# Patient Record
Sex: Female | Born: 1940 | Race: White | Hispanic: No | Marital: Married | Smoking: Former smoker
Health system: Southern US, Community
[De-identification: ages and names within clinical notes are randomized; demographics above are authoritative.]

## PROBLEM LIST (undated history)

## (undated) DIAGNOSIS — T451X5A Adverse effect of antineoplastic and immunosuppressive drugs, initial encounter: Secondary | ICD-10-CM

## (undated) DIAGNOSIS — F0390 Unspecified dementia without behavioral disturbance: Secondary | ICD-10-CM

## (undated) DIAGNOSIS — F329 Major depressive disorder, single episode, unspecified: Secondary | ICD-10-CM

## (undated) DIAGNOSIS — R0609 Other forms of dyspnea: Secondary | ICD-10-CM

## (undated) DIAGNOSIS — J302 Other seasonal allergic rhinitis: Secondary | ICD-10-CM

## (undated) DIAGNOSIS — F32A Depression, unspecified: Secondary | ICD-10-CM

## (undated) DIAGNOSIS — D649 Anemia, unspecified: Secondary | ICD-10-CM

## (undated) DIAGNOSIS — E611 Iron deficiency: Secondary | ICD-10-CM

## (undated) DIAGNOSIS — E119 Type 2 diabetes mellitus without complications: Secondary | ICD-10-CM

## (undated) DIAGNOSIS — Z973 Presence of spectacles and contact lenses: Secondary | ICD-10-CM

## (undated) DIAGNOSIS — C801 Malignant (primary) neoplasm, unspecified: Secondary | ICD-10-CM

## (undated) DIAGNOSIS — M1990 Unspecified osteoarthritis, unspecified site: Secondary | ICD-10-CM

## (undated) DIAGNOSIS — I219 Acute myocardial infarction, unspecified: Secondary | ICD-10-CM

## (undated) DIAGNOSIS — Z974 Presence of external hearing-aid: Secondary | ICD-10-CM

## (undated) DIAGNOSIS — H3530 Unspecified macular degeneration: Secondary | ICD-10-CM

## (undated) DIAGNOSIS — I10 Essential (primary) hypertension: Secondary | ICD-10-CM

## (undated) DIAGNOSIS — I7090 Unspecified atherosclerosis: Secondary | ICD-10-CM

## (undated) DIAGNOSIS — G620 Drug-induced polyneuropathy: Secondary | ICD-10-CM

## (undated) DIAGNOSIS — N3946 Mixed incontinence: Secondary | ICD-10-CM

## (undated) DIAGNOSIS — Z794 Long term (current) use of insulin: Secondary | ICD-10-CM

## (undated) DIAGNOSIS — Z862 Personal history of diseases of the blood and blood-forming organs and certain disorders involving the immune mechanism: Secondary | ICD-10-CM

## (undated) DIAGNOSIS — G629 Polyneuropathy, unspecified: Secondary | ICD-10-CM

## (undated) DIAGNOSIS — E785 Hyperlipidemia, unspecified: Secondary | ICD-10-CM

## (undated) DIAGNOSIS — F39 Unspecified mood [affective] disorder: Secondary | ICD-10-CM

## (undated) DIAGNOSIS — Z86018 Personal history of other benign neoplasm: Secondary | ICD-10-CM

## (undated) DIAGNOSIS — Z9889 Other specified postprocedural states: Secondary | ICD-10-CM

## (undated) DIAGNOSIS — J45909 Unspecified asthma, uncomplicated: Secondary | ICD-10-CM

## (undated) DIAGNOSIS — N3281 Overactive bladder: Secondary | ICD-10-CM

## (undated) DIAGNOSIS — C50919 Malignant neoplasm of unspecified site of unspecified female breast: Secondary | ICD-10-CM

## (undated) DIAGNOSIS — K219 Gastro-esophageal reflux disease without esophagitis: Secondary | ICD-10-CM

## (undated) DIAGNOSIS — R0600 Dyspnea, unspecified: Secondary | ICD-10-CM

## (undated) DIAGNOSIS — I2510 Atherosclerotic heart disease of native coronary artery without angina pectoris: Secondary | ICD-10-CM

## (undated) HISTORY — DX: Type 2 diabetes mellitus without complications: E11.9

## (undated) HISTORY — DX: Other specified postprocedural states: Z98.890

## (undated) HISTORY — DX: Depression, unspecified: F32.A

## (undated) HISTORY — DX: Hyperlipidemia, unspecified: E78.5

## (undated) HISTORY — DX: Gastro-esophageal reflux disease without esophagitis: K21.9

## (undated) HISTORY — DX: Major depressive disorder, single episode, unspecified: F32.9

## (undated) HISTORY — DX: Atherosclerotic heart disease of native coronary artery without angina pectoris: I25.10

## (undated) HISTORY — DX: Acute myocardial infarction, unspecified: I21.9

## (undated) HISTORY — DX: Essential (primary) hypertension: I10

## (undated) HISTORY — DX: Other forms of dyspnea: R06.09

## (undated) HISTORY — DX: Iron deficiency: E61.1

## (undated) HISTORY — DX: Unspecified atherosclerosis: I70.90

## (undated) HISTORY — DX: Malignant neoplasm of unspecified site of unspecified female breast: C50.919

---

## 1975-09-07 HISTORY — PX: TUBAL LIGATION: SHX77

## 1993-09-06 HISTORY — PX: EXCISION OF BREAST BIOPSY: SHX5822

## 1993-09-06 HISTORY — PX: BREAST SURGERY: SHX581

## 1995-09-07 DIAGNOSIS — Z853 Personal history of malignant neoplasm of breast: Secondary | ICD-10-CM

## 1995-09-07 DIAGNOSIS — Z923 Personal history of irradiation: Secondary | ICD-10-CM

## 1995-09-07 DIAGNOSIS — C50919 Malignant neoplasm of unspecified site of unspecified female breast: Secondary | ICD-10-CM

## 1995-09-07 DIAGNOSIS — Z9221 Personal history of antineoplastic chemotherapy: Secondary | ICD-10-CM

## 1995-09-07 HISTORY — PX: MASTECTOMY: SHX3

## 1995-09-07 HISTORY — DX: Personal history of irradiation: Z92.3

## 1995-09-07 HISTORY — PX: MASTECTOMY PARTIAL / LUMPECTOMY W/ AXILLARY LYMPHADENECTOMY: SUR852

## 1995-09-07 HISTORY — DX: Malignant neoplasm of unspecified site of unspecified female breast: C50.919

## 1995-09-07 HISTORY — DX: Personal history of antineoplastic chemotherapy: Z92.21

## 1995-09-07 HISTORY — DX: Personal history of malignant neoplasm of breast: Z85.3

## 1999-08-17 ENCOUNTER — Encounter: Admission: RE | Admit: 1999-08-17 | Discharge: 1999-08-17 | Payer: Self-pay | Admitting: Hematology and Oncology

## 1999-08-17 ENCOUNTER — Encounter: Payer: Self-pay | Admitting: Hematology and Oncology

## 2000-02-05 ENCOUNTER — Encounter: Payer: Self-pay | Admitting: Hematology and Oncology

## 2000-02-05 ENCOUNTER — Encounter: Admission: RE | Admit: 2000-02-05 | Discharge: 2000-02-05 | Payer: Self-pay | Admitting: Hematology and Oncology

## 2000-08-03 ENCOUNTER — Ambulatory Visit (HOSPITAL_COMMUNITY): Admission: RE | Admit: 2000-08-03 | Discharge: 2000-08-03 | Payer: Self-pay | Admitting: Gastroenterology

## 2000-08-12 ENCOUNTER — Encounter: Payer: Self-pay | Admitting: Hematology and Oncology

## 2000-08-12 ENCOUNTER — Encounter: Admission: RE | Admit: 2000-08-12 | Discharge: 2000-08-12 | Payer: Self-pay | Admitting: Hematology and Oncology

## 2000-08-15 ENCOUNTER — Encounter: Payer: Self-pay | Admitting: Hematology and Oncology

## 2000-08-15 ENCOUNTER — Encounter: Admission: RE | Admit: 2000-08-15 | Discharge: 2000-08-15 | Payer: Self-pay | Admitting: Hematology and Oncology

## 2001-08-23 ENCOUNTER — Encounter: Payer: Self-pay | Admitting: Hematology and Oncology

## 2001-08-23 ENCOUNTER — Encounter: Admission: RE | Admit: 2001-08-23 | Discharge: 2001-08-23 | Payer: Self-pay | Admitting: Hematology and Oncology

## 2002-04-03 ENCOUNTER — Ambulatory Visit (HOSPITAL_COMMUNITY): Admission: RE | Admit: 2002-04-03 | Discharge: 2002-04-03 | Payer: Self-pay | Admitting: Internal Medicine

## 2002-08-27 ENCOUNTER — Encounter: Admission: RE | Admit: 2002-08-27 | Discharge: 2002-08-27 | Payer: Self-pay | Admitting: Internal Medicine

## 2002-08-27 ENCOUNTER — Encounter: Payer: Self-pay | Admitting: Internal Medicine

## 2004-05-29 ENCOUNTER — Encounter: Admission: RE | Admit: 2004-05-29 | Discharge: 2004-05-29 | Payer: Self-pay | Admitting: Internal Medicine

## 2004-05-29 ENCOUNTER — Encounter (INDEPENDENT_AMBULATORY_CARE_PROVIDER_SITE_OTHER): Payer: Self-pay | Admitting: *Deleted

## 2005-07-28 ENCOUNTER — Encounter: Admission: RE | Admit: 2005-07-28 | Discharge: 2005-07-28 | Payer: Self-pay | Admitting: Surgery

## 2005-07-28 ENCOUNTER — Encounter (INDEPENDENT_AMBULATORY_CARE_PROVIDER_SITE_OTHER): Payer: Self-pay | Admitting: Specialist

## 2006-02-16 ENCOUNTER — Encounter: Admission: RE | Admit: 2006-02-16 | Discharge: 2006-02-16 | Payer: Self-pay | Admitting: Internal Medicine

## 2006-06-15 ENCOUNTER — Encounter: Admission: RE | Admit: 2006-06-15 | Discharge: 2006-06-15 | Payer: Self-pay | Admitting: Internal Medicine

## 2008-09-06 DIAGNOSIS — I219 Acute myocardial infarction, unspecified: Secondary | ICD-10-CM

## 2008-09-06 HISTORY — DX: Acute myocardial infarction, unspecified: I21.9

## 2008-12-05 DIAGNOSIS — I2510 Atherosclerotic heart disease of native coronary artery without angina pectoris: Secondary | ICD-10-CM

## 2008-12-05 HISTORY — DX: Atherosclerotic heart disease of native coronary artery without angina pectoris: I25.10

## 2008-12-14 ENCOUNTER — Inpatient Hospital Stay (HOSPITAL_COMMUNITY): Admission: EM | Admit: 2008-12-14 | Discharge: 2008-12-18 | Payer: Self-pay | Admitting: Cardiovascular Disease

## 2008-12-14 ENCOUNTER — Encounter: Payer: Self-pay | Admitting: Emergency Medicine

## 2008-12-14 DIAGNOSIS — I219 Acute myocardial infarction, unspecified: Secondary | ICD-10-CM

## 2008-12-14 DIAGNOSIS — I252 Old myocardial infarction: Secondary | ICD-10-CM

## 2008-12-14 HISTORY — DX: Old myocardial infarction: I25.2

## 2008-12-14 HISTORY — DX: Acute myocardial infarction, unspecified: I21.9

## 2008-12-16 DIAGNOSIS — Z9889 Other specified postprocedural states: Secondary | ICD-10-CM

## 2008-12-16 DIAGNOSIS — I7090 Unspecified atherosclerosis: Secondary | ICD-10-CM

## 2008-12-16 DIAGNOSIS — Z955 Presence of coronary angioplasty implant and graft: Secondary | ICD-10-CM

## 2008-12-16 HISTORY — PX: CORONARY ANGIOPLASTY WITH STENT PLACEMENT: SHX49

## 2008-12-16 HISTORY — DX: Other specified postprocedural states: Z98.890

## 2008-12-16 HISTORY — DX: Presence of coronary angioplasty implant and graft: Z95.5

## 2008-12-16 HISTORY — DX: Unspecified atherosclerosis: I70.90

## 2009-01-02 ENCOUNTER — Encounter (HOSPITAL_COMMUNITY): Admission: RE | Admit: 2009-01-02 | Discharge: 2009-04-02 | Payer: Self-pay | Admitting: Cardiovascular Disease

## 2009-04-03 ENCOUNTER — Encounter (HOSPITAL_COMMUNITY): Admission: RE | Admit: 2009-04-03 | Discharge: 2009-05-23 | Payer: Self-pay | Admitting: Cardiovascular Disease

## 2009-08-13 DIAGNOSIS — I10 Essential (primary) hypertension: Secondary | ICD-10-CM | POA: Insufficient documentation

## 2010-09-26 ENCOUNTER — Encounter: Payer: Self-pay | Admitting: Internal Medicine

## 2010-12-13 LAB — GLUCOSE, CAPILLARY
Glucose-Capillary: 110 mg/dL — ABNORMAL HIGH (ref 70–99)
Glucose-Capillary: 135 mg/dL — ABNORMAL HIGH (ref 70–99)
Glucose-Capillary: 68 mg/dL — ABNORMAL LOW (ref 70–99)
Glucose-Capillary: 92 mg/dL (ref 70–99)

## 2010-12-14 LAB — GLUCOSE, CAPILLARY: Glucose-Capillary: 122 mg/dL — ABNORMAL HIGH (ref 70–99)

## 2010-12-15 LAB — GLUCOSE, CAPILLARY
Glucose-Capillary: 104 mg/dL — ABNORMAL HIGH (ref 70–99)
Glucose-Capillary: 125 mg/dL — ABNORMAL HIGH (ref 70–99)
Glucose-Capillary: 133 mg/dL — ABNORMAL HIGH (ref 70–99)
Glucose-Capillary: 145 mg/dL — ABNORMAL HIGH (ref 70–99)
Glucose-Capillary: 149 mg/dL — ABNORMAL HIGH (ref 70–99)
Glucose-Capillary: 68 mg/dL — ABNORMAL LOW (ref 70–99)
Glucose-Capillary: 75 mg/dL (ref 70–99)
Glucose-Capillary: 83 mg/dL (ref 70–99)
Glucose-Capillary: 87 mg/dL (ref 70–99)
Glucose-Capillary: 103 mg/dL — ABNORMAL HIGH (ref 70–99)
Glucose-Capillary: 105 mg/dL — ABNORMAL HIGH (ref 70–99)
Glucose-Capillary: 119 mg/dL — ABNORMAL HIGH (ref 70–99)
Glucose-Capillary: 66 mg/dL — ABNORMAL LOW (ref 70–99)
Glucose-Capillary: 67 mg/dL — ABNORMAL LOW (ref 70–99)
Glucose-Capillary: 75 mg/dL (ref 70–99)
Glucose-Capillary: 84 mg/dL (ref 70–99)

## 2010-12-16 LAB — BASIC METABOLIC PANEL WITH GFR
BUN: 13 mg/dL (ref 6–23)
CO2: 24 meq/L (ref 19–32)
Calcium: 8.9 mg/dL (ref 8.4–10.5)
Chloride: 101 meq/L (ref 96–112)
Creatinine, Ser: 0.66 mg/dL (ref 0.4–1.2)
GFR calc non Af Amer: >60 mL/min
Glucose, Bld: 155 mg/dL — ABNORMAL HIGH (ref 70–99)
Potassium: 3.7 meq/L (ref 3.5–5.1)
Sodium: 134 meq/L — ABNORMAL LOW (ref 135–145)

## 2010-12-16 LAB — BASIC METABOLIC PANEL
BUN: 14 mg/dL (ref 6–23)
BUN: 8 mg/dL (ref 6–23)
CO2: 24 mEq/L (ref 19–32)
Calcium: 8.9 mg/dL (ref 8.4–10.5)
Calcium: 9 mg/dL (ref 8.4–10.5)
Calcium: 9.3 mg/dL (ref 8.4–10.5)
Chloride: 100 mEq/L (ref 96–112)
Chloride: 103 mEq/L (ref 96–112)
Creatinine, Ser: 0.58 mg/dL (ref 0.4–1.2)
Creatinine, Ser: 0.71 mg/dL (ref 0.4–1.2)
GFR calc Af Amer: >60 mL/min (ref >60)
GFR calc Af Amer: >60 mL/min (ref >60)
GFR calc non Af Amer: >60 mL/min (ref >60)
GFR calc non Af Amer: >60 mL/min (ref >60)
Glucose, Bld: 169 mg/dL — ABNORMAL HIGH (ref 70–99)
Glucose, Bld: 186 mg/dL — ABNORMAL HIGH (ref 70–99)
Potassium: 3.5 mEq/L (ref 3.5–5.1)
Potassium: 4 mEq/L (ref 3.5–5.1)
Sodium: 135 mEq/L (ref 135–145)

## 2010-12-16 LAB — GLUCOSE, CAPILLARY
Glucose-Capillary: 154 mg/dL — ABNORMAL HIGH (ref 70–99)
Glucose-Capillary: 160 mg/dL — ABNORMAL HIGH (ref 70–99)
Glucose-Capillary: 165 mg/dL — ABNORMAL HIGH (ref 70–99)
Glucose-Capillary: 169 mg/dL — ABNORMAL HIGH (ref 70–99)
Glucose-Capillary: 181 mg/dL — ABNORMAL HIGH (ref 70–99)
Glucose-Capillary: 241 mg/dL — ABNORMAL HIGH (ref 70–99)

## 2010-12-16 LAB — URINALYSIS, ROUTINE W REFLEX MICROSCOPIC
Bilirubin Urine: NEGATIVE
Hgb urine dipstick: NEGATIVE
Protein, ur: NEGATIVE mg/dL
Urobilinogen, UA: 0.2 mg/dL (ref 0.0–1.0)

## 2010-12-16 LAB — POCT I-STAT, CHEM 8
Creatinine, Ser: 0.6 mg/dL (ref 0.4–1.2)
Glucose, Bld: 204 mg/dL — ABNORMAL HIGH (ref 70–99)
Hemoglobin: 12.2 g/dL (ref 12.0–15.0)
Sodium: 137 mEq/L (ref 135–145)
TCO2: 16 mmol/L (ref 0–100)

## 2010-12-16 LAB — PROTIME-INR
INR: 0.9 (ref 0.00–1.49)
Prothrombin Time: 12.1 seconds (ref 11.6–15.2)

## 2010-12-16 LAB — DIFFERENTIAL
Basophils Absolute: 0.1 10*3/uL (ref 0.0–0.1)
Eosinophils Relative: 8 % — ABNORMAL HIGH (ref 0–5)
Lymphocytes Relative: 38 % (ref 12–46)
Neutrophils Relative %: 43 % (ref 43–77)

## 2010-12-16 LAB — CBC
HCT: 31.9 % — ABNORMAL LOW (ref 36.0–46.0)
HCT: 33.2 % — ABNORMAL LOW (ref 36.0–46.0)
HCT: 36.7 % (ref 36.0–46.0)
Hemoglobin: 10.6 g/dL — ABNORMAL LOW (ref 12.0–15.0)
MCHC: 29.9 g/dL — ABNORMAL LOW (ref 30.0–36.0)
MCHC: 32.9 g/dL (ref 30.0–36.0)
MCV: 75.8 fL — ABNORMAL LOW (ref 78.0–100.0)
MCV: 76.1 fL — ABNORMAL LOW (ref 78.0–100.0)
MCV: 76.3 fL — ABNORMAL LOW (ref 78.0–100.0)
MCV: 76.5 fL — ABNORMAL LOW (ref 78.0–100.0)
Platelets: 321 10*3/uL (ref 150–400)
Platelets: 323 10*3/uL (ref 150–400)
Platelets: 336 10*3/uL (ref 150–400)
Platelets: 388 10*3/uL (ref 150–400)
RBC: 4.03 MIL/uL (ref 3.87–5.11)
RDW: 16.8 % — ABNORMAL HIGH (ref 11.5–15.5)
RDW: 16.9 % — ABNORMAL HIGH (ref 11.5–15.5)
RDW: 17 % — ABNORMAL HIGH (ref 11.5–15.5)
WBC: 10.2 10*3/uL (ref 4.0–10.5)
WBC: 12 10*3/uL — ABNORMAL HIGH (ref 4.0–10.5)
WBC: 7.9 10*3/uL (ref 4.0–10.5)

## 2010-12-16 LAB — CARDIAC PANEL(CRET KIN+CKTOT+MB+TROPI)
CK, MB: 27.3 ng/mL — ABNORMAL HIGH (ref 0.3–4.0)
CK, MB: 54.8 ng/mL — ABNORMAL HIGH (ref 0.3–4.0)
CK, MB: 57.2 ng/mL — ABNORMAL HIGH (ref 0.3–4.0)
Relative Index: 11.8 — ABNORMAL HIGH (ref 0.0–2.5)
Relative Index: 11.9 — ABNORMAL HIGH (ref 0.0–2.5)
Relative Index: 6.6 — ABNORMAL HIGH (ref 0.0–2.5)
Total CK: 485 U/L — ABNORMAL HIGH (ref 7–177)
Total CK: 486 U/L — ABNORMAL HIGH (ref 7–177)
Total CK: 564 U/L — ABNORMAL HIGH (ref 7–177)
Troponin I: 6.05 ng/mL (ref 0.00–0.06)
Troponin I: 6.23 ng/mL (ref 0.00–0.06)

## 2010-12-16 LAB — D-DIMER, QUANTITATIVE

## 2010-12-16 LAB — POCT CARDIAC MARKERS
CKMB, poc: 13.3 ng/mL (ref 1.0–8.0)
Myoglobin, poc: 67.6 ng/mL (ref 12–200)
Troponin i, poc: 0.39 ng/mL (ref 0.00–0.09)

## 2010-12-16 LAB — HEPARIN LEVEL (UNFRACTIONATED)
Heparin Unfractionated: 0.25 IU/mL — ABNORMAL LOW (ref 0.30–0.70)
Heparin Unfractionated: 0.35 IU/mL (ref 0.30–0.70)

## 2010-12-16 LAB — IRON AND TIBC
Iron: 60 ug/dL (ref 42–135)
TIBC: 475 ug/dL — ABNORMAL HIGH (ref 250–470)
UIBC: 415 ug/dL

## 2010-12-16 LAB — LIPID PANEL: HDL: 70 mg/dL (ref >39)

## 2010-12-16 LAB — FERRITIN: Ferritin: 45 ng/mL (ref 10–291)

## 2010-12-16 LAB — MAGNESIUM: Magnesium: 2.2 mg/dL (ref 1.5–2.5)

## 2011-01-19 NOTE — Cardiovascular Report (Signed)
NAMELECRETIA, Karina Foster                ACCOUNT NO.:  [id number]   MEDICAL RECORD NO.:  [id number]          PATIENT TYPE:  INP   LOCATION:  2508                         FACILITY:  [facility]   PHYSICIAN:  Nanetta Batty, M.D.   DATE OF BIRTH:  July 12, 1941   DATE OF PROCEDURE:  12/16/2008  DATE OF DISCHARGE:                            CARDIAC CATHETERIZATION   INDICATIONS FOR PROCEDURE:  Ms. Gibbs is a 70 year old widowed white  female mother of one who worked for [employer] for many  years.  She had no prior cardiac history and her risk factors including  hypertension and non-insulin requiring diabetes.  She does has a remote  history of breast cancer.  She developed substernal chest pain on the  day of admission at 1:00 p.m. with nausea and bilateral upper extremity  radiation.  She presented to [facility] ER where her pain was 8/10.  She had no ST-segment changes.  She was treated with IV heparin,  Integrillin, nitroglycerin, and morphine.  Her pain resolved and she did  evolve enzymes.  She was transferred to [facility] and placed on the  appropriate medications including statin and beta blocker.  She presents  now for elective diagnostic coronary arteriography to define her  anatomy.   DESCRIPTION OF PROCEDURE:  The patient brought to the Second [facility] in the postabsorptive state.  She was premedicated  with p.o. Valium, IV Versed, and fentanyl.  Her right groin was prepped  and shaved in the usual sterile fashion.  Xylocaine 1% was used for  local anesthesia.  A 6-Domingo sheath was inserted into the right femoral  artery using standard Seldinger technique.  A 6-Porrata right-left  Judkins diagnostic catheters along with a 6-Gethers pigtail catheter were  used for selective coronary angiography, left ventriculography,  subselective left internal mammary artery angiography, and distal  abdominal aortography.  Visipaque dye was used for the entirety of the  case.  Thoracic aorta, left ventricular, and pullback pressures were  recorded.   HEMODYNAMIC RESULTS:  1. Aortic systolic pressure 81, diastolic pressure 46.  2. Left ventricular systolic pressure 81, end-diastolic pressure 14.   It should be noted that with this low systolic pressure, the patient was  began on IV fluid at 150 mL an hour of normal saline combined.   ANGIOGRAPHIC RESULTS:  1. Left main normal.  2. LAD; LAD had a 90% hazy-appearing proximal lesion encompassing the      first small diagonal branch which itself had high-grade disease.      The second diagonal branch was a larger vessel and then involved in      the stenosis.  There was an 80% segmental lesion in the middle      third of the vessel after the third diagonal branch that appeared      hazy as well, for the LAD was a large vessel that wrapped the apex.  3. Left circumflex; nondominant with a 30% segmental mid stenosis in      the AV groove and in the middle and distal third of the vessel.  4. Right coronary; dominant vessel with a fairly focal 40% stenosis in      the distal vessel at the crux.  5. Left ventriculography; RAO left ventriculogram was performed using      25 mL of Visipaque dye at 12 mL per second.  The overall LVEF was      estimated at approximately 50% with moderately severe anteroapical      and inferoapical hypokinesia.  6. Left internal mammary artery; this vessel was subselectively      visualized and was widely patent.  It was suitable for use during      coronary artery bypass grafting if required.  7. Distal abdominal aortography; distal abdominal aortogram was      performed using 20 mL of Visipaque dye at 20 mL per second.  The      renal arteries were widely patent.  The infrarenal abdominal aorta      and iliac bifurcation appeared free of significant and      atherosclerotic changes and was suitable for intra-aortic balloon      counter pulsation if necessary.   IMPRESSION:   High-grade proximal mid left anterior descending disease  with a corresponding wall motion abnormality.  The patient will proceed  with percutaneous coronary intervention stenting using heparin,  Integrillin, and drug-eluting stent.   The existing 6-Winders sheath in the right femoral artery was exchanged  over wire for a 7-Umeda sheath.  The patient received 4 baby aspirin,  Plavix 600 p.o., and Pepcid 20 mg IV along with 3500 units of heparin  with an ending ACT of 262.  She was started on an Integrillin drip.  Visipaque dye was used for the entirety of the case.  Retrograde aortic  pressures were monitored throughout the case.   Using a 7-Mis XB LAD 3.5 guide catheter along with for 0.014 x 190  Asahi soft wire and a 2.5 angioscoped atherectomy was performed in the  proximal lesion at nominal pressures.  Following this, a 2.0 x 12 apex  was used to predilate the mid stenosis after the D3.  A 2.25 x 16 Taxus  Adam drug-eluting stent was then carefully positioned in the mid-LAD  after D3 encompassing the entirety of that lesion and deployed at 14-16  atmospheres.  It was then postdilated with a 2.5 x 15 [place] Voyager at 16  atmospheres (2.7 mm) resulting reduction of 80% stenosis to 0% residual.   Following this, a 3.0 x 12 Xience drug-eluting stent was then carefully  positioned across the first diagonal branch encompassing the previously  atherectomized lesion and deployed at 12 atmospheres.  This was then  postdilated with a 3.25 x 10 Cordis Dura Star post-inflation balloon at  16 atmospheres (3.3 mm) resulting reduction of 90% hazy-appearing  lesion to 0% residual.  Of note, the first diagonal branch was lost  during the stent deployment, however, this was a small vessel.   IMPRESSION:  Successful proximal mid left anterior descending  atherectomy, percutaneous coronary intervention and stenting using Taxus  Adam drug-eluting stent in the midportion and Xience V Abbott drug-   eluting stent in the proximal portion.  The patient tolerated the  procedure well.  She had minimal pain at the end of the procedure  probably related to loss of that small diagonal branch but she was  hemodynamically stable.  Blood pressure did come up with IV fluid  resuscitation.  The guidewire and catheter were removed and sheath was  sewn securely in  place.  The patient left the lab in stable condition.  She will be hydrated overnight.  The heparin was turned off.  The sheath  will be removed once the ACT falls below 170.  Integrillin will be  continued for 18 hours.  She left the lab in stable condition.      Nanetta Batty, M.D.  Electronically Signed     JB/MEDQ  D:  12/16/2008  T:  12/17/2008  Job:  [id number]   cc:   [facility]  [facility]  Larina Earthly, M.D.

## 2011-01-19 NOTE — H&P (Signed)
Karina Foster, Karina Foster                ACCOUNT NO.:  [id number]   MEDICAL RECORD NO.:  [id number]          PATIENT TYPE:  EMS   LOCATION:  ED                           FACILITY:  [facility]   PHYSICIAN:  Nanetta Batty, M.D.   DATE OF BIRTH:  Dec 24, 1940   DATE OF ADMISSION:  12/14/2008  DATE OF DISCHARGE:                              HISTORY & PHYSICAL   CHIEF COMPLAINT:  Chest pain.   HISTORY OF PRESENT ILLNESS:  Karina Foster is a pleasant 70 year old female  who is followed by Dr. Felipa Eth.  She has history of non-insulin-dependent  diabetes and hypertension.  She had seen Dr. Deborah Chalk several years ago  and had an echocardiogram.  She is admitted to the Emergency Room at  [facility] today after having an episode of sudden substernal  chest pressure and bilateral arm pain while making her bed.  She said  this was associated with some nausea and diaphoresis.  In the emergency  room, she continued to have some symptoms of chest discomfort.  This  improved with IV Lopressor, nitrates and oxygen.  She will be ruled out  for a pulmonary embolism.  If this is negative, she will be transferred  to [facility] as unstable angina.  If her symptoms do not improve or  her EKG shows signs of ST elevation, Dr. Allyson Sabal will take her to the  catheterization lab today.   PAST MEDICAL HISTORY:  Remarkable for what is noted above for non-  insulin-dependent diabetes.  She has a history of depression and is on  Celexa.  She has had previous right lumpectomy for breast cancer.   HOME MEDICINES:  1. Byetta 10 mg b.i.d.  2. Metformin 500 mg in the morning, 1 gram in the evening.  3. Celexa 40 mg a day.  4. Prilosec 20 mg a day.  5. Diltiazem 180 mg a day.   MEDICATION INTOLERANCE LIST:  She is intolerant to Demerol and  erythromycin.   SOCIAL HISTORY:  She is a nonsmoker.   REVIEW OF SYSTEMS:  Essentially unremarkable except as noted above.   PHYSICAL EXAMINATION:  VITAL SIGNS:  Blood pressure  137/87, pulse 92, O2  saturation 99%.  IN GENERAL:  She is a well-developed, well-nourished female in no acute  distress.  HEENT:  Normocephalic.  Extraocular movements are intact.  Sclerae are  anicteric.  __________ and conjunctivae are within normal limits.  NECK:  Without JVD or bruit.  CARDIAC:  Reveals regular rate and rhythm without murmur, rub, or  gallop.  Normal S1 and S2.  ABDOMEN:  Nontender, no hepatosplenomegaly.  EXTREMITIES:  Without edema.  NEURO:  Grossly intact.  SKIN:  Cool and moist.   IMPRESSION:  1. Unstable angina.  2. Non-insulin-dependent diabetes.  3. Treated hypertension.  4. History of breast cancer, status post lumpectomy in the past.   PLAN:  Patient will be started on beta-blocker and given aspirin and  Lopressor.  We have also started IV nitrates.  We will await her labs.  Her EKG initially does not show acute ST elevation or ischemic changes.  ADDENDUM:  Karina Foster first troponin is positive at 0.39.  Dr. Allyson Sabal  was made aware.      Abelino Derrick, P.A.      Nanetta Batty, M.D.  Electronically Signed    LKK/MEDQ  D:  12/14/2008  T:  12/14/2008  Job:  [id number]

## 2011-01-19 NOTE — Discharge Summary (Signed)
Karina Foster, Karina Foster                ACCOUNT NO.:  [id number]   MEDICAL RECORD NO.:  [id number]          PATIENT TYPE:  INP   LOCATION:  3715                         FACILITY:  [facility]   PHYSICIAN:  Nanetta Batty, M.D.   DATE OF BIRTH:  07-19-1941   DATE OF ADMISSION:  12/14/2008  DATE OF DISCHARGE:  12/18/2008                               DISCHARGE SUMMARY   DISCHARGE DIAGNOSES:  1. Non-ST-elevation myocardial infraction.  2. Coronary artery disease, underwent percutaneous transluminal      coronary angioplasty and stent deployment to the left anterior      descending, proximal and mid with drug-eluding stents, proximal      lesion with Xience stent and Taxus for the mid.      a.     Continued coronary disease with 30% circ and 40% right       carotid artery.  3. Moderate hypokinesis with ejection fraction of 50%.  4. Gastroesophageal reflux disease.  5. Continued chest pain, improve to discharge.  6. Abnormal electrocardiogram even at discharge.  7. Dyslipidemia.  8. Diabetes mellitus type 2.   DISCHARGE MEDICATIONS:  1. Byetta 10 mg twice a day, injected.  2. Metformin 500 mg every morning.  3. Metformin 1000 mg every evening, do not take either metformin until      December 19, 2008, then she may resume.  4. Celexa 40 mg daily.  5. Diltiazem was stopped.  6. Prilosec and omeprazole was stopped.  7. Advil 600 mg.  She may take as needed 600 mg do not take before the      aspirin and do not take for at least 1 hour after the aspirin.  8. Vitamin B12 one daily.  9. Lopressor 50 mg one twice a day.  10.Zocor 40 mg every evening.  11.Nitroglycerin 1/150 one under the tongue as needed for chest pain,      1 every 5 minutes up to 3/15 minutes while sitting, if no relief,      go to ER.  12.Plavix 75 mg one daily, do not stop, stopping can cause a heart      attack.  13.Enteric-coated aspirin 325 mg one daily for one month then decrease      to 2 baby aspirin daily.  14.Protonix  40 mg one twice a day at noon and 10 p.m.  15.Altace 2.5 mg daily.   DISCHARGE INSTRUCTIONS:  1. No work until she sees Dr. Allyson Sabal.  2. Low-sodium heart-healthy diabetic diet.  3. Wash cath site with soap and water.  Call if any bleeding,      swelling, or drainage.  4. Increase activity slowly.  5. May shower.  No lifting for 2 weeks.  No driving for 1 week.  6. Follow with Dr. Allyson Sabal on December 24, 2008, at 11:30 a.m.  7. Follow with Dr. Onalee Hua as instructed.   HISTORY OF PRESENT ILLNESS:  The patient was originally seen at [facility] Emergency Room, where she presented with chest pain.  Concern for  pulmonary embolus, but her D-dimer was negative.  She did have a  positive troponin I and was admitted and placed on IV heparin and  nitroglycerin and then underwent cardiac catheterization, revealing 90%  stenosis in 2 sides in the LAD, undergoing drug-eluting stents.   The patient is to continue with episodic chest pain despite 2b/3a,  heparin, nitroglycerin, but the cardiac enzymes to titrate down and she  did stabilize and by December 18, 2008, she was ready for discharge home.   LABORATORY DATA:  Chemistry:  Initially, sodium 135, potassium 3.5,  chloride 101, CO2 of 16, glucose 186, BUN 14, creatinine 0.71.  At  discharge, this was stable with sodium 140, potassium 3.6, chloride 103,  CO2 of 28, glucose 447, BUN 6, creatinine 0.58.   Cardiac enzymes initially were positive.  Initial marker, troponin was  0.39 with an MB of 13.3.  Follow up with CK was 46, MB 54.8, and  troponin 4.91.  Continued numbers CK 485, MB 57, and troponin 5.34 and  then troponin 6.05, MB of 75 and that was the peak.   CBC at discharge, hemoglobin 10, hematocrit 30.7, platelets 288, WBC  7.9.  On admission, hemoglobin 11.8, hematocrit 36.   UA:  She was spilling glucose at 215 mg/dL, ketones greater than 80.   Magnesium 3.2.  BNP was 74 on admission.   Lipid panel:  Total cholesterol 212, triglycerides  126, HDL 70, LDL 117.   D-dimer is 0.22, TSH 1.618, iron was 60, TIBC 475, iron percent  saturation 13, UIBC 145.   B12 level was 71, ferritin 45, folate was 8.4.   Chest x-ray:  No acute thoracic findings were identified.   Vital sings at discharge, blood pressure 132/80, respiratory 18, pulse  85, temperature 97.4, oxygen saturation 98%.   The patient was admitted, stabilized, and was discharged home.  We will  follow with Dr. Allyson Sabal.      Darcella Gasman. Annie Paras, N.P.      Nanetta Batty, M.D.  Electronically Signed    LRI/MEDQ  D:  12/18/2008  T:  12/19/2008  Job:  [id number]   cc:   Nanetta Batty, M.D.  Oretha Milch, MD

## 2011-01-22 NOTE — Procedures (Signed)
[name]  Patient:    Karina Foster, Karina Foster                       MRN: [id number] Proc. Date: 08/03/00 Adm. Date:  [id number] Attending:  Charna Elizabeth                           Procedure Report  DATE OF BIRTH:  1941-01-20.  REFERRING PHYSICIAN:  Ravi R. Felipa Eth, M.D.  PROCEDURE PERFORMED:  Colonoscopy.  ENDOSCOPIST:  Anselmo Rod, M.D.  INSTRUMENT USED:  Olympus video colonoscope.  INDICATIONS FOR PROCEDURE:  Rectal bleeding with personal history of breast cancer.  The patient is status post mastectomy in the right side, rule out colonic polyps, masses, hemorrhoids, etc.  PREPROCEDURE PREPARATION:  Informed consent was procured from the patient. The patient was fasted for eight hours prior to the procedure and prepped with a bottle of magnesium citrate and a gallon of NuLytely the night prior to the procedure.  PREPROCEDURE PHYSICAL:  The patient had stable vital signs.  Neck supple. Chest clear to auscultation.  S1, S2 regular.  No murmur, rub or gallop.  No rales, rhonchi or wheezing.  The patient has had a mastectomy on the right side.  Abdomen soft with normal abdominal bowel sounds.  DESCRIPTION OF PROCEDURE:  The patient was placed in the left lateral decubitus position and sedated with 75 mcg of fentanyl and 7.5 mg of Versed intravenously.  Once the patient was adequately sedated and maintained on low-flow oxygen and continuous cardiac monitoring, the Olympus video colonoscope was advanced from the rectum to the cecum with difficulty secondary to a very tortuous colon.  The patient had to be changed from the left lateral to the right lateral and supine position to facilitate entry of the scope into the cecum.  The appendicular orifice and the ileocecal valve were clearly visualized.  There was no evidence of diverticulosis, masses or polyps.  A small internal hemorrhoid was recognized on retroflexion in the rectum.  IMPRESSION:   Essentially healthy colon except for small nonbleeding internal hemorrhoids.  Procedure completed up to the cecum.  RECOMMENDATIONS: 1. The patient has been advised to increase the fluid and fiber in her    diet. 2. Repeat colorectal cancer screening has been recommended in the next five    to years or earlier if she were to develop any abnormal symptoms in the    interim. DD:  08/03/00 TD:  08/03/00 Job: [id number]

## 2011-09-13 DIAGNOSIS — E785 Hyperlipidemia, unspecified: Secondary | ICD-10-CM | POA: Diagnosis not present

## 2011-09-13 DIAGNOSIS — E119 Type 2 diabetes mellitus without complications: Secondary | ICD-10-CM | POA: Diagnosis not present

## 2011-09-13 DIAGNOSIS — I10 Essential (primary) hypertension: Secondary | ICD-10-CM | POA: Diagnosis not present

## 2011-09-16 DIAGNOSIS — Z0000 Encounter for general adult medical examination without abnormal findings: Secondary | ICD-10-CM | POA: Diagnosis not present

## 2011-09-16 DIAGNOSIS — K219 Gastro-esophageal reflux disease without esophagitis: Secondary | ICD-10-CM | POA: Diagnosis not present

## 2011-09-16 DIAGNOSIS — M1990 Unspecified osteoarthritis, unspecified site: Secondary | ICD-10-CM | POA: Diagnosis not present

## 2011-09-16 DIAGNOSIS — Z23 Encounter for immunization: Secondary | ICD-10-CM | POA: Diagnosis not present

## 2011-09-16 DIAGNOSIS — I2510 Atherosclerotic heart disease of native coronary artery without angina pectoris: Secondary | ICD-10-CM | POA: Diagnosis not present

## 2012-02-21 DIAGNOSIS — I10 Essential (primary) hypertension: Secondary | ICD-10-CM | POA: Diagnosis not present

## 2012-02-21 DIAGNOSIS — E785 Hyperlipidemia, unspecified: Secondary | ICD-10-CM | POA: Diagnosis not present

## 2012-02-21 DIAGNOSIS — K649 Unspecified hemorrhoids: Secondary | ICD-10-CM | POA: Diagnosis not present

## 2012-02-21 DIAGNOSIS — E119 Type 2 diabetes mellitus without complications: Secondary | ICD-10-CM | POA: Diagnosis not present

## 2012-04-24 DIAGNOSIS — H2510 Age-related nuclear cataract, unspecified eye: Secondary | ICD-10-CM | POA: Diagnosis not present

## 2012-07-05 DIAGNOSIS — R42 Dizziness and giddiness: Secondary | ICD-10-CM | POA: Diagnosis not present

## 2012-07-05 DIAGNOSIS — E119 Type 2 diabetes mellitus without complications: Secondary | ICD-10-CM | POA: Diagnosis not present

## 2012-07-05 DIAGNOSIS — K649 Unspecified hemorrhoids: Secondary | ICD-10-CM | POA: Diagnosis not present

## 2012-07-05 DIAGNOSIS — Z23 Encounter for immunization: Secondary | ICD-10-CM | POA: Diagnosis not present

## 2012-09-06 HISTORY — PX: CATARACT EXTRACTION W/ INTRAOCULAR LENS IMPLANT: SHX1309

## 2012-09-06 HISTORY — PX: CATARACT EXTRACTION: SUR2

## 2012-09-12 DIAGNOSIS — E119 Type 2 diabetes mellitus without complications: Secondary | ICD-10-CM | POA: Diagnosis not present

## 2012-09-12 DIAGNOSIS — I10 Essential (primary) hypertension: Secondary | ICD-10-CM | POA: Diagnosis not present

## 2012-09-12 DIAGNOSIS — R82998 Other abnormal findings in urine: Secondary | ICD-10-CM | POA: Diagnosis not present

## 2012-09-12 DIAGNOSIS — E785 Hyperlipidemia, unspecified: Secondary | ICD-10-CM | POA: Diagnosis not present

## 2012-09-19 DIAGNOSIS — E1149 Type 2 diabetes mellitus with other diabetic neurological complication: Secondary | ICD-10-CM | POA: Diagnosis not present

## 2012-09-19 DIAGNOSIS — Z0000 Encounter for general adult medical examination without abnormal findings: Secondary | ICD-10-CM | POA: Diagnosis not present

## 2012-09-19 DIAGNOSIS — G609 Hereditary and idiopathic neuropathy, unspecified: Secondary | ICD-10-CM | POA: Diagnosis not present

## 2012-09-19 DIAGNOSIS — I2510 Atherosclerotic heart disease of native coronary artery without angina pectoris: Secondary | ICD-10-CM | POA: Diagnosis not present

## 2012-09-19 DIAGNOSIS — E1142 Type 2 diabetes mellitus with diabetic polyneuropathy: Secondary | ICD-10-CM | POA: Insufficient documentation

## 2012-11-27 DIAGNOSIS — H2510 Age-related nuclear cataract, unspecified eye: Secondary | ICD-10-CM | POA: Diagnosis not present

## 2012-12-20 DIAGNOSIS — H2510 Age-related nuclear cataract, unspecified eye: Secondary | ICD-10-CM | POA: Diagnosis not present

## 2012-12-20 DIAGNOSIS — H2589 Other age-related cataract: Secondary | ICD-10-CM | POA: Diagnosis not present

## 2012-12-20 DIAGNOSIS — H269 Unspecified cataract: Secondary | ICD-10-CM | POA: Diagnosis not present

## 2013-01-31 DIAGNOSIS — G609 Hereditary and idiopathic neuropathy, unspecified: Secondary | ICD-10-CM | POA: Diagnosis not present

## 2013-01-31 DIAGNOSIS — E1149 Type 2 diabetes mellitus with other diabetic neurological complication: Secondary | ICD-10-CM | POA: Diagnosis not present

## 2013-01-31 DIAGNOSIS — IMO0002 Reserved for concepts with insufficient information to code with codable children: Secondary | ICD-10-CM | POA: Diagnosis not present

## 2013-01-31 DIAGNOSIS — E785 Hyperlipidemia, unspecified: Secondary | ICD-10-CM | POA: Diagnosis not present

## 2013-01-31 DIAGNOSIS — I10 Essential (primary) hypertension: Secondary | ICD-10-CM | POA: Diagnosis not present

## 2013-06-12 DIAGNOSIS — I10 Essential (primary) hypertension: Secondary | ICD-10-CM | POA: Diagnosis not present

## 2013-06-12 DIAGNOSIS — Z1331 Encounter for screening for depression: Secondary | ICD-10-CM | POA: Diagnosis not present

## 2013-06-12 DIAGNOSIS — E119 Type 2 diabetes mellitus without complications: Secondary | ICD-10-CM | POA: Diagnosis not present

## 2013-06-12 DIAGNOSIS — K219 Gastro-esophageal reflux disease without esophagitis: Secondary | ICD-10-CM | POA: Diagnosis not present

## 2013-06-12 DIAGNOSIS — IMO0002 Reserved for concepts with insufficient information to code with codable children: Secondary | ICD-10-CM | POA: Diagnosis not present

## 2013-06-18 DIAGNOSIS — Z23 Encounter for immunization: Secondary | ICD-10-CM | POA: Diagnosis not present

## 2013-08-13 DIAGNOSIS — R05 Cough: Secondary | ICD-10-CM | POA: Diagnosis not present

## 2013-08-13 DIAGNOSIS — I10 Essential (primary) hypertension: Secondary | ICD-10-CM | POA: Diagnosis not present

## 2013-08-13 DIAGNOSIS — J069 Acute upper respiratory infection, unspecified: Secondary | ICD-10-CM | POA: Diagnosis not present

## 2013-09-17 DIAGNOSIS — E785 Hyperlipidemia, unspecified: Secondary | ICD-10-CM | POA: Diagnosis not present

## 2013-09-17 DIAGNOSIS — E1149 Type 2 diabetes mellitus with other diabetic neurological complication: Secondary | ICD-10-CM | POA: Diagnosis not present

## 2013-09-17 DIAGNOSIS — I2510 Atherosclerotic heart disease of native coronary artery without angina pectoris: Secondary | ICD-10-CM | POA: Diagnosis not present

## 2013-10-01 DIAGNOSIS — J45909 Unspecified asthma, uncomplicated: Secondary | ICD-10-CM | POA: Diagnosis not present

## 2013-10-01 DIAGNOSIS — Z0000 Encounter for general adult medical examination without abnormal findings: Secondary | ICD-10-CM | POA: Diagnosis not present

## 2013-10-01 DIAGNOSIS — I10 Essential (primary) hypertension: Secondary | ICD-10-CM | POA: Diagnosis not present

## 2013-10-01 DIAGNOSIS — E1149 Type 2 diabetes mellitus with other diabetic neurological complication: Secondary | ICD-10-CM | POA: Diagnosis not present

## 2013-10-01 DIAGNOSIS — K429 Umbilical hernia without obstruction or gangrene: Secondary | ICD-10-CM | POA: Diagnosis not present

## 2013-10-01 DIAGNOSIS — G609 Hereditary and idiopathic neuropathy, unspecified: Secondary | ICD-10-CM | POA: Diagnosis not present

## 2013-10-01 DIAGNOSIS — K219 Gastro-esophageal reflux disease without esophagitis: Secondary | ICD-10-CM | POA: Diagnosis not present

## 2013-10-01 DIAGNOSIS — I2510 Atherosclerotic heart disease of native coronary artery without angina pectoris: Secondary | ICD-10-CM | POA: Diagnosis not present

## 2014-01-31 DIAGNOSIS — M722 Plantar fascial fibromatosis: Secondary | ICD-10-CM | POA: Diagnosis not present

## 2014-01-31 DIAGNOSIS — IMO0002 Reserved for concepts with insufficient information to code with codable children: Secondary | ICD-10-CM | POA: Diagnosis not present

## 2014-01-31 DIAGNOSIS — F329 Major depressive disorder, single episode, unspecified: Secondary | ICD-10-CM | POA: Diagnosis not present

## 2014-01-31 DIAGNOSIS — E1149 Type 2 diabetes mellitus with other diabetic neurological complication: Secondary | ICD-10-CM | POA: Diagnosis not present

## 2014-01-31 DIAGNOSIS — I10 Essential (primary) hypertension: Secondary | ICD-10-CM | POA: Diagnosis not present

## 2014-01-31 DIAGNOSIS — F3289 Other specified depressive episodes: Secondary | ICD-10-CM | POA: Diagnosis not present

## 2014-02-13 DIAGNOSIS — Z780 Asymptomatic menopausal state: Secondary | ICD-10-CM | POA: Diagnosis not present

## 2014-04-03 DIAGNOSIS — H04129 Dry eye syndrome of unspecified lacrimal gland: Secondary | ICD-10-CM | POA: Diagnosis not present

## 2014-04-03 DIAGNOSIS — Z961 Presence of intraocular lens: Secondary | ICD-10-CM | POA: Diagnosis not present

## 2014-04-03 DIAGNOSIS — H26499 Other secondary cataract, unspecified eye: Secondary | ICD-10-CM | POA: Diagnosis not present

## 2014-04-14 DIAGNOSIS — R0600 Dyspnea, unspecified: Secondary | ICD-10-CM

## 2014-04-14 DIAGNOSIS — R0609 Other forms of dyspnea: Secondary | ICD-10-CM

## 2014-04-14 HISTORY — DX: Other forms of dyspnea: R06.09

## 2014-04-14 HISTORY — DX: Dyspnea, unspecified: R06.00

## 2014-04-16 DIAGNOSIS — F329 Major depressive disorder, single episode, unspecified: Secondary | ICD-10-CM | POA: Diagnosis not present

## 2014-04-16 DIAGNOSIS — R0609 Other forms of dyspnea: Secondary | ICD-10-CM | POA: Diagnosis not present

## 2014-04-16 DIAGNOSIS — I2510 Atherosclerotic heart disease of native coronary artery without angina pectoris: Secondary | ICD-10-CM | POA: Diagnosis not present

## 2014-04-16 DIAGNOSIS — D509 Iron deficiency anemia, unspecified: Secondary | ICD-10-CM | POA: Diagnosis not present

## 2014-04-16 DIAGNOSIS — F3289 Other specified depressive episodes: Secondary | ICD-10-CM | POA: Diagnosis not present

## 2014-04-16 DIAGNOSIS — IMO0002 Reserved for concepts with insufficient information to code with codable children: Secondary | ICD-10-CM | POA: Diagnosis not present

## 2014-04-16 DIAGNOSIS — I10 Essential (primary) hypertension: Secondary | ICD-10-CM | POA: Diagnosis not present

## 2014-04-16 DIAGNOSIS — E1149 Type 2 diabetes mellitus with other diabetic neurological complication: Secondary | ICD-10-CM | POA: Diagnosis not present

## 2014-04-17 DIAGNOSIS — H04129 Dry eye syndrome of unspecified lacrimal gland: Secondary | ICD-10-CM | POA: Diagnosis not present

## 2014-05-15 DIAGNOSIS — I10 Essential (primary) hypertension: Secondary | ICD-10-CM | POA: Diagnosis not present

## 2014-05-15 DIAGNOSIS — R0609 Other forms of dyspnea: Secondary | ICD-10-CM | POA: Diagnosis not present

## 2014-05-15 DIAGNOSIS — I2510 Atherosclerotic heart disease of native coronary artery without angina pectoris: Secondary | ICD-10-CM | POA: Diagnosis not present

## 2014-05-15 DIAGNOSIS — I252 Old myocardial infarction: Secondary | ICD-10-CM | POA: Diagnosis not present

## 2014-06-14 DIAGNOSIS — F329 Major depressive disorder, single episode, unspecified: Secondary | ICD-10-CM | POA: Diagnosis not present

## 2014-06-14 DIAGNOSIS — Z1389 Encounter for screening for other disorder: Secondary | ICD-10-CM | POA: Diagnosis not present

## 2014-06-14 DIAGNOSIS — Z23 Encounter for immunization: Secondary | ICD-10-CM | POA: Diagnosis not present

## 2014-06-14 DIAGNOSIS — I10 Essential (primary) hypertension: Secondary | ICD-10-CM | POA: Diagnosis not present

## 2014-06-14 DIAGNOSIS — G629 Polyneuropathy, unspecified: Secondary | ICD-10-CM | POA: Diagnosis not present

## 2014-06-14 DIAGNOSIS — E1140 Type 2 diabetes mellitus with diabetic neuropathy, unspecified: Secondary | ICD-10-CM | POA: Diagnosis not present

## 2014-06-19 DIAGNOSIS — Z961 Presence of intraocular lens: Secondary | ICD-10-CM | POA: Diagnosis not present

## 2014-06-19 DIAGNOSIS — H26493 Other secondary cataract, bilateral: Secondary | ICD-10-CM | POA: Diagnosis not present

## 2014-06-19 DIAGNOSIS — H0289 Other specified disorders of eyelid: Secondary | ICD-10-CM | POA: Diagnosis not present

## 2014-06-19 DIAGNOSIS — E119 Type 2 diabetes mellitus without complications: Secondary | ICD-10-CM | POA: Diagnosis not present

## 2014-06-24 DIAGNOSIS — I2510 Atherosclerotic heart disease of native coronary artery without angina pectoris: Secondary | ICD-10-CM | POA: Diagnosis not present

## 2014-06-24 DIAGNOSIS — I10 Essential (primary) hypertension: Secondary | ICD-10-CM | POA: Diagnosis not present

## 2014-06-24 DIAGNOSIS — R0602 Shortness of breath: Secondary | ICD-10-CM | POA: Diagnosis not present

## 2014-07-09 DIAGNOSIS — I2510 Atherosclerotic heart disease of native coronary artery without angina pectoris: Secondary | ICD-10-CM | POA: Diagnosis not present

## 2014-07-09 DIAGNOSIS — E1165 Type 2 diabetes mellitus with hyperglycemia: Secondary | ICD-10-CM | POA: Diagnosis not present

## 2014-07-09 DIAGNOSIS — R0602 Shortness of breath: Secondary | ICD-10-CM | POA: Diagnosis not present

## 2014-07-09 DIAGNOSIS — I10 Essential (primary) hypertension: Secondary | ICD-10-CM | POA: Diagnosis not present

## 2014-07-29 DIAGNOSIS — I2510 Atherosclerotic heart disease of native coronary artery without angina pectoris: Secondary | ICD-10-CM | POA: Diagnosis not present

## 2014-07-29 DIAGNOSIS — E1165 Type 2 diabetes mellitus with hyperglycemia: Secondary | ICD-10-CM | POA: Diagnosis not present

## 2014-07-29 DIAGNOSIS — I252 Old myocardial infarction: Secondary | ICD-10-CM | POA: Diagnosis not present

## 2014-07-29 DIAGNOSIS — R0609 Other forms of dyspnea: Secondary | ICD-10-CM | POA: Diagnosis not present

## 2014-08-07 DIAGNOSIS — R0600 Dyspnea, unspecified: Secondary | ICD-10-CM | POA: Diagnosis not present

## 2014-09-04 ENCOUNTER — Encounter: Payer: Self-pay | Admitting: Neurology

## 2014-09-06 DIAGNOSIS — G4733 Obstructive sleep apnea (adult) (pediatric): Secondary | ICD-10-CM

## 2014-09-06 HISTORY — DX: Obstructive sleep apnea (adult) (pediatric): G47.33

## 2014-09-09 ENCOUNTER — Ambulatory Visit (INDEPENDENT_AMBULATORY_CARE_PROVIDER_SITE_OTHER): Payer: Medicare Other | Admitting: Neurology

## 2014-09-09 ENCOUNTER — Encounter: Payer: Self-pay | Admitting: Neurology

## 2014-09-09 VITALS — BP 108/69 | HR 92 | Temp 97.2°F | Ht 67.0 in | Wt 153.0 lb

## 2014-09-09 DIAGNOSIS — R0683 Snoring: Secondary | ICD-10-CM

## 2014-09-09 DIAGNOSIS — R51 Headache: Secondary | ICD-10-CM

## 2014-09-09 DIAGNOSIS — R519 Headache, unspecified: Secondary | ICD-10-CM

## 2014-09-09 DIAGNOSIS — G478 Other sleep disorders: Secondary | ICD-10-CM

## 2014-09-09 DIAGNOSIS — G4734 Idiopathic sleep related nonobstructive alveolar hypoventilation: Secondary | ICD-10-CM | POA: Diagnosis not present

## 2014-09-09 NOTE — Patient Instructions (Addendum)
Based on your symptoms and your exam I believe you may be at some risk for obstructive sleep apnea or OSA, and I think we should proceed with a sleep study to determine whether you do or do not have OSA and how severe it is. If you have more than mild OSA, I want you to consider treatment with CPAP. Please remember, the risks and ramifications of moderate to severe obstructive sleep apnea or OSA are: Cardiovascular disease, including congestive heart failure, stroke, difficult to control hypertension, arrhythmias, and even type 2 diabetes has been linked to untreated OSA. Sleep apnea causes disruption of sleep and sleep deprivation in most cases, which, in turn, can cause recurrent headaches, problems with memory, mood, concentration, focus, and vigilance. Most people with untreated sleep apnea report excessive daytime sleepiness, which can affect their ability to drive. Please do not drive if you feel sleepy.  I will see you back after your sleep study to go over the test results and where to go from there. We will call you after your sleep study and to set up an appointment at the time.

## 2014-09-09 NOTE — Progress Notes (Signed)
Subjective:    Patient ID: Karina Foster is a 74 y.o. female.  HPI     Star Age, MD, PhD [facility] [address] [place], [place] [address]  Dear Ulice Dash,   I saw your patient, Karina Foster, upon your kind request in my neurologic clinic today for initial consultation of her sleep disorder, in particular, concern for underlying obstructive sleep apnea. The patient is unaccompanied today. As you know, Karina Foster is a 74 year old right-handed woman with an underlying medical history of hypertension, hyperlipidemia, diabetes, depression, reflux disease, iron deficiency, breast cancer (1997, s/p lumpectomy on the right, chemo and XRT), coronary artery disease with history of MI in 2010 and s/p 2 stents, who reports snoring and feeling tired and easily exhausted. She had an overnight pulse oximetry test twice recently, 08/05/14 and 08/13/14, which I reviewed: during the first overnight oximetry test on 08/05/2014 she had a baseline oxygen saturation of 93.8%, a nadir of 88%, and it looks like after 3 AM her pulse rate consistently decreased. She had a repeat test on 08/13/2014 which had approximately an hour ofsignal and lowest oxygen saturation was 75% with time below 88% saturation of 96.7 minutes. Again, after approximately 5 AM her pulse rate decreased for some reason. She reports mild snoring. Her husband has not noted any witnessed apneas. She has no significant daytime somnolence with an Epworth of 4 out of 24 today. She denies restless leg symptoms but has occasional leg cramps at night. She has rare morning headaches. She goes to bed around 11 PM and watches the news while in bed. She falls asleep fairly quickly but does wake up in the middle of the night. She does not have to go to the bathroom and is not sure why she wakes up in the middle of the night. She does not wake up rested.  her wake time is 8 AM. She does not take any naps. She drinks coffee  2-3 cups in the morning. She does not typically drink any sodas. She quit smoking in 1993. She drinks wine every day, 2-3 glasses per day. She has occasional vivid dreams but no nightmares. She has occasional sleep talking but no sleepwalking.   Her Past Medical History Is Significant For: Past Medical History  Diagnosis Date  . Hyperlipidemia   . Hypertension     benign essential  . Diabetes mellitus     uncontrolled   . Depression   . GERD (gastroesophageal reflux disease)   . Iron deficiency   . Breast cancer 1997    history  . Myocardial infarction 12/14/08  . Arteriosclerosis 12/16/08  . H/O coronary angiogram 12/16/08  . Dyspnea on exertion 04/14/14    Her Past Surgical History Is Significant For: Past Surgical History  Procedure Laterality Date  . Tubal ligation  1977  . Breast surgery Right 1995    resection  . Mastectomy Right 1997    breast cancer  . Cataract extraction Bilateral 2014    Her Family History Is Significant For: Family History  Problem Relation Age of Onset  . Other Father     MI  . CVA Father   . Asthma Father   . Diabetes Mellitus II Father   . Hypertension Sister   . Osteoporosis Mother   . Breast cancer Mother   . Heart Problems Mother     Her Social History Is Significant For: History   Social History  . Marital Status: Married  Spouse Name: N/A    Number of Children: 1  . Years of Education: Assoc    Occupational History  .      retired   Social History Main Topics  . Smoking status: Former Research scientist (life sciences)  . Smokeless tobacco: Never Used     Comment: quit in 1991  . Alcohol Use: 0.0 oz/week    0 Not specified per week     Comment: 2 drinks daily  . Drug Use: No  . Sexual Activity: None   Other Topics Concern  . None   Social History Narrative    Her Allergies Are:  Allergies  Allergen Reactions  . Azithromycin     rash  . Demerol [Meperidine]     vomiting  :   Her Current Medications Are:  Outpatient  Encounter Prescriptions as of 09/09/2014  Medication Sig  . aspirin 325 MG tablet Take 325 mg by mouth daily.  Marland Kitchen CITALOPRAM HYDROBROMIDE PO Take 40 mg by mouth daily.  Marland Kitchen exenatide (BYETTA 5 MCG PEN) 5 MCG/0.02ML SOPN injection Inject 5 mcg into the skin 2 (two) times daily with a meal.  . meclizine (ANTIVERT) 25 MG tablet As needed  . metFORMIN (GLUCOPHAGE) 500 MG tablet Take 500 mg by mouth 3 (three) times daily with meals. 1 in AM, oral 2 in PM  . METOPROLOL TARTRATE PO Take 25 mg by mouth 2 (two) times daily.  . nitroGLYCERIN (NITROSTAT) 0.4 MG SL tablet Place 0.4 mg under the tongue every 5 (five) minutes as needed for chest pain.  Marland Kitchen omeprazole (PRILOSEC) 20 MG capsule Take 20 mg by mouth daily.  . ramipril (ALTACE) 2.5 MG capsule Take 2.5 mg by mouth daily.  Marland Kitchen triamcinolone (KENALOG) 0.1 % paste Use as directed 1 application in the mouth or throat as needed.  Marland Kitchen UNABLE TO FIND 500 mg daily. Tumeric supplement  . vitamin B-12 (CYANOCOBALAMIN) 1000 MCG tablet Take 1,000 mcg by mouth daily. ER  . [DISCONTINUED] etodolac (LODINE) 400 MG tablet   :  Review of Systems:  Out of a complete 14 point review of systems, all are reviewed and negative with the exception of these symptoms as listed below:   Review of Systems  Constitutional: Positive for fatigue.  HENT:       Ringing in ears  Respiratory: Positive for shortness of breath.   Gastrointestinal: Positive for diarrhea.  Neurological:       Memory loss,dizziness,snoring  Psychiatric/Behavioral:       Depression,decreased energy    Objective:  Neurologic Exam  Physical Exam Physical Examination:   Filed Vitals:   09/09/14 0826  BP: 108/69  Pulse: 92  Temp: 97.2 F (36.2 C)   General Examination: The patient is a very pleasant 74 y.o. female in no acute distress. She appears well-developed and well-nourished and well groomed.   HEENT: Normocephalic, atraumatic, pupils are equal, round and reactive to light and  accommodation. Funduscopic exam is normal with sharp disc margins noted. Extraocular tracking is good without limitation to gaze excursion or nystagmus noted. Normal smooth pursuit is noted. Hearing is grossly intact. Tympanic membranes are clear bilaterally. Face is symmetric with normal facial animation and normal facial sensation. Speech is clear with no dysarthria noted. There is no hypophonia. There is no lip, neck/head, jaw or voice tremor. Neck is supple with full range of passive and active motion. There are no carotid bruits on auscultation. Oropharynx exam reveals: mild mouth dryness, adequate dental hygiene and mild airway crowding, due to  mildly redundant appearing soft palate. Uvula is small. Tonsils are small. Mallampati is class II. Neck size is 15 inches.  Chest: Clear to auscultation without wheezing, rhonchi or crackles noted.  Heart: S1+S2+0, regular and normal without murmurs, rubs or gallops noted.   Abdomen: Soft, non-tender and non-distended with normal bowel sounds appreciated on auscultation.  Extremities: There is no pitting edema in the distal lower extremities bilaterally. Pedal pulses are intact.  Skin: Warm and dry without trophic changes noted. There are no varicose veins.  Musculoskeletal: exam reveals no obvious joint deformities, tenderness or joint swelling or erythema.   Neurologically:  Mental status: The patient is awake, alert and oriented in all 4 spheres. Her immediate and remote memory, attention, language skills and fund of knowledge are appropriate. There is no evidence of aphasia, agnosia, apraxia or anomia. Speech is clear with normal prosody and enunciation. Thought process is linear. Mood is normal and affect is normal.  Cranial nerves II - XII are as described above under HEENT exam. In addition: shoulder shrug is normal with equal shoulder height noted. Motor exam: Normal bulk, strength and tone is noted. There is no drift, tremor or rebound.  Romberg is negative. Reflexes are 2+ throughout. Babinski: Toes are flexor bilaterally. Fine motor skills and coordination: intact with normal finger taps, normal hand movements, normal rapid alternating patting, normal foot taps and normal foot agility.  Cerebellar testing: No dysmetria or intention tremor on finger to nose testing. Heel to shin is unremarkable bilaterally. There is no truncal or gait ataxia.  Sensory exam: intact to light touch, pinprick, vibration, temperature sense in the upper and lower extremities.  Gait, station and balance: She stands easily. No veering to one side is noted. No leaning to one side is noted. Posture is age-appropriate and stance is narrow based. Gait shows normal stride length and normal pace. No problems turning are noted. She turns en bloc. Tandem walk is unremarkable. Intact toe and heel stance is noted.                Assessment and plan:   In summary, Karina Foster is a very pleasant 74 y.o.-year old female with an underlying medical history of hypertension, hyperlipidemia, diabetes, depression, reflux disease, iron deficiency, breast cancer (1997, s/p lumpectomy on the right, chemo and XRT), coronary artery disease with history of MI in 2010 and s/p 2 stents, who reports snoring. She had an abnormal overnight pulse oximetry test in December but her test from November 30 did not suggest any significant desaturations. Nevertheless, as I understand she has been placed on supplemental oxygen at night since the second pulse oximetry test. While her history and physical exam are not telltale for obstructive sleep apnea, it is worthwhile pursuing further evaluation for underlying obstructive sleep apnea in the form of a sleep study. I had a long chat with the patient and her husband about my findings and the diagnosis of OSA, its prognosis and treatment options. We talked about medical treatments, surgical interventions and non-pharmacological approaches. I explained  in particular the risks and ramifications of untreated moderate to severe OSA, especially with respect to developing cardiovascular disease down the Road, including congestive heart failure, difficult to treat hypertension, cardiac arrhythmias, or stroke. Even type 2 diabetes has, in part, been linked to untreated OSA. Symptoms of untreated OSA include daytime sleepiness, memory problems, mood irritability and mood disorder such as depression and anxiety, lack of energy, as well as recurrent headaches, especially morning headaches. We  talked about trying to maintain a healthy lifestyle in general, as well as the importance of weight control. I encouraged the patient to eat healthy, exercise daily and keep well hydrated, to keep a scheduled bedtime and wake time routine, to not skip any meals and eat healthy snacks in between meals. I advised the patient not to drive when feeling sleepy. I recommended the following at this time: sleep study with potential positive airway pressure titration. (We will score hypopneas at 4% and split the sleep study into diagnostic and treatment portion, if the estimated. 2 hour AHI is >15/h).   I explained the sleep test procedure to the patient and also outlined possible surgical and non-surgical treatment options of OSA, including the use of a custom-made dental device (which would require a referral to a specialist dentist or oral surgeon), upper airway surgical options, such as pillar implants, radiofrequency surgery, tongue base surgery, and UPPP (which would involve a referral to an ENT surgeon). Rarely, jaw surgery such as mandibular advancement may be considered.  I also explained the CPAP treatment option to the patient, who indicated that she would be willing to try CPAP if the need arises. I explained the importance of being compliant with PAP treatment, not only for insurance purposes but primarily to improve His symptoms, and for the patient's long term health benefit,  including to reduce Her cardiovascular risks. I answered all her questions today and the patient and her husband were in agreement. I would like to see her back after the sleep study is completed and encouraged her to call with any interim questions, concerns, problems or updates.   Thank you very much for allowing me to participate in the care of this nice patient. If I can be of any further assistance to you please do not hesitate to call me at [phone].  Sincerely,   Star Age, MD, PhD

## 2014-10-01 DIAGNOSIS — E1140 Type 2 diabetes mellitus with diabetic neuropathy, unspecified: Secondary | ICD-10-CM | POA: Diagnosis not present

## 2014-10-01 DIAGNOSIS — I2510 Atherosclerotic heart disease of native coronary artery without angina pectoris: Secondary | ICD-10-CM | POA: Diagnosis not present

## 2014-10-01 DIAGNOSIS — I10 Essential (primary) hypertension: Secondary | ICD-10-CM | POA: Diagnosis not present

## 2014-10-01 DIAGNOSIS — E785 Hyperlipidemia, unspecified: Secondary | ICD-10-CM | POA: Diagnosis not present

## 2014-10-01 DIAGNOSIS — R8299 Other abnormal findings in urine: Secondary | ICD-10-CM | POA: Diagnosis not present

## 2014-10-03 ENCOUNTER — Ambulatory Visit (INDEPENDENT_AMBULATORY_CARE_PROVIDER_SITE_OTHER): Payer: Medicare Other | Admitting: Neurology

## 2014-10-03 VITALS — BP 134/73 | HR 101 | Ht 67.0 in | Wt 153.0 lb

## 2014-10-03 DIAGNOSIS — F518 Other sleep disorders not due to a substance or known physiological condition: Secondary | ICD-10-CM | POA: Diagnosis not present

## 2014-10-03 DIAGNOSIS — G4733 Obstructive sleep apnea (adult) (pediatric): Secondary | ICD-10-CM | POA: Diagnosis not present

## 2014-10-03 DIAGNOSIS — G479 Sleep disorder, unspecified: Secondary | ICD-10-CM

## 2014-10-04 NOTE — Sleep Study (Signed)
See attached copy scanned in Encounters tab.Marland KitchenMarland Kitchen

## 2014-10-07 ENCOUNTER — Telehealth: Payer: Self-pay | Admitting: Neurology

## 2014-10-07 DIAGNOSIS — G4733 Obstructive sleep apnea (adult) (pediatric): Secondary | ICD-10-CM

## 2014-10-07 NOTE — Telephone Encounter (Signed)
Please call and notify patient that the recent sleep study confirmed the diagnosis of OSA. She did well with CPAP during the study with significant improvement of the respiratory events. Therefore, I would like start the patient on CPAP at home. I placed the order in the chart.   Arrange for CPAP set up at home through a DME company of patient's choice and fax/route report to PCP and referring MD (if other than PCP).   The patient will also need a follow up appointment with me in 6-8 weeks post set up that has to be scheduled; help the patient schedule this (in a follow-up slot).   Please re-enforce the importance of compliance with treatment and the need for [place] to monitor compliance data.   Once you have spoken to the patient and scheduled the return appointment, you may close this encounter, thanks,   Star Age, MD, PhD [facility]

## 2014-10-08 ENCOUNTER — Encounter: Payer: Self-pay | Admitting: Neurology

## 2014-10-08 DIAGNOSIS — Z23 Encounter for immunization: Secondary | ICD-10-CM | POA: Diagnosis not present

## 2014-10-08 DIAGNOSIS — J45909 Unspecified asthma, uncomplicated: Secondary | ICD-10-CM | POA: Diagnosis not present

## 2014-10-08 DIAGNOSIS — E1140 Type 2 diabetes mellitus with diabetic neuropathy, unspecified: Secondary | ICD-10-CM | POA: Diagnosis not present

## 2014-10-08 DIAGNOSIS — Z1389 Encounter for screening for other disorder: Secondary | ICD-10-CM | POA: Diagnosis not present

## 2014-10-08 DIAGNOSIS — E785 Hyperlipidemia, unspecified: Secondary | ICD-10-CM | POA: Diagnosis not present

## 2014-10-08 DIAGNOSIS — I2510 Atherosclerotic heart disease of native coronary artery without angina pectoris: Secondary | ICD-10-CM | POA: Diagnosis not present

## 2014-10-08 DIAGNOSIS — M1990 Unspecified osteoarthritis, unspecified site: Secondary | ICD-10-CM | POA: Diagnosis not present

## 2014-10-08 DIAGNOSIS — Z6824 Body mass index (BMI) 24.0-24.9, adult: Secondary | ICD-10-CM | POA: Diagnosis not present

## 2014-10-08 DIAGNOSIS — Z008 Encounter for other general examination: Secondary | ICD-10-CM | POA: Diagnosis not present

## 2014-10-08 DIAGNOSIS — D509 Iron deficiency anemia, unspecified: Secondary | ICD-10-CM | POA: Diagnosis not present

## 2014-10-08 DIAGNOSIS — F329 Major depressive disorder, single episode, unspecified: Secondary | ICD-10-CM | POA: Diagnosis not present

## 2014-10-08 DIAGNOSIS — I10 Essential (primary) hypertension: Secondary | ICD-10-CM | POA: Diagnosis not present

## 2014-10-09 ENCOUNTER — Encounter: Payer: Self-pay | Admitting: *Deleted

## 2014-10-09 NOTE — Telephone Encounter (Signed)
Patient was contacted and provided the results of her split night sleep study.  She was informed that there was a diagnosis of obstructive sleep apnea.  She was advised that treatment in the form of CPAP therapy was considered successful in treatment.  Patient was referred to [facility] for CPAP set up.  Dr. Adrian Prows was faxed a copy of the report.   Patient instructed to contact our office 6-8 weeks post set up to schedule a follow up appointment.  The patient gave verbal permission to mail a copy of her test results.

## 2014-10-30 ENCOUNTER — Telehealth: Payer: Self-pay | Admitting: Neurology

## 2014-10-30 DIAGNOSIS — G4733 Obstructive sleep apnea (adult) (pediatric): Secondary | ICD-10-CM

## 2014-10-30 NOTE — Telephone Encounter (Signed)
Received call from [facility] with [place].  Dr. Einar Gip had apparently ordered O2 for this pt prior to her sleep study.  Pt's sleep study did not reflect a need for O2 and she has not been instructed by our office to use or discontinue.  Clarification is being requested from the DME as there has been no order or indication for the pt to discontinue oxygen.  Please advise, they want pt to undergo an ONO on CPAP

## 2014-12-26 ENCOUNTER — Encounter (HOSPITAL_COMMUNITY): Payer: Self-pay

## 2014-12-26 ENCOUNTER — Emergency Department (HOSPITAL_COMMUNITY)
Admission: EM | Admit: 2014-12-26 | Discharge: 2014-12-27 | Disposition: A | Discharge status: Home / Self Care | Payer: Medicare Other | Attending: Emergency Medicine | Admitting: Emergency Medicine

## 2014-12-26 DIAGNOSIS — Z23 Encounter for immunization: Secondary | ICD-10-CM | POA: Diagnosis not present

## 2014-12-26 DIAGNOSIS — S61012A Laceration without foreign body of left thumb without damage to nail, initial encounter: Secondary | ICD-10-CM | POA: Insufficient documentation

## 2014-12-26 DIAGNOSIS — Z7982 Long term (current) use of aspirin: Secondary | ICD-10-CM | POA: Diagnosis not present

## 2014-12-26 DIAGNOSIS — E119 Type 2 diabetes mellitus without complications: Secondary | ICD-10-CM | POA: Diagnosis not present

## 2014-12-26 DIAGNOSIS — F329 Major depressive disorder, single episode, unspecified: Secondary | ICD-10-CM | POA: Diagnosis not present

## 2014-12-26 DIAGNOSIS — I10 Essential (primary) hypertension: Secondary | ICD-10-CM | POA: Diagnosis not present

## 2014-12-26 DIAGNOSIS — Y9389 Activity, other specified: Secondary | ICD-10-CM | POA: Diagnosis not present

## 2014-12-26 DIAGNOSIS — I252 Old myocardial infarction: Secondary | ICD-10-CM | POA: Diagnosis not present

## 2014-12-26 DIAGNOSIS — K219 Gastro-esophageal reflux disease without esophagitis: Secondary | ICD-10-CM | POA: Insufficient documentation

## 2014-12-26 DIAGNOSIS — Z79899 Other long term (current) drug therapy: Secondary | ICD-10-CM | POA: Diagnosis not present

## 2014-12-26 DIAGNOSIS — W260XXA Contact with knife, initial encounter: Secondary | ICD-10-CM | POA: Insufficient documentation

## 2014-12-26 DIAGNOSIS — Z853 Personal history of malignant neoplasm of breast: Secondary | ICD-10-CM | POA: Insufficient documentation

## 2014-12-26 DIAGNOSIS — Z87891 Personal history of nicotine dependence: Secondary | ICD-10-CM | POA: Diagnosis not present

## 2014-12-26 DIAGNOSIS — Y998 Other external cause status: Secondary | ICD-10-CM | POA: Insufficient documentation

## 2014-12-26 DIAGNOSIS — Y929 Unspecified place or not applicable: Secondary | ICD-10-CM | POA: Insufficient documentation

## 2014-12-26 MED ORDER — TETANUS-DIPHTH-ACELL PERTUSSIS 5-2.5-18.5 LF-MCG/0.5 IM SUSP
0.5000 mL | Freq: Once | INTRAMUSCULAR | Status: AC
  Administered 2014-12-27: 0.5 mL via INTRAMUSCULAR
  Filled 2014-12-26: qty 0.5

## 2014-12-26 NOTE — ED Notes (Signed)
Patient cut her left thumb while cutting pie an hour PTA.  Bleeding has now stopped.  She takes ASA 325mg  daily.

## 2014-12-26 NOTE — ED Provider Notes (Signed)
CSN: [id number]     Arrival date & time 12/26/14  2303 History  This chart was scribed for non-physician provider Quincy Carnes, PA-C, working with Everlene Balls, MD by Irene Pap, ED Scribe. This patient was seen in room WTR4/WLPT4 and patient care was started at 11:30 PM.     Chief Complaint  Patient presents with  . Laceration   Patient is a 74 y.o. female presenting with skin laceration. The history is provided by the patient. No language interpreter was used.  Laceration   HPI Comments: Karina Foster is a 74 y.o. female who presents to the Emergency Department complaining of a left thumb laceration onset 2 hours ago. She states that she was cutting apple pie with a kitchen knife and cut her thumb with it. She denies numbness or tingling. She states that there was a lot of bleeding. She states that she takes ASA 325 mg daily, no other anti-coagulation. She states that she does not remember her last tdap.   Past Medical History  Diagnosis Date  . Hyperlipidemia   . Hypertension     benign essential  . Diabetes mellitus     uncontrolled   . Depression   . GERD (gastroesophageal reflux disease)   . Iron deficiency   . Breast cancer 1997    history  . Myocardial infarction 12/14/08  . Arteriosclerosis 12/16/08  . H/O coronary angiogram 12/16/08  . Dyspnea on exertion 04/14/14   Past Surgical History  Procedure Laterality Date  . Tubal ligation  1977  . Breast surgery Right 1995    resection  . Mastectomy Right 1997    breast cancer  . Cataract extraction Bilateral 2014   Family History  Problem Relation Age of Onset  . Other Father     MI  . CVA Father   . Asthma Father   . Diabetes Mellitus II Father   . Hypertension Sister   . Osteoporosis Mother   . Breast cancer Mother   . Heart Problems Mother    History  Substance Use Topics  . Smoking status: Former Research scientist (life sciences)  . Smokeless tobacco: Never Used     Comment: quit in 1991  . Alcohol Use: 0.0 oz/week    0  Standard drinks or equivalent per week     Comment: 2 drinks daily   OB History    No data available     Review of Systems  Skin: Positive for wound.  Neurological: Negative for weakness and numbness.  All other systems reviewed and are negative.  Allergies  Azithromycin and Demerol  Home Medications   Prior to Admission medications   Medication Sig Start Date End Date Taking? Authorizing Provider  aspirin 325 MG tablet Take 325 mg by mouth daily.    Historical Provider, MD  CITALOPRAM HYDROBROMIDE PO Take 40 mg by mouth daily.    Historical Provider, MD  exenatide (BYETTA 5 MCG PEN) 5 MCG/0.02ML SOPN injection Inject 5 mcg into the skin 2 (two) times daily with a meal.    Historical Provider, MD  meclizine (ANTIVERT) 25 MG tablet As needed 06/14/14   Historical Provider, MD  metFORMIN (GLUCOPHAGE) 500 MG tablet Take 500 mg by mouth 3 (three) times daily with meals. 1 in AM, oral 2 in PM    Historical Provider, MD  METOPROLOL TARTRATE PO Take 25 mg by mouth 2 (two) times daily.    Historical Provider, MD  nitroGLYCERIN (NITROSTAT) 0.4 MG SL tablet Place 0.4 mg under the tongue  every 5 (five) minutes as needed for chest pain.    Historical Provider, MD  omeprazole (PRILOSEC) 20 MG capsule Take 20 mg by mouth daily.    Historical Provider, MD  ramipril (ALTACE) 2.5 MG capsule Take 2.5 mg by mouth daily.    Historical Provider, MD  triamcinolone (KENALOG) 0.1 % paste Use as directed 1 application in the mouth or throat as needed.    Historical Provider, MD  UNABLE TO FIND 500 mg daily. Tumeric supplement    Historical Provider, MD  vitamin B-12 (CYANOCOBALAMIN) 1000 MCG tablet Take 1,000 mcg by mouth daily. ER    Historical Provider, MD   BP 119/68 mmHg  Pulse 84  Temp(Src) 97.9 F (36.6 C) (Oral)  Resp 20  SpO2 96%   Physical Exam  Constitutional: She is oriented to person, place, and time. She appears well-developed and well-nourished.  HENT:  Head: Normocephalic and  atraumatic.  Mouth/Throat: Oropharynx is clear and moist.  Eyes: Conjunctivae and EOM are normal. Pupils are equal, round, and reactive to light.  Neck: Normal range of motion.  Cardiovascular: Normal rate, regular rhythm and normal heart sounds.   Pulmonary/Chest: Effort normal and breath sounds normal. No respiratory distress. She has no wheezes.  Musculoskeletal: Normal range of motion.  Left thumb with 1cm superficial laceration of dorsal surface; no active bleeding; full flexion/extension of thumb maintained; hand is NVI  Neurological: She is alert and oriented to person, place, and time.  Skin: Skin is warm and dry.  Psychiatric: She has a normal mood and affect.  Nursing note and vitals reviewed.   ED Course  Procedures (including critical care time)  LACERATION REPAIR Performed by: Larene Pickett Authorized by: Larene Pickett Consent: Verbal consent obtained. Risks and benefits: risks, benefits and alternatives were discussed Consent given by: patient Patient identity confirmed: provided demographic data Prepped and Draped in normal sterile fashion Wound explored  Laceration Location: left thumb  Laceration Length: 1cm, superficial  No Foreign Bodies seen or palpated  Anesthesia: local infiltration  Local anesthetic: none  Anesthetic total: 0 ml  Irrigation method: syringe Amount of cleaning: standard  Skin closure: dermabond  Number of sutures: 0  Technique: n/a  Patient tolerance: Patient tolerated the procedure well with no immediate complications.  DIAGNOSTIC STUDIES: Oxygen Saturation is 96% on room air, normal by my interpretation.    COORDINATION OF CARE: 11:33 PM-Discussed treatment plan which includes wound glue with pt at bedside and pt agreed to plan.   Labs Review Labs Reviewed - No data to display  Imaging Review No results found.   EKG Interpretation None      MDM   Final diagnoses:  Thumb laceration, left, initial  encounter   Superficial left thumb laceration, no evidence of deep tissue or tendon involvement.  No active bleeding on exam. Tetanus updated. Laceration repaired with Dermabond. Patient discharged home in stable condition.  I personally performed the services described in this documentation, which was scribed in my presence. The recorded information has been reviewed and is accurate.  Larene Pickett, PA-C 12/27/14 0013  Everlene Balls, MD 12/27/14 [phone]

## 2014-12-27 ENCOUNTER — Encounter: Payer: Self-pay | Admitting: Neurology

## 2014-12-27 DIAGNOSIS — S61012A Laceration without foreign body of left thumb without damage to nail, initial encounter: Secondary | ICD-10-CM | POA: Diagnosis not present

## 2014-12-27 NOTE — Discharge Instructions (Signed)
dermabond will slough off over the next few days, try not to pull it off. May shower and wash hands normally. Follow-up with primary care physician as needed. Return here as needed.

## 2015-01-03 ENCOUNTER — Ambulatory Visit (INDEPENDENT_AMBULATORY_CARE_PROVIDER_SITE_OTHER): Payer: Medicare Other | Admitting: Neurology

## 2015-01-03 ENCOUNTER — Encounter: Payer: Self-pay | Admitting: Neurology

## 2015-01-03 VITALS — BP 122/80 | HR 80 | Resp 14 | Ht 67.0 in | Wt 154.0 lb

## 2015-01-03 DIAGNOSIS — Z9989 Dependence on other enabling machines and devices: Principal | ICD-10-CM

## 2015-01-03 DIAGNOSIS — G4733 Obstructive sleep apnea (adult) (pediatric): Secondary | ICD-10-CM | POA: Diagnosis not present

## 2015-01-03 DIAGNOSIS — R21 Rash and other nonspecific skin eruption: Secondary | ICD-10-CM | POA: Diagnosis not present

## 2015-01-03 NOTE — Progress Notes (Signed)
Subjective:    Patient ID: Karina Foster is a 74 y.o. female.  HPI     Interim history:   [name] is a 74 year old right-handed woman with an underlying medical history of hypertension, hyperlipidemia, diabetes, depression, reflux disease, iron deficiency, breast cancer (1997, s/p lumpectomy on the right, chemo and XRT), coronary artery disease with history of MI in 2010 and s/p 2 stents, who presents for follow-up consultation of her obstructive sleep apnea, now on treatment with CPAP therapy at home. The patient is unaccompanied today. I first met her on 09/09/2014 at the request of her cardiologist, at which time she reported snoring and daytime somnolence as well as an abnormal pulse oximetry test which I reviewed at the time. I invited her back for sleep study. She had a split-night sleep study on 10/03/2014 and underwent over her test results with her in detail today. Her baseline sleep efficiency was 62.9% with a latency to sleep of 69.5 minutes and wake after sleep onset of 20 minutes with severe sleep fragmentation noted. She had an elevated arousal index. She had an elevated percentage of stage II sleep, and absence of REM sleep prior to CPAP initiation. She had mild to moderate snoring. Total AHI was 22.7 per hour, average oxygen saturation was 93%, nadir was 84%. She was titrated on CPAP from 5-8 cm. She had slow-wave sleep and REM sleep during the second part of the study. Average oxygen saturation was 94%, nadir was 90%. She had no PLMS during the study on a pressure of 8 cm her AHI was 1.7 per hour. Based on the test results are prescribed CPAP therapy for home use.    Today, 01/03/2015: I reviewed her CPAP compliance data from 12/03/2014 through 01/01/2015 which is a total of 30 days during which time she used her machine 28 days with percent used days greater than 4 hours at 87%, indicating very good compliance with an average usage for all days of 7 hours and 29 minutes, residual  AHI at 2.5 per hour, leak low with the 95th percentile at 8.7 L/m on a pressure of 8 cm with EPR of 2.  I also reviewed her prior compliance data from 10/27/2014 through 11/25/2014 which is 30 days during which time she used her machine 29 days with percent used days greater than 4 hours at 93%, indicating excellent compliance with an average usage of 7 hours and 53 minutes, AHI 2.8, leak acceptable with the 95th percentile at 10.9 L/m and pressure at 8 cm with EPR of 2.  Today, 01/03/2015: She reports that she is still trying to get adjusted to CPAP treatment but does endorse compliance.. She is using a nose mask. She does not snoring any longer according to her husband's report. She has never been a great sleeper she does recall. She has some issues falling asleep. She may sleep a little bit more consolidated. She has no significant daytime somnolence. She does not nap. Unfortunately, this morning she woke up with a facial rash affecting her forehead, both sides of her cheek, sparing her nose and sparing her eyes. Of note, she has started using a new face wash and face cream about a month ago but has not had any issues until she woke up this morning. She actually noticed some facial itching in the middle of the night and woke up with that. She did not realize that she had a rash. She has no hives anywhere else and no rash anywhere else. She  did not eat anything new. She has not noticed any breathing difficulty, tongue swelling or lip swelling. She does not currently have a dermatologist. Her own dermatologist retired and she never saw anybody new. Of note, she had to go to emergency room because of a laceration of her left thumb when she was cutting an apple. I reviewed the emergency room records from 12/26/2014. Dermabond was applied successfully. She is healing well from this.  Previously:   She had an overnight pulse oximetry test twice recently, 08/05/14 and 08/13/14, which I reviewed: during the first  overnight oximetry test on 08/05/2014 she had a baseline oxygen saturation of 93.8%, a nadir of 88%, and it looks like after 3 AM her pulse rate consistently decreased. She had a repeat test on 08/13/2014 which had approximately an hour ofsignal and lowest oxygen saturation was 75% with time below 88% saturation of 96.7 minutes. Again, after approximately 5 AM her pulse rate decreased for some reason. She reports mild snoring. Her husband has not noted any witnessed apneas. She has no significant daytime somnolence with an Epworth of 4 out of 24 today. She denies restless leg symptoms but has occasional leg cramps at night. She has rare morning headaches. She goes to bed around 11 PM and watches the news while in bed. She falls asleep fairly quickly but does wake up in the middle of the night. She does not have to go to the bathroom and is not sure why she wakes up in the middle of the night. She does not wake up rested.  her wake time is 8 AM. She does not take any naps. She drinks coffee 2-3 cups in the morning. She does not typically drink any sodas. She quit smoking in 1993. She drinks wine every day, 2-3 glasses per day. She has occasional vivid dreams but no nightmares. She has occasional sleep talking but no sleepwalking.    Her Past Medical History Is Significant For: Past Medical History  Diagnosis Date  . Hyperlipidemia   . Hypertension     benign essential  . Diabetes mellitus     uncontrolled   . Depression   . GERD (gastroesophageal reflux disease)   . Iron deficiency   . Breast cancer 1997    history  . Myocardial infarction 12/14/08  . Arteriosclerosis 12/16/08  . H/O coronary angiogram 12/16/08  . Dyspnea on exertion 04/14/14    Her Past Surgical History Is Significant For: Past Surgical History  Procedure Laterality Date  . Tubal ligation  1977  . Breast surgery Right 1995    resection  . Mastectomy Right 1997    breast cancer  . Cataract extraction Bilateral 2014     Her Family History Is Significant For: Family History  Problem Relation Age of Onset  . Other Father     MI  . CVA Father   . Asthma Father   . Diabetes Mellitus II Father   . Hypertension Sister   . Osteoporosis Mother   . Breast cancer Mother   . Heart Problems Mother     Her Social History Is Significant For: History   Social History  . Marital Status: Married    Spouse Name: N/A  . Number of Children: 1  . Years of Education: Assoc    Occupational History  .      retired   Social History Main Topics  . Smoking status: Former Research scientist (life sciences)  . Smokeless tobacco: Never Used     Comment: quit  in 1991  . Alcohol Use: 0.0 oz/week    0 Standard drinks or equivalent per week     Comment: 2 drinks daily  . Drug Use: No  . Sexual Activity: Not on file   Other Topics Concern  . None   Social History Narrative   3 caffeine drinks a day     Her Allergies Are:  Allergies  Allergen Reactions  . Azithromycin     rash  . Demerol [Meperidine]     vomiting  :   Her Current Medications Are:  Outpatient Encounter Prescriptions as of 01/03/2015  Medication Sig  . aspirin 325 MG tablet Take 325 mg by mouth daily.  . CELEXA 40 MG tablet   . CITALOPRAM HYDROBROMIDE PO Take 40 mg by mouth daily.  Marland Kitchen exenatide (BYETTA 5 MCG PEN) 5 MCG/0.02ML SOPN injection Inject 5 mcg into the skin 2 (two) times daily with a meal.  . meclizine (ANTIVERT) 25 MG tablet As needed  . metFORMIN (GLUCOPHAGE) 500 MG tablet Take 500 mg by mouth 3 (three) times daily with meals. 1 in AM, oral 2 in PM  . METOPROLOL TARTRATE PO Take 25 mg by mouth 2 (two) times daily.  . nitroGLYCERIN (NITROSTAT) 0.4 MG SL tablet Place 0.4 mg under the tongue every 5 (five) minutes as needed for chest pain.  Marland Kitchen omeprazole (PRILOSEC) 20 MG capsule Take 20 mg by mouth daily.  . ramipril (ALTACE) 2.5 MG capsule Take 2.5 mg by mouth daily.  Marland Kitchen triamcinolone (KENALOG) 0.1 % paste Use as directed 1 application in the mouth or  throat as needed.  . vitamin B-12 (CYANOCOBALAMIN) 1000 MCG tablet Take 1,000 mcg by mouth daily. ER  . [DISCONTINUED] UNABLE TO FIND 500 mg daily. Tumeric supplement  :  Review of Systems:  Out of a complete 14 point review of systems, all are reviewed and negative with the exception of these symptoms as listed below:   Review of Systems  All other systems reviewed and are negative.   Objective:  Neurologic Exam  Physical Exam Physical Examination:   Filed Vitals:   01/03/15 1149  BP: 122/80  Pulse: 80  Resp: 14   General Examination: The patient is a very pleasant 74 y.o. female in no acute distress. She appears well-developed and well-nourished and well groomed.   HEENT: Normocephalic, atraumatic, pupils are equal, round and reactive to light and accommodation. Funduscopic exam is normal with sharp disc margins noted. Extraocular tracking is good without limitation to gaze excursion or nystagmus noted. Normal smooth pursuit is noted. Hearing is grossly intact. Face is symmetric with normal facial animation and normal facial sensation. She has a new erythematous scaly rash on her face affecting the areas on her for head above her eyebrows, in the temples, sparing the area of her eyes, both cheeks, and both lower face, sparing her neck and decolletee area. She has no lip swelling, no tongue swelling, no urticaria. Speech is clear with no dysarthria noted. There is no hypophonia. There is no lip, neck/head, jaw or voice tremor. Neck is supple with full range of passive and active motion. There are no carotid bruits on auscultation. Oropharynx exam reveals: mild mouth dryness, adequate dental hygiene and mild airway crowding, due to  mildly redundant appearing soft palate. Uvula is small. Tonsils are small. Mallampati is class II.   Chest: Clear to auscultation without wheezing, rhonchi or crackles noted.  Heart: S1+S2+0, regular and normal without murmurs, rubs or gallops noted.  Abdomen: Soft, non-tender and non-distended with normal bowel sounds appreciated on auscultation.  Extremities: There is no pitting edema in the distal lower extremities bilaterally. Pedal pulses are intact. she has a well-healing scar on her left thumb from her laceration last week.   Skin: Warm and dry without trophic changes noted. There are no varicose veins.  Musculoskeletal: exam reveals no obvious joint deformities, tenderness or joint swelling or erythema.   Neurologically:  Mental status: The patient is awake, alert and oriented in all 4 spheres. Her immediate and remote memory, attention, language skills and fund of knowledge are appropriate. There is no evidence of aphasia, agnosia, apraxia or anomia. Speech is clear with normal prosody and enunciation. Thought process is linear. Mood is normal and affect is normal.  Cranial nerves II - XII are as described above under HEENT exam. In addition: shoulder shrug is normal with equal shoulder height noted. Motor exam: Normal bulk, strength and tone is noted. There is no drift, tremor or rebound. Romberg is negative. Reflexes are 2+ throughout. Babinski: Toes are flexor bilaterally. Fine motor skills and coordination: intact with normal finger taps, normal hand movements, normal rapid alternating patting, normal foot taps and normal foot agility.  Cerebellar testing: No dysmetria or intention tremor on finger to nose testing. Heel to shin is unremarkable bilaterally. There is no truncal or gait ataxia.  Sensory exam: intact to light touch, pinprick, vibration, temperature sense in the upper and lower extremities.  Gait, station and balance: She stands easily. No veering to one side is noted. No leaning to one side is noted. Posture is age-appropriate and stance is narrow based. Gait shows normal stride length and normal pace. No problems turning are noted. She turns en bloc. Tandem walk is unremarkable.   Assessment and plan:   In summary,  SLOAN GALENTINE is a very pleasant 74 year old female with an underlying medical history of hypertension, hyperlipidemia, diabetes, depression, reflux disease, iron deficiency, breast cancer (1997, s/p lumpectomy on the right, chemo and XRT), coronary artery disease with history of MI in 2010 and s/p 2 stents, who presents for follow-up consultation of her diagnosis of OSA, now on treatment with CPAP therapy at home. Today, we discussed her sleep study results from December 2015 and her compliance data. She is compliant with treatment and reports some improvement in her sleep, in particular more sleep consolidation and elimination of snoring. Nevertheless, she has occasional ongoing difficulty falling asleep and she is still struggling to be fully compliant with treatment as an adjusting to using it every night. She has skip tonight or so in a 30 day period but she is overall compliant. She is congratulated on her treatment adherence. We again talked about the diagnosis of OSA, its prognosis and treatment options. We talked about medical treatments, surgical interventions and non-pharmacological approaches. I explained in particular the risks and ramifications of untreated moderate to severe OSA, especially with respect to developing cardiovascular disease down the Road, including congestive heart failure, difficult to treat hypertension, cardiac arrhythmias, or stroke. Even type 2 diabetes has, in part, been linked to untreated OSA. Symptoms of untreated OSA include daytime sleepiness, memory problems, mood irritability and mood disorder such as depression and anxiety, lack of energy, as well as recurrent headaches, especially morning headaches. We talked about trying to maintain a healthy lifestyle in general, as well as the importance of weight control. I encouraged the patient to eat healthy, exercise daily and keep well hydrated, to keep a scheduled  bedtime and wake time routine, to not skip any meals and eat  healthy snacks in between meals. I advised the patient not to drive when feeling sleepy.unfortunately, she developed a facial rash. I do not think it is related to her nose mask or headgear because in fact her nose area is somewhat spared and she does not have her mask headgear in the area that is most affected. She may have developed a reaction to a new facial wash or cream and I would be inclined to blame her new cream as her eye areas are spared. She has no evidence of any systemic rash or more serious reaction. I have advised her to stop using the new wash and cream immediately, take Benadryl when she gets home and watch her symptoms carefully. She can use a cold wet washcloth to reduce the redness. I would not put anything new on her face and I've advised her to watch her symptoms over the weekend in particular. If she gets worse or develops hives or any other symptoms she has to go to urgent care or emergency room. She's also advised to make an appointment with dermatology.   I recommended the following at this time: Continue CPAP therapy at home. Do not skip any nights. I explained the importance of being compliant with PAP treatment, not only for insurance purposes but primarily to improve His symptoms, and for the patient's long term health benefit, including to reduce Her cardiovascular risks. I would like to see her back in 6 months, sooner if the need arises. She is doing well from my end of things that her exam is stable. I answered all her questions today and the patient and her husband were in agreement. I would like to see her back after the sleep study is completed and encouraged her to call with any interim questions, concerns, problems or updates.  I spent 25 minutes in total face-to-face time with the patient, more than 50% of which was spent in counseling and coordination of care, reviewing test results, reviewing medication and discussing or reviewing the diagnosis of OSA, new face rash,  its prognosis and treatment options.

## 2015-01-03 NOTE — Patient Instructions (Addendum)
Please continue using your CPAP regularly. While your insurance requires that you use CPAP at least 4 hours each night on 70% of the nights, I recommend, that you not skip any nights and use it throughout the night if you can. Getting used to CPAP and staying with the treatment long term does take time and patience and discipline. Untreated obstructive sleep apnea when it is moderate to severe can have an adverse impact on cardiovascular health and raise her risk for heart disease, arrhythmias, hypertension, congestive heart failure, stroke and diabetes. Untreated obstructive sleep apnea causes sleep disruption, nonrestorative sleep, and sleep deprivation. This can have an impact on your day to day functioning and cause daytime sleepiness and impairment of cognitive function, memory loss, mood disturbance, and problems focussing. Using CPAP regularly can improve these symptoms.  Keep up the good work! I will see you back in 6 months for sleep apnea check up, and if you continue to do well on CPAP I will see you once a year thereafter.   You can try Melatonin at night for sleep: take 3 to 5 mg one to 2 hours before your bedtime.   You need to see a dermatologist for your face rash. Stop your new face wash and cream immediately.

## 2015-02-18 DIAGNOSIS — R5382 Chronic fatigue, unspecified: Secondary | ICD-10-CM | POA: Diagnosis not present

## 2015-02-18 DIAGNOSIS — R5381 Other malaise: Secondary | ICD-10-CM | POA: Diagnosis not present

## 2015-02-18 DIAGNOSIS — I2510 Atherosclerotic heart disease of native coronary artery without angina pectoris: Secondary | ICD-10-CM | POA: Diagnosis not present

## 2015-02-18 DIAGNOSIS — I252 Old myocardial infarction: Secondary | ICD-10-CM | POA: Diagnosis not present

## 2015-02-28 DIAGNOSIS — G4733 Obstructive sleep apnea (adult) (pediatric): Secondary | ICD-10-CM | POA: Diagnosis not present

## 2015-02-28 DIAGNOSIS — E119 Type 2 diabetes mellitus without complications: Secondary | ICD-10-CM | POA: Diagnosis not present

## 2015-02-28 DIAGNOSIS — E785 Hyperlipidemia, unspecified: Secondary | ICD-10-CM | POA: Diagnosis not present

## 2015-02-28 DIAGNOSIS — E1140 Type 2 diabetes mellitus with diabetic neuropathy, unspecified: Secondary | ICD-10-CM | POA: Diagnosis not present

## 2015-02-28 DIAGNOSIS — I2510 Atherosclerotic heart disease of native coronary artery without angina pectoris: Secondary | ICD-10-CM | POA: Diagnosis not present

## 2015-02-28 DIAGNOSIS — Z6824 Body mass index (BMI) 24.0-24.9, adult: Secondary | ICD-10-CM | POA: Diagnosis not present

## 2015-03-13 DIAGNOSIS — Z6824 Body mass index (BMI) 24.0-24.9, adult: Secondary | ICD-10-CM | POA: Diagnosis not present

## 2015-03-13 DIAGNOSIS — E1140 Type 2 diabetes mellitus with diabetic neuropathy, unspecified: Secondary | ICD-10-CM | POA: Diagnosis not present

## 2015-03-13 DIAGNOSIS — I10 Essential (primary) hypertension: Secondary | ICD-10-CM | POA: Diagnosis not present

## 2015-06-04 DIAGNOSIS — Z6823 Body mass index (BMI) 23.0-23.9, adult: Secondary | ICD-10-CM | POA: Diagnosis not present

## 2015-06-04 DIAGNOSIS — E1140 Type 2 diabetes mellitus with diabetic neuropathy, unspecified: Secondary | ICD-10-CM | POA: Diagnosis not present

## 2015-06-04 DIAGNOSIS — K219 Gastro-esophageal reflux disease without esophagitis: Secondary | ICD-10-CM | POA: Diagnosis not present

## 2015-06-10 DIAGNOSIS — Z6823 Body mass index (BMI) 23.0-23.9, adult: Secondary | ICD-10-CM | POA: Diagnosis not present

## 2015-06-10 DIAGNOSIS — R05 Cough: Secondary | ICD-10-CM | POA: Diagnosis not present

## 2015-06-10 DIAGNOSIS — J302 Other seasonal allergic rhinitis: Secondary | ICD-10-CM | POA: Diagnosis not present

## 2015-06-10 DIAGNOSIS — J209 Acute bronchitis, unspecified: Secondary | ICD-10-CM | POA: Diagnosis not present

## 2015-07-11 DIAGNOSIS — I10 Essential (primary) hypertension: Secondary | ICD-10-CM | POA: Diagnosis not present

## 2015-07-11 DIAGNOSIS — Z853 Personal history of malignant neoplasm of breast: Secondary | ICD-10-CM | POA: Diagnosis not present

## 2015-07-11 DIAGNOSIS — Z6823 Body mass index (BMI) 23.0-23.9, adult: Secondary | ICD-10-CM | POA: Diagnosis not present

## 2015-07-11 DIAGNOSIS — F329 Major depressive disorder, single episode, unspecified: Secondary | ICD-10-CM | POA: Diagnosis not present

## 2015-07-11 DIAGNOSIS — R413 Other amnesia: Secondary | ICD-10-CM | POA: Diagnosis not present

## 2015-07-11 DIAGNOSIS — E1140 Type 2 diabetes mellitus with diabetic neuropathy, unspecified: Secondary | ICD-10-CM | POA: Diagnosis not present

## 2015-07-11 DIAGNOSIS — K219 Gastro-esophageal reflux disease without esophagitis: Secondary | ICD-10-CM | POA: Diagnosis not present

## 2015-07-11 DIAGNOSIS — I2510 Atherosclerotic heart disease of native coronary artery without angina pectoris: Secondary | ICD-10-CM | POA: Diagnosis not present

## 2015-07-11 DIAGNOSIS — H9190 Unspecified hearing loss, unspecified ear: Secondary | ICD-10-CM | POA: Diagnosis not present

## 2015-07-11 DIAGNOSIS — G4733 Obstructive sleep apnea (adult) (pediatric): Secondary | ICD-10-CM | POA: Diagnosis not present

## 2015-07-14 ENCOUNTER — Telehealth: Payer: Self-pay

## 2015-07-14 NOTE — Telephone Encounter (Signed)
I called patient to change appt time due to template change. 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 Appt changed to 11/30

## 2015-07-15 DIAGNOSIS — E119 Type 2 diabetes mellitus without complications: Secondary | ICD-10-CM | POA: Diagnosis not present

## 2015-07-23 ENCOUNTER — Ambulatory Visit: Payer: Medicare Other | Admitting: Neurology

## 2015-08-05 DIAGNOSIS — L570 Actinic keratosis: Secondary | ICD-10-CM | POA: Diagnosis not present

## 2015-08-05 DIAGNOSIS — L720 Epidermal cyst: Secondary | ICD-10-CM | POA: Diagnosis not present

## 2015-08-05 DIAGNOSIS — L821 Other seborrheic keratosis: Secondary | ICD-10-CM | POA: Diagnosis not present

## 2015-08-06 ENCOUNTER — Encounter: Payer: Self-pay | Admitting: Neurology

## 2015-08-06 ENCOUNTER — Ambulatory Visit (INDEPENDENT_AMBULATORY_CARE_PROVIDER_SITE_OTHER): Payer: Medicare Other | Admitting: Neurology

## 2015-08-06 VITALS — BP 128/60 | HR 88 | Resp 16 | Ht 67.0 in | Wt 148.0 lb

## 2015-08-06 DIAGNOSIS — G4733 Obstructive sleep apnea (adult) (pediatric): Secondary | ICD-10-CM | POA: Diagnosis not present

## 2015-08-06 DIAGNOSIS — Z9989 Dependence on other enabling machines and devices: Principal | ICD-10-CM

## 2015-08-06 NOTE — Progress Notes (Signed)
Subjective:    Patient ID: Karina Foster is a 74 y.o. female.  HPI     Interim history:   Karina Foster is a 74 year old right-handed woman with an underlying medical history of hypertension, hyperlipidemia, diabetes, depression, reflux disease, iron deficiency, breast cancer (1997, s/p lumpectomy on the right, chemo and XRT), coronary artery disease with history of MI in 2010 and s/p 2 stents, who presents for follow-up consultation of her obstructive sleep apnea, on treatment with CPAP therapy at home. The patient is unaccompanied today. I last saw her on 01/03/2015, at which time she reported that she was still trying to get adjusted to treatment. She had some problems falling asleep. She had just recently developed a new facial rash, likely from a new cream. She also had a recent emergency room visit on 12/26/2014 secondary to a laceration over her left thumb when she was cutting an apple.  Today, 08/06/2015: I reviewed her CPAP compliance data from 07/05/2015 through 08/03/2015 which is a total of 30 days during which time she used her machine every night with percent used days greater than 4 hours at 87%, indicating very good compliance with an average usage of 6 hours and 51 minutes, residual AHI acceptable at 3.2 per hour, leak acceptable with the 95th percentile at 12.1 L/m on a pressure of 8 cm with EPR of 2.  Today, 08/06/2015: She reports doing reasonably well as far as the CPAP is concerned. She is compliant with treatment but admits that she did not take it to her [place] trips. She has some memory issues with short-term memory problems. She is scheduled to see her primary care physician for this first. She has had some issues with a nose mask. She has some pressure points and air leak issues. She is wondering whether she should try a full facemask which a friend is using.   Previously:  I first met her on 09/09/2014 at the request of her cardiologist, at which time she reported  snoring and daytime somnolence as well as an abnormal pulse oximetry test which I reviewed at the time. I invited her back for sleep study. She had a split-night sleep study on 10/03/2014 and underwent over her test results with her in detail today. Her baseline sleep efficiency was 62.9% with a latency to sleep of 69.5 minutes and wake after sleep onset of 20 minutes with severe sleep fragmentation noted. She had an elevated arousal index. She had an elevated percentage of stage II sleep, and absence of REM sleep prior to CPAP initiation. She had mild to moderate snoring. Total AHI was 22.7 per hour, average oxygen saturation was 93%, nadir was 84%. She was titrated on CPAP from 5-8 cm. She had slow-wave sleep and REM sleep during the second part of the study. Average oxygen saturation was 94%, nadir was 90%. She had no PLMS during the study on a pressure of 8 cm her AHI was 1.7 per hour. Based on the test results are prescribed CPAP therapy for home use.   I reviewed her CPAP compliance data from 12/03/2014 through 01/01/2015 which is a total of 30 days during which time she used her machine 28 days with percent used days greater than 4 hours at 87%, indicating very good compliance with an average usage for all days of 7 hours and 29 minutes, residual AHI at 2.5 per hour, leak low with the 95th percentile at 8.7 L/m on a pressure of 8 cm with EPR of 2.  I also reviewed her prior compliance data from 10/27/2014 through 11/25/2014 which is 30 days during which time she used her machine 29 days with percent used days greater than 4 hours at 93%, indicating excellent compliance with an average usage of 7 hours and 53 minutes, AHI 2.8, leak acceptable with the 95th percentile at 10.9 L/m and pressure at 8 cm with EPR of 2.  She had an overnight pulse oximetry test twice recently, 08/05/14 and 08/13/14, which I reviewed: during the first overnight oximetry test on 08/05/2014 she had a baseline oxygen saturation of  93.8%, a nadir of 88%, and it looks like after 3 AM her pulse rate consistently decreased. She had a repeat test on 08/13/2014 which had approximately an hour ofsignal and lowest oxygen saturation was 75% with time below 88% saturation of 96.7 minutes. Again, after approximately 5 AM her pulse rate decreased for some reason. She reports mild snoring. Her husband has not noted any witnessed apneas. She has no significant daytime somnolence with an Epworth of 4 out of 24 today. She denies restless leg symptoms but has occasional leg cramps at night. She has rare morning headaches. She goes to bed around 11 PM and watches the news while in bed. She falls asleep fairly quickly but does wake up in the middle of the night. She does not have to go to the bathroom and is not sure why she wakes up in the middle of the night. She does not wake up rested.  her wake time is 8 AM. She does not take any naps. She drinks coffee 2-3 cups in the morning. She does not typically drink any sodas. She quit smoking in 1993. She drinks wine every day, 2-3 glasses per day. She has occasional vivid dreams but no nightmares. She has occasional sleep talking but no sleepwalking.     Her Past Medical History Is Significant For: Past Medical History  Diagnosis Date  . Hyperlipidemia   . Hypertension     benign essential  . Diabetes mellitus ([place])     uncontrolled   . Depression   . GERD (gastroesophageal reflux disease)   . Iron deficiency   . Breast cancer ([place]) 1997    history  . Myocardial infarction ([place]) 12/14/08  . Arteriosclerosis 12/16/08  . H/O coronary angiogram 12/16/08  . Dyspnea on exertion 04/14/14    Her Past Surgical History Is Significant For: Past Surgical History  Procedure Laterality Date  . Tubal ligation  1977  . Breast surgery Right 1995    resection  . Mastectomy Right 1997    breast cancer  . Cataract extraction Bilateral 2014    Her Family History Is Significant For: Family History   Problem Relation Age of Onset  . Other Father     MI  . CVA Father   . Asthma Father   . Diabetes Mellitus II Father   . Hypertension Sister   . Osteoporosis Mother   . Breast cancer Mother   . Heart Problems Mother     Her Social History Is Significant For: Social History   Social History  . Marital Status: Married    Spouse Name: N/A  . Number of Children: 1  . Years of Education: Assoc    Occupational History  .      retired   Social History Main Topics  . Smoking status: Former Research scientist (life sciences)  . Smokeless tobacco: Never Used     Comment: quit in 1991  . Alcohol Use:  0.0 oz/week    0 Standard drinks or equivalent per week     Comment: 2 drinks daily  . Drug Use: No  . Sexual Activity: Not Asked   Other Topics Concern  . None   Social History Narrative   3 caffeine drinks a day     Her Allergies Are:  Allergies  Allergen Reactions  . Azithromycin     rash  . Demerol [Meperidine]     vomiting  :   Her Current Medications Are:  Outpatient Encounter Prescriptions as of 08/06/2015  Medication Sig  . aspirin 325 MG tablet Take 325 mg by mouth daily.  Marland Kitchen BYETTA 10 MCG PEN 10 MCG/0.04ML SOPN injection   . CELEXA 40 MG tablet   . CITALOPRAM HYDROBROMIDE PO Take 40 mg by mouth daily.  . meclizine (ANTIVERT) 25 MG tablet As needed  . metFORMIN (GLUCOPHAGE) 500 MG tablet Take 500 mg by mouth 3 (three) times daily with meals. 1 in AM, oral 2 in PM  . METOPROLOL TARTRATE PO Take 25 mg by mouth 2 (two) times daily.  . nitroGLYCERIN (NITROSTAT) 0.4 MG SL tablet Place 0.4 mg under the tongue every 5 (five) minutes as needed for chest pain.  . ramipril (ALTACE) 2.5 MG capsule Take 2.5 mg by mouth daily.  . ranitidine (ZANTAC) 150 MG capsule Take 150 mg by mouth 2 (two) times daily.  Marland Kitchen triamcinolone (KENALOG) 0.1 % paste Use as directed 1 application in the mouth or throat as needed.  . vitamin B-12 (CYANOCOBALAMIN) 1000 MCG tablet Take 1,000 mcg by mouth daily. ER  .  [DISCONTINUED] omeprazole (PRILOSEC) 20 MG capsule Take 20 mg by mouth daily.  Marland Kitchen BYDUREON 2 MG PEN   . [DISCONTINUED] exenatide (BYETTA 5 MCG PEN) 5 MCG/0.02ML SOPN injection Inject 5 mcg into the skin 2 (two) times daily with a meal.   No facility-administered encounter medications on file as of 08/06/2015.  :  Review of Systems:  Out of a complete 14 point review of systems, all are reviewed and negative with the exception of these symptoms as listed below:   Review of Systems  Neurological:       Patient wanted Dr. Rexene Alberts to be aware that she is having memory testing done next week.   Patient reports a dry/short cough during the night when wearing CPAP.     Objective:  Neurologic Exam  Physical Exam Physical Examination:   Filed Vitals:   08/06/15 1515  BP: 128/60  Pulse: 88  Resp: 16   General Examination: The patient is a very pleasant 74 y.o. female in no acute distress. She appears well-developed and well-nourished and well groomed. She is in good spirits today.  HEENT: Normocephalic, atraumatic, pupils are equal, round and reactive to light and accommodation. Extraocular tracking is good without limitation to gaze excursion or nystagmus noted. Normal smooth pursut is noted. Hearing is grossly intact ith bilateral hearing aids in place. animation and normal facial sensation. She has a new erythematous scaly rash on her face affecting the areas on her for head above her eyebrows, in the temples, sparing the area of her eyes, both cheeks, and both lower face, sparing her neck and decolletee area. She has no lip swelling, no tongue swelling, no urticaria. Speech is clear with no dysarthria noted. There is no hypophonia. There is no lip, neck/head, jaw or voice tremor. Neck is supple with full range of passive and active motion. There are no carotid bruits on auscultation. Oropharynx  exam reveals: moderate mouth dryness, adequate dental hygiene and mild airway crowding, due to   mildly redundant appearing soft palate. Uvula is small. Tonsils are small. Mallampati is class II.   Chest: Clear to auscultation without wheezing, rhonchi or crackles noted.  Heart: S1+S2+0, regular and normal without murmurs, rubs or gallops noted.   Abdomen: Soft, non-tender and non-distended with normal bowel sounds appreciated on auscultation.  Extremities: There is trace pitting edema in the distal lower extremities bilaterally. Pedal pulses are intact. She has prominent spider veins on both feet.  Skin: Warm and dry without trophic changes noted. There are no varicose veins.  Musculoskeletal: exam reveals no obvious joint deformities, tenderness or joint swelling or erythema.   Neurologically:  Mental status: The patient is awake, alert and oriented in all 4 spheres. Her immediate and remote memory, attention, language skills and fund of knowledge are appropriate. There is no evidence of aphasia, agnosia, apraxia or anomia. Speech is clear with normal prosody and enunciation. Thought process is linear. Mood is normal and affect is normal.  Cranial nerves II - XII are as described above under HEENT exam. In addition: shoulder shrug is normal with equal shoulder height noted. Motor exam: Normal bulk, strength and tone is noted. There is no drift, tremor or rebound. Romberg is negative. Reflexes are 2+ throughout. Fine motor skills and coordination: intact with normal finger taps, normal hand movements, normal rapid alternating patting, normal foot taps and normal foot agility.  Cerebellar testing: No dysmetria or intention tremor on finger to nose testing. Heel to shin is unremarkable bilaterally. There is no truncal or gait ataxia.  Sensory exam: intact to light touch, pinprick, vibration, temperature sense in the upper and lower extremities.  Gait, station and balance: She stands easily. No veering to one side is noted. No leaning to one side is noted. Posture is age-appropriate and stance  is narrow based. Gait shows normal stride length and normal pace. No problems turning are noted. She turns en bloc. Tandem walk is Slightly difficult for her today.   Assessment and plan:   In summary, Karina Foster is a very pleasant 74 year old female with an underlying medical history of hypertension, hyperlipidemia, diabetes, depression, reflux disease, iron deficiency, breast cancer (1997, s/p lumpectomy on the right, chemo and XRT), coronary artery disease with history of MI in 2010 and s/p 2 stents, who presents for follow-up consultation of her diagnosis of OSA, on treatment with CPAP therapy at home with good compliance.. Today, we discussed her sleep study results from January 2016 and her most recent compliance data. She is compliant with treatment and reports some improvement in her sleep, in particular more sleep consolidation and elimination of snoring. She is commended for being compliant. She has had some issues with her current nose mask and we will see if she can try a different type of nose mask. I explained to her that a full facemask will be more prone to dislodging and leakage. She does not appear to have a lot of mouth opening or air leaking.  We again talked about the diagnosis of OSA, its prognosis and treatment options. We talked about medical treatments, surgical interventions and non-pharmacological approaches. I explained in particular the risks and ramifications of untreated moderate to severe OSA, especially with respect to developing cardiovascular disease down the Road, including congestive heart failure, difficult to treat hypertension, cardiac arrhythmias, or stroke. Even type 2 diabetes has, in part, been linked to untreated OSA. Symptoms of  untreated OSA include daytime sleepiness, memory problems, mood irritability and mood disorder such as depression and anxiety, lack of energy, as well as recurrent headaches, especially morning headaches. We talked about trying to  maintain a healthy lifestyle in general, as well as the importance of weight control. I encouraged the patient to eat healthy, exercise daily and keep well hydrated, to keep a scheduled bedtime and wake time routine, to not skip any meals and eat healthy snacks in between meals. I advised the patient not to drive when feeling sleepy.  I recommended the following at this time: Continue CPAP therapy at home. Do not skip any nights. I explained the importance of being compliant with PAP treatment, not only for insurance purposes but primarily to improve His symptoms, and for the patient's long term health benefit, including to reduce Her cardiovascular risks. I would like to see her back in 12 months, sooner if the need arises. She is doing well from my end of things that her exam is stable. I answered all her questions today and the patient was in agreement.  I spent 20 minutes in total face-to-face time with the patient, more than 50% of which was spent in counseling and coordination of care, reviewing test results, reviewing medication and discussing or reviewing the diagnosis of OSA, its prognosis and treatment options.

## 2015-08-06 NOTE — Patient Instructions (Addendum)
Please continue using your CPAP regularly. While your insurance requires that you use CPAP at least 4 hours each night on 70% of the nights, I recommend, that you not skip any nights and use it throughout the night if you can. Getting used to CPAP and staying with the treatment long term does take time and patience and discipline. Untreated obstructive sleep apnea when it is moderate to severe can have an adverse impact on cardiovascular health and raise her risk for heart disease, arrhythmias, hypertension, congestive heart failure, stroke and diabetes. Untreated obstructive sleep apnea causes sleep disruption, nonrestorative sleep, and sleep deprivation. This can have an impact on your day to day functioning and cause daytime sleepiness and impairment of cognitive function, memory loss, mood disturbance, and problems focussing. Using CPAP regularly can improve these symptoms.  We will try a different nose mask for you.   Follow up in 1 year!

## 2015-08-12 DIAGNOSIS — Z6823 Body mass index (BMI) 23.0-23.9, adult: Secondary | ICD-10-CM | POA: Diagnosis not present

## 2015-08-12 DIAGNOSIS — R413 Other amnesia: Secondary | ICD-10-CM | POA: Diagnosis not present

## 2015-10-24 DIAGNOSIS — E1140 Type 2 diabetes mellitus with diabetic neuropathy, unspecified: Secondary | ICD-10-CM | POA: Diagnosis not present

## 2015-10-24 DIAGNOSIS — E784 Other hyperlipidemia: Secondary | ICD-10-CM | POA: Diagnosis not present

## 2015-10-24 DIAGNOSIS — D508 Other iron deficiency anemias: Secondary | ICD-10-CM | POA: Diagnosis not present

## 2015-10-24 DIAGNOSIS — I10 Essential (primary) hypertension: Secondary | ICD-10-CM | POA: Diagnosis not present

## 2015-10-24 DIAGNOSIS — N390 Urinary tract infection, site not specified: Secondary | ICD-10-CM | POA: Diagnosis not present

## 2015-10-24 DIAGNOSIS — I2510 Atherosclerotic heart disease of native coronary artery without angina pectoris: Secondary | ICD-10-CM | POA: Diagnosis not present

## 2015-10-24 DIAGNOSIS — R8299 Other abnormal findings in urine: Secondary | ICD-10-CM | POA: Diagnosis not present

## 2015-11-12 DIAGNOSIS — Z0000 Encounter for general adult medical examination without abnormal findings: Secondary | ICD-10-CM | POA: Diagnosis not present

## 2015-11-12 DIAGNOSIS — I2510 Atherosclerotic heart disease of native coronary artery without angina pectoris: Secondary | ICD-10-CM | POA: Diagnosis not present

## 2015-11-12 DIAGNOSIS — E784 Other hyperlipidemia: Secondary | ICD-10-CM | POA: Diagnosis not present

## 2015-11-12 DIAGNOSIS — G6289 Other specified polyneuropathies: Secondary | ICD-10-CM | POA: Diagnosis not present

## 2015-11-12 DIAGNOSIS — M1990 Unspecified osteoarthritis, unspecified site: Secondary | ICD-10-CM | POA: Diagnosis not present

## 2015-11-12 DIAGNOSIS — Z6822 Body mass index (BMI) 22.0-22.9, adult: Secondary | ICD-10-CM | POA: Diagnosis not present

## 2015-11-12 DIAGNOSIS — H9193 Unspecified hearing loss, bilateral: Secondary | ICD-10-CM | POA: Diagnosis not present

## 2015-11-12 DIAGNOSIS — R413 Other amnesia: Secondary | ICD-10-CM | POA: Diagnosis not present

## 2015-11-12 DIAGNOSIS — Z1389 Encounter for screening for other disorder: Secondary | ICD-10-CM | POA: Diagnosis not present

## 2015-11-12 DIAGNOSIS — G4733 Obstructive sleep apnea (adult) (pediatric): Secondary | ICD-10-CM | POA: Diagnosis not present

## 2015-11-12 DIAGNOSIS — E1140 Type 2 diabetes mellitus with diabetic neuropathy, unspecified: Secondary | ICD-10-CM | POA: Diagnosis not present

## 2015-11-12 DIAGNOSIS — I10 Essential (primary) hypertension: Secondary | ICD-10-CM | POA: Diagnosis not present

## 2016-04-09 DIAGNOSIS — E1140 Type 2 diabetes mellitus with diabetic neuropathy, unspecified: Secondary | ICD-10-CM | POA: Diagnosis not present

## 2016-04-09 DIAGNOSIS — E784 Other hyperlipidemia: Secondary | ICD-10-CM | POA: Diagnosis not present

## 2016-04-09 DIAGNOSIS — F329 Major depressive disorder, single episode, unspecified: Secondary | ICD-10-CM | POA: Diagnosis not present

## 2016-04-09 DIAGNOSIS — G4733 Obstructive sleep apnea (adult) (pediatric): Secondary | ICD-10-CM | POA: Diagnosis not present

## 2016-04-09 DIAGNOSIS — I10 Essential (primary) hypertension: Secondary | ICD-10-CM | POA: Diagnosis not present

## 2016-04-09 DIAGNOSIS — Z6822 Body mass index (BMI) 22.0-22.9, adult: Secondary | ICD-10-CM | POA: Diagnosis not present

## 2016-04-09 DIAGNOSIS — K219 Gastro-esophageal reflux disease without esophagitis: Secondary | ICD-10-CM | POA: Diagnosis not present

## 2016-04-27 ENCOUNTER — Telehealth: Payer: Self-pay

## 2016-04-27 NOTE — Telephone Encounter (Signed)
I called patient to reschedule appt on 11/30 at 1pm. Dr. Rexene Alberts will be in a meeting at that time. I advised that I am moving that appt to 2:30 on same day. Left call back number for questions. New appt: 08/05/16 at 2:30pm.

## 2016-06-28 DIAGNOSIS — Z23 Encounter for immunization: Secondary | ICD-10-CM | POA: Diagnosis not present

## 2016-07-02 DIAGNOSIS — I252 Old myocardial infarction: Secondary | ICD-10-CM | POA: Diagnosis not present

## 2016-07-02 DIAGNOSIS — I2510 Atherosclerotic heart disease of native coronary artery without angina pectoris: Secondary | ICD-10-CM | POA: Diagnosis not present

## 2016-07-02 DIAGNOSIS — R5382 Chronic fatigue, unspecified: Secondary | ICD-10-CM | POA: Diagnosis not present

## 2016-07-02 DIAGNOSIS — R5381 Other malaise: Secondary | ICD-10-CM | POA: Diagnosis not present

## 2016-07-22 DIAGNOSIS — E119 Type 2 diabetes mellitus without complications: Secondary | ICD-10-CM | POA: Diagnosis not present

## 2016-07-22 DIAGNOSIS — H26493 Other secondary cataract, bilateral: Secondary | ICD-10-CM | POA: Diagnosis not present

## 2016-07-22 DIAGNOSIS — Z961 Presence of intraocular lens: Secondary | ICD-10-CM | POA: Diagnosis not present

## 2016-07-22 DIAGNOSIS — H524 Presbyopia: Secondary | ICD-10-CM | POA: Diagnosis not present

## 2016-08-03 DIAGNOSIS — L820 Inflamed seborrheic keratosis: Secondary | ICD-10-CM | POA: Diagnosis not present

## 2016-08-03 DIAGNOSIS — L720 Epidermal cyst: Secondary | ICD-10-CM | POA: Diagnosis not present

## 2016-08-03 DIAGNOSIS — D1801 Hemangioma of skin and subcutaneous tissue: Secondary | ICD-10-CM | POA: Diagnosis not present

## 2016-08-03 DIAGNOSIS — L814 Other melanin hyperpigmentation: Secondary | ICD-10-CM | POA: Diagnosis not present

## 2016-08-03 DIAGNOSIS — L821 Other seborrheic keratosis: Secondary | ICD-10-CM | POA: Diagnosis not present

## 2016-08-05 ENCOUNTER — Encounter: Payer: Self-pay | Admitting: Neurology

## 2016-08-05 ENCOUNTER — Ambulatory Visit: Payer: Medicare Other | Admitting: Neurology

## 2016-08-05 ENCOUNTER — Ambulatory Visit (INDEPENDENT_AMBULATORY_CARE_PROVIDER_SITE_OTHER): Payer: Medicare Other | Admitting: Neurology

## 2016-08-05 VITALS — BP 132/76 | HR 78 | Resp 16 | Ht 67.0 in | Wt 154.0 lb

## 2016-08-05 DIAGNOSIS — Z9989 Dependence on other enabling machines and devices: Secondary | ICD-10-CM

## 2016-08-05 DIAGNOSIS — G4733 Obstructive sleep apnea (adult) (pediatric): Secondary | ICD-10-CM | POA: Diagnosis not present

## 2016-08-05 NOTE — Progress Notes (Signed)
Subjective:    Patient ID: Karina Foster is a 75 y.o. female.  HPI    Interim history:   Karina Foster is a 75 year old right-handed woman with an underlying medical history of hypertension, hyperlipidemia, diabetes, depression, reflux disease, iron deficiency, breast cancer (1997, s/p lumpectomy on the right, chemo and XRT), coronary artery disease with history of MI in 2010 and s/p 2 stents, who presents for follow-up consultation of her obstructive sleep apnea, on treatment with CPAP therapy at home. The patient is unaccompanied today. I last saw her on 08/06/2015, at which time she was compliant with treatment. She was reasonably satisfied with how she was doing. She reported some short-term memory issues. She had some issues with tolerance of the nose mask. She was wondering if she should try a full facemask. I did suggest that we could try her on a different mask.  Today, 08/05/2016: I reviewed her CPAP compliance data from 07/05/2016 through 08/03/2016 which is a total of 30 days, during which time she used her machine 27 days with percent used days greater than 4 hours at 90%, indicating excellent compliance with an average usage of 7 hours and 54 minutes, residual AHI 1.9 per hour, leak low with the 95th percentile at 2.2 L/m on a pressure of 8 cm with EPR of 2.   Today, 08/05/2016: She reports Doing well with CPAP therapy. She and her husband recently moved in to [facility], independent living, she likes her new home. She would like to try a new or different type of nasal pillows because she still has some pressure marks in the mornings on her face. She indicates compliance with treatment and needs new supplies. She would like to try the dream wear nasal pillows which I will order.   Previously:   I saw her on 01/03/2015, at which time she reported that she was still trying to get adjusted to treatment. She had some problems falling asleep. She had just recently developed a new facial rash,  likely from a new cream. She also had a recent emergency room visit on 12/26/2014 secondary to a laceration over her left thumb when she was cutting an apple.   I reviewed her CPAP compliance data from 07/05/2015 through 08/03/2015 which is a total of 30 days during which time she used her machine every night with percent used days greater than 4 hours at 87%, indicating very good compliance with an average usage of 6 hours and 51 minutes, residual AHI acceptable at 3.2 per hour, leak acceptable with the 95th percentile at 12.1 L/m on a pressure of 8 cm with EPR of 2.   I first met her on 09/09/2014 at the request of her cardiologist, at which time she reported snoring and daytime somnolence as well as an abnormal pulse oximetry test which I reviewed at the time. I invited her back for sleep study. She had a split-night sleep study on 10/03/2014 and underwent over her test results with her in detail today. Her baseline sleep efficiency was 62.9% with a latency to sleep of 69.5 minutes and wake after sleep onset of 20 minutes with severe sleep fragmentation noted. She had an elevated arousal index. She had an elevated percentage of stage II sleep, and absence of REM sleep prior to CPAP initiation. She had mild to moderate snoring. Total AHI was 22.7 per hour, average oxygen saturation was 93%, nadir was 84%. She was titrated on CPAP from 5-8 cm. She had slow-wave sleep and REM sleep  during the second part of the study. Average oxygen saturation was 94%, nadir was 90%. She had no PLMS during the study on a pressure of 8 cm her AHI was 1.7 per hour. Based on the test results are prescribed CPAP therapy for home use.    I reviewed her CPAP compliance data from 12/03/2014 through 01/01/2015 which is a total of 30 days during which time she used her machine 28 days with percent used days greater than 4 hours at 87%, indicating very good compliance with an average usage for all days of 7 hours and 29 minutes,  residual AHI at 2.5 per hour, leak low with the 95th percentile at 8.7 L/m on a pressure of 8 cm with EPR of 2.   I also reviewed her prior compliance data from 10/27/2014 through 11/25/2014 which is 30 days during which time she used her machine 29 days with percent used days greater than 4 hours at 93%, indicating excellent compliance with an average usage of 7 hours and 53 minutes, AHI 2.8, leak acceptable with the 95th percentile at 10.9 L/m and pressure at 8 cm with EPR of 2.   She had an overnight pulse oximetry test twice recently, 08/05/14 and 08/13/14, which I reviewed: during the first overnight oximetry test on 08/05/2014 she had a baseline oxygen saturation of 93.8%, a nadir of 88%, and it looks like after 3 AM her pulse rate consistently decreased. She had a repeat test on 08/13/2014 which had approximately an hour ofsignal and lowest oxygen saturation was 75% with time below 88% saturation of 96.7 minutes. Again, after approximately 5 AM her pulse rate decreased for some reason. She reports mild snoring. Her husband has not noted any witnessed apneas. She has no significant daytime somnolence with an Epworth of 4 out of 24 today. She denies restless leg symptoms but has occasional leg cramps at night. She has rare morning headaches. She goes to bed around 11 PM and watches the news while in bed. She falls asleep fairly quickly but does wake up in the middle of the night. She does not have to go to the bathroom and is not sure why she wakes up in the middle of the night. She does not wake up rested.  her wake time is 8 AM. She does not take any naps. She drinks coffee 2-3 cups in the morning. She does not typically drink any sodas. She quit smoking in 1993. She drinks wine every day, 2-3 glasses per day. She has occasional vivid dreams but no nightmares. She has occasional sleep talking but no sleepwalking.      Her Past Medical History Is Significant For: Past Medical History:  Diagnosis Date   . Arteriosclerosis 12/16/08  . Breast cancer ([place]) 1997   history  . Depression   . Diabetes mellitus ([place])    uncontrolled   . Dyspnea on exertion 04/14/14  . GERD (gastroesophageal reflux disease)   . H/O coronary angiogram 12/16/08  . Hyperlipidemia   . Hypertension    benign essential  . Iron deficiency   . Myocardial infarction 12/14/08    Her Past Surgical History Is Significant For: Past Surgical History:  Procedure Laterality Date  . BREAST SURGERY Right 1995   resection  . CATARACT EXTRACTION Bilateral 2014  . MASTECTOMY Right 1997   breast cancer  . TUBAL LIGATION  1977    Her Family History Is Significant For: Family History  Problem Relation Age of Onset  . Other Father  MI  . CVA Father   . Asthma Father   . Diabetes Mellitus II Father   . Hypertension Sister   . Osteoporosis Mother   . Breast cancer Mother   . Heart Problems Mother     Her Social History Is Significant For: Social History   Social History  . Marital status: Married    Spouse name: N/A  . Number of children: 1  . Years of education: Assoc    Occupational History  .      retired   Social History Main Topics  . Smoking status: Former Research scientist (life sciences)  . Smokeless tobacco: Never Used     Comment: quit in 1991  . Alcohol use 0.0 oz/week     Comment: 2 drinks daily  . Drug use: No  . Sexual activity: Not Asked   Other Topics Concern  . None   Social History Narrative   3 caffeine drinks a day     Her Allergies Are:  Allergies  Allergen Reactions  . Azithromycin     rash  . Demerol [Meperidine]     vomiting  :   Her Current Medications Are:  Outpatient Encounter Prescriptions as of 08/05/2016  Medication Sig  . aspirin 325 MG tablet Take 325 mg by mouth daily.  Marland Kitchen BYDUREON 2 MG PEN   . CELEXA 40 MG tablet   . CITALOPRAM HYDROBROMIDE PO Take 40 mg by mouth daily.  . meclizine (ANTIVERT) 25 MG tablet As needed  . metFORMIN (GLUCOPHAGE) 500 MG tablet Take 500 mg by  mouth 3 (three) times daily with meals. 1 in AM, oral 2 in PM  . METOPROLOL TARTRATE PO Take 25 mg by mouth 2 (two) times daily.  . nitroGLYCERIN (NITROSTAT) 0.4 MG SL tablet Place 0.4 mg under the tongue every 5 (five) minutes as needed for chest pain.  . ramipril (ALTACE) 2.5 MG capsule Take 2.5 mg by mouth daily.  . ranitidine (ZANTAC) 150 MG capsule Take 150 mg by mouth 2 (two) times daily.  Marland Kitchen triamcinolone (KENALOG) 0.1 % paste Use as directed 1 application in the mouth or throat as needed.  . vitamin B-12 (CYANOCOBALAMIN) 1000 MCG tablet Take 1,000 mcg by mouth daily. ER  . [DISCONTINUED] BYETTA 10 MCG PEN 10 MCG/0.04ML SOPN injection    No facility-administered encounter medications on file as of 08/05/2016.   :  Review of Systems:  Out of a complete 14 point review of systems, all are reviewed and negative with the exception of these symptoms as listed below: Review of Systems  Neurological:       Patient states that she is doing ok with CPAP. She would like to discuss getting a different kind of mask.     Objective:  Neurologic Exam  Physical Exam Physical Examination:   Vitals:   08/05/16 1439  BP: 132/76  Pulse: 78  Resp: 16   General Examination: The patient is a very pleasant 75 y.o. female in no acute distress. She appears well-developed and well-nourished and well groomed. She is in good spirits today.  HEENT: Normocephalic, atraumatic, pupils are equal, round and reactive to light and accommodation. Extraocular tracking is good without limitation to gaze excursion or nystagmus noted. Normal smooth pursut is noted. Hearing is grossly intact ith bilateral hearing aids in place. animation and normal facial sensation. She has a new erythematous scaly rash on her face affecting the areas on her for head above her eyebrows, in the temples, sparing the area of her  eyes, both cheeks, and both lower face, sparing her neck and decolletee area. She has no lip swelling, no tongue  swelling, no urticaria. Speech is clear with no dysarthria noted. There is no hypophonia. There is no lip, neck/head, jaw or voice tremor. Neck is supple with full range of passive and active motion. There are no carotid bruits on auscultation. Oropharynx exam reveals: moderate mouth dryness, adequate dental hygiene and mild airway crowding, due to  mildly redundant appearing soft palate. Uvula is small. Tonsils are small. Mallampati is class II.   Chest: Clear to auscultation without wheezing, rhonchi or crackles noted.  Heart: S1+S2+0, regular and normal without murmurs, rubs or gallops noted.   Abdomen: Soft, non-tender and non-distended with normal bowel sounds appreciated on auscultation.  Extremities: There is trace pitting edema in the distal lower extremities bilaterally. Pedal pulses are intact. She has prominent spider veins on both feet.  Skin: Warm and dry without trophic changes noted. There are no varicose veins.  Musculoskeletal: exam reveals no obvious joint deformities, tenderness or joint swelling or erythema.   Neurologically:  Mental status: The patient is awake, alert and oriented in all 4 spheres. Her immediate and remote memory, attention, language skills and fund of knowledge are appropriate. There is no evidence of aphasia, agnosia, apraxia or anomia. Speech is clear with normal prosody and enunciation. Thought process is linear. Mood is normal and affect is normal.  Cranial nerves II - XII are as described above under HEENT exam. In addition: shoulder shrug is normal with equal shoulder height noted. Motor exam: Normal bulk, strength and tone is noted. There is no drift, tremor or rebound. Romberg is negative. Reflexes are 1+ throughout, absent in the ankles. Fine motor skills and coordination: intact with normal finger taps, normal hand movements, normal rapid alternating patting, normal foot taps and normal foot agility.  Cerebellar testing: No dysmetria or intention  tremor on finger to nose testing. Heel to shin is unremarkable bilaterally. There is no truncal or gait ataxia.  Sensory exam: intact to light touch, in the upper and lower extremities, but decrease in PP sensation distal LEs.  Gait, station and balance: She stands easily. No veering to one side is noted. No leaning to one side is noted. Posture is age-appropriate and stance is narrow based. Gait shows normal stride length and normal pace. No problems turning are noted. Tandem walk is Slightly difficult for her today.   Assessment and plan:   In summary, METZTLI SACHDEV is a very pleasant 75 year old female with an underlying medical history of hypertension, hyperlipidemia, diabetes, depression, reflux disease, iron deficiency, breast cancer (1997, s/p lumpectomy on the right, chemo and XRT), coronary artery disease with history of MI in 2010 and s/p 2 stents, who presents for follow-up consultation of her diagnosis of OSA, on treatment with CPAP therapy at home with excellent compliance. We previously discussed her split night sleep study results from October 03, 2014 which showed an AHI of 22.7/h and her O2 nadir 84%, and her most recent compliance data. She is compliant with treatment and reported some improvement in her sleep, in particular more sleep consolidation and elimination of snoring. She is commended for being compliant. She has had difficulty with a nasal mask and we changed her interface to nasal pillows. I will reorder supplies for her and also see if she can try the dream wear nasal pillows. From my end of things she is doing well. I suggested a one-year checkup, sooner  as needed. I answered all her questions today and she was in agreement.  I spent 25 minutes in total face-to-face time with the patient, more than 50% of which was spent in counseling and coordination of care, reviewing test results, reviewing medication and discussing or reviewing the diagnosis of OSA, its prognosis and  treatment options.

## 2016-12-14 DIAGNOSIS — E784 Other hyperlipidemia: Secondary | ICD-10-CM | POA: Diagnosis not present

## 2016-12-14 DIAGNOSIS — I10 Essential (primary) hypertension: Secondary | ICD-10-CM | POA: Diagnosis not present

## 2016-12-14 DIAGNOSIS — N390 Urinary tract infection, site not specified: Secondary | ICD-10-CM | POA: Diagnosis not present

## 2016-12-14 DIAGNOSIS — E1140 Type 2 diabetes mellitus with diabetic neuropathy, unspecified: Secondary | ICD-10-CM | POA: Diagnosis not present

## 2016-12-14 DIAGNOSIS — R8299 Other abnormal findings in urine: Secondary | ICD-10-CM | POA: Diagnosis not present

## 2016-12-21 DIAGNOSIS — R413 Other amnesia: Secondary | ICD-10-CM | POA: Diagnosis not present

## 2016-12-21 DIAGNOSIS — Z6824 Body mass index (BMI) 24.0-24.9, adult: Secondary | ICD-10-CM | POA: Diagnosis not present

## 2016-12-21 DIAGNOSIS — E1140 Type 2 diabetes mellitus with diabetic neuropathy, unspecified: Secondary | ICD-10-CM | POA: Diagnosis not present

## 2016-12-21 DIAGNOSIS — G4733 Obstructive sleep apnea (adult) (pediatric): Secondary | ICD-10-CM | POA: Diagnosis not present

## 2016-12-21 DIAGNOSIS — I2510 Atherosclerotic heart disease of native coronary artery without angina pectoris: Secondary | ICD-10-CM | POA: Diagnosis not present

## 2016-12-21 DIAGNOSIS — Z0000 Encounter for general adult medical examination without abnormal findings: Secondary | ICD-10-CM | POA: Diagnosis not present

## 2016-12-21 DIAGNOSIS — E784 Other hyperlipidemia: Secondary | ICD-10-CM | POA: Diagnosis not present

## 2016-12-21 DIAGNOSIS — Z1389 Encounter for screening for other disorder: Secondary | ICD-10-CM | POA: Diagnosis not present

## 2016-12-21 DIAGNOSIS — F39 Unspecified mood [affective] disorder: Secondary | ICD-10-CM | POA: Diagnosis not present

## 2016-12-21 DIAGNOSIS — I10 Essential (primary) hypertension: Secondary | ICD-10-CM | POA: Diagnosis not present

## 2016-12-21 DIAGNOSIS — G6289 Other specified polyneuropathies: Secondary | ICD-10-CM | POA: Diagnosis not present

## 2016-12-21 DIAGNOSIS — Z853 Personal history of malignant neoplasm of breast: Secondary | ICD-10-CM | POA: Diagnosis not present

## 2017-02-24 DIAGNOSIS — H26493 Other secondary cataract, bilateral: Secondary | ICD-10-CM | POA: Diagnosis not present

## 2017-02-24 DIAGNOSIS — Z961 Presence of intraocular lens: Secondary | ICD-10-CM | POA: Diagnosis not present

## 2017-02-24 DIAGNOSIS — H04123 Dry eye syndrome of bilateral lacrimal glands: Secondary | ICD-10-CM | POA: Diagnosis not present

## 2017-02-24 DIAGNOSIS — E119 Type 2 diabetes mellitus without complications: Secondary | ICD-10-CM | POA: Diagnosis not present

## 2017-02-24 DIAGNOSIS — H524 Presbyopia: Secondary | ICD-10-CM | POA: Diagnosis not present

## 2017-05-04 DIAGNOSIS — I10 Essential (primary) hypertension: Secondary | ICD-10-CM | POA: Diagnosis not present

## 2017-05-04 DIAGNOSIS — E1140 Type 2 diabetes mellitus with diabetic neuropathy, unspecified: Secondary | ICD-10-CM | POA: Diagnosis not present

## 2017-05-04 DIAGNOSIS — I2510 Atherosclerotic heart disease of native coronary artery without angina pectoris: Secondary | ICD-10-CM | POA: Diagnosis not present

## 2017-05-04 DIAGNOSIS — R0609 Other forms of dyspnea: Secondary | ICD-10-CM | POA: Diagnosis not present

## 2017-05-04 DIAGNOSIS — Z6824 Body mass index (BMI) 24.0-24.9, adult: Secondary | ICD-10-CM | POA: Diagnosis not present

## 2017-05-04 DIAGNOSIS — G4733 Obstructive sleep apnea (adult) (pediatric): Secondary | ICD-10-CM | POA: Diagnosis not present

## 2017-05-04 DIAGNOSIS — Z23 Encounter for immunization: Secondary | ICD-10-CM | POA: Diagnosis not present

## 2017-05-12 DIAGNOSIS — I252 Old myocardial infarction: Secondary | ICD-10-CM | POA: Diagnosis not present

## 2017-05-12 DIAGNOSIS — I10 Essential (primary) hypertension: Secondary | ICD-10-CM | POA: Diagnosis not present

## 2017-05-12 DIAGNOSIS — I2510 Atherosclerotic heart disease of native coronary artery without angina pectoris: Secondary | ICD-10-CM | POA: Diagnosis not present

## 2017-05-12 DIAGNOSIS — R0609 Other forms of dyspnea: Secondary | ICD-10-CM | POA: Diagnosis not present

## 2017-05-23 DIAGNOSIS — R0602 Shortness of breath: Secondary | ICD-10-CM | POA: Diagnosis not present

## 2017-05-23 DIAGNOSIS — I2510 Atherosclerotic heart disease of native coronary artery without angina pectoris: Secondary | ICD-10-CM | POA: Diagnosis not present

## 2017-06-21 DIAGNOSIS — R0602 Shortness of breath: Secondary | ICD-10-CM | POA: Diagnosis not present

## 2017-06-21 DIAGNOSIS — I2510 Atherosclerotic heart disease of native coronary artery without angina pectoris: Secondary | ICD-10-CM | POA: Diagnosis not present

## 2017-07-01 DIAGNOSIS — I10 Essential (primary) hypertension: Secondary | ICD-10-CM | POA: Diagnosis not present

## 2017-07-01 DIAGNOSIS — I252 Old myocardial infarction: Secondary | ICD-10-CM | POA: Diagnosis not present

## 2017-07-01 DIAGNOSIS — I2510 Atherosclerotic heart disease of native coronary artery without angina pectoris: Secondary | ICD-10-CM | POA: Diagnosis not present

## 2017-07-01 DIAGNOSIS — R0609 Other forms of dyspnea: Secondary | ICD-10-CM | POA: Diagnosis not present

## 2017-08-04 ENCOUNTER — Telehealth: Payer: Self-pay

## 2017-08-04 NOTE — Telephone Encounter (Signed)
I called pt. Pt's cpap data has not uploaded to [facility] since Feb of 2018. I asked pt to bring her cpap in to the appt. Pt is agreeable to this and verbalized understanding of appt date and time.

## 2017-08-08 ENCOUNTER — Encounter (INDEPENDENT_AMBULATORY_CARE_PROVIDER_SITE_OTHER): Payer: Self-pay

## 2017-08-08 ENCOUNTER — Encounter: Payer: Self-pay | Admitting: Neurology

## 2017-08-08 ENCOUNTER — Ambulatory Visit (INDEPENDENT_AMBULATORY_CARE_PROVIDER_SITE_OTHER): Payer: Medicare Other | Admitting: Neurology

## 2017-08-08 VITALS — BP 128/76 | HR 94 | Ht 67.0 in | Wt 155.0 lb

## 2017-08-08 DIAGNOSIS — G4733 Obstructive sleep apnea (adult) (pediatric): Secondary | ICD-10-CM

## 2017-08-08 DIAGNOSIS — Z9989 Dependence on other enabling machines and devices: Secondary | ICD-10-CM | POA: Diagnosis not present

## 2017-08-08 DIAGNOSIS — Z789 Other specified health status: Secondary | ICD-10-CM

## 2017-08-08 NOTE — Progress Notes (Signed)
Subjective:    Patient ID: Karina Foster is a 76 y.o. female.  HPI     Interim history:   Karina Foster is a 76 year old right-handed woman with an underlying medical history of hypertension, hyperlipidemia, diabetes, depression, reflux disease, iron deficiency, remote history of breast cancer, CAD (MI in 2010 and s/p 2 stents), who presents for follow-up consultation of her obstructive sleep apnea, on treatment with CPAP therapy at home. The patient is unaccompanied today. I last saw her on 08/05/2016, at which time she was compliant with CPAP. She had movement to a retirement community with her husband in independent living. I ordered new supplies and asked her to follow-up in one year routinely.  Today, 08/08/2017: I reviewed her CPAP compliance data from 07/10/2017 through 08/08/2017 which is a total of 30 days, during which time she used her CPAP only 12 days with percent used days greater than 4 hours at 40%, indicating overall suboptimal compliance with an average usage on days on treatment of 7 hours and 27 minutes, residual AHI 2.4 per hour, leak low with the 95th percentile at 5 L/m on a pressure of 8 cm with EPR of 2. In the past 90 days she had a compliance percentage of 60%, better but still suboptimal. She reports no changes in her medical history, but she has had some more trouble tolerating the CPAP particularly the head gear. She knows that she was much better with her compliance before but is struggling with it now. She would like to look into getting a oral appliance. She has an appointment with her dentist, Dr. Jule Ser next week and will talk to him about it. I would be happy to send my records and sleep study results to him and place a referral for evaluation and treatment for sleep apnea with a dental device.    The patient's allergies, current medications, family history, past medical history, past social history, past surgical history and problem list were reviewed and  updated as appropriate.   Previously (copied from previous notes for reference):   I saw her on 08/06/2015, at which time she was compliant with treatment. She was reasonably satisfied with how she was doing. She reported some short-term memory issues. She had some issues with tolerance of the nose mask. She was wondering if she should try a full facemask. I did suggest that we could try her on a different mask.   I reviewed her CPAP compliance data from 07/05/2016 through 08/03/2016 which is a total of 30 days, during which time she used her machine 27 days with percent used days greater than 4 hours at 90%, indicating excellent compliance with an average usage of 7 hours and 54 minutes, residual AHI 1.9 per hour, leak low with the 95th percentile at 2.2 L/m on a pressure of 8 cm with EPR of 2.      I saw her on 01/03/2015, at which time she reported that she was still trying to get adjusted to treatment. She had some problems falling asleep. She had just recently developed a new facial rash, likely from a new cream. She also had a recent emergency room visit on 12/26/2014 secondary to a laceration over her left thumb when she was cutting an apple.   I reviewed her CPAP compliance data from 07/05/2015 through 08/03/2015 which is a total of 30 days during which time she used her machine every night with percent used days greater than 4 hours at 87%, indicating very good  compliance with an average usage of 6 hours and 51 minutes, residual AHI acceptable at 3.2 per hour, leak acceptable with the 95th percentile at 12.1 L/m on a pressure of 8 cm with EPR of 2.   I first met her on 09/09/2014 at the request of her cardiologist, at which time she reported snoring and daytime somnolence as well as an abnormal pulse oximetry test which I reviewed at the time. I invited her back for sleep study. She had a split-night sleep study on 10/03/2014 and underwent over her test results with her in detail today. Her  baseline sleep efficiency was 62.9% with a latency to sleep of 69.5 minutes and wake after sleep onset of 20 minutes with severe sleep fragmentation noted. She had an elevated arousal index. She had an elevated percentage of stage II sleep, and absence of REM sleep prior to CPAP initiation. She had mild to moderate snoring. Total AHI was 22.7 per hour, average oxygen saturation was 93%, nadir was 84%. She was titrated on CPAP from 5-8 cm. She had slow-wave sleep and REM sleep during the second part of the study. Average oxygen saturation was 94%, nadir was 90%. She had no PLMS during the study on a pressure of 8 cm her AHI was 1.7 per hour. Based on the test results are prescribed CPAP therapy for home use.    I reviewed her CPAP compliance data from 12/03/2014 through 01/01/2015 which is a total of 30 days during which time she used her machine 28 days with percent used days greater than 4 hours at 87%, indicating very good compliance with an average usage for all days of 7 hours and 29 minutes, residual AHI at 2.5 per hour, leak low with the 95th percentile at 8.7 L/m on a pressure of 8 cm with EPR of 2.   I also reviewed her prior compliance data from 10/27/2014 through 11/25/2014 which is 30 days during which time she used her machine 29 days with percent used days greater than 4 hours at 93%, indicating excellent compliance with an average usage of 7 hours and 53 minutes, AHI 2.8, leak acceptable with the 95th percentile at 10.9 L/m and pressure at 8 cm with EPR of 2.   She had an overnight pulse oximetry test twice recently, 08/05/14 and 08/13/14, which I reviewed: during the first overnight oximetry test on 08/05/2014 she had a baseline oxygen saturation of 93.8%, a nadir of 88%, and it looks like after 3 AM her pulse rate consistently decreased. She had a repeat test on 08/13/2014 which had approximately an hour ofsignal and lowest oxygen saturation was 75% with time below 88% saturation of 96.7  minutes. Again, after approximately 5 AM her pulse rate decreased for some reason. She reports mild snoring. Her husband has not noted any witnessed apneas. She has no significant daytime somnolence with an Epworth of 4 out of 24 today. She denies restless leg symptoms but has occasional leg cramps at night. She has rare morning headaches. She goes to bed around 11 PM and watches the news while in bed. She falls asleep fairly quickly but does wake up in the middle of the night. She does not have to go to the bathroom and is not sure why she wakes up in the middle of the night. She does not wake up rested.  her wake time is 8 AM. She does not take any naps. She drinks coffee 2-3 cups in the morning. She does not typically drink  any sodas. She quit smoking in 1993. She drinks wine every day, 2-3 glasses per day. She has occasional vivid dreams but no nightmares. She has occasional sleep talking but no sleepwalking.     Her Past Medical History Is Significant For: Past Medical History:  Diagnosis Date  . Arteriosclerosis 12/16/08  . Breast cancer ([place]) 1997   history  . Depression   . Diabetes mellitus ([place])    uncontrolled   . Dyspnea on exertion 04/14/14  . GERD (gastroesophageal reflux disease)   . H/O coronary angiogram 12/16/08  . Hyperlipidemia   . Hypertension    benign essential  . Iron deficiency   . Myocardial infarction [facility]) 12/14/08    Her Past Surgical History Is Significant For: Past Surgical History:  Procedure Laterality Date  . BREAST SURGERY Right 1995   resection  . CATARACT EXTRACTION Bilateral 2014  . MASTECTOMY Right 1997   breast cancer  . TUBAL LIGATION  1977    Her Family History Is Significant For: Family History  Problem Relation Age of Onset  . Other Father        MI  . CVA Father   . Asthma Father   . Diabetes Mellitus II Father   . Hypertension Sister   . Osteoporosis Mother   . Breast cancer Mother   . Heart Problems Mother     Her Social  History Is Significant For: Social History   Socioeconomic History  . Marital status: Married    Spouse name: None  . Number of children: 1  . Years of education: Assoc   . Highest education level: None  Social Needs  . Financial resource strain: None  . Food insecurity - worry: None  . Food insecurity - inability: None  . Transportation needs - medical: None  . Transportation needs - non-medical: None  Occupational History    Comment: retired  Tobacco Use  . Smoking status: Former Research scientist (life sciences)  . Smokeless tobacco: Never Used  . Tobacco comment: quit in 1991  Substance and Sexual Activity  . Alcohol use: Yes    Alcohol/week: 0.0 oz    Comment: 2 drinks daily  . Drug use: No  . Sexual activity: None  Other Topics Concern  . None  Social History Narrative   3 caffeine drinks a day     Her Allergies Are:  Allergies  Allergen Reactions  . Azithromycin     rash  . Demerol [Meperidine]     vomiting  :   Her Current Medications Are:  Outpatient Encounter Medications as of 08/08/2017  Medication Sig  . aspirin 325 MG tablet Take 325 mg by mouth daily.  Marland Kitchen BYDUREON 2 MG PEN   . CELEXA 40 MG tablet   . CITALOPRAM HYDROBROMIDE PO Take 40 mg by mouth daily.  . meclizine (ANTIVERT) 25 MG tablet As needed  . metFORMIN (GLUCOPHAGE) 500 MG tablet Take 500 mg by mouth 3 (three) times daily with meals. 1 in AM, oral 2 in PM  . METOPROLOL TARTRATE PO Take 25 mg by mouth 2 (two) times daily.  . nitroGLYCERIN (NITROSTAT) 0.4 MG SL tablet Place 0.4 mg under the tongue every 5 (five) minutes as needed for chest pain.  . ramipril (ALTACE) 2.5 MG capsule Take 2.5 mg by mouth daily.  . ranitidine (ZANTAC) 150 MG capsule Take 150 mg by mouth 2 (two) times daily.  Marland Kitchen triamcinolone (KENALOG) 0.1 % paste Use as directed 1 application in the mouth or throat as needed.  Marland Kitchen  vitamin B-12 (CYANOCOBALAMIN) 1000 MCG tablet Take 1,000 mcg by mouth daily. ER   No facility-administered encounter medications  on file as of 08/08/2017.   :  Review of Systems:  Out of a complete 14 point review of systems, all are reviewed and negative with the exception of these symptoms as listed below: Review of Systems  Neurological:       Patient here for a follow up on her CPAP. She says that she has been doing well.     Objective:  Neurological Exam  Physical Exam Physical Examination:   Vitals:   08/08/17 1507  BP: 128/76  Pulse: 94    General Examination: The patient is a very pleasant 76 y.o. female in no acute distress. She appears well-developed and well-nourished and well groomed. Good spirits.   HEENT: Normocephalic, atraumatic, pupils are equal, round and reactive to light and accommodation. Extraocular tracking is good without limitation to gaze excursion or nystagmus noted. Normal smooth pursut is noted. Hearing is grossly intact with bilateral hearing aids. Normal facial animation and normal facial sensation. Speech is clear with no dysarthria noted. There is no hypophonia. There is no lip, neck/head, jaw or voice tremor. Neck is supple with full range of passive and active motion. Oropharynx exam reveals: moderate mouth dryness, adequate dental hygiene and mild airway crowding, due to  mildly redundant appearing soft palate. Uvula is small. Tonsils are small. Mallampati is class II.   Chest: Clear to auscultation without wheezing, rhonchi or crackles noted.  Heart: S1+S2+0, regular and normal without murmurs, rubs or gallops noted.   Abdomen: Soft, non-tender and non-distended with normal bowel sounds appreciated on auscultation.  Extremities: There is no pitting edema in the distal lower extremities bilaterally.   Skin: Warm and dry without trophic changes noted. There are no varicose veins.  Musculoskeletal: exam reveals no obvious joint deformities, tenderness or joint swelling or erythema.   Neurologically:  Mental status: The patient is awake, alert and oriented in all 4  spheres. Her immediate and remote memory, attention, language skills and fund of knowledge are appropriate. There is no evidence of aphasia, agnosia, apraxia or anomia. Speech is clear with normal prosody and enunciation. Thought process is linear. Mood is normal and affect is normal.  Cranial nerves II - XII are as described above under HEENT exam. In addition: shoulder shrug is normal with equal shoulder height noted. Motor exam: Normal bulk, strength and tone is noted. There is no drift, tremor or rebound. Romberg is negative. Reflexes are 1+ throughout, absent in the ankles. Fine motor skills and coordination: grossly intact with normal finger taps, normal hand movements, normal rapid alternating patting, normal foot taps and normal foot agility.  Cerebellar testing: No dysmetria or intention tremor on finger to nose testing. Heel to shin is unremarkable bilaterally. There is no truncal or gait ataxia.  Sensory exam: intact to light touch, in the upper and lower extremities, but decrease in PP sensation distal LEs.  Gait, station and balance: She stands easily. No veering to one side is noted. No leaning to one side is noted. Posture is age-appropriate and stance is narrow based. Gait shows normal stride length and normal pace. No problems turning are noted.    Assessment and plan:   In summary, JAZAE GANDOLFI is a very pleasant 76 year old female with an underlying medical history of hypertension, hyperlipidemia, diabetes, depression, reflux disease, iron deficiency, breast cancer (1997, s/p lumpectomy on the right, chemo and XRT), coronary artery  disease with history of MI in 2010 and s/p 2 stents, who presents for follow-up consultation of her diagnosis of OSA, on treatment with CPAP. She has previously had excellent compliance. Lately, particularly in the past 6 months she has struggled with her CPAP, particularly with her headgear. She feels that she has pressure areas in her face and also mouth  dryness and eye dryness. She is commended for her CPAP treatment adherence in the past and we mutually agreed to explore another option for her possible. She has an appointment with her dentist. They may be able to provide treatment with an oral appliance. She would like to look into this. I made a referral to her dentist Dr. Jule Ser. She had a split-night sleep study on 10/03/2014 which showed a total AHI of 22.7 at baseline, O2 nadir of 84%. We removed her compliance data as well. She has tried different masks over time. Should she decide to continue with CPAP I suggested a one-year checkup. I answered all her questions today and she was in agreement I spent 20 minutes in total face-to-face time with the patient, more than 50% of which was spent in counseling and coordination of care, reviewing test results, reviewing medication and discussing or reviewing the diagnosis of OSA, its prognosis and treatment options. Pertinent laboratory and imaging test results that were available during this visit with the patient were reviewed by me and considered in my medical decision making (see chart for details).

## 2017-08-08 NOTE — Patient Instructions (Addendum)
Please continue using your CPAP regularly. While your insurance requires that you use CPAP at least 4 hours each night on 70% of the nights, I recommend, that you not skip any nights and use it throughout the night if you can. Getting used to CPAP and staying with the treatment long term does take time and patience and discipline. Untreated obstructive sleep apnea when it is moderate to severe can have an adverse impact on cardiovascular health and raise her risk for heart disease, arrhythmias, hypertension, congestive heart failure, stroke and diabetes. Untreated obstructive sleep apnea causes sleep disruption, nonrestorative sleep, and sleep deprivation. This can have an impact on your day to day functioning and cause daytime sleepiness and impairment of cognitive function, memory loss, mood disturbance, and problems focussing. Using CPAP regularly can improve these symptoms.  I appreciate you trying CPAP, as your initial treatment option for obstructive sleep apnea, you have been compliant in the past, now are struggling with it. As discussed, your sleep study results from January 2016 indicated moderate obstructive sleep apnea by number of events. Unfortunately, you have struggled with CPAP, perhaps mostly the headgear. You have looked into trying an oral appliance. I would like to make a referral to Dr. Mariea Clonts, your dentist at this time for you to be considered for a custom-made oral appliance for treatment of obstructive sleep apnea.  We will send my records, and your sleep study results to his office. Should you decide to continue with the CPAP therapy, we will see you back in one year.

## 2017-09-07 DIAGNOSIS — I10 Essential (primary) hypertension: Secondary | ICD-10-CM | POA: Diagnosis not present

## 2017-09-07 DIAGNOSIS — H353121 Nonexudative age-related macular degeneration, left eye, early dry stage: Secondary | ICD-10-CM | POA: Diagnosis not present

## 2017-09-07 DIAGNOSIS — H52223 Regular astigmatism, bilateral: Secondary | ICD-10-CM | POA: Diagnosis not present

## 2017-09-07 DIAGNOSIS — H26493 Other secondary cataract, bilateral: Secondary | ICD-10-CM | POA: Diagnosis not present

## 2017-09-07 DIAGNOSIS — F39 Unspecified mood [affective] disorder: Secondary | ICD-10-CM | POA: Diagnosis not present

## 2017-09-07 DIAGNOSIS — Z6823 Body mass index (BMI) 23.0-23.9, adult: Secondary | ICD-10-CM | POA: Diagnosis not present

## 2017-09-07 DIAGNOSIS — I2510 Atherosclerotic heart disease of native coronary artery without angina pectoris: Secondary | ICD-10-CM | POA: Diagnosis not present

## 2017-09-07 DIAGNOSIS — G4733 Obstructive sleep apnea (adult) (pediatric): Secondary | ICD-10-CM | POA: Diagnosis not present

## 2017-09-07 DIAGNOSIS — H04123 Dry eye syndrome of bilateral lacrimal glands: Secondary | ICD-10-CM | POA: Diagnosis not present

## 2017-09-07 DIAGNOSIS — G6289 Other specified polyneuropathies: Secondary | ICD-10-CM | POA: Diagnosis not present

## 2017-09-07 DIAGNOSIS — H5213 Myopia, bilateral: Secondary | ICD-10-CM | POA: Diagnosis not present

## 2017-09-07 DIAGNOSIS — R0609 Other forms of dyspnea: Secondary | ICD-10-CM | POA: Diagnosis not present

## 2017-09-07 DIAGNOSIS — H524 Presbyopia: Secondary | ICD-10-CM | POA: Diagnosis not present

## 2017-09-07 DIAGNOSIS — E119 Type 2 diabetes mellitus without complications: Secondary | ICD-10-CM | POA: Diagnosis not present

## 2017-09-07 DIAGNOSIS — E1140 Type 2 diabetes mellitus with diabetic neuropathy, unspecified: Secondary | ICD-10-CM | POA: Diagnosis not present

## 2017-12-15 DIAGNOSIS — E1140 Type 2 diabetes mellitus with diabetic neuropathy, unspecified: Secondary | ICD-10-CM | POA: Diagnosis not present

## 2017-12-15 DIAGNOSIS — E7849 Other hyperlipidemia: Secondary | ICD-10-CM | POA: Diagnosis not present

## 2017-12-15 DIAGNOSIS — I10 Essential (primary) hypertension: Secondary | ICD-10-CM | POA: Diagnosis not present

## 2017-12-30 DIAGNOSIS — G4733 Obstructive sleep apnea (adult) (pediatric): Secondary | ICD-10-CM | POA: Diagnosis not present

## 2017-12-30 DIAGNOSIS — F39 Unspecified mood [affective] disorder: Secondary | ICD-10-CM | POA: Diagnosis not present

## 2017-12-30 DIAGNOSIS — G6289 Other specified polyneuropathies: Secondary | ICD-10-CM | POA: Diagnosis not present

## 2017-12-30 DIAGNOSIS — I2510 Atherosclerotic heart disease of native coronary artery without angina pectoris: Secondary | ICD-10-CM | POA: Diagnosis not present

## 2017-12-30 DIAGNOSIS — E7849 Other hyperlipidemia: Secondary | ICD-10-CM | POA: Diagnosis not present

## 2017-12-30 DIAGNOSIS — Z6823 Body mass index (BMI) 23.0-23.9, adult: Secondary | ICD-10-CM | POA: Diagnosis not present

## 2017-12-30 DIAGNOSIS — Z1389 Encounter for screening for other disorder: Secondary | ICD-10-CM | POA: Diagnosis not present

## 2017-12-30 DIAGNOSIS — Z0000 Encounter for general adult medical examination without abnormal findings: Secondary | ICD-10-CM | POA: Diagnosis not present

## 2017-12-30 DIAGNOSIS — J45909 Unspecified asthma, uncomplicated: Secondary | ICD-10-CM | POA: Diagnosis not present

## 2017-12-30 DIAGNOSIS — I10 Essential (primary) hypertension: Secondary | ICD-10-CM | POA: Diagnosis not present

## 2017-12-30 DIAGNOSIS — R413 Other amnesia: Secondary | ICD-10-CM | POA: Diagnosis not present

## 2017-12-30 DIAGNOSIS — E1140 Type 2 diabetes mellitus with diabetic neuropathy, unspecified: Secondary | ICD-10-CM | POA: Diagnosis not present

## 2018-01-13 DIAGNOSIS — F39 Unspecified mood [affective] disorder: Secondary | ICD-10-CM | POA: Diagnosis not present

## 2018-01-13 DIAGNOSIS — F329 Major depressive disorder, single episode, unspecified: Secondary | ICD-10-CM | POA: Diagnosis not present

## 2018-02-03 DIAGNOSIS — F39 Unspecified mood [affective] disorder: Secondary | ICD-10-CM | POA: Diagnosis not present

## 2018-02-03 DIAGNOSIS — F329 Major depressive disorder, single episode, unspecified: Secondary | ICD-10-CM | POA: Diagnosis not present

## 2018-02-24 DIAGNOSIS — F329 Major depressive disorder, single episode, unspecified: Secondary | ICD-10-CM | POA: Diagnosis not present

## 2018-02-24 DIAGNOSIS — F39 Unspecified mood [affective] disorder: Secondary | ICD-10-CM | POA: Diagnosis not present

## 2018-04-03 DIAGNOSIS — F39 Unspecified mood [affective] disorder: Secondary | ICD-10-CM | POA: Diagnosis not present

## 2018-04-14 DIAGNOSIS — H26491 Other secondary cataract, right eye: Secondary | ICD-10-CM | POA: Diagnosis not present

## 2018-04-14 DIAGNOSIS — H353132 Nonexudative age-related macular degeneration, bilateral, intermediate dry stage: Secondary | ICD-10-CM | POA: Diagnosis not present

## 2018-04-14 DIAGNOSIS — H52223 Regular astigmatism, bilateral: Secondary | ICD-10-CM | POA: Diagnosis not present

## 2018-04-14 DIAGNOSIS — H524 Presbyopia: Secondary | ICD-10-CM | POA: Diagnosis not present

## 2018-04-14 DIAGNOSIS — Z961 Presence of intraocular lens: Secondary | ICD-10-CM | POA: Diagnosis not present

## 2018-04-14 DIAGNOSIS — E119 Type 2 diabetes mellitus without complications: Secondary | ICD-10-CM | POA: Diagnosis not present

## 2018-05-11 DIAGNOSIS — Z6823 Body mass index (BMI) 23.0-23.9, adult: Secondary | ICD-10-CM | POA: Diagnosis not present

## 2018-05-11 DIAGNOSIS — G6289 Other specified polyneuropathies: Secondary | ICD-10-CM | POA: Diagnosis not present

## 2018-05-11 DIAGNOSIS — I10 Essential (primary) hypertension: Secondary | ICD-10-CM | POA: Diagnosis not present

## 2018-05-11 DIAGNOSIS — R0609 Other forms of dyspnea: Secondary | ICD-10-CM | POA: Diagnosis not present

## 2018-05-11 DIAGNOSIS — E1140 Type 2 diabetes mellitus with diabetic neuropathy, unspecified: Secondary | ICD-10-CM | POA: Diagnosis not present

## 2018-05-11 DIAGNOSIS — F39 Unspecified mood [affective] disorder: Secondary | ICD-10-CM | POA: Diagnosis not present

## 2018-05-11 DIAGNOSIS — Z23 Encounter for immunization: Secondary | ICD-10-CM | POA: Diagnosis not present

## 2018-07-18 DIAGNOSIS — L720 Epidermal cyst: Secondary | ICD-10-CM | POA: Diagnosis not present

## 2018-07-18 DIAGNOSIS — C44319 Basal cell carcinoma of skin of other parts of face: Secondary | ICD-10-CM | POA: Diagnosis not present

## 2018-08-07 ENCOUNTER — Telehealth: Payer: Self-pay

## 2018-08-07 NOTE — Telephone Encounter (Signed)
I called pt. She is not using her cpap but is using an oral appliance to treat osa. Pt still wants to keep her appt with Dr. Rexene Alberts tomorrow to discuss her osa. I reminded pt of her appt date and time and she verbalized understanding.

## 2018-08-08 ENCOUNTER — Ambulatory Visit (INDEPENDENT_AMBULATORY_CARE_PROVIDER_SITE_OTHER): Payer: Medicare Other | Admitting: Neurology

## 2018-08-08 ENCOUNTER — Encounter: Payer: Self-pay | Admitting: Neurology

## 2018-08-08 VITALS — BP 130/58 | HR 100 | Ht 67.0 in | Wt 152.0 lb

## 2018-08-08 DIAGNOSIS — Z85828 Personal history of other malignant neoplasm of skin: Secondary | ICD-10-CM | POA: Diagnosis not present

## 2018-08-08 DIAGNOSIS — L720 Epidermal cyst: Secondary | ICD-10-CM | POA: Diagnosis not present

## 2018-08-08 DIAGNOSIS — C44319 Basal cell carcinoma of skin of other parts of face: Secondary | ICD-10-CM | POA: Diagnosis not present

## 2018-08-08 DIAGNOSIS — G4733 Obstructive sleep apnea (adult) (pediatric): Secondary | ICD-10-CM

## 2018-08-08 DIAGNOSIS — Z789 Other specified health status: Secondary | ICD-10-CM | POA: Diagnosis not present

## 2018-08-08 NOTE — Progress Notes (Signed)
Subjective:    Patient ID: Karina Foster is a 77 y.o. female.  HPI     Interim history:   Karina Foster is a 77 year old right-handed woman with an underlying medical history of hypertension, hyperlipidemia, diabetes, depression, reflux disease, iron deficiency, remote history of breast cancer, CAD (MI in 2010 and s/p 2 stents), who presents for follow-up consultation of her obstructive sleep apnea. The patient is unaccompanied today. I last saw her on 08/08/2017, at which time she reported trouble tolerating the CPAP and the head gear. She was looking into getting an oral appliance.  Today, 08/08/2018: She reports that she no longer uses CPAP therapy and used an oral appliance fairly successfully until she started having cold symptoms and recent cough which prompted her to sleep without it and that's when her dog chewed on it and destroyed it. She would like to know if there is any other treatment options. We talked about surgical options including the implantable device, Inspire, but she declines ENT consultation at this time.   The patient's allergies, current medications, family history, past medical history, past social history, past surgical history and problem list were reviewed and updated as appropriate.    Previously (copied from previous notes for reference):   I saw her on 08/05/2016, at which time she was compliant with CPAP. She had movement to a retirement community with her husband in independent living. I ordered new supplies and asked her to follow-up in one year routinely.   I reviewed her CPAP compliance data from 07/10/2017 through 08/08/2017 which is a total of 30 days, during which time she used her CPAP only 12 days with percent used days greater than 4 hours at 40%, indicating overall suboptimal compliance with an average usage on days on treatment of 7 hours and 27 minutes, residual AHI 2.4 per hour, leak low with the 95th percentile at 5 L/m on a pressure of 8 cm with EPR  of 2. In the past 90 days she had a compliance percentage of 60%.   I saw her on 08/06/2015, at which time she was compliant with treatment. She was reasonably satisfied with how she was doing. She reported some short-term memory issues. She had some issues with tolerance of the nose mask. She was wondering if she should try a full facemask. I did suggest that we could try her on a different mask.   I reviewed her CPAP compliance data from 07/05/2016 through 08/03/2016 which is a total of 30 days, during which time she used her machine 27 days with percent used days greater than 4 hours at 90%, indicating excellent compliance with an average usage of 7 hours and 54 minutes, residual AHI 1.9 per hour, leak low with the 95th percentile at 2.2 L/m on a pressure of 8 cm with EPR of 2.    I saw her on 01/03/2015, at which time she reported that she was still trying to get adjusted to treatment. She had some problems falling asleep. She had just recently developed a new facial rash, likely from a new cream. She also had a recent emergency room visit on 12/26/2014 secondary to a laceration over her left thumb when she was cutting an apple.   I reviewed her CPAP compliance data from 07/05/2015 through 08/03/2015 which is a total of 30 days during which time she used her machine every night with percent used days greater than 4 hours at 87%, indicating very good compliance with an average usage of 6  hours and 51 minutes, residual AHI acceptable at 3.2 per hour, leak acceptable with the 95th percentile at 12.1 L/m on a pressure of 8 cm with EPR of 2.   I first met her on 09/09/2014 at the request of her cardiologist, at which time she reported snoring and daytime somnolence as well as an abnormal pulse oximetry test which I reviewed at the time. I invited her back for sleep study. She had a split-night sleep study on 10/03/2014 and underwent over her test results with her in detail today. Her baseline sleep  efficiency was 62.9% with a latency to sleep of 69.5 minutes and wake after sleep onset of 20 minutes with severe sleep fragmentation noted. She had an elevated arousal index. She had an elevated percentage of stage II sleep, and absence of REM sleep prior to CPAP initiation. She had mild to moderate snoring. Total AHI was 22.7 per hour, average oxygen saturation was 93%, nadir was 84%. She was titrated on CPAP from 5-8 cm. She had slow-wave sleep and REM sleep during the second part of the study. Average oxygen saturation was 94%, nadir was 90%. She had no PLMS during the study on a pressure of 8 cm her AHI was 1.7 per hour. Based on the test results are prescribed CPAP therapy for home use.    I reviewed her CPAP compliance data from 12/03/2014 through 01/01/2015 which is a total of 30 days during which time she used her machine 28 days with percent used days greater than 4 hours at 87%, indicating very good compliance with an average usage for all days of 7 hours and 29 minutes, residual AHI at 2.5 per hour, leak low with the 95th percentile at 8.7 L/m on a pressure of 8 cm with EPR of 2.   I also reviewed her prior compliance data from 10/27/2014 through 11/25/2014 which is 30 days during which time she used her machine 29 days with percent used days greater than 4 hours at 93%, indicating excellent compliance with an average usage of 7 hours and 53 minutes, AHI 2.8, leak acceptable with the 95th percentile at 10.9 L/m and pressure at 8 cm with EPR of 2.   She had an overnight pulse oximetry test twice recently, 08/05/14 and 08/13/14, which I reviewed: during the first overnight oximetry test on 08/05/2014 she had a baseline oxygen saturation of 93.8%, a nadir of 88%, and it looks like after 3 AM her pulse rate consistently decreased. She had a repeat test on 08/13/2014 which had approximately an hour ofsignal and lowest oxygen saturation was 75% with time below 88% saturation of 96.7 minutes. Again, after  approximately 5 AM her pulse rate decreased for some reason. She reports mild snoring. Her husband has not noted any witnessed apneas. She has no significant daytime somnolence with an Epworth of 4 out of 24 today. She denies restless leg symptoms but has occasional leg cramps at night. She has rare morning headaches. She goes to bed around 11 PM and watches the news while in bed. She falls asleep fairly quickly but does wake up in the middle of the night. She does not have to go to the bathroom and is not sure why she wakes up in the middle of the night. She does not wake up rested.  her wake time is 8 AM. She does not take any naps. She drinks coffee 2-3 cups in the morning. She does not typically drink any sodas. She quit smoking in 1993.  She drinks wine every day, 2-3 glasses per day. She has occasional vivid dreams but no nightmares. She has occasional sleep talking but no sleepwalking.    Her Past Medical History Is Significant For: Past Medical History:  Diagnosis Date  . Arteriosclerosis 12/16/08  . Breast cancer ([place]) 1997   history  . Depression   . Diabetes mellitus ([place])    uncontrolled   . Dyspnea on exertion 04/14/14  . GERD (gastroesophageal reflux disease)   . H/O coronary angiogram 12/16/08  . Hyperlipidemia   . Hypertension    benign essential  . Iron deficiency   . Myocardial infarction [facility]) 12/14/08    Her Past Surgical History Is Significant For: Past Surgical History:  Procedure Laterality Date  . BREAST SURGERY Right 1995   resection  . CATARACT EXTRACTION Bilateral 2014  . MASTECTOMY Right 1997   breast cancer  . TUBAL LIGATION  1977    Her Family History Is Significant For: Family History  Problem Relation Age of Onset  . Other Father        MI  . CVA Father   . Asthma Father   . Diabetes Mellitus II Father   . Hypertension Sister   . Osteoporosis Mother   . Breast cancer Mother   . Heart Problems Mother     Her Social History Is Significant  For: Social History   Socioeconomic History  . Marital status: Married    Spouse name: Not on file  . Number of children: 1  . Years of education: Assoc   . Highest education level: Not on file  Occupational History    Comment: retired  Scientific laboratory technician  . Financial resource strain: Not on file  . Food insecurity:    Worry: Not on file    Inability: Not on file  . Transportation needs:    Medical: Not on file    Non-medical: Not on file  Tobacco Use  . Smoking status: Former Research scientist (life sciences)  . Smokeless tobacco: Never Used  . Tobacco comment: quit in 1991  Substance and Sexual Activity  . Alcohol use: Yes    Alcohol/week: 0.0 standard drinks    Comment: 2 drinks daily  . Drug use: No  . Sexual activity: Not on file  Lifestyle  . Physical activity:    Days per week: Not on file    Minutes per session: Not on file  . Stress: Not on file  Relationships  . Social connections:    Talks on phone: Not on file    Gets together: Not on file    Attends religious service: Not on file    Active member of club or organization: Not on file    Attends meetings of clubs or organizations: Not on file    Relationship status: Not on file  Other Topics Concern  . Not on file  Social History Narrative   3 caffeine drinks a day     Her Allergies Are:  Allergies  Allergen Reactions  . Azithromycin     rash  . Demerol [Meperidine]     vomiting  :   Her Current Medications Are:  Outpatient Encounter Medications as of 08/08/2018  Medication Sig  . ARIPiprazole (ABILIFY) 2 MG tablet Take 2 mg by mouth daily.  Marland Kitchen aspirin 325 MG tablet Take 325 mg by mouth daily.  Marland Kitchen BYDUREON 2 MG PEN   . CELEXA 40 MG tablet   . CITALOPRAM HYDROBROMIDE PO Take 40 mg by mouth daily.  Marland Kitchen  meclizine (ANTIVERT) 25 MG tablet As needed  . metFORMIN (GLUCOPHAGE) 500 MG tablet Take 500 mg by mouth 3 (three) times daily with meals. 1 in AM, oral 2 in PM  . METOPROLOL TARTRATE PO Take 25 mg by mouth 2 (two) times daily.   . nitroGLYCERIN (NITROSTAT) 0.4 MG SL tablet Place 0.4 mg under the tongue every 5 (five) minutes as needed for chest pain.  . ramipril (ALTACE) 2.5 MG capsule Take 2.5 mg by mouth daily.  . ranitidine (ZANTAC) 150 MG capsule Take 150 mg by mouth 2 (two) times daily.  Marland Kitchen triamcinolone (KENALOG) 0.1 % paste Use as directed 1 application in the mouth or throat as needed.  . vitamin B-12 (CYANOCOBALAMIN) 1000 MCG tablet Take 1,000 mcg by mouth daily. ER   No facility-administered encounter medications on file as of 08/08/2018.   :  Review of Systems:  Out of a complete 14 point review of systems, all are reviewed and negative with the exception of these symptoms as listed below: Review of Systems  Neurological:       Pt presents today to discuss her osa. Pt stopped using her cpap and started using an oral appliance. Unfortunately, her dog chewed up both oral appliances. She is wondering if there are any other treatment options.    Objective:  Neurological Exam  Physical Exam Physical Examination:   Vitals:   08/08/18 1357  BP: (!) 130/58  Pulse: 100   General Examination: The patient is a very pleasant 77 y.o. female in no acute distress. She appears well-developed and well-nourished and well groomed.    HEENT:Normocephalic, atraumatic, pupils are equal, round and reactive to light and accommodation. Extraocular tracking is good without limitation to gaze excursion or nystagmus noted. Normal smooth pursut is noted. Hearing is grossly intact with bilateral hearing aids. Normal facial animation and normal facial sensation. Speech is clear with no dysarthria noted. There is no hypophonia. There is no lip, neck/head, jaw or voice tremor. Neck shows FROM. Oropharynx exam reveals: mild to moderate mouth dryness, adequate dental hygiene and mild airway crowding. Tongue protrudes centrally and palate elevates symmetrically.  Chest:Clear to auscultation without wheezing, rhonchi or crackles  noted.  Heart:S1+S2+0, regular and normal without murmurs, rubs or gallops noted.   Abdomen:Soft, non-tender and non-distended.  Extremities:There is no pitting edema in the distal lower extremities bilaterally.   Skin: Warm and dry without trophic changes noted.   Musculoskeletal: exam reveals no obvious joint deformities, tenderness or joint swelling or erythema.   Neurologically:  Mental status: The patient is awake, alert and oriented in all 4 spheres. Her immediate and remote memory, attention, language skills and fund of knowledge are appropriate. There is no evidence of aphasia, agnosia, apraxia or anomia. Speech is clear with normal prosody and enunciation. Thought process is linear. Mood is normal and affect is normal.  Cranial nerves II - XII are as described above under HEENT exam. Motor exam: Normal bulk, strength and tone is noted. There is no tremor. Fine motor skills and coordination: grossly intact.  Cerebellar testing: No dysmetria or intention tremor.  Sensory exam: intact to light touch.  Gait, station and balance: She stands easily. No veering to one side is noted. No leaning to one side is noted. Posture is age-appropriate and stance is narrow based. Gait shows normal stride length and normal pace. No problems turning are noted.   Assessment and plan:   In summary, Karina Foster is a very pleasant 77 year old female  with an underlying medical history of hypertension, hyperlipidemia, diabetes, depression, reflux disease, iron deficiency, breast cancer (1997, s/p lumpectomy on the right, chemo and XRT), coronary artery disease with history of MI in 2010 and s/p 2 stents, who presents for follow-up consultation of her diagnosis of OSA. She does not wish to try CPAP again. She had trouble tolerating CPAP in the past. She has been using an oral appliance until recently, her dog chewed on it. We talked about alternative treatment options. Weight has been stable over  the past few years. She declines a referral to ENT at this time. She indicates that she will make an appointment with her dentist again to get a replacement oral appliance. Of note, her split-night sleep study on 10/03/2014 showed a baseline total AHI of 22.7/hour and O2 nadir of 84%. I will see her back as needed.  I answered all her questions today and she was in agreement. I spent 15 minutes in total face-to-face time with the patient, more than 50% of which was spent in counseling and coordination of care, reviewing test results, reviewing medication and discussing or reviewing the diagnosis of OSA, its prognosis and treatment options. Pertinent laboratory and imaging test results that were available during this visit with the patient were reviewed by me and considered in my medical decision making (see chart for details).

## 2018-08-08 NOTE — Patient Instructions (Signed)
You have not been able to use CPAP in the past. You have tried an oral appliance. You can consider seeing an ENT specialist for surgical evaluation for OSA or for the [facility] device.  Please think about getting another oral appliance.

## 2018-08-17 DIAGNOSIS — I252 Old myocardial infarction: Secondary | ICD-10-CM | POA: Diagnosis not present

## 2018-08-17 DIAGNOSIS — I2510 Atherosclerotic heart disease of native coronary artery without angina pectoris: Secondary | ICD-10-CM | POA: Diagnosis not present

## 2018-08-17 DIAGNOSIS — R0609 Other forms of dyspnea: Secondary | ICD-10-CM | POA: Diagnosis not present

## 2018-08-17 DIAGNOSIS — I10 Essential (primary) hypertension: Secondary | ICD-10-CM | POA: Diagnosis not present

## 2018-08-18 DIAGNOSIS — Z4802 Encounter for removal of sutures: Secondary | ICD-10-CM | POA: Diagnosis not present

## 2018-09-13 DIAGNOSIS — H524 Presbyopia: Secondary | ICD-10-CM | POA: Diagnosis not present

## 2018-09-13 DIAGNOSIS — H26491 Other secondary cataract, right eye: Secondary | ICD-10-CM | POA: Diagnosis not present

## 2018-09-13 DIAGNOSIS — E119 Type 2 diabetes mellitus without complications: Secondary | ICD-10-CM | POA: Diagnosis not present

## 2018-09-13 DIAGNOSIS — H52223 Regular astigmatism, bilateral: Secondary | ICD-10-CM | POA: Diagnosis not present

## 2018-09-13 DIAGNOSIS — H04123 Dry eye syndrome of bilateral lacrimal glands: Secondary | ICD-10-CM | POA: Diagnosis not present

## 2018-09-13 DIAGNOSIS — H353132 Nonexudative age-related macular degeneration, bilateral, intermediate dry stage: Secondary | ICD-10-CM | POA: Diagnosis not present

## 2018-09-25 DIAGNOSIS — G6289 Other specified polyneuropathies: Secondary | ICD-10-CM | POA: Diagnosis not present

## 2018-09-25 DIAGNOSIS — E1140 Type 2 diabetes mellitus with diabetic neuropathy, unspecified: Secondary | ICD-10-CM | POA: Diagnosis not present

## 2018-09-25 DIAGNOSIS — K219 Gastro-esophageal reflux disease without esophagitis: Secondary | ICD-10-CM | POA: Diagnosis not present

## 2018-09-25 DIAGNOSIS — R413 Other amnesia: Secondary | ICD-10-CM | POA: Diagnosis not present

## 2018-09-25 DIAGNOSIS — F39 Unspecified mood [affective] disorder: Secondary | ICD-10-CM | POA: Diagnosis not present

## 2018-09-25 DIAGNOSIS — I10 Essential (primary) hypertension: Secondary | ICD-10-CM | POA: Diagnosis not present

## 2018-09-25 DIAGNOSIS — Z6823 Body mass index (BMI) 23.0-23.9, adult: Secondary | ICD-10-CM | POA: Diagnosis not present

## 2018-10-09 DIAGNOSIS — Z6823 Body mass index (BMI) 23.0-23.9, adult: Secondary | ICD-10-CM | POA: Diagnosis not present

## 2018-10-09 DIAGNOSIS — I10 Essential (primary) hypertension: Secondary | ICD-10-CM | POA: Diagnosis not present

## 2018-10-09 DIAGNOSIS — R600 Localized edema: Secondary | ICD-10-CM | POA: Diagnosis not present

## 2018-10-09 DIAGNOSIS — L729 Follicular cyst of the skin and subcutaneous tissue, unspecified: Secondary | ICD-10-CM | POA: Diagnosis not present

## 2018-10-10 ENCOUNTER — Encounter (HOSPITAL_COMMUNITY): Payer: Medicare Other

## 2018-10-10 DIAGNOSIS — R600 Localized edema: Secondary | ICD-10-CM | POA: Diagnosis not present

## 2018-10-17 DIAGNOSIS — R600 Localized edema: Secondary | ICD-10-CM | POA: Diagnosis not present

## 2018-10-17 DIAGNOSIS — E1140 Type 2 diabetes mellitus with diabetic neuropathy, unspecified: Secondary | ICD-10-CM | POA: Diagnosis not present

## 2018-10-17 DIAGNOSIS — L729 Follicular cyst of the skin and subcutaneous tissue, unspecified: Secondary | ICD-10-CM | POA: Diagnosis not present

## 2018-10-17 DIAGNOSIS — I10 Essential (primary) hypertension: Secondary | ICD-10-CM | POA: Diagnosis not present

## 2018-10-17 DIAGNOSIS — Z6823 Body mass index (BMI) 23.0-23.9, adult: Secondary | ICD-10-CM | POA: Diagnosis not present

## 2018-11-03 DIAGNOSIS — E1149 Type 2 diabetes mellitus with other diabetic neurological complication: Secondary | ICD-10-CM | POA: Diagnosis not present

## 2018-11-03 DIAGNOSIS — R600 Localized edema: Secondary | ICD-10-CM | POA: Diagnosis not present

## 2018-11-03 DIAGNOSIS — E1165 Type 2 diabetes mellitus with hyperglycemia: Secondary | ICD-10-CM | POA: Diagnosis not present

## 2018-11-03 DIAGNOSIS — Z6823 Body mass index (BMI) 23.0-23.9, adult: Secondary | ICD-10-CM | POA: Diagnosis not present

## 2018-11-09 DIAGNOSIS — E1165 Type 2 diabetes mellitus with hyperglycemia: Secondary | ICD-10-CM | POA: Diagnosis not present

## 2018-11-09 DIAGNOSIS — I10 Essential (primary) hypertension: Secondary | ICD-10-CM | POA: Diagnosis not present

## 2018-11-09 DIAGNOSIS — Z794 Long term (current) use of insulin: Secondary | ICD-10-CM | POA: Diagnosis not present

## 2018-11-30 DIAGNOSIS — L814 Other melanin hyperpigmentation: Secondary | ICD-10-CM | POA: Diagnosis not present

## 2018-11-30 DIAGNOSIS — D225 Melanocytic nevi of trunk: Secondary | ICD-10-CM | POA: Diagnosis not present

## 2018-11-30 DIAGNOSIS — I8312 Varicose veins of left lower extremity with inflammation: Secondary | ICD-10-CM | POA: Diagnosis not present

## 2018-11-30 DIAGNOSIS — I8311 Varicose veins of right lower extremity with inflammation: Secondary | ICD-10-CM | POA: Diagnosis not present

## 2018-11-30 DIAGNOSIS — L820 Inflamed seborrheic keratosis: Secondary | ICD-10-CM | POA: Diagnosis not present

## 2018-11-30 DIAGNOSIS — Z85828 Personal history of other malignant neoplasm of skin: Secondary | ICD-10-CM | POA: Diagnosis not present

## 2018-11-30 DIAGNOSIS — L570 Actinic keratosis: Secondary | ICD-10-CM | POA: Diagnosis not present

## 2018-11-30 DIAGNOSIS — I872 Venous insufficiency (chronic) (peripheral): Secondary | ICD-10-CM | POA: Diagnosis not present

## 2018-11-30 DIAGNOSIS — L821 Other seborrheic keratosis: Secondary | ICD-10-CM | POA: Diagnosis not present

## 2018-12-14 DIAGNOSIS — E1165 Type 2 diabetes mellitus with hyperglycemia: Secondary | ICD-10-CM | POA: Diagnosis not present

## 2018-12-14 DIAGNOSIS — Z794 Long term (current) use of insulin: Secondary | ICD-10-CM | POA: Diagnosis not present

## 2018-12-14 DIAGNOSIS — I10 Essential (primary) hypertension: Secondary | ICD-10-CM | POA: Diagnosis not present

## 2018-12-28 DIAGNOSIS — I10 Essential (primary) hypertension: Secondary | ICD-10-CM | POA: Diagnosis not present

## 2018-12-28 DIAGNOSIS — E1165 Type 2 diabetes mellitus with hyperglycemia: Secondary | ICD-10-CM | POA: Diagnosis not present

## 2018-12-28 DIAGNOSIS — Z794 Long term (current) use of insulin: Secondary | ICD-10-CM | POA: Diagnosis not present

## 2018-12-29 DIAGNOSIS — E1165 Type 2 diabetes mellitus with hyperglycemia: Secondary | ICD-10-CM | POA: Diagnosis not present

## 2018-12-29 DIAGNOSIS — I10 Essential (primary) hypertension: Secondary | ICD-10-CM | POA: Diagnosis not present

## 2018-12-29 DIAGNOSIS — E7849 Other hyperlipidemia: Secondary | ICD-10-CM | POA: Diagnosis not present

## 2019-01-04 DIAGNOSIS — I10 Essential (primary) hypertension: Secondary | ICD-10-CM | POA: Diagnosis not present

## 2019-01-04 DIAGNOSIS — R82998 Other abnormal findings in urine: Secondary | ICD-10-CM | POA: Diagnosis not present

## 2019-01-05 DIAGNOSIS — I2510 Atherosclerotic heart disease of native coronary artery without angina pectoris: Secondary | ICD-10-CM | POA: Diagnosis not present

## 2019-01-05 DIAGNOSIS — Z794 Long term (current) use of insulin: Secondary | ICD-10-CM | POA: Diagnosis not present

## 2019-01-05 DIAGNOSIS — J45909 Unspecified asthma, uncomplicated: Secondary | ICD-10-CM | POA: Diagnosis not present

## 2019-01-05 DIAGNOSIS — G4733 Obstructive sleep apnea (adult) (pediatric): Secondary | ICD-10-CM | POA: Diagnosis not present

## 2019-01-05 DIAGNOSIS — E1149 Type 2 diabetes mellitus with other diabetic neurological complication: Secondary | ICD-10-CM | POA: Diagnosis not present

## 2019-01-05 DIAGNOSIS — R413 Other amnesia: Secondary | ICD-10-CM | POA: Diagnosis not present

## 2019-01-05 DIAGNOSIS — G629 Polyneuropathy, unspecified: Secondary | ICD-10-CM | POA: Diagnosis not present

## 2019-01-05 DIAGNOSIS — J302 Other seasonal allergic rhinitis: Secondary | ICD-10-CM | POA: Diagnosis not present

## 2019-01-05 DIAGNOSIS — E785 Hyperlipidemia, unspecified: Secondary | ICD-10-CM | POA: Diagnosis not present

## 2019-01-05 DIAGNOSIS — Z0000 Encounter for general adult medical examination without abnormal findings: Secondary | ICD-10-CM | POA: Diagnosis not present

## 2019-01-05 DIAGNOSIS — F39 Unspecified mood [affective] disorder: Secondary | ICD-10-CM | POA: Diagnosis not present

## 2019-01-05 DIAGNOSIS — I10 Essential (primary) hypertension: Secondary | ICD-10-CM | POA: Diagnosis not present

## 2019-01-13 ENCOUNTER — Other Ambulatory Visit: Payer: Self-pay | Admitting: Cardiology

## 2019-01-15 NOTE — Telephone Encounter (Signed)
Please fill

## 2019-02-08 DIAGNOSIS — E1165 Type 2 diabetes mellitus with hyperglycemia: Secondary | ICD-10-CM | POA: Diagnosis not present

## 2019-02-08 DIAGNOSIS — Z794 Long term (current) use of insulin: Secondary | ICD-10-CM | POA: Diagnosis not present

## 2019-02-08 DIAGNOSIS — I10 Essential (primary) hypertension: Secondary | ICD-10-CM | POA: Diagnosis not present

## 2019-04-05 DIAGNOSIS — R42 Dizziness and giddiness: Secondary | ICD-10-CM | POA: Diagnosis not present

## 2019-04-05 DIAGNOSIS — E1149 Type 2 diabetes mellitus with other diabetic neurological complication: Secondary | ICD-10-CM | POA: Diagnosis not present

## 2019-04-05 DIAGNOSIS — I2510 Atherosclerotic heart disease of native coronary artery without angina pectoris: Secondary | ICD-10-CM | POA: Diagnosis not present

## 2019-04-11 ENCOUNTER — Other Ambulatory Visit: Payer: Self-pay | Admitting: Internal Medicine

## 2019-04-11 DIAGNOSIS — H9313 Tinnitus, bilateral: Secondary | ICD-10-CM

## 2019-04-11 DIAGNOSIS — J302 Other seasonal allergic rhinitis: Secondary | ICD-10-CM | POA: Diagnosis not present

## 2019-04-11 DIAGNOSIS — R42 Dizziness and giddiness: Secondary | ICD-10-CM

## 2019-04-11 DIAGNOSIS — I10 Essential (primary) hypertension: Secondary | ICD-10-CM | POA: Diagnosis not present

## 2019-05-01 DIAGNOSIS — H8110 Benign paroxysmal vertigo, unspecified ear: Secondary | ICD-10-CM | POA: Diagnosis not present

## 2019-05-01 DIAGNOSIS — H903 Sensorineural hearing loss, bilateral: Secondary | ICD-10-CM | POA: Diagnosis not present

## 2019-05-10 ENCOUNTER — Ambulatory Visit
Admission: RE | Admit: 2019-05-10 | Discharge: 2019-05-10 | Disposition: A | Discharge status: Home / Self Care | Payer: Medicare Other | Source: Ambulatory Visit | Attending: Internal Medicine | Admitting: Internal Medicine

## 2019-05-10 ENCOUNTER — Other Ambulatory Visit: Payer: Self-pay

## 2019-05-10 DIAGNOSIS — H9313 Tinnitus, bilateral: Secondary | ICD-10-CM

## 2019-05-10 DIAGNOSIS — R42 Dizziness and giddiness: Secondary | ICD-10-CM | POA: Diagnosis not present

## 2019-05-10 MED ORDER — GADOBENATE DIMEGLUMINE 529 MG/ML IV SOLN
15.0000 mL | Freq: Once | INTRAVENOUS | Status: AC | PRN
  Administered 2019-05-10: 15 mL via INTRAVENOUS

## 2019-05-15 DIAGNOSIS — H26491 Other secondary cataract, right eye: Secondary | ICD-10-CM | POA: Diagnosis not present

## 2019-05-15 DIAGNOSIS — H04123 Dry eye syndrome of bilateral lacrimal glands: Secondary | ICD-10-CM | POA: Diagnosis not present

## 2019-05-15 DIAGNOSIS — H524 Presbyopia: Secondary | ICD-10-CM | POA: Diagnosis not present

## 2019-05-15 DIAGNOSIS — Z961 Presence of intraocular lens: Secondary | ICD-10-CM | POA: Diagnosis not present

## 2019-05-15 DIAGNOSIS — E119 Type 2 diabetes mellitus without complications: Secondary | ICD-10-CM | POA: Diagnosis not present

## 2019-05-15 DIAGNOSIS — H52223 Regular astigmatism, bilateral: Secondary | ICD-10-CM | POA: Diagnosis not present

## 2019-05-15 DIAGNOSIS — H353132 Nonexudative age-related macular degeneration, bilateral, intermediate dry stage: Secondary | ICD-10-CM | POA: Diagnosis not present

## 2019-05-16 DIAGNOSIS — Z23 Encounter for immunization: Secondary | ICD-10-CM | POA: Diagnosis not present

## 2019-05-17 DIAGNOSIS — F39 Unspecified mood [affective] disorder: Secondary | ICD-10-CM | POA: Diagnosis not present

## 2019-05-17 DIAGNOSIS — E1149 Type 2 diabetes mellitus with other diabetic neurological complication: Secondary | ICD-10-CM | POA: Diagnosis not present

## 2019-05-17 DIAGNOSIS — R42 Dizziness and giddiness: Secondary | ICD-10-CM | POA: Diagnosis not present

## 2019-05-17 DIAGNOSIS — I10 Essential (primary) hypertension: Secondary | ICD-10-CM | POA: Diagnosis not present

## 2019-05-17 DIAGNOSIS — R413 Other amnesia: Secondary | ICD-10-CM | POA: Diagnosis not present

## 2019-05-17 DIAGNOSIS — G629 Polyneuropathy, unspecified: Secondary | ICD-10-CM | POA: Diagnosis not present

## 2019-05-21 DIAGNOSIS — R42 Dizziness and giddiness: Secondary | ICD-10-CM | POA: Diagnosis not present

## 2019-05-22 DIAGNOSIS — Z794 Long term (current) use of insulin: Secondary | ICD-10-CM | POA: Diagnosis not present

## 2019-05-22 DIAGNOSIS — I10 Essential (primary) hypertension: Secondary | ICD-10-CM | POA: Diagnosis not present

## 2019-05-22 DIAGNOSIS — E1165 Type 2 diabetes mellitus with hyperglycemia: Secondary | ICD-10-CM | POA: Diagnosis not present

## 2019-07-18 ENCOUNTER — Other Ambulatory Visit: Payer: Self-pay

## 2019-07-18 DIAGNOSIS — Z20822 Contact with and (suspected) exposure to covid-19: Secondary | ICD-10-CM

## 2019-07-19 LAB — NOVEL CORONAVIRUS, NAA: SARS-CoV-2, NAA: NOT DETECTED

## 2019-08-20 ENCOUNTER — Other Ambulatory Visit: Payer: Self-pay

## 2019-08-20 ENCOUNTER — Ambulatory Visit (INDEPENDENT_AMBULATORY_CARE_PROVIDER_SITE_OTHER): Payer: Medicare Other | Admitting: Cardiology

## 2019-08-20 ENCOUNTER — Encounter: Payer: Self-pay | Admitting: Cardiology

## 2019-08-20 VITALS — BP 120/70 | HR 80 | Temp 97.1°F | Ht 67.0 in | Wt 168.0 lb

## 2019-08-20 DIAGNOSIS — I10 Essential (primary) hypertension: Secondary | ICD-10-CM

## 2019-08-20 DIAGNOSIS — E7800 Pure hypercholesterolemia, unspecified: Secondary | ICD-10-CM | POA: Diagnosis not present

## 2019-08-20 DIAGNOSIS — R0600 Dyspnea, unspecified: Secondary | ICD-10-CM | POA: Diagnosis not present

## 2019-08-20 DIAGNOSIS — R0609 Other forms of dyspnea: Secondary | ICD-10-CM

## 2019-08-20 DIAGNOSIS — I2510 Atherosclerotic heart disease of native coronary artery without angina pectoris: Secondary | ICD-10-CM

## 2019-08-20 DIAGNOSIS — Z789 Other specified health status: Secondary | ICD-10-CM | POA: Diagnosis not present

## 2019-08-20 MED ORDER — NEXLIZET 180-10 MG PO TABS: 1.0000 | ORAL_TABLET | Freq: Every day | ORAL | 2 refills | Status: DC

## 2019-08-20 NOTE — Progress Notes (Signed)
Primary Physician/Referring:  [name], MD  Patient ID: Karina Foster, female    DOB: [birth year], 78 y.o.   MRN: [id number]  Chief Complaint  Patient presents with  . Coronary Artery Disease   HPI:    Karina Foster  is a 78 y.o.  Caucasian female with known coronary artery disease and angioplasty and stenting to the proximal and mid LAD in 2010 has a residual 80% large D1 stenosis, has had a low risk stress test in September 2018 who presents here for annual visit.  Past medical history significant for hypertension, hyperlipidemia, controlled diabetes mellitus.  She has chronic dyspnea which is having stable, blood pressure is well controlled. She does not want to be on statins stating that she develops severe dementia and altered mental status.   She denies CP, orthopnea, PND, or symptoms suggestive of TIA or claudication. No symptoms to suggest TIA or claudication, no painful swelling of the legs, no hemoptysis. No PND or orthopnea. No claudication or TIA. She states that her diabetes is well controlled.  Past Medical History:  Diagnosis Date  . Arteriosclerosis 12/16/08  . Breast cancer ([place]) 1997   history  . Depression   . Diabetes mellitus ([place])    uncontrolled   . Dyspnea on exertion 04/14/14  . GERD (gastroesophageal reflux disease)   . H/O coronary angiogram 12/16/08  . Hyperlipidemia   . Hypertension    benign essential  . Iron deficiency   . Myocardial infarction ([place]) 12/14/08   Past Surgical History:  Procedure Laterality Date  . BREAST SURGERY Right 1995   resection  . CATARACT EXTRACTION Bilateral 2014  . MASTECTOMY Right 1997   breast cancer  . TUBAL LIGATION  1977   Social History   Socioeconomic History  . Marital status: Married    Spouse name: Not on file  . Number of children: 1  . Years of education: Assoc   . Highest education level: Not on file  Occupational History    Comment: retired  Tobacco Use  . Smoking status: Former  Smoker    Packs/day: 1.00    Years: 20.00    Pack years: 20.00    Types: Cigarettes    Quit date: 1991    Years since quitting: 29.9  . Smokeless tobacco: Never Used  Substance and Sexual Activity  . Alcohol use: Yes    Alcohol/week: 0.0 standard drinks    Comment: 2 drinks daily  . Drug use: No  . Sexual activity: Not on file  Other Topics Concern  . Not on file  Social History Narrative   3 caffeine drinks a day    Social Determinants of Health   Financial Resource Strain:   . Difficulty of Paying Living Expenses: Not on file  Food Insecurity:   . Worried About Charity fundraiser in the Last Year: Not on file  . Ran Out of Food in the Last Year: Not on file  Transportation Needs:   . Lack of Transportation (Medical): Not on file  . Lack of Transportation (Non-Medical): Not on file  Physical Activity:   . Days of Exercise per Week: Not on file  . Minutes of Exercise per Session: Not on file  Stress:   . Feeling of Stress : Not on file  Social Connections:   . Frequency of Communication with Friends and Family: Not on file  . Frequency of Social Gatherings with Friends and Family: Not on file  . Attends Religious Services:  Not on file  . Active Member of Clubs or Organizations: Not on file  . Attends Archivist Meetings: Not on file  . Marital Status: Not on file  Intimate Partner Violence:   . Fear of Current or Ex-Partner: Not on file  . Emotionally Abused: Not on file  . Physically Abused: Not on file  . Sexually Abused: Not on file   ROS  Review of Systems  Constitution: Positive for weight gain (10 Lbs in 1 year). Negative for chills, decreased appetite and malaise/fatigue.  Cardiovascular: Negative for dyspnea on exertion, leg swelling and syncope.  Endocrine: Negative for cold intolerance.  Hematologic/Lymphatic: Does not bruise/bleed easily.  Musculoskeletal: Negative for joint swelling.  Gastrointestinal: Negative for abdominal pain, anorexia,  change in bowel habit, hematochezia and melena.  Neurological: Positive for vertigo (chronic). Negative for headaches and light-headedness.  Psychiatric/Behavioral: Positive for depression (stable) and memory loss. Negative for substance abuse. The patient is nervous/anxious.   All other systems reviewed and are negative.  Objective  Blood pressure 120/70, pulse 80, temperature (!) 97.1 F (36.2 C), height 5' 7" (1.702 m), weight 168 lb (76.2 kg), SpO2 98 %.  Vitals with BMI 08/20/2019 08/08/2018 08/08/2017  Height 5' 7" 5' 7" 5' 7"  Weight 168 lbs 152 lbs 155 lbs  BMI 26.31 62.2 29.79  Systolic 892 119 417  Diastolic 70 58 76  Pulse 80 100 94    Physical Exam  Constitutional:  She is moderately built and well-nourished in no acute distress.  HENT:  Head: Atraumatic.  Eyes: Conjunctivae are normal.  Neck: No JVD present. No thyromegaly present.  Cardiovascular: Normal rate, regular rhythm, normal heart sounds and intact distal pulses. Exam reveals no gallop.  No murmur heard. Bilateral 1-2+ ankle edema, no JVD.  Pulmonary/Chest: Effort normal and breath sounds normal.  Abdominal: Soft. Bowel sounds are normal.  Musculoskeletal:        General: Normal range of motion.     Cervical back: Neck supple.  Neurological: She is alert.  Skin: Skin is warm and dry.  Psychiatric: She has a normal mood and affect.   Laboratory examination:    No results for input(s): NA, K, CL, CO2, GLUCOSE, BUN, CREATININE, CALCIUM, GFRNONAA, GFRAA in the last 8760 hours. CrCl cannot be calculated (Patient's most recent lab result is older than the maximum 21 days allowed.).  CMP Latest Ref Rng & Units 12/18/2008 12/17/2008 12/16/2008  Glucose 70 - 99 mg/dL 147(H) 169(H) 155(H)  BUN 6 - 23 mg/dL _0 Creatinine 0.4 - 1.2 mg/dL 0.58 0.64 0.66  Sodium 135 - 145 mEq/L 140 134(L) 134(L)  Potassium 3.5 - 5.1 mEq/L 3.6 3.5 3.7  Chloride 96 - 112 mEq/L 103 100 101  CO2 19 - 32 mEq/L _1 Calcium 8.4  - 10.5 mg/dL 8.9 8.7 8.9   CBC Latest Ref Rng & Units 12/18/2008 12/17/2008 12/16/2008  WBC 4.0 - 10.5 K/uL 7.9 9.7 10.2  Hemoglobin 12.0 - 15.0 g/dL 10.1(L) 10.9(L) 10.6(L)  Hematocrit 36.0 - 46.0 % 30.7(L) 33.2(L) 31.9(L)  Platelets 150 - 400 K/uL 288 321 320    HEMOGLOBIN A1C No results found for: HGBA1C, MPG TSH No results for input(s): TSH in the last 8760 hours.   Labs 12/29/2018: Serum glucose 196 mg, BUN 14, creatinine 0.9, eGFR 61 mL, potassium 4.4.  CMP normal.   Lipid Panel  Total cholesterol: 59, triglycerides 164, HDL 84, LDL 142.  Nonobstructive cholesterol 175.  Apo lipoprotein  B 97, elevated.  Medications and allergies   Allergies  Allergen Reactions  . Azithromycin Rash    rash  . Demerol [Meperidine] Diarrhea    vomiting     Current Outpatient Medications  Medication Instructions  . ARIPiprazole (ABILIFY) 2 mg, Oral, Daily  . aspirin 325 mg, Oral, Daily  . Bempedoic Acid-Ezetimibe (NEXLIZET) 180-10 MG TABS 1 tablet, Oral, Daily  . CELEXA 40 MG tablet No dose, route, or frequency recorded.  Marland Kitchen CITALOPRAM HYDROBROMIDE PO 40 mg, Oral, Daily  . ezetimibe (ZETIA) 10 mg, Oral, Daily  . meclizine (ANTIVERT) 25 MG tablet As needed  . metFORMIN (GLUCOPHAGE) 500 mg, Oral, 3 times daily with meals, 1 in AM, oral 2 in PM   . metoprolol tartrate (LOPRESSOR) 25 MG tablet TAKE 1/2 TABLET TWICE DAILY  . nitroGLYCERIN (NITROSTAT) 0.4 mg, Sublingual, Every 5 min PRN  . ramipril (ALTACE) 2.5 mg, Oral, Daily  . ranitidine (ZANTAC) 150 mg, Oral, 2 times daily  . vitamin B-12 (CYANOCOBALAMIN) 1,000 mcg, Oral, Daily, ER    Radiology:  No results found.  Cardiac Studies:   Coronary angiogram 12/16/2008: Proximal LAD stenting with 2.25 x 16 mm Taxus DES in mid LAD and 3.0 x 12 Xience drug-eluting stent in proximal LAD.  Residual 80% in large diagonal 1. 40% mid RCA, 30% mid circumflex and   EF 50%.  Lexiscan myoview stress test 05/23/2017: 1. Pharmacologic stress testing was  performed with intravenous administration of .4 mg of Lexiscan over a 10-15 seconds infusion. Stress symptoms included dyspnea, dizziness. 2. Stress EKG is non diagnostic for ischemia as it is a pharmacologic stress.  3. The overall quality of the study is fair.  Left ventricular cavity is noted to be normal on the rest and stress studies.  The left ventricular ejection fraction was calculated or visually estimated to be 47%.  SPECT images demonstrate small perfusion abnormality of mild intensity in the basal anterior myocardial wall(s) on the stress and rest images. Perfusion defect improves on stress images, most likely representing attenuation artifact.  4. Given ejection fraction <50% in a diabetic patient, this is an intermediate risk study.  Echocardiogram 06/16/2017: Left ventricle cavity is normal in size. Mild concentric hypertrophy of the left ventricle. Mild decrease in global wall motion. Visual EF is 50-55%. Indeterminate diastolic function. Moderate (Grade II) aortic regurgitation. Mild aortic valve leaflet calcification. Mild interval increase in aortic regurgitation compared to echocardiogram in 2015.  Mild (Grade I) mitral regurgitation. Mildly restricted mitral valve leaflets. Mild tricuspid regurgitation. No evidence of pulmonary hypertension  Assessment     ICD-10-CM   1. Coronary artery disease involving native coronary artery of native heart without angina pectoris  I25.10 EKG 12-Lead  2. Dyspnea on exertion  R06.00   3. Primary hypertension  I10   4. Hypercholesteremia  E78.00 Bempedoic Acid-Ezetimibe (NEXLIZET) 180-10 MG TABS  5. Statin intolerance: memory loss  Z78.9     EKG 08/20/2019: Normal sinus rhythm at rate of 81 bpm, left axis deviation, borderline criteria for LVH.  No significant change from EKG 08/17/2018   Recommendations:   Meds ordered this encounter  Medications  . Bempedoic Acid-Ezetimibe (NEXLIZET) 180-10 MG TABS    Sig: Take 1 tablet by mouth  daily.    Dispense:  30 tablet    Refill:  2    KAHLEN BOYDE  is a  78 y.o.   Caucasian female with known coronary artery disease and angioplasty and stenting to the proximal and mid LAD  in 2010 has a residual 80% large D1 stenosis, has had a low risk stress test in September 2018 who presents here for annual visit.  Past medical history significant for hypertension, hyperlipidemia, controlled diabetes mellitus.   She is presently doing well without recurrence of angina pectoris, dyspnea is remained stable, blood pressure is well controlled.  I reviewed her recently performed labs, lipids are uncontrolled.  She is only on Zetia, high risk for cardio vascular events.  I'll start the patient on Nexlizet 180/100 mg daily, she is going for complete physical including labs and I requested Dr. Dagmar Hait to recheck her lipids.  I would like to see her back in 8 weeks for follow-up.  Adrian Prows, MD, [facility] 08/20/2019, 2:11 PM [facility] [place] Pager: [phone] Office: [phone]

## 2019-09-20 DIAGNOSIS — Z23 Encounter for immunization: Secondary | ICD-10-CM | POA: Diagnosis not present

## 2019-10-01 DIAGNOSIS — E1149 Type 2 diabetes mellitus with other diabetic neurological complication: Secondary | ICD-10-CM | POA: Diagnosis not present

## 2019-10-01 DIAGNOSIS — I2510 Atherosclerotic heart disease of native coronary artery without angina pectoris: Secondary | ICD-10-CM | POA: Diagnosis not present

## 2019-10-01 DIAGNOSIS — N3281 Overactive bladder: Secondary | ICD-10-CM | POA: Diagnosis not present

## 2019-10-01 DIAGNOSIS — F39 Unspecified mood [affective] disorder: Secondary | ICD-10-CM | POA: Diagnosis not present

## 2019-10-01 DIAGNOSIS — E785 Hyperlipidemia, unspecified: Secondary | ICD-10-CM | POA: Diagnosis not present

## 2019-10-01 DIAGNOSIS — R42 Dizziness and giddiness: Secondary | ICD-10-CM | POA: Diagnosis not present

## 2019-10-01 DIAGNOSIS — I10 Essential (primary) hypertension: Secondary | ICD-10-CM | POA: Diagnosis not present

## 2019-10-01 DIAGNOSIS — Z1331 Encounter for screening for depression: Secondary | ICD-10-CM | POA: Diagnosis not present

## 2019-10-01 DIAGNOSIS — R413 Other amnesia: Secondary | ICD-10-CM | POA: Diagnosis not present

## 2019-10-03 DIAGNOSIS — E1165 Type 2 diabetes mellitus with hyperglycemia: Secondary | ICD-10-CM | POA: Diagnosis not present

## 2019-10-03 DIAGNOSIS — E1149 Type 2 diabetes mellitus with other diabetic neurological complication: Secondary | ICD-10-CM | POA: Diagnosis not present

## 2019-10-17 DIAGNOSIS — Z23 Encounter for immunization: Secondary | ICD-10-CM | POA: Diagnosis not present

## 2019-10-24 ENCOUNTER — Other Ambulatory Visit: Payer: Self-pay

## 2019-10-24 ENCOUNTER — Encounter: Payer: Self-pay | Admitting: Cardiology

## 2019-10-24 ENCOUNTER — Ambulatory Visit: Payer: Medicare Other | Admitting: Cardiology

## 2019-10-24 VITALS — BP 122/70 | HR 99 | Temp 96.0°F | Ht 67.5 in | Wt 163.0 lb

## 2019-10-24 DIAGNOSIS — E7800 Pure hypercholesterolemia, unspecified: Secondary | ICD-10-CM | POA: Diagnosis not present

## 2019-10-24 DIAGNOSIS — I2510 Atherosclerotic heart disease of native coronary artery without angina pectoris: Secondary | ICD-10-CM

## 2019-10-24 MED ORDER — NEXLIZET 180-10 MG PO TABS: 1.0000 | ORAL_TABLET | Freq: Every day | ORAL | 3 refills | Status: AC

## 2019-10-24 NOTE — Progress Notes (Signed)
Primary Physician/Referring:  [name], MD  Patient ID: Karina Foster, female    DOB: [birth year], 79 y.o.   MRN: [id number]  Chief Complaint  Patient presents with  . Coronary Artery Disease    2 month f/u  . Hyperlipidemia   HPI:    Karina Foster  is a 79 y.o.  Caucasian female with known coronary artery disease and angioplasty and stenting to the proximal and mid LAD in 2010 has a residual 80% large D1 stenosis, has had a low risk stress test in September 2018 who presents here for annual visit.  Past medical history significant for hypertension, hyperlipidemia, controlled diabetes mellitus. She does not want to be on statins stating that she develops severe dementia and altered mental status. She has chronic dyspnea which is stable, blood pressure is well controlled.  She denies CP, orthopnea, PND, or symptoms suggestive of TIA or claudication.    She now presents to f/u on lipids. She was started on Nexlizet on her prior OV which she is tolerating.   Past Medical History:  Diagnosis Date  . Arteriosclerosis 12/16/08  . Breast cancer ([place]) 1997   history  . Depression   . Diabetes mellitus ([place])    uncontrolled   . Dyspnea on exertion 04/14/14  . GERD (gastroesophageal reflux disease)   . H/O coronary angiogram 12/16/08  . Hyperlipidemia   . Hypertension    benign essential  . Iron deficiency   . Myocardial infarction ([place]) 12/14/08   Past Surgical History:  Procedure Laterality Date  . BREAST SURGERY Right 1995   resection  . CATARACT EXTRACTION Bilateral 2014  . MASTECTOMY Right 1997   breast cancer  . TUBAL LIGATION  1977   Social History   Socioeconomic History  . Marital status: Married    Spouse name: Not on file  . Number of children: 1  . Years of education: Assoc   . Highest education level: Not on file  Occupational History    Comment: retired  Tobacco Use  . Smoking status: Former Smoker    Packs/day: 1.00    Years: 20.00    Pack  years: 20.00    Types: Cigarettes    Quit date: 1991    Years since quitting: 30.1  . Smokeless tobacco: Never Used  Substance and Sexual Activity  . Alcohol use: Yes    Alcohol/week: 0.0 standard drinks    Comment: 2 drinks daily  . Drug use: No  . Sexual activity: Not on file  Other Topics Concern  . Not on file  Social History Narrative   3 caffeine drinks a day    Social Determinants of Health   Financial Resource Strain:   . Difficulty of Paying Living Expenses: Not on file  Food Insecurity:   . Worried About Charity fundraiser in the Last Year: Not on file  . Ran Out of Food in the Last Year: Not on file  Transportation Needs:   . Lack of Transportation (Medical): Not on file  . Lack of Transportation (Non-Medical): Not on file  Physical Activity:   . Days of Exercise per Week: Not on file  . Minutes of Exercise per Session: Not on file  Stress:   . Feeling of Stress : Not on file  Social Connections:   . Frequency of Communication with Friends and Family: Not on file  . Frequency of Social Gatherings with Friends and Family: Not on file  . Attends Religious Services: Not  on file  . Active Member of Clubs or Organizations: Not on file  . Attends Archivist Meetings: Not on file  . Marital Status: Not on file  Intimate Partner Violence:   . Fear of Current or Ex-Partner: Not on file  . Emotionally Abused: Not on file  . Physically Abused: Not on file  . Sexually Abused: Not on file   ROS  Review of Systems  Cardiovascular: Negative for dyspnea on exertion and leg swelling.  Musculoskeletal: Positive for arthritis.  Gastrointestinal: Positive for bowel incontinence. Negative for melena.   Objective  Blood pressure 122/70, pulse 99, temperature (!) 96 F (35.6 C), height 5' 7.5" (1.715 m), weight 163 lb (73.9 kg), SpO2 95 %.  Vitals with BMI 10/24/2019 10/24/2019 08/20/2019  Height 5' 7.5" 5' 7.5" '5\' 7"'   Weight 163 lbs 163 lbs 14 oz 168 lbs  BMI  25.14 62.22 97.98  Systolic 921 194 174  Diastolic 70 76 70  Pulse 99 100 80    Physical Exam  Constitutional:  She is moderately built and well-nourished in no acute distress.  Neck: No JVD present.  Cardiovascular: Normal rate, regular rhythm, normal heart sounds and intact distal pulses. Exam reveals no gallop.  No murmur heard. Bilateral 1-2+ ankle edema, no JVD.  Pulmonary/Chest: Effort normal and breath sounds normal.  Abdominal: Soft. Bowel sounds are normal.  Musculoskeletal:        General: Normal range of motion.  Psychiatric: She has a normal mood and affect.   Laboratory examination:   Labs 08/28/2019: Cholesterol, total 183.000 m 08/28/2019 HDL 71 MG/DL 08/28/2019 LDL 78.000 mg 08/28/2019 Triglycerides 169.000 08/28/2019  A1C 7.400 % 10/03/2019  Hemoglobin 13.900 g/ 04/11/2019 Creatinine, Serum 0.900 mg/ 08/28/2019 Potassium N/D Magnesium N/D ALT (SGPT) 38.000 uni 08/28/2019  Labs 12/29/2018: Serum glucose 196 mg, BUN 14, creatinine 0.9, eGFR 61 mL, potassium 4.4.  CMP normal.   Lipid Panel  Total cholesterol: 59, triglycerides 164, HDL 84, LDL 142.  Nonobstructive cholesterol 175.  Apo lipoprotein B 97, elevated.  Medications and allergies   Allergies  Allergen Reactions  . Azithromycin Rash    rash  . Demerol [Meperidine] Diarrhea    vomiting     Current Outpatient Medications  Medication Instructions  . ARIPiprazole (ABILIFY) 2 mg, Oral, Daily  . aspirin 325 mg, Oral, Daily  . Bempedoic Acid-Ezetimibe (NEXLIZET) 180-10 MG TABS 1 tablet, Oral, Daily  . CELEXA 40 MG tablet No dose, route, or frequency recorded.  . HumaLOG KwikPen 15 Units, Subcutaneous, 3 times daily  . metFORMIN (GLUCOPHAGE) 500 mg, Oral, 3 times daily with meals, 1 in AM, oral 2 in PM   . metoprolol tartrate (LOPRESSOR) 25 MG tablet TAKE 1/2 TABLET TWICE DAILY  . Myrbetriq 50 mg, Oral, Daily  . nitroGLYCERIN (NITROSTAT) 0.4 mg, Sublingual, Every 5 min PRN  . ramipril (ALTACE)  2.5 mg, Oral, Daily  . ranitidine (ZANTAC) 150 mg, Oral, 2 times daily  . Tresiba FlexTouch 96 Units, Subcutaneous, Daily  . vitamin B-12 (CYANOCOBALAMIN) 1,000 mcg, Oral, Daily, ER    Radiology:  No results found.  Cardiac Studies:   Coronary angiogram 12/16/2008: Proximal LAD stenting with 2.25 x 16 mm Taxus DES in mid LAD and 3.0 x 12 Xience drug-eluting stent in proximal LAD.  Residual 80% in large diagonal 1. 40% mid RCA, 30% mid circumflex and   EF 50%.  Lexiscan myoview stress test 05/23/2017: 1. Pharmacologic stress testing was performed with intravenous administration of .4 mg of  Lexiscan over a 10-15 seconds infusion. Stress symptoms included dyspnea, dizziness. 2. Stress EKG is non diagnostic for ischemia as it is a pharmacologic stress.  3. The overall quality of the study is fair.  Left ventricular cavity is noted to be normal on the rest and stress studies.  The left ventricular ejection fraction was calculated or visually estimated to be 47%.  SPECT images demonstrate small perfusion abnormality of mild intensity in the basal anterior myocardial wall(s) on the stress and rest images. Perfusion defect improves on stress images, most likely representing attenuation artifact.  4. Given ejection fraction <50% in a diabetic patient, this is an intermediate risk study.  Echocardiogram 06/16/2017: Left ventricle cavity is normal in size. Mild concentric hypertrophy of the left ventricle. Mild decrease in global wall motion. Visual EF is 50-55%. Indeterminate diastolic function. Moderate (Grade II) aortic regurgitation. Mild aortic valve leaflet calcification. Mild interval increase in aortic regurgitation compared to echocardiogram in 2015.  Mild (Grade I) mitral regurgitation. Mildly restricted mitral valve leaflets. Mild tricuspid regurgitation. No evidence of pulmonary hypertension  Assessment     ICD-10-CM   1. Coronary artery disease involving native coronary artery of native  heart without angina pectoris  I25.10   2. Hypercholesteremia  E78.00 Bempedoic Acid-Ezetimibe (NEXLIZET) 180-10 MG TABS    EKG 08/20/2019: Normal sinus rhythm at rate of 81 bpm, left axis deviation, borderline criteria for LVH.  No significant change from EKG 08/17/2018   Recommendations:   Meds ordered this encounter  Medications  . Bempedoic Acid-Ezetimibe (NEXLIZET) 180-10 MG TABS    Sig: Take 1 tablet by mouth daily.    Dispense:  90 tablet    Refill:  3    Karina Foster  is a  79 y.o.   Caucasian female with hypertension, hyperlipidemia, uncontrolled diabetes mellitus and coronary artery disease and angioplasty and stenting to the proximal and mid LAD in 2010 has a residual 80% large D1 stenosis, has had a low risk stress test in September 2018 who presents here for evaluation of  Hyperlipidemia. She does not want to be on statins stating that she develops severe dementia and altered mental status.   She is presently doing well without recurrence of angina pectoris, dyspnea is remained stable, blood pressure is well controlled. She is now tolerating Nexlizet 180/100 mg daily with marked improvement in LDL.  She could add flaxseed flakes to her meal which will help with further reduction in LDL.  Advised system from cardiac standpoint, I will see her back in a year.  Over the past several weeks to few months, she has noticed bowel incontinence, advised her to hold Metformin for 2 weeks and inform Dr. Dagmar Hait if symptoms improve.  Her DM is also much improved now with A1c 7.5.   Adrian Prows, MD, [facility] 10/27/2019, 5:07 PM [facility]. [place] Office: [phone]

## 2019-11-13 DIAGNOSIS — H26491 Other secondary cataract, right eye: Secondary | ICD-10-CM | POA: Diagnosis not present

## 2019-11-13 DIAGNOSIS — Z961 Presence of intraocular lens: Secondary | ICD-10-CM | POA: Diagnosis not present

## 2019-11-13 DIAGNOSIS — E119 Type 2 diabetes mellitus without complications: Secondary | ICD-10-CM | POA: Diagnosis not present

## 2019-11-13 DIAGNOSIS — H353132 Nonexudative age-related macular degeneration, bilateral, intermediate dry stage: Secondary | ICD-10-CM | POA: Diagnosis not present

## 2019-11-13 DIAGNOSIS — H04123 Dry eye syndrome of bilateral lacrimal glands: Secondary | ICD-10-CM | POA: Diagnosis not present

## 2019-11-21 DIAGNOSIS — I10 Essential (primary) hypertension: Secondary | ICD-10-CM | POA: Diagnosis not present

## 2019-11-21 DIAGNOSIS — E1165 Type 2 diabetes mellitus with hyperglycemia: Secondary | ICD-10-CM | POA: Diagnosis not present

## 2019-11-21 DIAGNOSIS — Z794 Long term (current) use of insulin: Secondary | ICD-10-CM | POA: Diagnosis not present

## 2019-12-05 DIAGNOSIS — L570 Actinic keratosis: Secondary | ICD-10-CM | POA: Diagnosis not present

## 2019-12-05 DIAGNOSIS — L814 Other melanin hyperpigmentation: Secondary | ICD-10-CM | POA: Diagnosis not present

## 2019-12-05 DIAGNOSIS — Z85828 Personal history of other malignant neoplasm of skin: Secondary | ICD-10-CM | POA: Diagnosis not present

## 2019-12-05 DIAGNOSIS — L821 Other seborrheic keratosis: Secondary | ICD-10-CM | POA: Diagnosis not present

## 2019-12-05 DIAGNOSIS — L720 Epidermal cyst: Secondary | ICD-10-CM | POA: Diagnosis not present

## 2020-01-08 DIAGNOSIS — E7849 Other hyperlipidemia: Secondary | ICD-10-CM | POA: Diagnosis not present

## 2020-01-08 DIAGNOSIS — G4733 Obstructive sleep apnea (adult) (pediatric): Secondary | ICD-10-CM | POA: Diagnosis not present

## 2020-01-08 DIAGNOSIS — J069 Acute upper respiratory infection, unspecified: Secondary | ICD-10-CM | POA: Diagnosis not present

## 2020-01-08 DIAGNOSIS — J302 Other seasonal allergic rhinitis: Secondary | ICD-10-CM | POA: Diagnosis not present

## 2020-01-08 DIAGNOSIS — E1165 Type 2 diabetes mellitus with hyperglycemia: Secondary | ICD-10-CM | POA: Diagnosis not present

## 2020-01-08 DIAGNOSIS — J029 Acute pharyngitis, unspecified: Secondary | ICD-10-CM | POA: Diagnosis not present

## 2020-01-08 DIAGNOSIS — J45909 Unspecified asthma, uncomplicated: Secondary | ICD-10-CM | POA: Diagnosis not present

## 2020-01-14 DIAGNOSIS — I2510 Atherosclerotic heart disease of native coronary artery without angina pectoris: Secondary | ICD-10-CM | POA: Diagnosis not present

## 2020-01-14 DIAGNOSIS — G629 Polyneuropathy, unspecified: Secondary | ICD-10-CM | POA: Diagnosis not present

## 2020-01-14 DIAGNOSIS — Z0000 Encounter for general adult medical examination without abnormal findings: Secondary | ICD-10-CM | POA: Diagnosis not present

## 2020-01-14 DIAGNOSIS — F39 Unspecified mood [affective] disorder: Secondary | ICD-10-CM | POA: Diagnosis not present

## 2020-01-14 DIAGNOSIS — R413 Other amnesia: Secondary | ICD-10-CM | POA: Diagnosis not present

## 2020-01-14 DIAGNOSIS — I10 Essential (primary) hypertension: Secondary | ICD-10-CM | POA: Diagnosis not present

## 2020-01-14 DIAGNOSIS — E1149 Type 2 diabetes mellitus with other diabetic neurological complication: Secondary | ICD-10-CM | POA: Diagnosis not present

## 2020-01-14 DIAGNOSIS — Z1331 Encounter for screening for depression: Secondary | ICD-10-CM | POA: Diagnosis not present

## 2020-01-14 DIAGNOSIS — G4733 Obstructive sleep apnea (adult) (pediatric): Secondary | ICD-10-CM | POA: Diagnosis not present

## 2020-01-14 DIAGNOSIS — J302 Other seasonal allergic rhinitis: Secondary | ICD-10-CM | POA: Diagnosis not present

## 2020-01-14 DIAGNOSIS — J45909 Unspecified asthma, uncomplicated: Secondary | ICD-10-CM | POA: Diagnosis not present

## 2020-01-14 DIAGNOSIS — E785 Hyperlipidemia, unspecified: Secondary | ICD-10-CM | POA: Diagnosis not present

## 2020-01-24 ENCOUNTER — Other Ambulatory Visit: Payer: Self-pay | Admitting: Cardiology

## 2020-04-25 DIAGNOSIS — H353132 Nonexudative age-related macular degeneration, bilateral, intermediate dry stage: Secondary | ICD-10-CM | POA: Diagnosis not present

## 2020-04-25 DIAGNOSIS — Z961 Presence of intraocular lens: Secondary | ICD-10-CM | POA: Diagnosis not present

## 2020-04-25 DIAGNOSIS — H26491 Other secondary cataract, right eye: Secondary | ICD-10-CM | POA: Diagnosis not present

## 2020-04-25 DIAGNOSIS — E119 Type 2 diabetes mellitus without complications: Secondary | ICD-10-CM | POA: Diagnosis not present

## 2020-05-29 DIAGNOSIS — J302 Other seasonal allergic rhinitis: Secondary | ICD-10-CM | POA: Diagnosis not present

## 2020-05-29 DIAGNOSIS — R413 Other amnesia: Secondary | ICD-10-CM | POA: Diagnosis not present

## 2020-05-29 DIAGNOSIS — E1149 Type 2 diabetes mellitus with other diabetic neurological complication: Secondary | ICD-10-CM | POA: Diagnosis not present

## 2020-05-29 DIAGNOSIS — E785 Hyperlipidemia, unspecified: Secondary | ICD-10-CM | POA: Diagnosis not present

## 2020-05-29 DIAGNOSIS — I10 Essential (primary) hypertension: Secondary | ICD-10-CM | POA: Diagnosis not present

## 2020-05-29 DIAGNOSIS — I2510 Atherosclerotic heart disease of native coronary artery without angina pectoris: Secondary | ICD-10-CM | POA: Diagnosis not present

## 2020-05-29 DIAGNOSIS — N3281 Overactive bladder: Secondary | ICD-10-CM | POA: Diagnosis not present

## 2020-05-29 DIAGNOSIS — G629 Polyneuropathy, unspecified: Secondary | ICD-10-CM | POA: Diagnosis not present

## 2020-05-29 DIAGNOSIS — F39 Unspecified mood [affective] disorder: Secondary | ICD-10-CM | POA: Diagnosis not present

## 2020-05-29 DIAGNOSIS — G4733 Obstructive sleep apnea (adult) (pediatric): Secondary | ICD-10-CM | POA: Diagnosis not present

## 2020-05-29 DIAGNOSIS — J45909 Unspecified asthma, uncomplicated: Secondary | ICD-10-CM | POA: Diagnosis not present

## 2020-05-29 DIAGNOSIS — Z23 Encounter for immunization: Secondary | ICD-10-CM | POA: Diagnosis not present

## 2020-06-04 DIAGNOSIS — H02834 Dermatochalasis of left upper eyelid: Secondary | ICD-10-CM | POA: Diagnosis not present

## 2020-06-04 DIAGNOSIS — H02831 Dermatochalasis of right upper eyelid: Secondary | ICD-10-CM | POA: Diagnosis not present

## 2020-06-30 DIAGNOSIS — Z23 Encounter for immunization: Secondary | ICD-10-CM | POA: Diagnosis not present

## 2020-07-15 DIAGNOSIS — Z794 Long term (current) use of insulin: Secondary | ICD-10-CM | POA: Diagnosis not present

## 2020-07-15 DIAGNOSIS — E1149 Type 2 diabetes mellitus with other diabetic neurological complication: Secondary | ICD-10-CM | POA: Diagnosis not present

## 2020-07-15 DIAGNOSIS — I10 Essential (primary) hypertension: Secondary | ICD-10-CM | POA: Diagnosis not present

## 2020-08-22 DIAGNOSIS — E1165 Type 2 diabetes mellitus with hyperglycemia: Secondary | ICD-10-CM | POA: Diagnosis not present

## 2020-09-04 DIAGNOSIS — K121 Other forms of stomatitis: Secondary | ICD-10-CM | POA: Diagnosis not present

## 2020-09-29 DIAGNOSIS — E785 Hyperlipidemia, unspecified: Secondary | ICD-10-CM | POA: Diagnosis not present

## 2020-09-29 DIAGNOSIS — K148 Other diseases of tongue: Secondary | ICD-10-CM | POA: Diagnosis not present

## 2020-09-29 DIAGNOSIS — I2510 Atherosclerotic heart disease of native coronary artery without angina pectoris: Secondary | ICD-10-CM | POA: Diagnosis not present

## 2020-09-29 DIAGNOSIS — N3281 Overactive bladder: Secondary | ICD-10-CM | POA: Diagnosis not present

## 2020-09-29 DIAGNOSIS — F39 Unspecified mood [affective] disorder: Secondary | ICD-10-CM | POA: Diagnosis not present

## 2020-09-29 DIAGNOSIS — I10 Essential (primary) hypertension: Secondary | ICD-10-CM | POA: Diagnosis not present

## 2020-09-29 DIAGNOSIS — G4733 Obstructive sleep apnea (adult) (pediatric): Secondary | ICD-10-CM | POA: Diagnosis not present

## 2020-09-29 DIAGNOSIS — R413 Other amnesia: Secondary | ICD-10-CM | POA: Diagnosis not present

## 2020-09-29 DIAGNOSIS — J302 Other seasonal allergic rhinitis: Secondary | ICD-10-CM | POA: Diagnosis not present

## 2020-09-29 DIAGNOSIS — G629 Polyneuropathy, unspecified: Secondary | ICD-10-CM | POA: Diagnosis not present

## 2020-09-29 DIAGNOSIS — J45909 Unspecified asthma, uncomplicated: Secondary | ICD-10-CM | POA: Diagnosis not present

## 2020-09-29 DIAGNOSIS — E1149 Type 2 diabetes mellitus with other diabetic neurological complication: Secondary | ICD-10-CM | POA: Diagnosis not present

## 2020-10-23 ENCOUNTER — Ambulatory Visit: Payer: Medicare Other | Admitting: Cardiology

## 2020-10-23 ENCOUNTER — Other Ambulatory Visit: Payer: Self-pay

## 2020-10-23 ENCOUNTER — Encounter: Payer: Self-pay | Admitting: Cardiology

## 2020-10-23 VITALS — BP 116/70 | HR 87 | Temp 97.9°F | Resp 16 | Ht 67.5 in | Wt 164.4 lb

## 2020-10-23 DIAGNOSIS — I2510 Atherosclerotic heart disease of native coronary artery without angina pectoris: Secondary | ICD-10-CM

## 2020-10-23 DIAGNOSIS — I10 Essential (primary) hypertension: Secondary | ICD-10-CM | POA: Diagnosis not present

## 2020-10-23 DIAGNOSIS — E7800 Pure hypercholesterolemia, unspecified: Secondary | ICD-10-CM

## 2020-10-23 DIAGNOSIS — Z789 Other specified health status: Secondary | ICD-10-CM

## 2020-10-23 NOTE — Progress Notes (Signed)
Primary Physician/Referring:  [name], MD  Patient ID: Karina Foster, female    DOB: [birth year], 80 y.o.   MRN: [id number]  Chief Complaint  Patient presents with  . Coronary Artery Disease  . Hyperlipidemia  . Follow-up    1 Year   HPI:    Karina Foster  is a 80 y.o.  Caucasian female with known coronary artery disease and angioplasty and stenting to the proximal and mid LAD in 2010 has a residual 80% large D1 stenosis, has had a low risk stress test in September 2018 who presents here for annual visit.  Past medical history significant for hypertension, hyperlipidemia, controlled diabetes mellitus. She is statin intolerant and develops severe dementia and altered mental status.   She has chronic dyspnea which is stable, blood pressure is well controlled.  She denies CP, orthopnea, PND, or symptoms suggestive of TIA or claudication.  Except for urinary frequency and incontinence she has no specific complaints today.  Past Medical History:  Diagnosis Date  . Arteriosclerosis 12/16/08  . Breast cancer ([employer]) 1997   history  . Coronary artery disease   . Depression   . Diabetes mellitus ([place])    uncontrolled   . Dyspnea on exertion 04/14/14  . GERD (gastroesophageal reflux disease)   . H/O coronary angiogram 12/16/08  . Hyperlipidemia   . Hypertension    benign essential  . Iron deficiency   . Myocardial infarction ([place]) 12/14/08   Past Surgical History:  Procedure Laterality Date  . BREAST SURGERY Right 1995   resection  . CATARACT EXTRACTION Bilateral 2014  . MASTECTOMY Right 1997   breast cancer  . TUBAL LIGATION  1977   Social History   Tobacco Use  . Smoking status: Former Smoker    Packs/day: 1.00    Years: 20.00    Pack years: 20.00    Types: Cigarettes    Quit date: 1991    Years since quitting: 31.1  . Smokeless tobacco: Never Used  Substance Use Topics  . Alcohol use: Yes    Alcohol/week: 0.0 standard drinks    Comment: 2 drinks daily   Marital Status: Married    ROS  Review of Systems  Cardiovascular: Negative for dyspnea on exertion and leg swelling.  Musculoskeletal: Positive for arthritis.  Gastrointestinal: Negative for melena.  Genitourinary: Positive for bladder incontinence and frequency.   Objective  Blood pressure 116/70, pulse 87, temperature 97.9 F (36.6 C), temperature source Temporal, resp. rate 16, height 5' 7.5" (1.715 m), weight 164 lb 6.4 oz (74.6 kg), SpO2 98 %.  Vitals with BMI 10/23/2020 10/24/2019 10/24/2019  Height 5' 7.5" 5' 7.5" 5' 7.5"  Weight 164 lbs 6 oz 163 lbs 163 lbs 14 oz  BMI 25.35 50.93 26.71  Systolic 245 809 983  Diastolic 70 70 76  Pulse 87 99 100    Physical Exam Constitutional:      Appearance: Normal appearance.     Comments: She is moderately built and well-nourished in no acute distress.  Neck:     Vascular: No JVD.  Cardiovascular:     Rate and Rhythm: Normal rate and regular rhythm.     Pulses: Intact distal pulses.     Heart sounds: Normal heart sounds. No murmur heard. No gallop.      Comments: No edema, no JVD. Pulmonary:     Effort: Pulmonary effort is normal.     Breath sounds: Normal breath sounds.  Abdominal:     General: Bowel  sounds are normal.     Palpations: Abdomen is soft.  Musculoskeletal:        General: Normal range of motion.    Laboratory examination:   Cholesterol, total 240.000 m 01/08/2020 HDL 84 MG/DL 01/08/2020 LDL 119.000 m 01/08/2020 Triglycerides 183.000 01/08/2020  A1C 7.500 % 09/29/2020 TSH 3.790 01/08/2020  Hemoglobin 14.900 g/d 01/08/2020  Creatinine, Serum 1.000 mg/ 01/08/2020 Potassium 4.400 MEQ 01/08/2020 ALT (SGPT) 67.000 uni 01/08/2020   Labs 08/28/2019: Cholesterol, total 183.000 m 08/28/2019 HDL 71 MG/DL 08/28/2019 LDL 78.000 mg 08/28/2019 Triglycerides 169.000 08/28/2019   A1C 7.400 % 10/03/2019  Hemoglobin 13.900 g/ 04/11/2019 Creatinine, Serum 0.900 mg/ 08/28/2019 Potassium N/D Magnesium N/D ALT (SGPT) 38.000 uni  08/28/2019  Labs 12/29/2018: Serum glucose 196 mg, BUN 14, creatinine 0.9, eGFR 61 mL, potassium 4.4.  CMP normal.   Lipid Panel  Total cholesterol: 59, triglycerides 164, HDL 84, LDL 142.  Nonobstructive cholesterol 175.  Apo lipoprotein B 97, elevated.  Medications and allergies   Allergies  Allergen Reactions  . Azithromycin Rash    rash  . Demerol [Meperidine] Diarrhea    vomiting    Current Outpatient Medications on File Prior to Visit  Medication Sig Dispense Refill  . ARIPiprazole (ABILIFY) 2 MG tablet Take 2 mg by mouth daily.    Marland Kitchen aspirin 325 MG tablet Take 325 mg by mouth daily.    . Bempedoic Acid-Ezetimibe (NEXLIZET) 180-10 MG TABS Take 1 tablet by mouth daily. 90 tablet 3  . CELEXA 40 MG tablet     . HUMALOG KWIKPEN 100 UNIT/ML KwikPen Inject 15 Units into the skin in the morning, at noon, and at bedtime.    . metFORMIN (GLUCOPHAGE) 500 MG tablet Take 500 mg by mouth 3 (three) times daily with meals. 1 in AM, oral 2 in PM    . metoprolol tartrate (LOPRESSOR) 25 MG tablet TAKE 1/2 TABLET TWICE DAILY 180 tablet 3  . MYRBETRIQ 50 MG TB24 tablet Take 50 mg by mouth daily.    . nitroGLYCERIN (NITROSTAT) 0.4 MG SL tablet Place 0.4 mg under the tongue every 5 (five) minutes as needed for chest pain.    . ramipril (ALTACE) 2.5 MG capsule Take 2.5 mg by mouth daily.    . ranitidine (ZANTAC) 150 MG capsule Take 150 mg by mouth every other day.    . TRESIBA FLEXTOUCH 200 UNIT/ML SOPN Inject 96 Units into the skin daily.    . vitamin B-12 (CYANOCOBALAMIN) 1000 MCG tablet Take 1,000 mcg by mouth daily. ER     No current facility-administered medications on file prior to visit.    Radiology:  No results found.  Cardiac Studies:   Coronary angiogram 12/16/2008: Proximal LAD stenting with 2.25 x 16 mm Taxus DES in mid LAD and 3.0 x 12 Xience drug-eluting stent in proximal LAD.  Residual 80% in large diagonal 1. 40% mid RCA, 30% mid circumflex and   EF 50%.  Lexiscan myoview stress  test 05/23/2017: 1. Pharmacologic stress testing was performed with intravenous administration of .4 mg of Lexiscan over a 10-15 seconds infusion. Stress symptoms included dyspnea, dizziness. 2. Stress EKG is non diagnostic for ischemia as it is a pharmacologic stress.  3. The overall quality of the study is fair.  Left ventricular cavity is noted to be normal on the rest and stress studies.  The left ventricular ejection fraction was calculated or visually estimated to be 47%.  SPECT images demonstrate small perfusion abnormality of mild intensity in the basal anterior  myocardial wall(s) on the stress and rest images. Perfusion defect improves on stress images, most likely representing attenuation artifact.  4. Given ejection fraction <50% in a diabetic patient, this is an intermediate risk study.  Echocardiogram 06/16/2017: Left ventricle cavity is normal in size. Mild concentric hypertrophy of the left ventricle. Mild decrease in global wall motion. Visual EF is 50-55%. Indeterminate diastolic function. Moderate (Grade II) aortic regurgitation. Mild aortic valve leaflet calcification. Mild interval increase in aortic regurgitation compared to echocardiogram in 2015.  Mild (Grade I) mitral regurgitation. Mildly restricted mitral valve leaflets. Mild tricuspid regurgitation. No evidence of pulmonary hypertension.  EKG:   EKG 10/23/2020: Normal sinus rhythm at rate of 85 bpm, left atrial enlargement, left axis deviation, left intrafascicular block.  Poor R wave progression, probably normal variant.  LVH.  No significant change from 08/20/2019.  Assessment     ICD-10-CM   1. Coronary artery disease involving native coronary artery of native heart without angina pectoris  I25.10 EKG 12-Lead  2. Hypercholesteremia  E78.00   3. Primary hypertension  I10   4. Statin intolerance: memory loss  Z78.9     No orders of the defined types were placed in this encounter. There are no discontinued  medications.  Orders Placed This Encounter  Procedures  . EKG 12-Lead     Recommendations:   No orders of the defined types were placed in this encounter.   Karina Foster  is a  80 y.o.   Caucasian female with hypertension, hyperlipidemia, uncontrolled diabetes mellitus and coronary artery disease and angioplasty and stenting to the proximal and mid LAD in 2010 has a residual 80% large D1 stenosis, has had a low risk stress test in September 2018.  Statin intolerant and presently on Nexlizet.  She is presently doing well without recurrence of angina pectoris, dyspnea is remained stable, blood pressure is well controlled. She is now tolerating Nexlizet 180/100 mg daily, needs lipids checked and she prefers to have it done with next annual physical soon.    Adrian Prows, MD, [facility] 10/23/2020, 12:14 PM Office: [phone] Pager: [phone]

## 2020-10-29 DIAGNOSIS — R300 Dysuria: Secondary | ICD-10-CM | POA: Diagnosis not present

## 2020-10-29 DIAGNOSIS — N390 Urinary tract infection, site not specified: Secondary | ICD-10-CM | POA: Diagnosis not present

## 2020-10-29 DIAGNOSIS — I10 Essential (primary) hypertension: Secondary | ICD-10-CM | POA: Diagnosis not present

## 2020-10-29 DIAGNOSIS — E1149 Type 2 diabetes mellitus with other diabetic neurological complication: Secondary | ICD-10-CM | POA: Diagnosis not present

## 2020-10-29 DIAGNOSIS — Z794 Long term (current) use of insulin: Secondary | ICD-10-CM | POA: Diagnosis not present

## 2020-11-04 DIAGNOSIS — H26491 Other secondary cataract, right eye: Secondary | ICD-10-CM | POA: Diagnosis not present

## 2020-11-04 DIAGNOSIS — H353132 Nonexudative age-related macular degeneration, bilateral, intermediate dry stage: Secondary | ICD-10-CM | POA: Diagnosis not present

## 2020-11-04 DIAGNOSIS — H04123 Dry eye syndrome of bilateral lacrimal glands: Secondary | ICD-10-CM | POA: Diagnosis not present

## 2020-11-04 DIAGNOSIS — E119 Type 2 diabetes mellitus without complications: Secondary | ICD-10-CM | POA: Diagnosis not present

## 2020-11-04 DIAGNOSIS — H524 Presbyopia: Secondary | ICD-10-CM | POA: Diagnosis not present

## 2020-11-04 DIAGNOSIS — H52223 Regular astigmatism, bilateral: Secondary | ICD-10-CM | POA: Diagnosis not present

## 2020-11-19 IMAGING — MR MR HEAD WO/W CM
11 of 12 series · 37 of 48 positions shown · IV contrast (multihance)
Comparison: No pertinent prior studies available for comparison.

CLINICAL DATA: Tinnitus, bilateral, vertigo.

Creatinine was obtained on site at [HOSPITAL] at [HOSPITAL].
Results: Creatinine 0.8 mg/dL (GFR 72).
EXAM:
MRI HEAD WITHOUT AND WITH CONTRAST
TECHNIQUE: Multiplanar, multiecho pulse sequences of the brain and surrounding
structures were obtained without and with intravenous contrast.
CONTRAST:  15mL MULTIHANCE GADOBENATE DIMEGLUMINE 529 MG/ML IV SOLN

[Series 2: T1 · sagittal · 5.0mm · 0.45mm/px · 3 of 21 slices shown (1 of 4)]
[im 1/21]
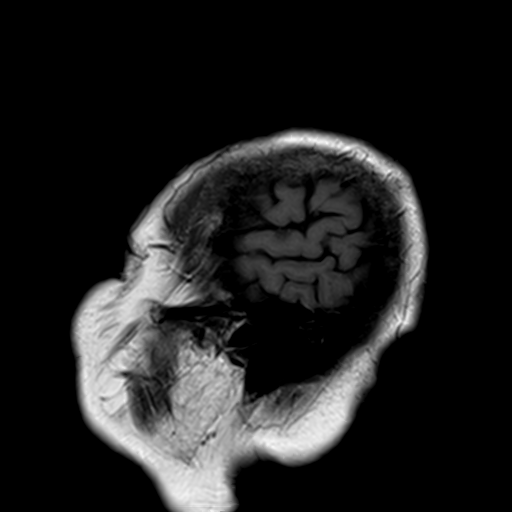
[im 11/21]
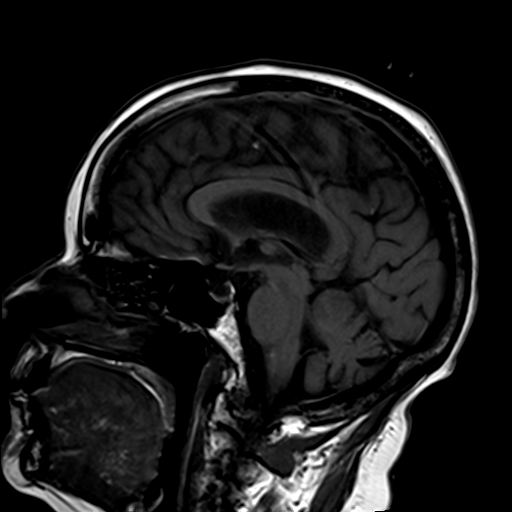
[im 21/21]
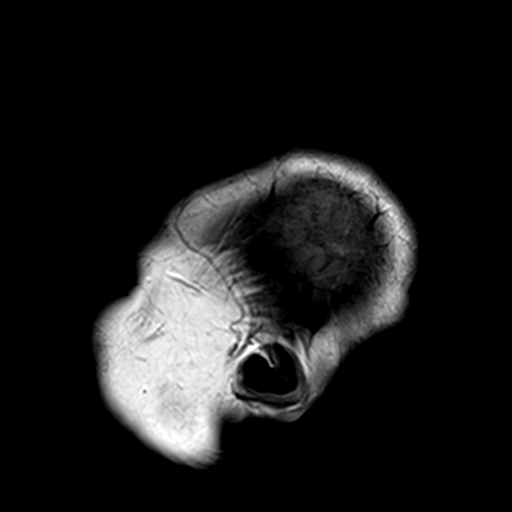

[Series 3: ep2d_diff_3 · axial · 3.0mm · 1.80mm/px · z∈[-9,+126]mm · 8 of 99 slices shown]
[im 1/99]
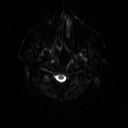
[im 11/99]
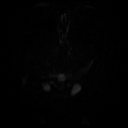
[im 33/99]
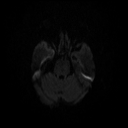
[im 44/99]
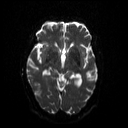
[im 55/99]
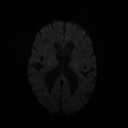
[im 66/99]
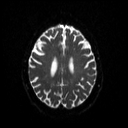
[im 88/99]
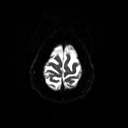
[im 99/99]
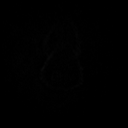

[Series 4: ep2d_diff_3_adc · axial · 3.0mm · 1.80mm/px · z∈[-9,+126]mm · 5 of 47 slices shown]
[im 1/47]
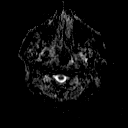
[im 12/47]
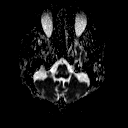
[im 24/47]
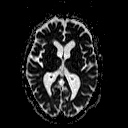
[im 35/47]
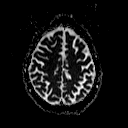
[im 47/47]
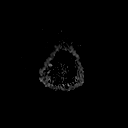

[Series 5: t2_tse_tra_512 · axial · 5.0mm · 0.60mm/px · z∈[-8,+123]mm · 2 of 24 slices shown]
[im 1/24]
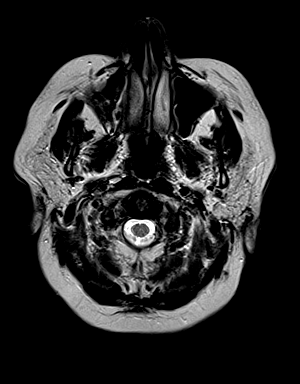
[im 24/24]
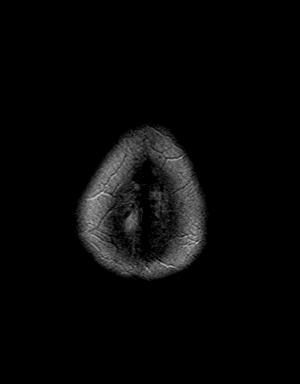

[Series 7: swi_images · axial · 2.0mm · 0.90mm/px · z∈[-14,+130]mm · 8 of 80 slices shown]
[im 1/80]
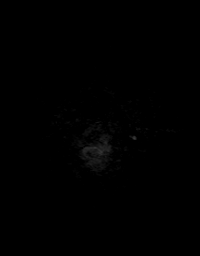
[im 12/80]
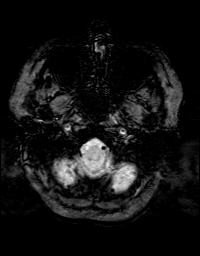
[im 23/80]
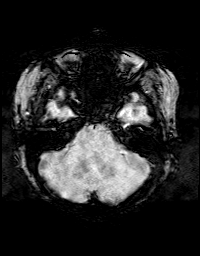
[im 34/80]
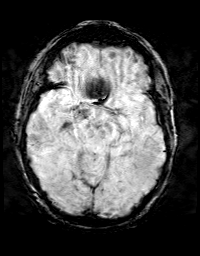
[im 46/80]
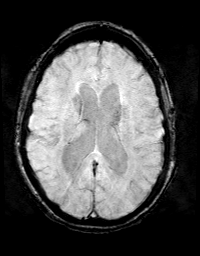
[im 57/80]
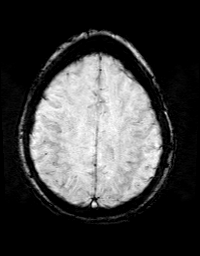
[im 68/80]
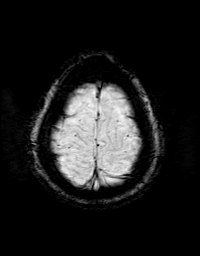
[im 80/80]
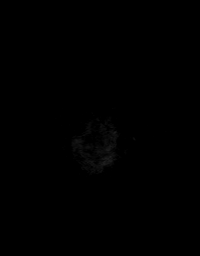

[Series 8: FLAIR · axial · 3.0mm · 0.43mm/px · z∈[-15,+128]mm · 3 of 27 slices shown]
[im 1/27]
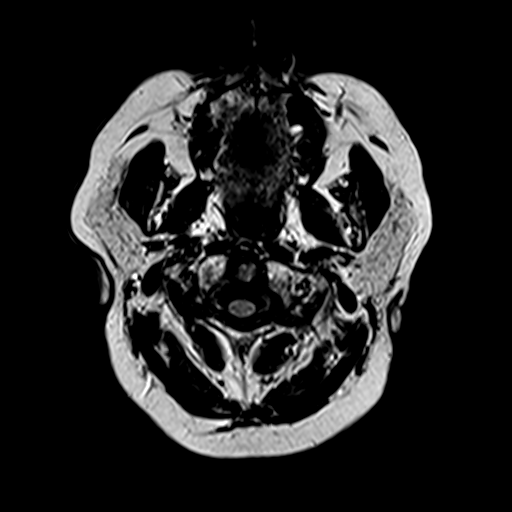
[im 14/27]
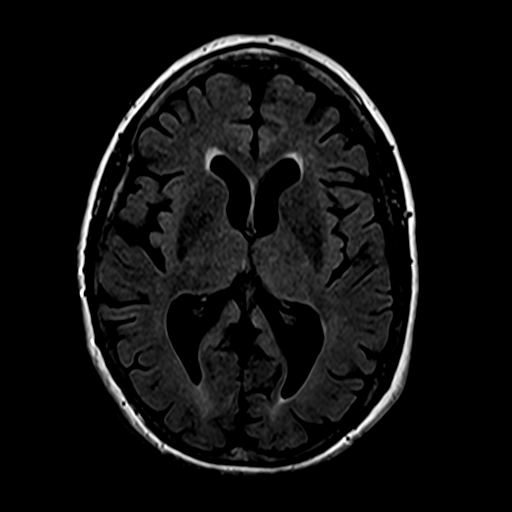
[im 27/27]
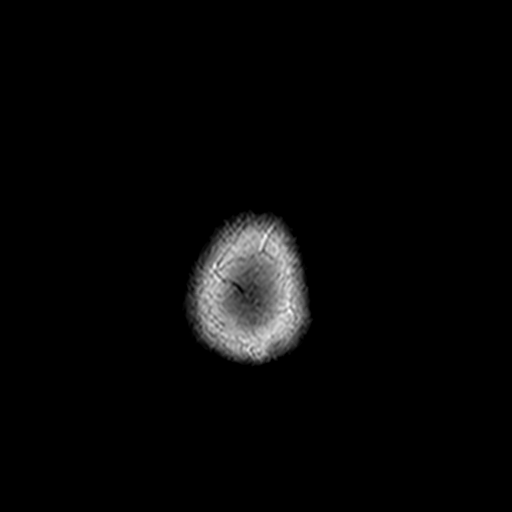

[Series 9: T1 · coronal · 3.0mm · 0.35mm/px · 1 of 11 slices shown (2 of 4)]
[im 1/11]
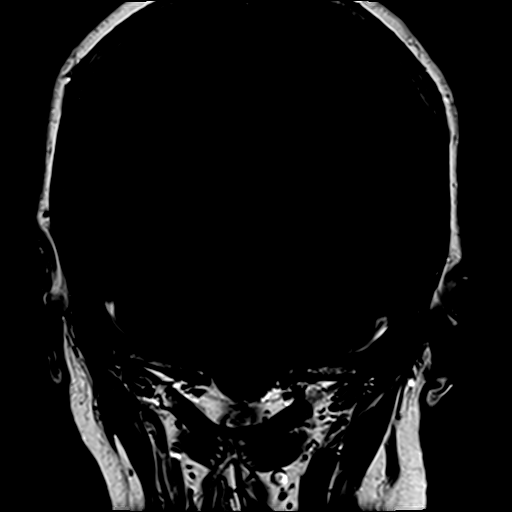

[Series 10: T1 · axial · 3.0mm · 0.35mm/px · 1 of 11 slices shown (3 of 4)]
[im 1/11]
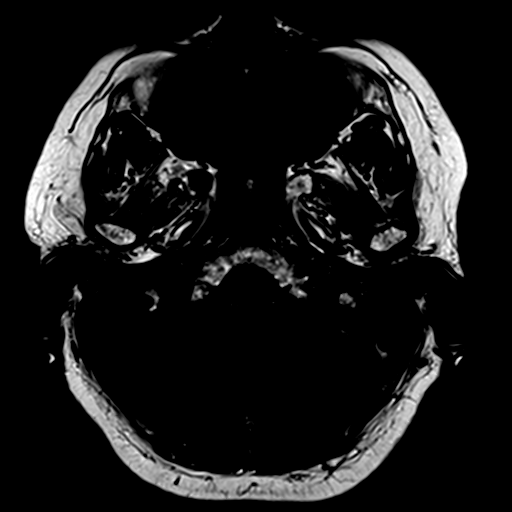

[Series 11: bSSFP · axial · 0.7mm · 0.28mm/px · z∈[+6,+26]mm · 4 of 44 slices shown]
[im 1/44]
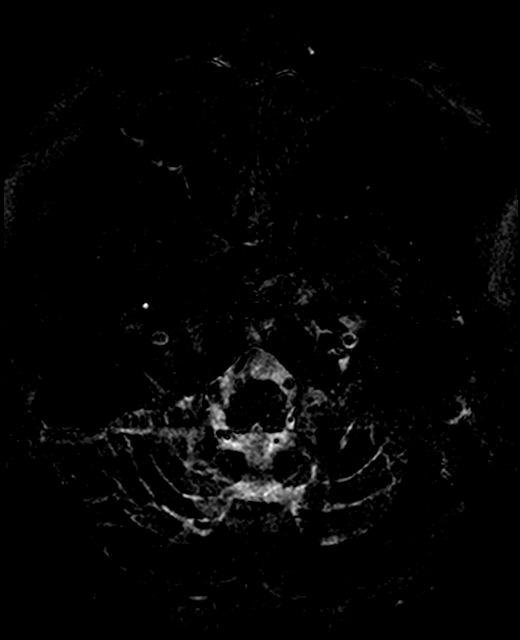
[im 11/44]
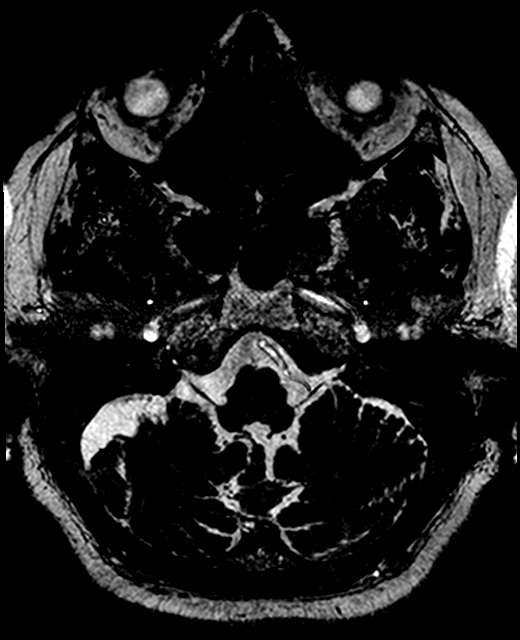
[im 22/44]
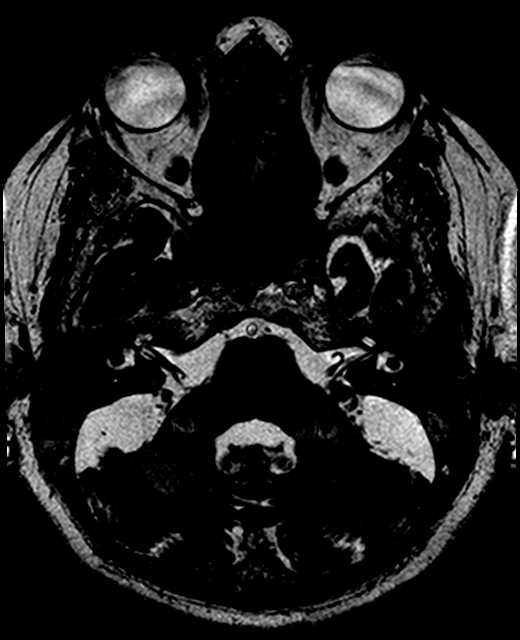
[im 33/44]
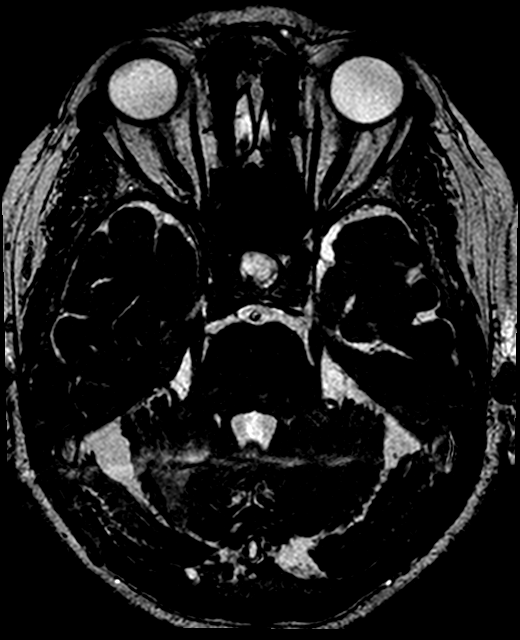

[Series 12: T1 · coronal · 3.0mm · 0.35mm/px · 1 of 11 slices shown (4 of 4)]
[im 1/11]
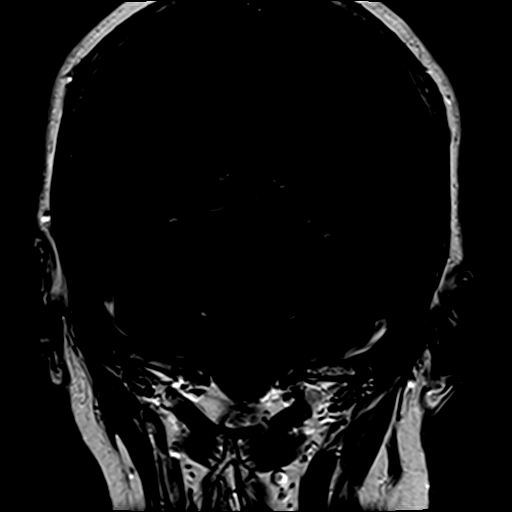

[Series 13: T1 post-contrast · axial · 3.0mm · 0.35mm/px · 1 of 11 slices shown]
[im 1/11]
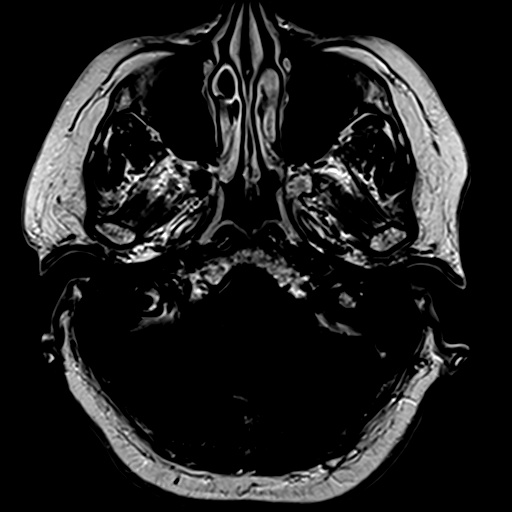

[37 of 48 positions shown; findings below may reference images not displayed]

FINDINGS: Brain: No restricted diffusion is demonstrated to suggest acute or
recent subacute infarction. No evidence of intracranial mass.
Specifically, no cerebellopontine angle mass or internal auditory
canal lesion is demonstrated. No abnormal intracranial enhancement.
No midline shift or extra-axial fluid collection. No chronic
intracranial hemorrhage. Ill-defined T2 hyperintensity within the
pons is nonspecific, but consistent with chronic small vessel
ischemic disease. Mild generalized parenchymal atrophy. Partially
empty sella turcica.

Vascular: Flow voids maintained within the proximal large arterial
vessels.

Skull and upper cervical spine: Normal marrow signal.

Sinuses/Orbits: Imaged globes and orbits demonstrate no acute
abnormality. Trace ethmoid sinus mucosal thickening. Left sphenoid
sinus mucous retention cyst. Trace fluid within right mastoid air
cells inferiorly.
IMPRESSION: No evidence of acute intracranial abnormality.

No cerebellopontine angle mass or internal auditory canal lesion.
Trace fluid within right mastoid air cells.

Ill-defined signal changes within the pons are nonspecific, but
consistent with chronic small vessel ischemic disease.

Mild generalized parenchymal atrophy.

## 2020-12-10 DIAGNOSIS — Z85828 Personal history of other malignant neoplasm of skin: Secondary | ICD-10-CM | POA: Diagnosis not present

## 2020-12-10 DIAGNOSIS — L814 Other melanin hyperpigmentation: Secondary | ICD-10-CM | POA: Diagnosis not present

## 2020-12-10 DIAGNOSIS — L718 Other rosacea: Secondary | ICD-10-CM | POA: Diagnosis not present

## 2020-12-10 DIAGNOSIS — B078 Other viral warts: Secondary | ICD-10-CM | POA: Diagnosis not present

## 2020-12-10 DIAGNOSIS — D1801 Hemangioma of skin and subcutaneous tissue: Secondary | ICD-10-CM | POA: Diagnosis not present

## 2020-12-10 DIAGNOSIS — L308 Other specified dermatitis: Secondary | ICD-10-CM | POA: Diagnosis not present

## 2020-12-10 DIAGNOSIS — L570 Actinic keratosis: Secondary | ICD-10-CM | POA: Diagnosis not present

## 2020-12-10 DIAGNOSIS — L820 Inflamed seborrheic keratosis: Secondary | ICD-10-CM | POA: Diagnosis not present

## 2020-12-10 DIAGNOSIS — L821 Other seborrheic keratosis: Secondary | ICD-10-CM | POA: Diagnosis not present

## 2020-12-29 DIAGNOSIS — K1379 Other lesions of oral mucosa: Secondary | ICD-10-CM | POA: Diagnosis not present

## 2021-01-15 ENCOUNTER — Other Ambulatory Visit: Payer: Self-pay | Admitting: Oral Surgery

## 2021-01-15 DIAGNOSIS — K140 Glossitis: Secondary | ICD-10-CM | POA: Diagnosis not present

## 2021-01-15 DIAGNOSIS — K1379 Other lesions of oral mucosa: Secondary | ICD-10-CM | POA: Diagnosis not present

## 2021-02-03 DIAGNOSIS — E1140 Type 2 diabetes mellitus with diabetic neuropathy, unspecified: Secondary | ICD-10-CM | POA: Diagnosis not present

## 2021-02-03 DIAGNOSIS — Z794 Long term (current) use of insulin: Secondary | ICD-10-CM | POA: Diagnosis not present

## 2021-02-03 DIAGNOSIS — I2510 Atherosclerotic heart disease of native coronary artery without angina pectoris: Secondary | ICD-10-CM | POA: Diagnosis not present

## 2021-02-03 DIAGNOSIS — I10 Essential (primary) hypertension: Secondary | ICD-10-CM | POA: Diagnosis not present

## 2021-02-03 DIAGNOSIS — E1149 Type 2 diabetes mellitus with other diabetic neurological complication: Secondary | ICD-10-CM | POA: Diagnosis not present

## 2021-02-10 DIAGNOSIS — E785 Hyperlipidemia, unspecified: Secondary | ICD-10-CM | POA: Diagnosis not present

## 2021-02-10 DIAGNOSIS — Z0000 Encounter for general adult medical examination without abnormal findings: Secondary | ICD-10-CM | POA: Diagnosis not present

## 2021-02-10 DIAGNOSIS — E1165 Type 2 diabetes mellitus with hyperglycemia: Secondary | ICD-10-CM | POA: Diagnosis not present

## 2021-02-13 DIAGNOSIS — I2510 Atherosclerotic heart disease of native coronary artery without angina pectoris: Secondary | ICD-10-CM | POA: Diagnosis not present

## 2021-02-13 DIAGNOSIS — J45909 Unspecified asthma, uncomplicated: Secondary | ICD-10-CM | POA: Diagnosis not present

## 2021-02-13 DIAGNOSIS — J302 Other seasonal allergic rhinitis: Secondary | ICD-10-CM | POA: Diagnosis not present

## 2021-02-13 DIAGNOSIS — R82998 Other abnormal findings in urine: Secondary | ICD-10-CM | POA: Diagnosis not present

## 2021-02-13 DIAGNOSIS — I10 Essential (primary) hypertension: Secondary | ICD-10-CM | POA: Diagnosis not present

## 2021-02-13 DIAGNOSIS — E1149 Type 2 diabetes mellitus with other diabetic neurological complication: Secondary | ICD-10-CM | POA: Diagnosis not present

## 2021-02-13 DIAGNOSIS — F39 Unspecified mood [affective] disorder: Secondary | ICD-10-CM | POA: Diagnosis not present

## 2021-02-13 DIAGNOSIS — R413 Other amnesia: Secondary | ICD-10-CM | POA: Diagnosis not present

## 2021-02-13 DIAGNOSIS — E785 Hyperlipidemia, unspecified: Secondary | ICD-10-CM | POA: Diagnosis not present

## 2021-02-13 DIAGNOSIS — Z0000 Encounter for general adult medical examination without abnormal findings: Secondary | ICD-10-CM | POA: Diagnosis not present

## 2021-02-13 DIAGNOSIS — G629 Polyneuropathy, unspecified: Secondary | ICD-10-CM | POA: Diagnosis not present

## 2021-02-13 DIAGNOSIS — K148 Other diseases of tongue: Secondary | ICD-10-CM | POA: Diagnosis not present

## 2021-02-13 DIAGNOSIS — N3281 Overactive bladder: Secondary | ICD-10-CM | POA: Diagnosis not present

## 2021-02-24 ENCOUNTER — Other Ambulatory Visit: Payer: Self-pay | Admitting: Oral Surgery

## 2021-02-24 DIAGNOSIS — K140 Glossitis: Secondary | ICD-10-CM | POA: Diagnosis not present

## 2021-02-24 DIAGNOSIS — K1329 Other disturbances of oral epithelium, including tongue: Secondary | ICD-10-CM | POA: Diagnosis not present

## 2021-03-10 ENCOUNTER — Other Ambulatory Visit: Payer: Self-pay | Admitting: Cardiology

## 2021-03-31 ENCOUNTER — Other Ambulatory Visit (HOSPITAL_BASED_OUTPATIENT_CLINIC_OR_DEPARTMENT_OTHER): Payer: Self-pay

## 2021-03-31 ENCOUNTER — Other Ambulatory Visit: Payer: Self-pay

## 2021-03-31 ENCOUNTER — Ambulatory Visit: Payer: Medicare Other | Attending: Internal Medicine

## 2021-03-31 DIAGNOSIS — Z23 Encounter for immunization: Secondary | ICD-10-CM

## 2021-03-31 MED ORDER — COVID-19 MRNA VACC (MODERNA) 100 MCG/0.5ML IM SUSP
INTRAMUSCULAR | 0 refills | Status: DC
  Filled 2021-03-31: qty 0.25, 1d supply, fill #0

## 2021-03-31 NOTE — Progress Notes (Signed)
   Covid-19 Vaccination Clinic  Name:  Karina Foster    MRN: [id number] DOB: [birth year]  03/31/2021  [name] was observed post Covid-19 immunization for 15 minutes without incident. She was provided with Vaccine Information Sheet and instruction to access the V-Safe system.   [name] was instructed to call 911 with any severe reactions post vaccine: Difficulty breathing  Swelling of face and throat  A fast heartbeat  A bad rash all over body  Dizziness and weakness   Immunizations Administered     Name Date Dose VIS Date Route   Moderna Covid-19 Booster Vaccine 03/31/2021 11:54 AM 0.25 mL 06/25/2020 Intramuscular   Manufacturer: Moderna   Lot: [id number]   [id number]

## 2021-04-15 DIAGNOSIS — Z0489 Encounter for examination and observation for other specified reasons: Secondary | ICD-10-CM | POA: Diagnosis not present

## 2021-05-05 DIAGNOSIS — E1149 Type 2 diabetes mellitus with other diabetic neurological complication: Secondary | ICD-10-CM | POA: Diagnosis not present

## 2021-05-05 DIAGNOSIS — I2510 Atherosclerotic heart disease of native coronary artery without angina pectoris: Secondary | ICD-10-CM | POA: Diagnosis not present

## 2021-05-05 DIAGNOSIS — I10 Essential (primary) hypertension: Secondary | ICD-10-CM | POA: Diagnosis not present

## 2021-05-05 DIAGNOSIS — Z794 Long term (current) use of insulin: Secondary | ICD-10-CM | POA: Diagnosis not present

## 2021-05-26 DIAGNOSIS — H353132 Nonexudative age-related macular degeneration, bilateral, intermediate dry stage: Secondary | ICD-10-CM | POA: Diagnosis not present

## 2021-05-26 DIAGNOSIS — H52223 Regular astigmatism, bilateral: Secondary | ICD-10-CM | POA: Diagnosis not present

## 2021-05-26 DIAGNOSIS — H43823 Vitreomacular adhesion, bilateral: Secondary | ICD-10-CM | POA: Diagnosis not present

## 2021-05-26 DIAGNOSIS — H26491 Other secondary cataract, right eye: Secondary | ICD-10-CM | POA: Diagnosis not present

## 2021-05-26 DIAGNOSIS — E119 Type 2 diabetes mellitus without complications: Secondary | ICD-10-CM | POA: Diagnosis not present

## 2021-05-26 DIAGNOSIS — H04123 Dry eye syndrome of bilateral lacrimal glands: Secondary | ICD-10-CM | POA: Diagnosis not present

## 2021-07-07 ENCOUNTER — Other Ambulatory Visit: Payer: Self-pay | Admitting: Internal Medicine

## 2021-07-07 DIAGNOSIS — R413 Other amnesia: Secondary | ICD-10-CM | POA: Diagnosis not present

## 2021-07-07 DIAGNOSIS — F39 Unspecified mood [affective] disorder: Secondary | ICD-10-CM | POA: Diagnosis not present

## 2021-07-07 DIAGNOSIS — I2510 Atherosclerotic heart disease of native coronary artery without angina pectoris: Secondary | ICD-10-CM | POA: Diagnosis not present

## 2021-07-07 DIAGNOSIS — N6311 Unspecified lump in the right breast, upper outer quadrant: Secondary | ICD-10-CM

## 2021-07-07 DIAGNOSIS — N6310 Unspecified lump in the right breast, unspecified quadrant: Secondary | ICD-10-CM | POA: Diagnosis not present

## 2021-07-07 DIAGNOSIS — E785 Hyperlipidemia, unspecified: Secondary | ICD-10-CM | POA: Diagnosis not present

## 2021-07-07 DIAGNOSIS — K148 Other diseases of tongue: Secondary | ICD-10-CM | POA: Diagnosis not present

## 2021-07-07 DIAGNOSIS — G629 Polyneuropathy, unspecified: Secondary | ICD-10-CM | POA: Diagnosis not present

## 2021-07-07 DIAGNOSIS — E1149 Type 2 diabetes mellitus with other diabetic neurological complication: Secondary | ICD-10-CM | POA: Diagnosis not present

## 2021-07-07 DIAGNOSIS — N3281 Overactive bladder: Secondary | ICD-10-CM | POA: Diagnosis not present

## 2021-07-07 DIAGNOSIS — I10 Essential (primary) hypertension: Secondary | ICD-10-CM | POA: Diagnosis not present

## 2021-07-07 DIAGNOSIS — G4733 Obstructive sleep apnea (adult) (pediatric): Secondary | ICD-10-CM | POA: Diagnosis not present

## 2021-07-22 ENCOUNTER — Other Ambulatory Visit: Payer: Medicare Other

## 2021-07-23 DIAGNOSIS — Z794 Long term (current) use of insulin: Secondary | ICD-10-CM | POA: Diagnosis not present

## 2021-07-23 DIAGNOSIS — E1149 Type 2 diabetes mellitus with other diabetic neurological complication: Secondary | ICD-10-CM | POA: Diagnosis not present

## 2021-07-23 DIAGNOSIS — I10 Essential (primary) hypertension: Secondary | ICD-10-CM | POA: Diagnosis not present

## 2021-07-23 DIAGNOSIS — I2510 Atherosclerotic heart disease of native coronary artery without angina pectoris: Secondary | ICD-10-CM | POA: Diagnosis not present

## 2021-08-04 ENCOUNTER — Other Ambulatory Visit: Payer: Self-pay | Admitting: Oral Surgery

## 2021-08-04 DIAGNOSIS — K1239 Other oral mucositis (ulcerative): Secondary | ICD-10-CM | POA: Diagnosis not present

## 2021-08-04 DIAGNOSIS — K140 Glossitis: Secondary | ICD-10-CM | POA: Diagnosis not present

## 2021-10-23 ENCOUNTER — Ambulatory Visit: Payer: Medicare Other | Admitting: Cardiology

## 2021-11-24 DIAGNOSIS — E119 Type 2 diabetes mellitus without complications: Secondary | ICD-10-CM | POA: Diagnosis not present

## 2021-11-24 DIAGNOSIS — H26491 Other secondary cataract, right eye: Secondary | ICD-10-CM | POA: Diagnosis not present

## 2021-11-24 DIAGNOSIS — H353132 Nonexudative age-related macular degeneration, bilateral, intermediate dry stage: Secondary | ICD-10-CM | POA: Diagnosis not present

## 2021-11-24 DIAGNOSIS — H11152 Pinguecula, left eye: Secondary | ICD-10-CM | POA: Diagnosis not present

## 2021-12-10 DIAGNOSIS — L814 Other melanin hyperpigmentation: Secondary | ICD-10-CM | POA: Diagnosis not present

## 2021-12-10 DIAGNOSIS — I872 Venous insufficiency (chronic) (peripheral): Secondary | ICD-10-CM | POA: Diagnosis not present

## 2021-12-10 DIAGNOSIS — L570 Actinic keratosis: Secondary | ICD-10-CM | POA: Diagnosis not present

## 2021-12-10 DIAGNOSIS — L820 Inflamed seborrheic keratosis: Secondary | ICD-10-CM | POA: Diagnosis not present

## 2021-12-10 DIAGNOSIS — D1801 Hemangioma of skin and subcutaneous tissue: Secondary | ICD-10-CM | POA: Diagnosis not present

## 2021-12-10 DIAGNOSIS — L821 Other seborrheic keratosis: Secondary | ICD-10-CM | POA: Diagnosis not present

## 2021-12-10 DIAGNOSIS — I8311 Varicose veins of right lower extremity with inflammation: Secondary | ICD-10-CM | POA: Diagnosis not present

## 2021-12-10 DIAGNOSIS — I8312 Varicose veins of left lower extremity with inflammation: Secondary | ICD-10-CM | POA: Diagnosis not present

## 2021-12-10 DIAGNOSIS — Z85828 Personal history of other malignant neoplasm of skin: Secondary | ICD-10-CM | POA: Diagnosis not present

## 2022-02-22 DIAGNOSIS — E785 Hyperlipidemia, unspecified: Secondary | ICD-10-CM | POA: Diagnosis not present

## 2022-02-22 DIAGNOSIS — I10 Essential (primary) hypertension: Secondary | ICD-10-CM | POA: Diagnosis not present

## 2022-02-22 DIAGNOSIS — R7989 Other specified abnormal findings of blood chemistry: Secondary | ICD-10-CM | POA: Diagnosis not present

## 2022-02-22 DIAGNOSIS — E1149 Type 2 diabetes mellitus with other diabetic neurological complication: Secondary | ICD-10-CM | POA: Diagnosis not present

## 2022-02-24 DIAGNOSIS — K1329 Other disturbances of oral epithelium, including tongue: Secondary | ICD-10-CM | POA: Diagnosis not present

## 2022-03-01 DIAGNOSIS — G4733 Obstructive sleep apnea (adult) (pediatric): Secondary | ICD-10-CM | POA: Diagnosis not present

## 2022-03-01 DIAGNOSIS — E1149 Type 2 diabetes mellitus with other diabetic neurological complication: Secondary | ICD-10-CM | POA: Diagnosis not present

## 2022-03-01 DIAGNOSIS — N6310 Unspecified lump in the right breast, unspecified quadrant: Secondary | ICD-10-CM | POA: Diagnosis not present

## 2022-03-01 DIAGNOSIS — G629 Polyneuropathy, unspecified: Secondary | ICD-10-CM | POA: Diagnosis not present

## 2022-03-01 DIAGNOSIS — I10 Essential (primary) hypertension: Secondary | ICD-10-CM | POA: Diagnosis not present

## 2022-03-01 DIAGNOSIS — R82998 Other abnormal findings in urine: Secondary | ICD-10-CM | POA: Diagnosis not present

## 2022-03-01 DIAGNOSIS — Z0000 Encounter for general adult medical examination without abnormal findings: Secondary | ICD-10-CM | POA: Diagnosis not present

## 2022-03-01 DIAGNOSIS — H9190 Unspecified hearing loss, unspecified ear: Secondary | ICD-10-CM | POA: Diagnosis not present

## 2022-03-01 DIAGNOSIS — E038 Other specified hypothyroidism: Secondary | ICD-10-CM | POA: Diagnosis not present

## 2022-03-01 DIAGNOSIS — R413 Other amnesia: Secondary | ICD-10-CM | POA: Diagnosis not present

## 2022-03-01 DIAGNOSIS — K148 Other diseases of tongue: Secondary | ICD-10-CM | POA: Diagnosis not present

## 2022-03-01 DIAGNOSIS — I2510 Atherosclerotic heart disease of native coronary artery without angina pectoris: Secondary | ICD-10-CM | POA: Diagnosis not present

## 2022-03-01 DIAGNOSIS — N3281 Overactive bladder: Secondary | ICD-10-CM | POA: Diagnosis not present

## 2022-03-04 DIAGNOSIS — Z794 Long term (current) use of insulin: Secondary | ICD-10-CM | POA: Diagnosis not present

## 2022-03-04 DIAGNOSIS — E1149 Type 2 diabetes mellitus with other diabetic neurological complication: Secondary | ICD-10-CM | POA: Diagnosis not present

## 2022-03-04 DIAGNOSIS — I2510 Atherosclerotic heart disease of native coronary artery without angina pectoris: Secondary | ICD-10-CM | POA: Diagnosis not present

## 2022-03-04 DIAGNOSIS — I10 Essential (primary) hypertension: Secondary | ICD-10-CM | POA: Diagnosis not present

## 2022-03-29 ENCOUNTER — Other Ambulatory Visit: Payer: Self-pay | Admitting: Cardiology

## 2022-04-15 DIAGNOSIS — N390 Urinary tract infection, site not specified: Secondary | ICD-10-CM | POA: Diagnosis not present

## 2022-04-23 DIAGNOSIS — N3020 Other chronic cystitis without hematuria: Secondary | ICD-10-CM | POA: Diagnosis not present

## 2022-04-23 DIAGNOSIS — N3941 Urge incontinence: Secondary | ICD-10-CM | POA: Diagnosis not present

## 2022-05-07 DIAGNOSIS — H04123 Dry eye syndrome of bilateral lacrimal glands: Secondary | ICD-10-CM | POA: Diagnosis not present

## 2022-05-07 DIAGNOSIS — H00025 Hordeolum internum left lower eyelid: Secondary | ICD-10-CM | POA: Diagnosis not present

## 2022-05-07 DIAGNOSIS — Z961 Presence of intraocular lens: Secondary | ICD-10-CM | POA: Diagnosis not present

## 2022-05-11 DIAGNOSIS — H00025 Hordeolum internum left lower eyelid: Secondary | ICD-10-CM | POA: Diagnosis not present

## 2022-05-11 DIAGNOSIS — H26491 Other secondary cataract, right eye: Secondary | ICD-10-CM | POA: Diagnosis not present

## 2022-05-11 DIAGNOSIS — Z961 Presence of intraocular lens: Secondary | ICD-10-CM | POA: Diagnosis not present

## 2022-05-11 DIAGNOSIS — E119 Type 2 diabetes mellitus without complications: Secondary | ICD-10-CM | POA: Diagnosis not present

## 2022-05-21 DIAGNOSIS — N3941 Urge incontinence: Secondary | ICD-10-CM | POA: Diagnosis not present

## 2022-05-25 DIAGNOSIS — H35033 Hypertensive retinopathy, bilateral: Secondary | ICD-10-CM | POA: Diagnosis not present

## 2022-05-25 DIAGNOSIS — H43823 Vitreomacular adhesion, bilateral: Secondary | ICD-10-CM | POA: Diagnosis not present

## 2022-05-25 DIAGNOSIS — H353132 Nonexudative age-related macular degeneration, bilateral, intermediate dry stage: Secondary | ICD-10-CM | POA: Diagnosis not present

## 2022-05-28 DIAGNOSIS — R3915 Urgency of urination: Secondary | ICD-10-CM | POA: Diagnosis not present

## 2022-05-28 DIAGNOSIS — N3020 Other chronic cystitis without hematuria: Secondary | ICD-10-CM | POA: Diagnosis not present

## 2022-05-28 DIAGNOSIS — R350 Frequency of micturition: Secondary | ICD-10-CM | POA: Diagnosis not present

## 2022-05-28 DIAGNOSIS — N3941 Urge incontinence: Secondary | ICD-10-CM | POA: Diagnosis not present

## 2022-05-28 DIAGNOSIS — N3000 Acute cystitis without hematuria: Secondary | ICD-10-CM | POA: Diagnosis not present

## 2022-05-28 DIAGNOSIS — R8271 Bacteriuria: Secondary | ICD-10-CM | POA: Diagnosis not present

## 2022-06-04 ENCOUNTER — Other Ambulatory Visit: Payer: Self-pay | Admitting: Urology

## 2022-06-08 DIAGNOSIS — I10 Essential (primary) hypertension: Secondary | ICD-10-CM | POA: Diagnosis not present

## 2022-06-08 DIAGNOSIS — Z794 Long term (current) use of insulin: Secondary | ICD-10-CM | POA: Diagnosis not present

## 2022-06-08 DIAGNOSIS — E1149 Type 2 diabetes mellitus with other diabetic neurological complication: Secondary | ICD-10-CM | POA: Diagnosis not present

## 2022-06-08 DIAGNOSIS — I2510 Atherosclerotic heart disease of native coronary artery without angina pectoris: Secondary | ICD-10-CM | POA: Diagnosis not present

## 2022-06-17 ENCOUNTER — Encounter (HOSPITAL_BASED_OUTPATIENT_CLINIC_OR_DEPARTMENT_OTHER): Payer: Self-pay | Admitting: Urology

## 2022-06-17 NOTE — Progress Notes (Addendum)
Addendum:  chart reviewed w/ anesthesia, Dr Jenita Seashore MDA. Dr Glennon Mac stated ok to proceed since pt is stable with state cardiac issues even though she is not up to date with cardiology visit due to the simple procedure pt is having.  Spoke w/ via phone for pre-op interview--- pt Lab needs dos---- [facility], ekg              Lab results------ no COVID test -----patient states asymptomatic no test needed Arrive at ------- 1145 on 06-22-2022 NPO after MN NO Solid Food.  Clear liquids from MN until--- 1045 Med rec completed Medications to take morning of surgery ----- lopressor, celexa, zantac, nexlizet Diabetic medication ----- do not do humalog morning of surgery,  do half dose of tresiba day before surgery. Patient instructed no nail polish to be worn day of surgery Patient instructed to bring photo id and insurance card day of surgery Patient aware to have Driver (ride ) / caregiver   for 24 hours after surgery -- husband, robert Patient Special Instructions ----- n/a Pre-Op special Istructions ----- n/a Patient verbalized understanding of instructions that were given at this phone interview. Patient denies shortness of breath, chest pain, fever, cough at this phone interview.    Anesthesia Review:  HTN;  CAD hx NSTEMI s/p PCI w/ DES x2 to prox and mid LAD 04/ 2010;  IDDM2;  OSA ( has not used dental appliance in few yrs) Pt denies cardiac s&s, no peripheral swelling, but sob when she over exerts herself.  Stated has taken nitro since having prescription 2010.  PCP: Dr Dagmar Hait Cardiologist : Dr Einar Gip (lov 10-23-2020 epic) EKG :  10-23-2020 Echo : 06-16-2017 Stress test: nuclear 05-23-2017 Cardiac Cath : 12-16-2008 epic Sleep Study/ CPAP :  Yes/ no Fasting Blood Sugar :   110s   / Checks Blood Sugar -- times a day:  several times w/ Elenor Legato Blood Thinner/ Instructions Maryjane Hurter Dose:  no ASA / Instructions/ Last Dose :  ASA 325 mg/  pt stated was told by dr pace to continue but do not take  morning of surgery

## 2022-06-17 NOTE — H&P (Signed)
CC/HPI: cc: Urinary frequency/urgency   04/23/2022: 81 year old woman referred for urinary frequency, urgency, and urge incontinence who has failed multiple oral therapies. She has tried both Risk manager without success. She has been on 50 mg of Myrbetriq for years. She wears an incontinence diaper with a pad in it during the day and changes multiple times a day. Her incontinence is all associated with urgency. She has no stress urinary incontinence. She drinks 1 to 2 cups of coffee a day but no carbonation or spicy food. She is try to cut this out before not had any improvement in symptoms. She also gets urinary tract infections and was treated for a UTI with Macrobid last week. Her husband is a patient of Dr. Milford Cage. She is history of breast cancer and underwent surgery, chemotherapy and radiation in 1997. She is not on any antiestrogen medication now. No stress urinary incontinence.   Overactive bladder symptom quids: 5, 5, 5, 4, 5, 5, 5, 4=38/40   05/28/22: 81 year old woman with a history of urinary frequency and urgency comes in today for urodynamics results. She also feels like she is starting with a UTI. She been experiencing dysuria and pressure for the last few days. Urinalysis does appear consistent with infection.    UDS SUMMARY  Ms. Averhart held a max capacity of approx. 172 mls. Her 1st sensation was felt at 109 mls. No SUI noted. There was positive instability. She voided involuntary with urgency off an unstable contraction. He was able to generate a voluntary contraction and void 41 mls with max flow of 5 ml/s. EMG leads were basically quiet during the voiding phase. PVR was approx. 175 mls. No trabeculation was noted. No reflux was seen.     ALLERGIES: Emycine    MEDICATIONS: Aspirin 325 mg tablet  Metoprolol Succinate 25 mg tablet, extended release 24 hr  Myrbetriq 50 mg tablet, extended release 24 hr  Omeprazole 20 mg capsule,delayed release  Citalopram Hbr 40 mg tablet   Estradiol 0.01 % cream with applicator 1 gram Transdermal Q HS Place 1 g of cream in the vagina nightly for 14 days then twice a week thereafter  Ezetimibe  Humalog  Meclizine Hcl 25 mg tablet  Ramipril 2.5 mg capsule  Tresiba  Triamcinolone Acetonide 0.1 % paste  Vitamin D3     GU PSH: Complex cystometrogram, w/ void pressure and urethral pressure profile studies, any technique - 05/21/2022 Complex Uroflow - 05/21/2022 Emg surf Electrd - 05/21/2022 Inject For cystogram - 05/21/2022 Intrabd voidng Press - 05/21/2022     NON-GU PSH: Bilateral Tubal Ligation Breast Biopsy PARTIAL MASTECTOMY     GU PMH: Urge incontinence - 05/21/2022, - 04/23/2022 Chronic cystitis (w/o hematuria) - 04/23/2022 Urinary Frequency - 04/23/2022 Urinary Urgency - 04/23/2022      PMH Notes: Diabetes.  Neuropathy.   NON-GU PMH: Anxiety Arthritis Asthma Breast Cancer, History Depression GERD Heart disease, unspecified Hypercholesterolemia Hypertension Sleep Apnea    FAMILY HISTORY: 1 son - Runs in Family Heart Disease - Father   SOCIAL HISTORY: Marital Status: Married Preferred Language: English; Ethnicity: Not Hispanic Or Latino; Race: White Current Smoking Status: Patient does not smoke anymore.   Tobacco Use Assessment Completed: Used Tobacco in last 30 days? Drinks 2 drinks per day.  Drinks 2 caffeinated drinks per day.    REVIEW OF SYSTEMS:    GU Review Female:   Patient denies frequent urination, hard to postpone urination, burning /pain with urination, get up at night to urinate, leakage of  urine, stream starts and stops, trouble starting your stream, have to strain to urinate, and being pregnant.  Gastrointestinal (Upper):   Patient denies nausea, vomiting, and indigestion/ heartburn.  Gastrointestinal (Lower):   Patient denies diarrhea and constipation.  Constitutional:   Patient denies fever, night sweats, weight loss, and fatigue.  Skin:   Patient denies skin rash/ lesion and  itching.  Eyes:   Patient denies blurred vision and double vision.  Ears/ Nose/ Throat:   Patient denies sore throat and sinus problems.  Hematologic/Lymphatic:   Patient denies swollen glands and easy bruising.  Cardiovascular:   Patient denies leg swelling and chest pains.  Respiratory:   Patient denies cough and shortness of breath.  Endocrine:   Patient denies excessive thirst.  Musculoskeletal:   Patient denies back pain and joint pain.  Neurological:   Patient denies headaches and dizziness.  Psychologic:   Patient denies depression and anxiety.   VITAL SIGNS: None   MULTI-SYSTEM PHYSICAL EXAMINATION:    Constitutional: Well-nourished. No physical deformities. Normally developed. Good grooming.  Neck: Neck symmetrical, not swollen. Normal tracheal position.  Respiratory: No labored breathing, no use of accessory muscles.   Skin: No paleness, no jaundice, no cyanosis. No lesion, no ulcer, no rash.  Neurologic / Psychiatric: Oriented to time, oriented to place, oriented to person. No depression, no anxiety, no agitation.  Eyes: Normal conjunctivae. Normal eyelids.  Ears, Nose, Mouth, and Throat: Left ear no scars, no lesions, no masses. Right ear no scars, no lesions, no masses. Nose no scars, no lesions, no masses. Normal hearing. Normal lips.  Musculoskeletal: Normal gait and station of head and neck.     Complexity of Data:  Records Review:   Previous Patient Records, POC Tool  Urine Test Review:   Urinalysis  Urodynamics Review:   Review Urodynamics Tests  Notes:                     02/22/2022: BUN 16, GFR 60.6   PROCEDURES:          Urinalysis w/Scope Dipstick Dipstick Cont'd Micro  Color: Yellow Bilirubin: Neg mg/dL WBC/hpf: 6 - 10/hpf  Appearance: Cloudy Ketones: Neg mg/dL RBC/hpf: 3 - 10/hpf  Specific Gravity: 1.025 Blood: 3+ ery/uL Bacteria: Mod (26-50/hpf)  pH: 6.0 Protein: 1+ mg/dL Cystals: NS (Not Seen)  Glucose: Neg mg/dL Urobilinogen: 0.2 mg/dL Casts: NS (Not  Seen)    Nitrites: Neg Trichomonas: Not Present    Leukocyte Esterase: Trace leu/uL Mucous: Present      Epithelial Cells: 0 - 5/hpf      Yeast: NS (Not Seen)      Sperm: Not Present    Notes: QNS for spun micro    ASSESSMENT:      ICD-10 Details  1 GU:   Chronic cystitis (w/o hematuria) - N30.20 Chronic, Stable  2   Acute Cystitis/UTI - X38.18 Acute, Uncomplicated  3   Urge incontinence - N39.41 Chronic, Stable  4   Urinary Frequency - R35.0 Chronic, Stable  5   Urinary Urgency - R39.15 Chronic, Stable   PLAN:            Medications New Meds: Macrobid 100 mg capsule 1 capsule PO BID   #10  0 Refill(s)  Pharmacy Name:  [facility]  Address:  [address] [place], [place] [id number]  Phone:  [phone]  Fax:  [phone]  Orders Labs CULTURE, URINE          Document Letter(s):  Created for Patient: Clinical Summary         Notes:   1. Acute cystitis:  -Send urine for culture and treat empirically with Macrobid   2. Urinary urgency/frequency/urge incontinence:  -Discussed with patient results of urodynamic study which did show positive instability as well as urge incontinence  -We discussed moving forward with cystoscopy with intravesical Botox injections  -Risk and benefits of the procedure were discussed with the patient in detail including but not limited to urinary retention and need to self catheterize or have a indwelling Foley catheter for a few months, pain, dysuria, hematuria, failure of of procedure  -We will proceed with cystoscopy with Botox under sedation with 75 units

## 2022-06-22 ENCOUNTER — Encounter (HOSPITAL_BASED_OUTPATIENT_CLINIC_OR_DEPARTMENT_OTHER)
Admission: RE | Disposition: A | Discharge status: Home / Self Care | Payer: Self-pay | Source: Home / Self Care | Attending: Urology

## 2022-06-22 ENCOUNTER — Encounter (HOSPITAL_BASED_OUTPATIENT_CLINIC_OR_DEPARTMENT_OTHER): Payer: Self-pay | Admitting: Urology

## 2022-06-22 ENCOUNTER — Ambulatory Visit (HOSPITAL_BASED_OUTPATIENT_CLINIC_OR_DEPARTMENT_OTHER): Payer: Medicare Other | Admitting: Anesthesiology

## 2022-06-22 ENCOUNTER — Ambulatory Visit (HOSPITAL_BASED_OUTPATIENT_CLINIC_OR_DEPARTMENT_OTHER)
Admission: RE | Admit: 2022-06-22 | Discharge: 2022-06-22 | Disposition: A | Discharge status: Home / Self Care | Payer: Medicare Other | Attending: Urology | Admitting: Urology

## 2022-06-22 DIAGNOSIS — N3090 Cystitis, unspecified without hematuria: Secondary | ICD-10-CM | POA: Diagnosis not present

## 2022-06-22 DIAGNOSIS — I2510 Atherosclerotic heart disease of native coronary artery without angina pectoris: Secondary | ICD-10-CM | POA: Diagnosis not present

## 2022-06-22 DIAGNOSIS — Z87891 Personal history of nicotine dependence: Secondary | ICD-10-CM | POA: Diagnosis not present

## 2022-06-22 DIAGNOSIS — E119 Type 2 diabetes mellitus without complications: Secondary | ICD-10-CM | POA: Diagnosis not present

## 2022-06-22 DIAGNOSIS — N3281 Overactive bladder: Secondary | ICD-10-CM | POA: Diagnosis not present

## 2022-06-22 DIAGNOSIS — N3941 Urge incontinence: Secondary | ICD-10-CM | POA: Diagnosis not present

## 2022-06-22 DIAGNOSIS — N3020 Other chronic cystitis without hematuria: Secondary | ICD-10-CM | POA: Diagnosis not present

## 2022-06-22 DIAGNOSIS — K219 Gastro-esophageal reflux disease without esophagitis: Secondary | ICD-10-CM | POA: Diagnosis not present

## 2022-06-22 DIAGNOSIS — N3289 Other specified disorders of bladder: Secondary | ICD-10-CM | POA: Diagnosis not present

## 2022-06-22 DIAGNOSIS — F32A Depression, unspecified: Secondary | ICD-10-CM | POA: Diagnosis not present

## 2022-06-22 DIAGNOSIS — Z955 Presence of coronary angioplasty implant and graft: Secondary | ICD-10-CM | POA: Insufficient documentation

## 2022-06-22 DIAGNOSIS — R3129 Other microscopic hematuria: Secondary | ICD-10-CM | POA: Diagnosis not present

## 2022-06-22 DIAGNOSIS — I10 Essential (primary) hypertension: Secondary | ICD-10-CM | POA: Diagnosis not present

## 2022-06-22 DIAGNOSIS — Z01818 Encounter for other preprocedural examination: Secondary | ICD-10-CM

## 2022-06-22 DIAGNOSIS — Z794 Long term (current) use of insulin: Secondary | ICD-10-CM

## 2022-06-22 HISTORY — DX: Personal history of diseases of the blood and blood-forming organs and certain disorders involving the immune mechanism: Z86.2

## 2022-06-22 HISTORY — DX: Essential (primary) hypertension: I10

## 2022-06-22 HISTORY — DX: Mixed incontinence: N39.46

## 2022-06-22 HISTORY — DX: Overactive bladder: N32.81

## 2022-06-22 HISTORY — DX: Adverse effect of antineoplastic and immunosuppressive drugs, initial encounter: T45.1X5A

## 2022-06-22 HISTORY — DX: Other seasonal allergic rhinitis: J30.2

## 2022-06-22 HISTORY — DX: Unspecified osteoarthritis, unspecified site: M19.90

## 2022-06-22 HISTORY — DX: Type 2 diabetes mellitus without complications: Z79.4

## 2022-06-22 HISTORY — DX: Personal history of other benign neoplasm: Z86.018

## 2022-06-22 HISTORY — DX: Presence of spectacles and contact lenses: Z97.3

## 2022-06-22 HISTORY — PX: CYSTOSCOPY: SHX5120

## 2022-06-22 HISTORY — DX: Unspecified macular degeneration: H35.30

## 2022-06-22 HISTORY — DX: Presence of external hearing-aid: Z97.4

## 2022-06-22 HISTORY — DX: Polyneuropathy, unspecified: G62.9

## 2022-06-22 HISTORY — PX: BOTOX INJECTION: SHX5754

## 2022-06-22 HISTORY — DX: Unspecified mood (affective) disorder: F39

## 2022-06-22 HISTORY — DX: Drug-induced polyneuropathy: G62.0

## 2022-06-22 HISTORY — DX: Type 2 diabetes mellitus without complications: E11.9

## 2022-06-22 LAB — GLUCOSE, CAPILLARY
Glucose-Capillary: 57 mg/dL — ABNORMAL LOW (ref 70–99)
Glucose-Capillary: 102 mg/dL — ABNORMAL HIGH (ref 70–99)

## 2022-06-22 LAB — POCT I-STAT, CHEM 8
BUN: 20 mg/dL (ref 8–23)
Calcium, Ion: 1.27 mmol/L (ref 1.15–1.40)
Chloride: 102 mmol/L (ref 98–111)
Creatinine, Ser: 0.9 mg/dL (ref 0.44–1.00)
Glucose, Bld: 48 mg/dL — ABNORMAL LOW (ref 70–99)
HCT: 39 % (ref 36.0–46.0)
Hemoglobin: 13.3 g/dL (ref 12.0–15.0)
Potassium: 4.4 mmol/L (ref 3.5–5.1)
Sodium: 139 mmol/L (ref 135–145)
TCO2: 24 mmol/L (ref 22–32)

## 2022-06-22 SURGERY — CYSTOSCOPY
Anesthesia: General | Site: Bladder

## 2022-06-22 MED ORDER — ONDANSETRON HCL 4 MG/2ML IJ SOLN
INTRAMUSCULAR | Status: DC | PRN
  Administered 2022-06-22: 4 mg via INTRAVENOUS

## 2022-06-22 MED ORDER — WATER FOR IRRIGATION, STERILE IR SOLN
Status: DC | PRN
  Administered 2022-06-22: 3900 mL via INTRAVESICAL

## 2022-06-22 MED ORDER — SODIUM CHLORIDE 0.9 % IR SOLN
Status: DC | PRN
  Administered 2022-06-22: 5200 mL via INTRAVESICAL

## 2022-06-22 MED ORDER — CEPHALEXIN 500 MG PO CAPS: 500.0000 mg | ORAL_CAPSULE | Freq: Three times a day (TID) | ORAL | 0 refills | Status: AC

## 2022-06-22 MED ORDER — CEFAZOLIN SODIUM-DEXTROSE 2-4 GM/100ML-% IV SOLN
2.0000 g | INTRAVENOUS | Status: AC
  Administered 2022-06-22: 2 g via INTRAVENOUS

## 2022-06-22 MED ORDER — ACETAMINOPHEN 500 MG PO TABS
ORAL_TABLET | ORAL | Status: AC
  Filled 2022-06-22: qty 2

## 2022-06-22 MED ORDER — FENTANYL CITRATE (PF) 100 MCG/2ML IJ SOLN
INTRAMUSCULAR | Status: DC | PRN
  Administered 2022-06-22 (×3): 50 ug via INTRAVENOUS

## 2022-06-22 MED ORDER — ACETAMINOPHEN 500 MG PO TABS
1000.0000 mg | ORAL_TABLET | Freq: Once | ORAL | Status: AC
  Administered 2022-06-22: 1000 mg via ORAL

## 2022-06-22 MED ORDER — PROPOFOL 500 MG/50ML IV EMUL
INTRAVENOUS | Status: DC | PRN
  Administered 2022-06-22: 200 ug/kg/min via INTRAVENOUS

## 2022-06-22 MED ORDER — AMISULPRIDE (ANTIEMETIC) 5 MG/2ML IV SOLN: 10.0000 mg | Freq: Once | INTRAVENOUS | Status: DC | PRN

## 2022-06-22 MED ORDER — LIDOCAINE HCL (PF) 2 % IJ SOLN
INTRAMUSCULAR | Status: AC
  Filled 2022-06-22: qty 5

## 2022-06-22 MED ORDER — PROPOFOL 500 MG/50ML IV EMUL
INTRAVENOUS | Status: AC
  Filled 2022-06-22: qty 150

## 2022-06-22 MED ORDER — PROMETHAZINE HCL 25 MG/ML IJ SOLN: 6.2500 mg | INTRAMUSCULAR | Status: DC | PRN

## 2022-06-22 MED ORDER — FENTANYL CITRATE (PF) 100 MCG/2ML IJ SOLN
25.0000 ug | INTRAMUSCULAR | Status: DC | PRN
  Administered 2022-06-22 (×4): 25 ug via INTRAVENOUS

## 2022-06-22 MED ORDER — PROPOFOL 10 MG/ML IV BOLUS
INTRAVENOUS | Status: DC | PRN
  Administered 2022-06-22: 70 mg via INTRAVENOUS
  Administered 2022-06-22 (×2): 20 mg via INTRAVENOUS
  Administered 2022-06-22: 50 mg via INTRAVENOUS

## 2022-06-22 MED ORDER — LACTATED RINGERS IV SOLN: INTRAVENOUS | Status: DC

## 2022-06-22 MED ORDER — DEXAMETHASONE SODIUM PHOSPHATE 4 MG/ML IJ SOLN
INTRAMUSCULAR | Status: DC | PRN
  Administered 2022-06-22: 5 mg via INTRAVENOUS

## 2022-06-22 MED ORDER — DEXTROSE 50 % IV SOLN
25.0000 mL | Freq: Once | INTRAVENOUS | Status: AC
  Administered 2022-06-22: 25 mL via INTRAVENOUS

## 2022-06-22 MED ORDER — FENTANYL CITRATE (PF) 100 MCG/2ML IJ SOLN
INTRAMUSCULAR | Status: AC
  Filled 2022-06-22: qty 2

## 2022-06-22 MED ORDER — CEFAZOLIN SODIUM-DEXTROSE 2-4 GM/100ML-% IV SOLN
INTRAVENOUS | Status: AC
  Filled 2022-06-22: qty 100

## 2022-06-22 MED ORDER — DEXTROSE 50 % IV SOLN
INTRAVENOUS | Status: AC
  Filled 2022-06-22: qty 50

## 2022-06-22 MED ORDER — DEXAMETHASONE SODIUM PHOSPHATE 10 MG/ML IJ SOLN
INTRAMUSCULAR | Status: AC
  Filled 2022-06-22: qty 1

## 2022-06-22 MED ORDER — TRAMADOL HCL 50 MG PO TABS: 50.0000 mg | ORAL_TABLET | Freq: Four times a day (QID) | ORAL | 0 refills | Status: DC | PRN

## 2022-06-22 MED ORDER — PHENYLEPHRINE 80 MCG/ML (10ML) SYRINGE FOR IV PUSH (FOR BLOOD PRESSURE SUPPORT)
PREFILLED_SYRINGE | INTRAVENOUS | Status: DC | PRN
  Administered 2022-06-22 (×3): 160 ug via INTRAVENOUS
  Administered 2022-06-22: 240 ug via INTRAVENOUS

## 2022-06-22 MED ORDER — PHENYLEPHRINE 80 MCG/ML (10ML) SYRINGE FOR IV PUSH (FOR BLOOD PRESSURE SUPPORT)
PREFILLED_SYRINGE | INTRAVENOUS | Status: AC
  Filled 2022-06-22: qty 10

## 2022-06-22 MED ORDER — OXYCODONE HCL 5 MG PO TABS
ORAL_TABLET | ORAL | Status: AC
  Filled 2022-06-22: qty 1

## 2022-06-22 MED ORDER — 0.9 % SODIUM CHLORIDE (POUR BTL) OPTIME
TOPICAL | Status: DC | PRN
  Administered 2022-06-22: 500 mL

## 2022-06-22 MED ORDER — OXYCODONE HCL 5 MG PO TABS
5.0000 mg | ORAL_TABLET | Freq: Once | ORAL | Status: AC
  Administered 2022-06-22: 5 mg via ORAL

## 2022-06-22 SURGICAL SUPPLY — 24 items
BAG DRAIN URO-CYSTO SKYTR STRL (DRAIN) ×1 IMPLANT
BAG DRN UROCATH (DRAIN) ×1
CATH FOLEY 2WAY SLVR  5CC 16FR (CATHETERS) ×1
CATH FOLEY 2WAY SLVR 5CC 16FR (CATHETERS) IMPLANT
CLOTH BEACON ORANGE TIMEOUT ST (SAFETY) ×1 IMPLANT
ELECT REM PT RETURN 9FT ADLT (ELECTROSURGICAL) ×1
ELECTRODE REM PT RTRN 9FT ADLT (ELECTROSURGICAL) ×1 IMPLANT
GLOVE BIO SURGEON STRL SZ 6.5 (GLOVE) ×1 IMPLANT
GOWN STRL REUS W/TWL LRG LVL3 (GOWN DISPOSABLE) ×1 IMPLANT
IV NS IRRIG 3000ML ARTHROMATIC (IV SOLUTION) IMPLANT
KIT TURNOVER CYSTO (KITS) ×1 IMPLANT
LOOP CUT BIPOLAR 24F LRG (ELECTROSURGICAL) IMPLANT
MANIFOLD NEPTUNE II (INSTRUMENTS) ×1 IMPLANT
NDL ASPIRATION 22 (NEEDLE) ×1 IMPLANT
NDL SAFETY ECLIP 18X1.5 (MISCELLANEOUS) ×1 IMPLANT
NEEDLE ASPIRATION 22 (NEEDLE) ×1 IMPLANT
PACK CYSTO (CUSTOM PROCEDURE TRAY) ×1 IMPLANT
SYR 20ML LL LF (SYRINGE) ×1 IMPLANT
SYR CONTROL 10ML LL (SYRINGE) ×1 IMPLANT
SYR TOOMEY IRRIG 70ML (MISCELLANEOUS) ×1
SYRINGE TOOMEY IRRIG 70ML (MISCELLANEOUS) IMPLANT
TUBE CONNECTING 12X1/4 (SUCTIONS) ×1 IMPLANT
TUBING UROLOGY SET (TUBING) ×1 IMPLANT
WATER STERILE IRR 3000ML UROMA (IV SOLUTION) ×1 IMPLANT

## 2022-06-22 NOTE — Transfer of Care (Signed)
Immediate Anesthesia Transfer of Care Note  Patient: Karina Foster  Procedure(s) Performed: CYSTOSCOPY, BLADDER BIOPSY, AND FULGERATION (Bladder) ABORTED BOTOX INJECTION 75 UNITS (Bladder)  Patient Location: PACU  Anesthesia Type:General  Level of Consciousness: awake, alert , oriented and patient cooperative  Airway & Oxygen Therapy: Patient Spontanous Breathing  Post-op Assessment: Report given to RN and Post -op Vital signs reviewed and stable  Post vital signs: Reviewed and stable  Last Vitals:  Vitals Value Taken Time  BP 149/77 06/22/22 1338  Temp    Pulse 81 06/22/22 1342  Resp 14 06/22/22 1342  SpO2 94 % 06/22/22 1342  Vitals shown include unvalidated device data.  Last Pain:  Vitals:   06/22/22 1219  TempSrc: Oral  PainSc: 2       Patients Stated Pain Goal: 2 (19/16/60 6004)  Complications: No notable events documented.

## 2022-06-22 NOTE — Op Note (Signed)
Operative Note  Preoperative diagnosis:  1.  Overactive bladder 2.  Urinary uge incontinence  Postoperative diagnosis: 1.  Overactive bladder 2.  Urinary uge incontinence 3.  Patchy, erythema and oozing bladder mucosa  Procedure(s): 1.  Cystoscopy with bladder biopsy and fulguration  Surgeon: Jacalyn Lefevre, MD  Assistants:  None  Anesthesia:  General  Complications:  None  EBL:  minimal  Specimens: 1. none  Drains/Catheters: 1.  none  Intraoperative findings:   Normal urethra Bilateral orthotropic Uos Patchy erythema on posterior superior and left lateral bladder wall Sloughing, friable bladder mucosa that bleed easily when cystoscope brushes against it  Indication:  Karina Foster is a 81 y.o. female with OAB and urge urinary incontinence who has failed beta 3 agonists.   Description of procedure: After risks and benefits of the procedure discussed with the patient, informed consent was obtained.  The patient taken to the operating room placed in the supine position.  Anesthesia was induced and antibiotics were administered.  She was then repositioned in the dorsolithotomy position.  The patient was prepped and draped in the usual sterile fashion.  A timeout was performed.  A 21 Mensinger rigid cystoscope was placed in the urethra meatus and advanced into the bladder.  Findings noted above.  Patient was noted to have scattered patchy erythema in the posterior superior and left lateral bladder wall.  The tissue appeared friable and was actively bleeding.  The decision was made to postpone Botox injections and proceed with bladder biopsy with fulguration.  Cold cup bladder biopsy was then taken of the posterior superior bladder wall as well as the left lateral bladder wall.  Due to poor visualization and fulgurating bladder mucosa with the Bugbee the decision was made to use the resectoscope and bipolar loop to achieve hemostasis.  The loop was used for cautery until  hemostasis was adequate with the irrigant turned off.  The resectoscope was removed.  A 16 Bulson indwelling Foley catheter was then placed at the end of the case and inflated with 10 cc sterile water.  The patient emerged from anesthesia and transferred the PACU in stable condition.   Plan: Patient was not tolerating Foley catheter well in the PACU.  Decision was made to remove the Foley catheter.  Pending biopsy results may reschedule Botox in 3 to 4 weeks.

## 2022-06-22 NOTE — Anesthesia Postprocedure Evaluation (Signed)
Anesthesia Post Note  Patient: Karina Foster  Procedure(s) Performed: CYSTOSCOPY, BLADDER BIOPSY, AND FULGERATION (Bladder) ABORTED BOTOX INJECTION 75 UNITS (Bladder)     Patient location during evaluation: PACU Anesthesia Type: General Level of consciousness: sedated Pain management: pain level controlled Vital Signs Assessment: post-procedure vital signs reviewed and stable Respiratory status: spontaneous breathing and respiratory function stable Cardiovascular status: stable Postop Assessment: no apparent nausea or vomiting Anesthetic complications: no   No notable events documented.  Last Vitals:  Vitals:   06/22/22 1425 06/22/22 1430  BP:  123/64  Pulse: 75 73  Resp: 17 14  Temp:    SpO2: 100% 98%    Last Pain:  Vitals:   06/22/22 1430  TempSrc:   PainSc: 4                  Jeanette Moffatt DANIEL

## 2022-06-22 NOTE — Anesthesia Procedure Notes (Signed)
Procedure Name: LMA Insertion Date/Time: 06/22/2022 1:00 PM  Performed by: Rogers Blocker, CRNAPre-anesthesia Checklist: Patient identified, Emergency Drugs available, Suction available and Patient being monitored Patient Re-evaluated:Patient Re-evaluated prior to induction Oxygen Delivery Method: Circle System Utilized Preoxygenation: Pre-oxygenation with 100% oxygen Induction Type: IV induction Ventilation: Mask ventilation without difficulty LMA: LMA inserted LMA Size: 4.0 Number of attempts: 1 Placement Confirmation: positive ETCO2 Tube secured with: Tape Dental Injury: Teeth and Oropharynx as per pre-operative assessment

## 2022-06-22 NOTE — Interval H&P Note (Signed)
History and Physical Interval Note:  06/22/2022 11:43 AM  Karina Foster  has presented today for surgery, with the diagnosis of OVERACTIVE BLADDER.  The various methods of treatment have been discussed with the patient and family. After consideration of risks, benefits and other options for treatment, the patient has consented to  Procedure(s) with comments: CYSTOSCOPY (N/A) - 30 MINS BOTOX INJECTION 75 UNITS (N/A) as a surgical intervention.  The patient's history has been reviewed, patient examined, no change in status, stable for surgery.  I have reviewed the patient's chart and labs.  Questions were answered to the patient's satisfaction.     Lutie Pickler D Antonio Creswell

## 2022-06-22 NOTE — Discharge Instructions (Addendum)
Post Bladder Surgery Instructions   General instructions:     Your recent bladder surgery requires very little post hospital care but some definite precautions.  Despite the fact that no skin incisions were used, the area around the bladder incisions are raw and covered with scabs to promote healing and prevent bleeding. Certain precautions are needed to insure that the scabs are not disturbed over the next 2-4 weeks while the healing proceeds.  Because the raw surface inside your bladder and the irritating effects of urine you may expect frequency of urination and/or urgency (a stronger desire to urinate) and perhaps even getting up at night more often. This will usually resolve or improve slowly over the healing period. You may see some blood in your urine over the first 6 weeks. Do not be alarmed, even if the urine was clear for a while. Get off your feet and drink lots of fluids until clearing occurs. If you start to pass clots or don't improve call us.  Catheter: (If you are discharged with a catheter.)  1. Keep your catheter secured to your leg at all times with tape or the supplied strap. 2. You may experience leakage of urine around your catheter- as long as the  catheter continues to drain, this is normal.  If your catheter stops draining  go to the ER. 3. You may also have blood in your urine, even after it has been clear for  several days; you may even pass some small blood clots or other material.  This  is normal as well.  If this happens, sit down and drink plenty of water to help  make urine to flush out your bladder.  If the blood in your urine becomes worse  after doing this, contact our office or return to the ER. 4. You may use the leg bag (small bag) during the day, but use the large bag at  night.  Diet:  You may return to your normal diet immediately. Because of the raw surface of your bladder, alcohol, spicy foods, foods high in acid and drinks with caffeine may  cause irritation or frequency and should be used in moderation. To keep your urine flowing freely and avoid constipation, drink plenty of fluids during the day (8-10 glasses). Tip: Avoid cranberry juice because it is very acidic.  Activity:  Your physical activity doesn't need to be restricted. However, if you are very active, you may see some blood in the urine. We suggest that you reduce your activity under the circumstances until the bleeding has stopped.  Bowels:  It is important to keep your bowels regular during the postoperative period. Straining with bowel movements can cause bleeding. A bowel movement every other day is reasonable. Use a mild laxative if needed, such as milk of magnesia 2-3 tablespoons, or 2 Dulcolax tablets. Call if you continue to have problems. If you had been taking narcotics for pain, before, during or after your surgery, you may be constipated. Take a laxative if necessary.    Medication:  You should resume your pre-surgery medications unless told not to. In addition you may be given an antibiotic to prevent or treat infection. Antibiotics are not always necessary. All medication should be taken as prescribed until the bottles are finished unless you are having an unusual reaction to one of the drugs.      Post Anesthesia Home Care Instructions  Activity: Get plenty of rest for the remainder of the day. A responsible individual must stay  with you for 24 hours following the procedure.  For the next 24 hours, DO NOT: -Drive a car -Paediatric nurse -Drink alcoholic beverages -Take any medication unless instructed by your physician -Make any legal decisions or sign important papers.  Meals: Start with liquid foods such as gelatin or soup. Progress to regular foods as tolerated. Avoid greasy, spicy, heavy foods. If nausea and/or vomiting occur, drink only clear liquids until the nausea and/or vomiting subsides. Call your physician if vomiting  continues.  Special Instructions/Symptoms: Your throat may feel dry or sore from the anesthesia or the breathing tube placed in your throat during surgery. If this causes discomfort, gargle with warm salt water. The discomfort should disappear within 24 hours.  Do not take any Tylenol until after 6:30 pm today, if needed.

## 2022-06-22 NOTE — Anesthesia Preprocedure Evaluation (Addendum)
Anesthesia Evaluation  Patient identified by MRN, date of birth, ID band Patient awake    Reviewed: Allergy & Precautions, NPO status , Patient's Chart, lab work & pertinent test results  History of Anesthesia Complications Negative for: history of anesthetic complications  Airway Mallampati: II  TM Distance: >3 FB Neck ROM: Full    Dental no notable dental hx. (+) Dental Advisory Given   Pulmonary neg pulmonary ROS, former smoker,    Pulmonary exam normal        Cardiovascular hypertension, Pt. on medications + CAD and + Cardiac Stents  Normal cardiovascular exam     Neuro/Psych PSYCHIATRIC DISORDERS Depression negative neurological ROS     GI/Hepatic Neg liver ROS, GERD  ,  Endo/Other  diabetes  Renal/GU negative Renal ROS     Musculoskeletal negative musculoskeletal ROS (+)   Abdominal   Peds  Hematology negative hematology ROS (+)   Anesthesia Other Findings   Reproductive/Obstetrics                            Anesthesia Physical Anesthesia Plan  ASA: 3  Anesthesia Plan: MAC   Post-op Pain Management: Tylenol PO (pre-op)*   Induction:   PONV Risk Score and Plan: 2 and Ondansetron and Propofol infusion  Airway Management Planned: Natural Airway  Additional Equipment:   Intra-op Plan:   Post-operative Plan:   Informed Consent: I have reviewed the patients History and Physical, chart, labs and discussed the procedure including the risks, benefits and alternatives for the proposed anesthesia with the patient or authorized representative who has indicated his/her understanding and acceptance.     Dental advisory given  Plan Discussed with: Anesthesiologist and CRNA  Anesthesia Plan Comments:        Anesthesia Quick Evaluation

## 2022-06-23 ENCOUNTER — Encounter (HOSPITAL_BASED_OUTPATIENT_CLINIC_OR_DEPARTMENT_OTHER): Payer: Self-pay | Admitting: Urology

## 2022-06-24 LAB — SURGICAL PATHOLOGY

## 2022-07-06 DIAGNOSIS — R350 Frequency of micturition: Secondary | ICD-10-CM | POA: Diagnosis not present

## 2022-07-06 DIAGNOSIS — R3915 Urgency of urination: Secondary | ICD-10-CM | POA: Diagnosis not present

## 2022-07-06 DIAGNOSIS — N3941 Urge incontinence: Secondary | ICD-10-CM | POA: Diagnosis not present

## 2022-07-09 ENCOUNTER — Other Ambulatory Visit: Payer: Self-pay | Admitting: Urology

## 2022-07-13 DIAGNOSIS — F39 Unspecified mood [affective] disorder: Secondary | ICD-10-CM | POA: Diagnosis not present

## 2022-07-13 DIAGNOSIS — G629 Polyneuropathy, unspecified: Secondary | ICD-10-CM | POA: Diagnosis not present

## 2022-07-13 DIAGNOSIS — N6310 Unspecified lump in the right breast, unspecified quadrant: Secondary | ICD-10-CM | POA: Diagnosis not present

## 2022-07-13 DIAGNOSIS — E038 Other specified hypothyroidism: Secondary | ICD-10-CM | POA: Diagnosis not present

## 2022-07-13 DIAGNOSIS — I10 Essential (primary) hypertension: Secondary | ICD-10-CM | POA: Diagnosis not present

## 2022-07-13 DIAGNOSIS — H9190 Unspecified hearing loss, unspecified ear: Secondary | ICD-10-CM | POA: Diagnosis not present

## 2022-07-13 DIAGNOSIS — E1149 Type 2 diabetes mellitus with other diabetic neurological complication: Secondary | ICD-10-CM | POA: Diagnosis not present

## 2022-07-13 DIAGNOSIS — I2510 Atherosclerotic heart disease of native coronary artery without angina pectoris: Secondary | ICD-10-CM | POA: Diagnosis not present

## 2022-07-13 DIAGNOSIS — G4733 Obstructive sleep apnea (adult) (pediatric): Secondary | ICD-10-CM | POA: Diagnosis not present

## 2022-07-13 DIAGNOSIS — N3281 Overactive bladder: Secondary | ICD-10-CM | POA: Diagnosis not present

## 2022-07-13 DIAGNOSIS — E785 Hyperlipidemia, unspecified: Secondary | ICD-10-CM | POA: Diagnosis not present

## 2022-07-13 DIAGNOSIS — K148 Other diseases of tongue: Secondary | ICD-10-CM | POA: Diagnosis not present

## 2022-07-15 ENCOUNTER — Other Ambulatory Visit: Payer: Self-pay | Admitting: Internal Medicine

## 2022-07-15 ENCOUNTER — Encounter (HOSPITAL_BASED_OUTPATIENT_CLINIC_OR_DEPARTMENT_OTHER): Payer: Self-pay | Admitting: Urology

## 2022-07-15 DIAGNOSIS — N6310 Unspecified lump in the right breast, unspecified quadrant: Secondary | ICD-10-CM

## 2022-07-15 NOTE — Progress Notes (Signed)
Spoke w/ via phone for pre-op interview--- pt Lab needs dos----     [employer] results------ current EKG in epic/ chart COVID test -----patient states asymptomatic no test needed Arrive at -------  0600 on 07-20-2022 NPO after MN NO Solid Food.  Clear liquids from MN until--- 0500 Med rec completed Medications to take morning of surgery ----- lopressor, celexa, zantac Diabetic medication ----- do not do humalog morning of surgery, do half dose tresiba day before surgery Patient instructed no nail polish to be worn day of surgery Patient instructed to bring photo id and insurance card day of surgery Patient aware to have Driver (ride ) / caregiver for 24 hours after surgery -- husband, robert Patient Special Instructions ----- n/a Pre-Op special Istructions -----  pt Libre CGM on right arm Patient verbalized understanding of instructions that were given at this phone interview. Patient denies shortness of breath, chest pain, fever, cough at this phone interview.   Anesthesia Review: HTN;  CAD hx NSTEMI s/p PCI w/ DES x2 to prox and mid LAD 04/ 2010;  IDDM2;  OSA (no dental appliance in few yrs) Pt denies cardiac s&s, no peripheral swelling, but sob when she over exerts. Stated has not taken a nitro since having prescription 2010  PCP:  Dr Dagmar Hait Cardiologist :  Dr Einar Gip (lov 10-23-2020 epic) EKG :  06-22-2022 Echo : 06-16-2017 Stress test:  nuclear 05-23-2017 Cardiac Cath : 12-16-2008 Sleep Study/ CPAP : Yes/ no Fasting Blood Sugar : 110s     / Checks Blood Sugar -- times a day:  multiple times w/ Elenor Legato Blood Thinner/ Instructions Maryjane Hurter Dose: no ASA / Instructions/ Last Dose : ASA '325mg'$ /  pt stated was given instructions by Dr Claudia Desanctis to continue asa but don't take morning of surgery

## 2022-07-19 NOTE — H&P (Signed)
CC/HPI: cc: Urinary frequency/urgency   04/23/2022: 81 year old woman referred for urinary frequency, urgency, and urge incontinence who has failed multiple oral therapies. She has tried both Risk manager without success. She has been on 50 mg of Myrbetriq for years. She wears an incontinence diaper with a pad in it during the day and changes multiple times a day. Her incontinence is all associated with urgency. She has no stress urinary incontinence. She drinks 1 to 2 cups of coffee a day but no carbonation or spicy food. She is try to cut this out before not had any improvement in symptoms. She also gets urinary tract infections and was treated for a UTI with Macrobid last week. Her husband is a patient of Dr. Milford Cage. She is history of breast cancer and underwent surgery, chemotherapy and radiation in 1997. She is not on any antiestrogen medication now. No stress urinary incontinence.   Overactive bladder symptom quids: 5, 5, 5, 4, 5, 5, 5, 4=38/40   05/28/22: 81 year old woman with a history of urinary frequency and urgency comes in today for urodynamics results. She also feels like she is starting with a UTI. She been experiencing dysuria and pressure for the last few days. Urinalysis does appear consistent with infection.    UDS SUMMARY  Karina Foster held a max capacity of approx. 172 mls. Her 1st sensation was felt at 109 mls. No SUI noted. There was positive instability. She voided involuntary with urgency off an unstable contraction. He was able to generate a voluntary contraction and void 41 mls with max flow of 5 ml/s. EMG leads were basically quiet during the voiding phase. PVR was approx. 175 mls. No trabeculation was noted. No reflux was seen.    07/06/2022:  Patient returns for 2-week follow-up status post cystoscopy with biopsy. Patient was originally scheduled for vesicular Botox infiltration for refractory OAB. However, Dr. Claudia Desanctis deferred Botox at that time due to presence of a  suspicious lesion, which she biopsied. Few days ago, she endorses passage of a small blood clot with some bright red streaking associated. Thankfully, pathology report was benign. Patient remains at baseline urinary function. No interim concerns. She would like to proceed with rescheduling her initial procedure for Botox at this time.     ALLERGIES: Erythromycin    MEDICATIONS: Aspirin 325 mg tablet  Metoprolol Succinate 25 mg tablet, extended release 24 hr  Omeprazole 20 mg capsule,delayed release  Allreds  Citalopram Hbr 40 mg tablet  Estradiol 0.01 % cream with applicator 1 gram Transdermal Q HS Place 1 g of cream in the vagina nightly for 14 days then twice a week thereafter  Ezetimibe  Humalog  Meclizine Hcl 25 mg tablet  Ramipril 2.5 mg capsule  Tresiba  Triamcinolone Acetonide 0.1 % paste  Vitamin D3     GU PSH: Complex cystometrogram, w/ void pressure and urethral pressure profile studies, any technique - 05/21/2022 Complex Uroflow - 05/21/2022 Cysto Bladder Ureth Biopsy - 06/22/2022 Emg surf Electrd - 05/21/2022 Inject For cystogram - 05/21/2022 Intrabd voidng Press - 05/21/2022     NON-GU PSH: Bilateral Tubal Ligation Breast Biopsy PARTIAL MASTECTOMY     GU PMH: Acute Cystitis/UTI - 05/28/2022 Chronic cystitis (w/o hematuria) - 05/28/2022, - 04/23/2022 Urge incontinence - 05/28/2022, - 05/21/2022, - 04/23/2022 Urinary Frequency - 05/28/2022, - 04/23/2022 Urinary Urgency - 05/28/2022, - 04/23/2022      PMH Notes: Diabetes.  Neuropathy.   NON-GU PMH: Anxiety Arthritis Asthma Breast Cancer, History Depression GERD Heart disease, unspecified Hypercholesterolemia  Hypertension Sleep Apnea    FAMILY HISTORY: 1 son - Runs in Family Heart Disease - Father   SOCIAL HISTORY: Marital Status: Married Preferred Language: English; Ethnicity: Not Hispanic Or Latino; Race: White Current Smoking Status: Patient does not smoke anymore.   Tobacco Use Assessment Completed: Used  Tobacco in last 30 days? Drinks 2 drinks per day.  Drinks 2 caffeinated drinks per day.    REVIEW OF SYSTEMS:    GU Review Female:   Patient reports frequent urination, hard to postpone urination, get up at night to urinate, and leakage of urine. Patient denies burning /pain with urination, stream starts and stops, trouble starting your stream, have to strain to urinate, and being pregnant.  Gastrointestinal (Upper):   Patient denies nausea, vomiting, and indigestion/ heartburn.  Gastrointestinal (Lower):   Patient denies constipation and diarrhea.  Constitutional:   Patient denies fever, night sweats, weight loss, and fatigue.  Skin:   Patient denies skin rash/ lesion and itching.  Eyes:   Patient denies blurred vision and double vision.  Ears/ Nose/ Throat:   Patient denies sore throat and sinus problems.  Hematologic/Lymphatic:   Patient denies swollen glands and easy bruising.  Cardiovascular:   Patient denies leg swelling and chest pains.  Respiratory:   Patient denies cough and shortness of breath.  Endocrine:   Patient denies excessive thirst.  Musculoskeletal:   Patient denies back pain and joint pain.  Neurological:   Patient denies headaches and dizziness.  Psychologic:   Patient denies depression and anxiety.   VITAL SIGNS:      07/06/2022 03:07 PM  Weight 155 lb / 70.31 kg  Height 67 in / 170.18 cm  BP 115/71 mmHg  Pulse 80 /min  Temperature 96.6 F / 35.8 C  BMI 24.3 kg/m   MULTI-SYSTEM PHYSICAL EXAMINATION:    Constitutional: Well-nourished. No physical deformities. Normally developed. Good grooming.  Neck: Neck symmetrical, not swollen. Normal tracheal position.  Respiratory: No labored breathing, no use of accessory muscles.   Cardiovascular: Normal temperature, normal extremity pulses, no swelling.   Skin: No paleness, no jaundice, no cyanosis.   Neurologic / Psychiatric: Oriented to time, oriented to place, oriented to person. No depression, no anxiety, no  agitation.  Gastrointestinal: no mass, no suprapubic or bilateral CVA tenderness, no rigidity, non obese abdomen.   Musculoskeletal: Normal gait and station of head and neck.     Complexity of Data:  Source Of History:  Patient, Medical Record Summary  Records Review:   Pathology Reports, Previous Doctor Records, Previous Hospital Records, Previous Patient Records  Urine Test Review:   Urinalysis, Urine Culture  Urodynamics Review:   Review Bladder Scan   07/06/22  Urinalysis  Urine Appearance Slightly Cloudy   Urine Color Yellow   Urine Glucose Neg mg/dL  Urine Bilirubin Neg mg/dL  Urine Ketones Neg mg/dL  Urine Specific Gravity 1.020   Urine Blood 3+ ery/uL  Urine pH 5.5   Urine Protein Trace mg/dL  Urine Urobilinogen 0.2 mg/dL  Urine Nitrites Neg   Urine Leukocyte Esterase 2+ leu/uL  Urine WBC/hpf 10 - 20/hpf   Urine RBC/hpf 10 - 20/hpf   Urine Epithelial Cells 6 - 10/hpf   Urine Bacteria Few (10-25/hpf)   Urine Mucous Not Present   Urine Yeast NS (Not Seen)   Urine Trichomonas Not Present   Urine Cystals NS (Not Seen)   Urine Casts NS (Not Seen)   Urine Sperm Not Present    PROCEDURES:  Urinalysis w/Scope Dipstick Dipstick Cont'd Micro  Color: Yellow Bilirubin: Neg mg/dL WBC/hpf: 10 - 20/hpf  Appearance: Slightly Cloudy Ketones: Neg mg/dL RBC/hpf: 10 - 20/hpf  Specific Gravity: 1.020 Blood: 3+ ery/uL Bacteria: Few (10-25/hpf)  pH: 5.5 Protein: Trace mg/dL Cystals: NS (Not Seen)  Glucose: Neg mg/dL Urobilinogen: 0.2 mg/dL Casts: NS (Not Seen)    Nitrites: Neg Trichomonas: Not Present    Leukocyte Esterase: 2+ leu/uL Mucous: Not Present      Epithelial Cells: 6 - 10/hpf      Yeast: NS (Not Seen)      Sperm: Not Present    Notes: renal epithelial present    ASSESSMENT:      ICD-10 Details  1 GU:   Urge incontinence - N39.41 Chronic, Stable  2   Urinary Frequency - R35.0 Chronic, Stable  3   Urinary Urgency - R39.15 Chronic, Stable   PLAN:            Document Letter(s):  Created for Patient: Clinical Summary         Notes:   Today, UA with signs of contamination. However, out of an abundance of caution, we will send for culture due to upcoming procedure being scheduled and follow-up with patient as appropriate based on C&S report. We reassured patient that passage of the small clot with associated minimal blood streaking is compatible with probable healing of biopsy site. We will proceed with rescheduling her initial Botox procedure. Posting sheet handed to scheduler. Scheduler will follow-up with her with best times going forward for Botox procedure. Patient voiced understanding and is amenable to this plan.

## 2022-07-20 ENCOUNTER — Other Ambulatory Visit: Payer: Self-pay

## 2022-07-20 ENCOUNTER — Encounter (HOSPITAL_BASED_OUTPATIENT_CLINIC_OR_DEPARTMENT_OTHER)
Admission: RE | Disposition: A | Discharge status: Home / Self Care | Payer: Self-pay | Source: Home / Self Care | Attending: Urology

## 2022-07-20 ENCOUNTER — Ambulatory Visit (HOSPITAL_BASED_OUTPATIENT_CLINIC_OR_DEPARTMENT_OTHER): Payer: Medicare Other | Admitting: Anesthesiology

## 2022-07-20 ENCOUNTER — Ambulatory Visit (HOSPITAL_BASED_OUTPATIENT_CLINIC_OR_DEPARTMENT_OTHER)
Admission: RE | Admit: 2022-07-20 | Discharge: 2022-07-20 | Disposition: A | Discharge status: Home / Self Care | Payer: Medicare Other | Attending: Urology | Admitting: Urology

## 2022-07-20 ENCOUNTER — Encounter (HOSPITAL_BASED_OUTPATIENT_CLINIC_OR_DEPARTMENT_OTHER): Payer: Self-pay | Admitting: Urology

## 2022-07-20 DIAGNOSIS — Z955 Presence of coronary angioplasty implant and graft: Secondary | ICD-10-CM | POA: Insufficient documentation

## 2022-07-20 DIAGNOSIS — Z794 Long term (current) use of insulin: Secondary | ICD-10-CM | POA: Diagnosis not present

## 2022-07-20 DIAGNOSIS — I2510 Atherosclerotic heart disease of native coronary artery without angina pectoris: Secondary | ICD-10-CM | POA: Diagnosis not present

## 2022-07-20 DIAGNOSIS — E119 Type 2 diabetes mellitus without complications: Secondary | ICD-10-CM | POA: Insufficient documentation

## 2022-07-20 DIAGNOSIS — I10 Essential (primary) hypertension: Secondary | ICD-10-CM

## 2022-07-20 DIAGNOSIS — N3281 Overactive bladder: Secondary | ICD-10-CM | POA: Diagnosis not present

## 2022-07-20 DIAGNOSIS — I252 Old myocardial infarction: Secondary | ICD-10-CM | POA: Diagnosis not present

## 2022-07-20 DIAGNOSIS — Z87891 Personal history of nicotine dependence: Secondary | ICD-10-CM | POA: Insufficient documentation

## 2022-07-20 DIAGNOSIS — Z87440 Personal history of urinary (tract) infections: Secondary | ICD-10-CM | POA: Diagnosis not present

## 2022-07-20 DIAGNOSIS — F32A Depression, unspecified: Secondary | ICD-10-CM | POA: Insufficient documentation

## 2022-07-20 DIAGNOSIS — K219 Gastro-esophageal reflux disease without esophagitis: Secondary | ICD-10-CM | POA: Diagnosis not present

## 2022-07-20 DIAGNOSIS — Z01818 Encounter for other preprocedural examination: Secondary | ICD-10-CM

## 2022-07-20 HISTORY — PX: BOTOX INJECTION: SHX5754

## 2022-07-20 HISTORY — PX: CYSTOSCOPY: SHX5120

## 2022-07-20 LAB — POCT I-STAT, CHEM 8
BUN: 20 mg/dL (ref 8–23)
Calcium, Ion: 1.13 mmol/L — ABNORMAL LOW (ref 1.15–1.40)
Chloride: 104 mmol/L (ref 98–111)
Creatinine, Ser: 0.9 mg/dL (ref 0.44–1.00)
Glucose, Bld: 168 mg/dL — ABNORMAL HIGH (ref 70–99)
HCT: 40 % (ref 36.0–46.0)
Hemoglobin: 13.6 g/dL (ref 12.0–15.0)
Potassium: 4.5 mmol/L (ref 3.5–5.1)
Sodium: 138 mmol/L (ref 135–145)
TCO2: 23 mmol/L (ref 22–32)

## 2022-07-20 LAB — GLUCOSE, CAPILLARY: Glucose-Capillary: 188 mg/dL — ABNORMAL HIGH (ref 70–99)

## 2022-07-20 SURGERY — CYSTOSCOPY
Anesthesia: General | Site: Bladder

## 2022-07-20 MED ORDER — ONDANSETRON HCL 4 MG/2ML IJ SOLN
INTRAMUSCULAR | Status: DC | PRN
  Administered 2022-07-20: 4 mg via INTRAVENOUS

## 2022-07-20 MED ORDER — FENTANYL CITRATE (PF) 100 MCG/2ML IJ SOLN
INTRAMUSCULAR | Status: DC | PRN
  Administered 2022-07-20: 25 ug via INTRAVENOUS
  Administered 2022-07-20: 50 ug via INTRAVENOUS
  Administered 2022-07-20: 25 ug via INTRAVENOUS

## 2022-07-20 MED ORDER — SODIUM CHLORIDE (PF) 0.9 % IJ SOLN
INTRAMUSCULAR | Status: DC | PRN
  Administered 2022-07-20: 20 mL via INTRAVENOUS

## 2022-07-20 MED ORDER — LIDOCAINE HCL (CARDIAC) PF 100 MG/5ML IV SOSY
PREFILLED_SYRINGE | INTRAVENOUS | Status: DC | PRN
  Administered 2022-07-20: 50 mg via INTRAVENOUS

## 2022-07-20 MED ORDER — DEXAMETHASONE SODIUM PHOSPHATE 4 MG/ML IJ SOLN
INTRAMUSCULAR | Status: DC | PRN
  Administered 2022-07-20: 4 mg via INTRAVENOUS

## 2022-07-20 MED ORDER — ACETAMINOPHEN 325 MG PO TABS: 325.0000 mg | ORAL_TABLET | ORAL | Status: DC | PRN

## 2022-07-20 MED ORDER — CEFAZOLIN SODIUM-DEXTROSE 2-4 GM/100ML-% IV SOLN
INTRAVENOUS | Status: AC
  Filled 2022-07-20: qty 100

## 2022-07-20 MED ORDER — OXYCODONE HCL 5 MG PO TABS
5.0000 mg | ORAL_TABLET | Freq: Once | ORAL | Status: AC | PRN
  Administered 2022-07-20: 5 mg via ORAL

## 2022-07-20 MED ORDER — FENTANYL CITRATE (PF) 100 MCG/2ML IJ SOLN
INTRAMUSCULAR | Status: AC
  Filled 2022-07-20: qty 2

## 2022-07-20 MED ORDER — LACTATED RINGERS IV SOLN: INTRAVENOUS | Status: DC

## 2022-07-20 MED ORDER — LIDOCAINE HCL (PF) 2 % IJ SOLN
INTRAMUSCULAR | Status: AC
  Filled 2022-07-20: qty 5

## 2022-07-20 MED ORDER — ONDANSETRON HCL 4 MG/2ML IJ SOLN
INTRAMUSCULAR | Status: AC
  Filled 2022-07-20: qty 2

## 2022-07-20 MED ORDER — SODIUM CHLORIDE 0.9 % IR SOLN
Status: DC | PRN
  Administered 2022-07-20: 1500 mL
  Administered 2022-07-20: 3000 mL

## 2022-07-20 MED ORDER — WATER FOR IRRIGATION, STERILE IR SOLN
Status: DC | PRN
  Administered 2022-07-20: 1200 mL via INTRAVESICAL

## 2022-07-20 MED ORDER — OXYCODONE HCL 5 MG PO TABS
ORAL_TABLET | ORAL | Status: AC
  Filled 2022-07-20: qty 1

## 2022-07-20 MED ORDER — PROPOFOL 500 MG/50ML IV EMUL
INTRAVENOUS | Status: AC
  Filled 2022-07-20: qty 50

## 2022-07-20 MED ORDER — ONABOTULINUMTOXINA 100 UNITS IJ SOLR
INTRAMUSCULAR | Status: DC | PRN
  Administered 2022-07-20: 75 [IU] via INTRAMUSCULAR

## 2022-07-20 MED ORDER — CEFAZOLIN SODIUM-DEXTROSE 2-4 GM/100ML-% IV SOLN
2.0000 g | INTRAVENOUS | Status: AC
  Administered 2022-07-20: 2 g via INTRAVENOUS

## 2022-07-20 MED ORDER — ACETAMINOPHEN 160 MG/5ML PO SOLN: 325.0000 mg | ORAL | Status: DC | PRN

## 2022-07-20 MED ORDER — OXYCODONE HCL 5 MG/5ML PO SOLN: 5.0000 mg | Freq: Once | ORAL | Status: AC | PRN

## 2022-07-20 MED ORDER — PROPOFOL 10 MG/ML IV BOLUS
INTRAVENOUS | Status: DC | PRN
  Administered 2022-07-20 (×2): 30 mg via INTRAVENOUS
  Administered 2022-07-20: 60 mg via INTRAVENOUS

## 2022-07-20 MED ORDER — PROPOFOL 500 MG/50ML IV EMUL
INTRAVENOUS | Status: DC | PRN
  Administered 2022-07-20: 200 ug/kg/min via INTRAVENOUS

## 2022-07-20 MED ORDER — ARTIFICIAL TEARS OPHTHALMIC OINT
TOPICAL_OINTMENT | OPHTHALMIC | Status: AC
  Filled 2022-07-20: qty 3.5

## 2022-07-20 MED ORDER — PHENYLEPHRINE 80 MCG/ML (10ML) SYRINGE FOR IV PUSH (FOR BLOOD PRESSURE SUPPORT)
PREFILLED_SYRINGE | INTRAVENOUS | Status: DC | PRN
  Administered 2022-07-20 (×3): 160 ug via INTRAVENOUS
  Administered 2022-07-20: 80 ug via INTRAVENOUS

## 2022-07-20 MED ORDER — DEXAMETHASONE SODIUM PHOSPHATE 10 MG/ML IJ SOLN
INTRAMUSCULAR | Status: AC
  Filled 2022-07-20: qty 1

## 2022-07-20 MED ORDER — PHENYLEPHRINE 80 MCG/ML (10ML) SYRINGE FOR IV PUSH (FOR BLOOD PRESSURE SUPPORT)
PREFILLED_SYRINGE | INTRAVENOUS | Status: AC
  Filled 2022-07-20: qty 10

## 2022-07-20 MED ORDER — PROPOFOL 10 MG/ML IV BOLUS
INTRAVENOUS | Status: AC
  Filled 2022-07-20: qty 20

## 2022-07-20 MED ORDER — 0.9 % SODIUM CHLORIDE (POUR BTL) OPTIME
TOPICAL | Status: DC | PRN
  Administered 2022-07-20: 500 mL

## 2022-07-20 SURGICAL SUPPLY — 25 items
BAG DRAIN URO-CYSTO SKYTR STRL (DRAIN) ×1 IMPLANT
BAG DRN UROCATH (DRAIN) ×1
CLOTH BEACON ORANGE TIMEOUT ST (SAFETY) ×1 IMPLANT
ELECT REM PT RETURN 9FT ADLT (ELECTROSURGICAL) ×1
ELECTRODE REM PT RTRN 9FT ADLT (ELECTROSURGICAL) ×1 IMPLANT
GLOVE BIO SURGEON STRL SZ 6.5 (GLOVE) ×1 IMPLANT
GOWN STRL REUS W/TWL LRG LVL3 (GOWN DISPOSABLE) ×1 IMPLANT
IV NS IRRIG 3000ML ARTHROMATIC (IV SOLUTION) IMPLANT
KIT TURNOVER CYSTO (KITS) ×1 IMPLANT
LOOP CUT BIPOLAR 24F LRG (ELECTROSURGICAL) IMPLANT
MANIFOLD NEPTUNE II (INSTRUMENTS) ×1 IMPLANT
NDL ASPIRATION 22 (NEEDLE) ×1 IMPLANT
NDL SAFETY ECLIP 18X1.5 (MISCELLANEOUS) ×1 IMPLANT
NEEDLE ASPIRATION 22 (NEEDLE) ×1 IMPLANT
NEEDLE HYPO 22GX1.5 SAFETY (NEEDLE) IMPLANT
NS IRRIG 500ML POUR BTL (IV SOLUTION) IMPLANT
PACK CYSTO (CUSTOM PROCEDURE TRAY) ×1 IMPLANT
SYR 10ML LL (SYRINGE) IMPLANT
SYR 20ML LL LF (SYRINGE) ×1 IMPLANT
SYR CONTROL 10ML LL (SYRINGE) ×1 IMPLANT
SYR TOOMEY IRRIG 70ML (MISCELLANEOUS) ×1
SYRINGE TOOMEY IRRIG 70ML (MISCELLANEOUS) IMPLANT
TUBE CONNECTING 12X1/4 (SUCTIONS) ×1 IMPLANT
TUBING UROLOGY SET (TUBING) ×1 IMPLANT
WATER STERILE IRR 3000ML UROMA (IV SOLUTION) ×1 IMPLANT

## 2022-07-20 NOTE — Interval H&P Note (Signed)
History and Physical Interval Note:  Patient was here for cystoscopy with Botox several weeks ago however patient's bladder had some erythema that was concerning.  She underwent biopsy that did not show any evidence of malignancy.  She is now here today for 75 units of Botox.  07/20/2022 7:25 AM  Karina Foster  has presented today for surgery, with the diagnosis of OVERACTIVE BLADDER.  The various methods of treatment have been discussed with the patient and family. After consideration of risks, benefits and other options for treatment, the patient has consented to  Procedure(s) with comments: CYSTOSCOPY (N/A) - 30 MINS BOTOX INJECTION (N/A) as a surgical intervention.  The patient's history has been reviewed, patient examined, no change in status, stable for surgery.  I have reviewed the patient's chart and labs.  Questions were answered to the patient's satisfaction.     [name] D Deann Mclaine

## 2022-07-20 NOTE — Progress Notes (Signed)
Patient answered yes to the question of wishing she could go to sleep and not wake up in the past month. Patient denied other questions in questionnaire. Patient is on anti depressant and stated that she has resources through her doctor, Dr. Dagmar Hait. RN asked if she needed additional resources and she declined. Patient stated "It's my bladder that is my concern." Will report to Surveyor, quantity.

## 2022-07-20 NOTE — Op Note (Signed)
Operative Note  Preoperative diagnosis:  1.  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  Overactive bladder refractory to medication  Procedure(s): 1.  Cystoscopy with 75 units of botox 2.  Fulguration of prior biopsy sites  Surgeon: Jacalyn Lefevre, MD  Assistants:  None  Anesthesia:  General  Complications:  None  EBL:  10 mL  Specimens: 1. none  Drains/Catheters: 1.  none  Intraoperative findings:   Normal urethra Bilateral orthotopic UOs seen at beginning and end of case  Prior biopsy sites with sloughing fibrinous exudate and oozing 75 units of Botox mixed with 15 cc sterile saline  Indication:  Karina Foster is a 81 y.o. female with overactive bladder refractory to medication here for botox injections.  Patient previously was here for Botox injections however had erythema in the bladder underwent bladder biopsy which was negative for malignancy.  Description of procedure:  After risks and benefits of the procedure were discussed with the patient, informed consent was obtained.  Patient was taken to the operating room placed in supine position.  Anesthesia was induced and antibiotics were administered.  Patient was then repositioned in the dorsolithotomy position.  She was prepped and draped in the usual sterile fashion time was performed.  The injection scope was placed in the meatus and advanced into the bladder.  75 units of Botox were injected over 15 sites through the scope on the posterior bladder wall.  Care was taken to avoid the ureteral orifice.  The prior biopsy sites were seen and oozing.  Decision was made to fulgurate these areas to help achieve hemostasis.  The injection scope was removed and the resectoscope using the bipolar loop was assembled.  This was advanced into the bladder and fulguration was achieved.  Hemostasis was deemed adequate with irrigant turned off.  The bladder was decompressed and the resectoscope was  removed.  The number from anesthesia and transferred back in stable condition.  Plan: Follow-up in 2 weeks

## 2022-07-20 NOTE — Anesthesia Postprocedure Evaluation (Signed)
Anesthesia Post Note  Patient: Karina Foster  Procedure(s) Performed: CYSTOSCOPY, BLADDER FULGERATION (Bladder) BOTOX INJECTION (Bladder)     Patient location during evaluation: PACU Anesthesia Type: General Level of consciousness: awake Pain management: pain level controlled Vital Signs Assessment: post-procedure vital signs reviewed and stable Respiratory status: spontaneous breathing Cardiovascular status: stable Postop Assessment: no apparent nausea or vomiting Anesthetic complications: no  No notable events documented.  Last Vitals:  Vitals:   07/20/22 0900 07/20/22 0910  BP: (!) 155/68 (!) 142/109  Pulse: 78 78  Resp: 18 13  Temp:    SpO2: 96% 98%    Last Pain:  Vitals:   07/20/22 0900  TempSrc:   PainSc: [place] Jr

## 2022-07-20 NOTE — Transfer of Care (Signed)
Immediate Anesthesia Transfer of Care Note  Patient: Karina Foster  Procedure(s) Performed: Procedure(s) (LRB): CYSTOSCOPY, BLADDER FULGERATION (N/A) BOTOX INJECTION (N/A)  Patient Location: PACU  Anesthesia Type: General  Level of Consciousness: awake, alert  and oriented  Airway & Oxygen Therapy: Patient Spontanous Breathing and Patient connected to face mask oxygen  Post-op Assessment: Report given to PACU RN and Post -op Vital signs reviewed and stable  Post vital signs: Reviewed and stable  Complications: No apparent anesthesia complications  Last Vitals:  Vitals Value Taken Time  BP 159/78 07/20/22 0845  Temp    Pulse 83 07/20/22 0847  Resp 19 07/20/22 0847  SpO2 96 % 07/20/22 0847  Vitals shown include unvalidated device data.  Last Pain:  Vitals:   07/20/22 0644  TempSrc: Oral  PainSc: 0-No pain      Patients Stated Pain Goal: 5 (16/60/60 0459)  Complications: No notable events documented.

## 2022-07-20 NOTE — Anesthesia Preprocedure Evaluation (Signed)
Anesthesia Evaluation  Patient identified by MRN, date of birth, ID band Patient awake    Reviewed: Allergy & Precautions, NPO status , Patient's Chart, lab work & pertinent test results  History of Anesthesia Complications Negative for: history of anesthetic complications  Airway Mallampati: I       Dental no notable dental hx. (+) Dental Advisory Given   Pulmonary former smoker   Pulmonary exam normal        Cardiovascular hypertension, Pt. on medications + CAD, + Past MI and + Cardiac Stents  Normal cardiovascular exam     Neuro/Psych  PSYCHIATRIC DISORDERS  Depression       GI/Hepatic Neg liver ROS,GERD  ,,  Endo/Other  diabetes, Type 2, Insulin Dependent    Renal/GU negative Renal ROS     Musculoskeletal   Abdominal Normal abdominal exam  (+)   Peds  Hematology negative hematology ROS (+)   Anesthesia Other Findings   Reproductive/Obstetrics                             Anesthesia Physical Anesthesia Plan  ASA: 3  Anesthesia Plan: MAC   Post-op Pain Management: Tylenol PO (pre-op)*   Induction:   PONV Risk Score and Plan: 2 and Ondansetron and Propofol infusion  Airway Management Planned: Natural Airway, Nasal Cannula and Simple Face Mask  Additional Equipment: None  Intra-op Plan:   Post-operative Plan:   Informed Consent: I have reviewed the patients History and Physical, chart, labs and discussed the procedure including the risks, benefits and alternatives for the proposed anesthesia with the patient or authorized representative who has indicated his/her understanding and acceptance.       Plan Discussed with: CRNA  Anesthesia Plan Comments:         Anesthesia Quick Evaluation

## 2022-07-20 NOTE — Anesthesia Procedure Notes (Signed)
Procedure Name: LMA Insertion Date/Time: 07/20/2022 8:06 AM  Performed by: Mechele Claude, CRNAPre-anesthesia Checklist: Patient identified, Emergency Drugs available, Suction available and Patient being monitored Patient Re-evaluated:Patient Re-evaluated prior to induction Oxygen Delivery Method: Circle system utilized Preoxygenation: Pre-oxygenation with 100% oxygen Induction Type: IV induction Ventilation: Mask ventilation without difficulty LMA: LMA inserted LMA Size: 4.0 Number of attempts: 1 Airway Equipment and Method: Bite block Placement Confirmation: positive ETCO2 Tube secured with: Tape Dental Injury: Teeth and Oropharynx as per pre-operative assessment

## 2022-07-20 NOTE — Discharge Instructions (Addendum)
Post Bladder Surgery Instructions   General instructions:     Your recent bladder surgery requires very little post hospital care but some definite precautions.  Despite the fact that no skin incisions were used, the area around the bladder incisions are raw and covered with scabs to promote healing and prevent bleeding. Certain precautions are needed to insure that the scabs are not disturbed over the next 2-4 weeks while the healing proceeds.  Because the raw surface inside your bladder and the irritating effects of urine you may expect frequency of urination and/or urgency (a stronger desire to urinate) and perhaps even getting up at night more often. This will usually resolve or improve slowly over the healing period. You may see some blood in your urine over the first 6 weeks. Do not be alarmed, even if the urine was clear for a while. Get off your feet and drink lots of fluids until clearing occurs. If you start to pass clots or don't improve call us.  Catheter: (If you are discharged with a catheter.)  1. Keep your catheter secured to your leg at all times with tape or the supplied strap. 2. You may experience leakage of urine around your catheter- as long as the  catheter continues to drain, this is normal.  If your catheter stops draining  go to the ER. 3. You may also have blood in your urine, even after it has been clear for  several days; you may even pass some small blood clots or other material.  This  is normal as well.  If this happens, sit down and drink plenty of water to help  make urine to flush out your bladder.  If the blood in your urine becomes worse  after doing this, contact our office or return to the ER. 4. You may use the leg bag (small bag) during the day, but use the large bag at  night.  Diet:  You may return to your normal diet immediately. Because of the raw surface of your bladder, alcohol, spicy foods, foods high in acid and drinks with caffeine may  cause irritation or frequency and should be used in moderation. To keep your urine flowing freely and avoid constipation, drink plenty of fluids during the day (8-10 glasses). Tip: Avoid cranberry juice because it is very acidic.  Activity:  Your physical activity doesn't need to be restricted. However, if you are very active, you may see some blood in the urine. We suggest that you reduce your activity under the circumstances until the bleeding has stopped.  Bowels:  It is important to keep your bowels regular during the postoperative period. Straining with bowel movements can cause bleeding. A bowel movement every other day is reasonable. Use a mild laxative if needed, such as milk of magnesia 2-3 tablespoons, or 2 Dulcolax tablets. Call if you continue to have problems. If you had been taking narcotics for pain, before, during or after your surgery, you may be constipated. Take a laxative if necessary.    Medication:  You should resume your pre-surgery medications unless told not to. In addition you may be given an antibiotic to prevent or treat infection. Antibiotics are not always necessary. All medication should be taken as prescribed until the bottles are finished unless you are having an unusual reaction to one of the drugs.  Change in urinary symptoms may not occur for 1 to 2 weeks.  If you are unable to void following Botox please call the office.  Post Anesthesia Home Care Instructions  Activity: Get plenty of rest for the remainder of the day. A responsible individual must stay with you for 24 hours following the procedure.  For the next 24 hours, DO NOT: -Drive a car -Paediatric nurse -Drink alcoholic beverages -Take any medication unless instructed by your physician -Make any legal decisions or sign important papers.  Meals: Start with liquid foods such as gelatin or soup. Progress to regular foods as tolerated. Avoid greasy, spicy, heavy foods. If nausea  and/or vomiting occur, drink only clear liquids until the nausea and/or vomiting subsides. Call your physician if vomiting continues.  Special Instructions/Symptoms: Your throat may feel dry or sore from the anesthesia or the breathing tube placed in your throat during surgery. If this causes discomfort, gargle with warm salt water. The discomfort should disappear within 24 hours.

## 2022-07-20 NOTE — Anesthesia Procedure Notes (Signed)
Procedure Name: MAC Date/Time: 07/20/2022 7:55 AM  Performed by: Mechele Claude, CRNAPre-anesthesia Checklist: Patient identified, Emergency Drugs available, Suction available and Patient being monitored Oxygen Delivery Method: Simple face mask Airway Equipment and Method: Oral airway Placement Confirmation: positive ETCO2 and breath sounds checked- equal and bilateral

## 2022-07-20 NOTE — Addendum Note (Signed)
Addendum  created 07/20/22 1024 by Mechele Claude, CRNA   Intraprocedure Event edited, Intraprocedure Staff edited

## 2022-07-21 ENCOUNTER — Encounter (HOSPITAL_BASED_OUTPATIENT_CLINIC_OR_DEPARTMENT_OTHER): Payer: Self-pay | Admitting: Urology

## 2022-08-02 ENCOUNTER — Other Ambulatory Visit (HOSPITAL_BASED_OUTPATIENT_CLINIC_OR_DEPARTMENT_OTHER): Payer: Self-pay

## 2022-08-02 DIAGNOSIS — Z23 Encounter for immunization: Secondary | ICD-10-CM | POA: Diagnosis not present

## 2022-08-02 MED ORDER — FLUAD QUADRIVALENT 0.5 ML IM PRSY
PREFILLED_SYRINGE | INTRAMUSCULAR | 0 refills | Status: DC
  Filled 2022-08-02: qty 0.5, 1d supply, fill #0

## 2022-08-02 MED ORDER — COMIRNATY 30 MCG/0.3ML IM SUSY
PREFILLED_SYRINGE | INTRAMUSCULAR | 0 refills | Status: DC
  Filled 2022-08-02: qty 0.3, 1d supply, fill #0

## 2022-08-03 DIAGNOSIS — R8271 Bacteriuria: Secondary | ICD-10-CM | POA: Diagnosis not present

## 2022-08-03 DIAGNOSIS — N3020 Other chronic cystitis without hematuria: Secondary | ICD-10-CM | POA: Diagnosis not present

## 2022-08-18 ENCOUNTER — Other Ambulatory Visit: Payer: Self-pay | Admitting: Internal Medicine

## 2022-08-18 ENCOUNTER — Ambulatory Visit
Admission: RE | Admit: 2022-08-18 | Discharge: 2022-08-18 | Disposition: A | Discharge status: Home / Self Care | Payer: Medicare Other | Source: Ambulatory Visit | Attending: Internal Medicine | Admitting: Internal Medicine

## 2022-08-18 DIAGNOSIS — N6310 Unspecified lump in the right breast, unspecified quadrant: Secondary | ICD-10-CM

## 2022-08-18 DIAGNOSIS — N6311 Unspecified lump in the right breast, upper outer quadrant: Secondary | ICD-10-CM | POA: Diagnosis not present

## 2022-08-18 DIAGNOSIS — Z853 Personal history of malignant neoplasm of breast: Secondary | ICD-10-CM | POA: Diagnosis not present

## 2022-08-18 DIAGNOSIS — R928 Other abnormal and inconclusive findings on diagnostic imaging of breast: Secondary | ICD-10-CM | POA: Diagnosis not present

## 2022-08-27 ENCOUNTER — Ambulatory Visit
Admission: RE | Admit: 2022-08-27 | Discharge: 2022-08-27 | Disposition: A | Discharge status: Home / Self Care | Payer: Medicare Other | Source: Ambulatory Visit | Attending: Internal Medicine | Admitting: Internal Medicine

## 2022-08-27 DIAGNOSIS — N6311 Unspecified lump in the right breast, upper outer quadrant: Secondary | ICD-10-CM | POA: Diagnosis not present

## 2022-08-27 DIAGNOSIS — Z170 Estrogen receptor positive status [ER+]: Secondary | ICD-10-CM | POA: Diagnosis not present

## 2022-08-27 DIAGNOSIS — N6310 Unspecified lump in the right breast, unspecified quadrant: Secondary | ICD-10-CM

## 2022-08-27 DIAGNOSIS — C50411 Malignant neoplasm of upper-outer quadrant of right female breast: Secondary | ICD-10-CM | POA: Diagnosis not present

## 2022-08-27 DIAGNOSIS — C50811 Malignant neoplasm of overlapping sites of right female breast: Secondary | ICD-10-CM | POA: Diagnosis not present

## 2022-08-27 HISTORY — PX: BREAST BIOPSY: SHX20

## 2022-09-13 ENCOUNTER — Other Ambulatory Visit: Payer: Self-pay | Admitting: General Surgery

## 2022-09-13 DIAGNOSIS — I252 Old myocardial infarction: Secondary | ICD-10-CM | POA: Insufficient documentation

## 2022-09-13 DIAGNOSIS — E119 Type 2 diabetes mellitus without complications: Secondary | ICD-10-CM | POA: Diagnosis not present

## 2022-09-13 DIAGNOSIS — Z794 Long term (current) use of insulin: Secondary | ICD-10-CM | POA: Diagnosis not present

## 2022-09-13 DIAGNOSIS — Z170 Estrogen receptor positive status [ER+]: Secondary | ICD-10-CM | POA: Diagnosis not present

## 2022-09-13 DIAGNOSIS — Z853 Personal history of malignant neoplasm of breast: Secondary | ICD-10-CM | POA: Diagnosis not present

## 2022-09-13 DIAGNOSIS — C50411 Malignant neoplasm of upper-outer quadrant of right female breast: Secondary | ICD-10-CM | POA: Diagnosis not present

## 2022-09-14 ENCOUNTER — Telehealth: Payer: Self-pay | Admitting: Cardiology

## 2022-09-14 ENCOUNTER — Other Ambulatory Visit: Payer: Medicare Other

## 2022-09-14 ENCOUNTER — Telehealth: Payer: Self-pay | Admitting: Hematology and Oncology

## 2022-09-14 DIAGNOSIS — I2510 Atherosclerotic heart disease of native coronary artery without angina pectoris: Secondary | ICD-10-CM

## 2022-09-14 DIAGNOSIS — Z01810 Encounter for preprocedural cardiovascular examination: Secondary | ICD-10-CM

## 2022-09-14 NOTE — Telephone Encounter (Signed)
Called patient to scheduled appointment. Left message for patient to call back to schedule appointment

## 2022-09-14 NOTE — Telephone Encounter (Signed)
Patient was last seen by me a year ago, has coronary artery disease and last stress test was in 2018.  Will schedule her for a exercise nuclear stress test for restratification prior to her scheduled right mastectomy under general anesthesia.  I left a message for the patient    ICD-10-CM   1. Coronary artery disease involving native coronary artery of native heart without angina pectoris  I25.10 PCV MYOCARDIAL PERFUSION WO LEXISCAN    2. Pre-operative cardiovascular examination  Z01.810 PCV MYOCARDIAL PERFUSION WO LEXISCAN     Orders Placed This Encounter  Procedures   PCV MYOCARDIAL PERFUSION WO LEXISCAN    Standing Status:   Future    Standing Expiration Date:   11/13/2022        Adrian Prows, MD, [facility] 09/14/2022, 4:18 PM Office: [phone] Fax: [phone] Pager: [phone]

## 2022-09-15 ENCOUNTER — Telehealth: Payer: Self-pay | Admitting: Hematology and Oncology

## 2022-09-15 ENCOUNTER — Inpatient Hospital Stay: Payer: Medicare Other | Attending: Hematology and Oncology | Admitting: Hematology and Oncology

## 2022-09-15 VITALS — BP 109/51 | HR 70 | Temp 97.6°F | Resp 18 | Ht 67.5 in | Wt 153.0 lb

## 2022-09-15 DIAGNOSIS — Z87891 Personal history of nicotine dependence: Secondary | ICD-10-CM | POA: Diagnosis not present

## 2022-09-15 DIAGNOSIS — Z923 Personal history of irradiation: Secondary | ICD-10-CM | POA: Diagnosis not present

## 2022-09-15 DIAGNOSIS — Z803 Family history of malignant neoplasm of breast: Secondary | ICD-10-CM | POA: Diagnosis not present

## 2022-09-15 DIAGNOSIS — C50411 Malignant neoplasm of upper-outer quadrant of right female breast: Secondary | ICD-10-CM | POA: Diagnosis not present

## 2022-09-15 DIAGNOSIS — Z955 Presence of coronary angioplasty implant and graft: Secondary | ICD-10-CM | POA: Insufficient documentation

## 2022-09-15 DIAGNOSIS — Z853 Personal history of malignant neoplasm of breast: Secondary | ICD-10-CM | POA: Diagnosis not present

## 2022-09-15 DIAGNOSIS — Z9221 Personal history of antineoplastic chemotherapy: Secondary | ICD-10-CM | POA: Diagnosis not present

## 2022-09-15 DIAGNOSIS — Z8041 Family history of malignant neoplasm of ovary: Secondary | ICD-10-CM | POA: Diagnosis not present

## 2022-09-15 DIAGNOSIS — Z170 Estrogen receptor positive status [ER+]: Secondary | ICD-10-CM | POA: Diagnosis not present

## 2022-09-15 DIAGNOSIS — G4733 Obstructive sleep apnea (adult) (pediatric): Secondary | ICD-10-CM | POA: Diagnosis not present

## 2022-09-15 DIAGNOSIS — I2510 Atherosclerotic heart disease of native coronary artery without angina pectoris: Secondary | ICD-10-CM | POA: Diagnosis not present

## 2022-09-15 DIAGNOSIS — I252 Old myocardial infarction: Secondary | ICD-10-CM | POA: Insufficient documentation

## 2022-09-15 NOTE — Telephone Encounter (Signed)
Contacted patient to scheduled appointments. Patient is aware of appointments that are scheduled.   

## 2022-09-15 NOTE — Progress Notes (Signed)
[facility] NOTE  Patient Care Team: [name], MD as PCP - General (Internal Medicine) [name], MD as Consulting Physician (Internal Medicine) Nicholas Lose, MD as Consulting Physician (Hematology and Oncology)  CHIEF COMPLAINTS/PURPOSE OF CONSULTATION:  Newly diagnosed breast cancer  HISTORY OF PRESENTING ILLNESS:  Karina Foster 82 y.o. female is here because of recent diagnosis of right breast cancer.  Patient had a screening mammogram that detected right breast mass by ultrasound measured 2.3 cm.  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  She was referred to [place] for discussion regarding adjuvant treatment options.  I reviewed her records extensively and collaborated the history with the patient.  SUMMARY OF ONCOLOGIC HISTORY: Oncology History  Malignant neoplasm of upper-outer quadrant of right breast in female, estrogen receptor positive ([place])  08/27/2022 Initial Diagnosis   Screening mammogram detected right breast mass by ultrasound measured 2.3 cm biopsy revealed grade 3 IDC ER 50%, PR 0%, Ki-67 90%, HER2 1+ negative      MEDICAL HISTORY:  Past Medical History:  Diagnosis Date   Allergic rhinitis, seasonal    Arthritis    hands   Benign essential hypertension    Coronary artery disease 12/2008   cardiologist--- dr Einar Gip;    hx NSTEMI  cath 12-16-2008  PCI and DES x2 to prox and mid LAD with other nonobstructive disease involving RCA / CFx, ef 50%;  nuclear stress test 05-18-2017 nuclear ef 47% normal perfusion and wall motion , no ischemia   Depression    Diabetes mellitus type 2, insulin dependent ([place])    followed by pcp    (06-17-2022  pt states checks several daily with Libre, fasting average 110s)   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   OAB (overactive bladder)    OSA (obstructive sleep apnea) 2016   sleep study in epic 10-04-2014, AHI 22.7/ hr;  (06-17-2022  pt stated has not used dental appliance in few years, does not fit )   Peripheral neuropathy    feet   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/2008   x2  to proximal and mid LAD   Urinary incontinence, mixed    urologist--- dr pace   Wears glasses    Wears hearing aid in both ears     SURGICAL HISTORY: Past Surgical History:  Procedure Laterality Date   BOTOX INJECTION N/A 06/22/2022   Procedure: ABORTED BOTOX INJECTION 75 UNITS;  Surgeon: Robley Fries, MD;  Location: [place];  Service: Urology;  Laterality: N/A;   BOTOX INJECTION N/A 07/20/2022   Procedure: BOTOX INJECTION;  Surgeon: Robley Fries, MD;  Location: [facility];  Service: Urology;  Laterality: N/A;   BREAST BIOPSY Right 08/27/2022  [place] RT BREAST BX W LOC DEV 1ST LESION IMG BX SPEC US GUIDE 08/27/2022 [facility]   CATARACT EXTRACTION W/ INTRAOCULAR LENS IMPLANT Bilateral 2014   CORONARY ANGIOPLASTY WITH STENT PLACEMENT  12/16/2008   '@[facility]'$   by dr berry;   PCI and stenting to proximal and mid LAD (DES) w/ residual D1 80%  nonobstructive disease involving RCA/ CFx,  ef 50%   CYSTOSCOPY N/A 06/22/2022   Procedure: CYSTOSCOPY, BLADDER BIOPSY, AND FULGERATION;  Surgeon: Robley Fries, MD;  Location: [facility];  Service: Urology;  Laterality: N/A;  30 MINS    CYSTOSCOPY N/A 07/20/2022   Procedure: CYSTOSCOPY, BLADDER FULGERATION;  Surgeon: Robley Fries, MD;  Location: [facility];  Service: Urology;  Laterality: N/A;  30 MINS   EXCISION OF BREAST BIOPSY Right 1995   MASTECTOMY PARTIAL / LUMPECTOMY W/ AXILLARY LYMPHADENECTOMY Right 1997   TUBAL LIGATION Bilateral 1977    SOCIAL HISTORY: Social History   Socioeconomic History   Marital status: Married    Spouse name: Not on file   Number of children: 1   Years of education: Assoc    Highest education level: Not on file  Occupational History    Comment: retired  Tobacco Use   Smoking status: Former    Packs/day: 1.00    Years: 20.00    Total pack years: 20.00    Types: Cigarettes    Quit date: 1991    Years since quitting: 33.0   Smokeless tobacco: Never  Vaping Use   Vaping Use: Never used  Substance and Sexual Activity   Alcohol use: Yes    Alcohol/week: 7.0 standard drinks of alcohol    Types: 7 Glasses of wine per week    Comment: 06-17-2022  per pt one glass red wine daily   Drug use: Never   Sexual activity: Not on file  Other Topics Concern   Not on file  Social History Narrative   3 caffeine drinks a day    Social Determinants of Health   Financial Resource Strain: Not on file  Food Insecurity: Not on file  Transportation Needs: Not on file  Physical Activity: Not on file  Stress: Not on file  Social Connections: Not on file  Intimate Partner Violence: Not on file    FAMILY HISTORY: Family History  Problem Relation Age of Onset   Osteoporosis Mother    Breast cancer Mother 66   Heart Problems Mother    Other Father        MI   CVA Father    Asthma Father    Diabetes Mellitus II Father    Hypertension Sister    Ovarian cancer Sister 74    ALLERGIES:  is allergic to azithromycin and demerol [meperidine].  MEDICATIONS:  Current Outpatient Medications  Medication Sig Dispense Refill   aspirin 325 MG tablet Take 325 mg by mouth  daily.     Bempedoic Acid-Ezetimibe (NEXLIZET) 180-10 MG TABS Take 1 tablet by mouth daily. (Patient taking differently: Take 1 tablet by mouth daily.) 90 tablet 3   Cholecalciferol (VITAMIN D3 PO) Take 1 capsule by mouth daily.     citalopram (CELEXA) 40 MG tablet Take 40 mg by mouth daily.     COVID-19 mRNA vaccine 2023-2024 (COMIRNATY) syringe Inject into the muscle. 0.3 mL 0   Cranberry Soft 500 MG CHEW Chew by mouth daily.     HUMALOG KWIKPEN 100 UNIT/ML KwikPen Inject 30 Units into the skin in the  morning, at noon, and at bedtime.     influenza vaccine adjuvanted (FLUAD QUADRIVALENT) 0.5 ML injection Inject into the muscle. 0.5 mL 0   metoprolol tartrate (LOPRESSOR) 25 MG tablet TAKE 1/2 TABLET TWICE DAILY (Patient taking differently: Take 12.5 mg by mouth 2 (two) times daily.) 180 tablet 3   nitroGLYCERIN (NITROSTAT) 0.4 MG SL tablet Place 0.4 mg under the tongue every 5 (five) minutes as needed for chest pain.     ramipril (ALTACE) 2.5 MG capsule Take 2.5 mg by mouth daily.     TRESIBA FLEXTOUCH 200 UNIT/ML SOPN Inject 64 Units into the skin daily after lunch.     vitamin B-12 (CYANOCOBALAMIN) 1000 MCG tablet Take 1,000 mcg by mouth daily. ER     No current facility-administered medications for this visit.    REVIEW OF SYSTEMS:   Constitutional: Denies fevers, chills or abnormal night sweats   All other systems were reviewed with the patient and are negative.  PHYSICAL EXAMINATION: ECOG PERFORMANCE STATUS: 1 - Symptomatic but completely ambulatory  Vitals:   09/15/22 1413  BP: (!) 109/51  Pulse: 70  Resp: 18  Temp: 97.6 F (36.4 C)  SpO2: 100%   Filed Weights   09/15/22 1413  Weight: 153 lb (69.4 kg)    GENERAL:alert, no distress and comfortable    LABORATORY DATA:  I have reviewed the data as listed Lab Results  Component Value Date   WBC 7.9 12/18/2008   HGB 13.6 07/20/2022   HCT 40.0 07/20/2022   MCV 76.1 (L) 12/18/2008   PLT 288 12/18/2008   Lab Results   Component Value Date   NA 138 07/20/2022   K 4.5 07/20/2022   CL 104 07/20/2022   CO2 28 12/18/2008    RADIOGRAPHIC STUDIES: I have personally reviewed the radiological reports and agreed with the findings in the report.  ASSESSMENT AND PLAN:  Malignant neoplasm of upper-outer quadrant of right breast in female, estrogen receptor positive ([place]) 08/27/2022:Screening mammogram detected right breast mass by ultrasound measured 2.3 cm biopsy revealed grade 3 IDC ER 50%, PR 0%, Ki-67 90%, HER2 1+ negative  Pathology and radiology counseling:Discussed with the patient, the details of pathology including the type of breast cancer,the clinical staging, the significance of ER, PR and HER-2/neu receptors and the implications for treatment. After reviewing the pathology in detail, we proceeded to discuss the different treatment options between surgery, radiation, chemotherapy, antiestrogen therapies.  Recommendations: 1. Breast conserving surgery followed by 2. Oncotype DX testing to determine if chemotherapy would be of any benefit followed by 3. Adjuvant radiation therapy followed by 4. Adjuvant antiestrogen therapy  Oncotype counseling: I discussed Oncotype DX test. I explained to the patient that this is a 21 gene panel to evaluate patient tumors DNA to calculate recurrence score. This would help determine whether patient has high risk or low risk breast cancer. She understands that if her tumor was found to be high risk, she would benefit from systemic chemotherapy. If low risk, no need of chemotherapy.  Return to clinic after surgery to discuss final pathology report and then determine if Oncotype DX testing will need to be sent.   All questions were answered. The patient knows to call the clinic with any problems, questions or concerns.    Harriette Ohara, MD 09/16/22

## 2022-09-15 NOTE — Progress Notes (Signed)
New Breast Cancer Diagnosis:Right Breast UOQ  Did patient present with symptoms (if so, please note symptoms) or screening mammography?:Palpable mass    Location and Extent of disease :right breast. Located at 9 o'clock position, measured 2.6 cm in greatest dimension. Adenopathy no.  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 Barry Dienes 09/13/2022 -This tumor is only weakly ER positive and so likely behaves like a triple negative tumor. I will refer her to medical oncology preoperatively to assess whether or not she will receive chemotherapy.  -Given her previous radiation and surgery with this type of tumor I do recommend a mastectomy. In reviewing her case in the multidisciplinary fashion, radiation oncology stated that there may be things they can do with this most recent diagnosis. However, she would not be able to receive full whole breast radiation. I do not think we will be able to map given her previous axillary lymph node dissection.    Medical oncologist, treatment if any:   Dr. Lindi Adie 09/15/2022 Recommendations: 1. Breast conserving surgery followed by 2. Oncotype DX testing to determine if chemotherapy would be of any benefit followed by 3. Adjuvant radiation therapy followed by 4. Adjuvant antiestrogen therapy   Family History of Breast/Ovarian/Prostate Cancer: Mother and paternal aunt had breast cancer.   Lymphedema issues, if any: No     Pain issues, if any: None.    SAFETY ISSUES: Prior radiation? Right Breast 1999, at [facility],  Pacemaker/ICD? No Possible current pregnancy? Postmenopausal Is the patient on methotrexate? No  Current Complaints / other details:   -Right Breast Lumpectomy with axillary lymph node dissection, chemotherapy, and radiation 1999.  -Lives at [facility]

## 2022-09-15 NOTE — Assessment & Plan Note (Signed)
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 After reviewing the pathology in detail, we proceeded to discuss the different treatment options between surgery, radiation, chemotherapy, antiestrogen therapies.  Recommendations: 1. Breast conserving surgery followed by 2. Oncotype DX testing to determine if chemotherapy would be of any benefit followed by 3. Adjuvant radiation therapy followed by 4. Adjuvant antiestrogen therapy  Oncotype counseling: I discussed Oncotype DX test. I explained to the patient that this is a 21 gene panel to evaluate patient tumors DNA to calculate recurrence score. This would help determine whether patient has high risk or low risk breast cancer. She understands that if her tumor was found to be high risk, she would benefit from systemic chemotherapy. If low risk, no need of chemotherapy.  Return to clinic after surgery to discuss final pathology report and then determine if Oncotype DX testing will need to be sent.

## 2022-09-16 ENCOUNTER — Telehealth: Payer: Self-pay | Admitting: *Deleted

## 2022-09-16 ENCOUNTER — Encounter: Payer: Self-pay | Admitting: *Deleted

## 2022-09-16 NOTE — Progress Notes (Addendum)
Radiation Oncology         [phone] ________________________________  Name: Karina Foster        MRN: [id number]  Date of Service: 09/17/2022 DOB: 04/08/1941  CB:JSEG, Ravisankar, MD  Stark Klein, MD     REFERRING PHYSICIAN: Stark Klein, MD   DIAGNOSIS: The encounter diagnosis was Malignant neoplasm of upper-outer quadrant of right breast in female, estrogen receptor positive ([place]).     ---------------------------------------------------------------------------------------------------------------------------------- Addendum Please see the note from Dene Gentry, PA-C from today's visit for more details of today's encounter.  I have personally performed a face to face diagnostic evaluation on this patient and devised the following assessment and plan.  The patient was seen today in clinic preoperatively for her diagnosis of breast cancer:  a grade 3 IDC of the right breast. The patient is going to further discuss breast conservation treatment with Dr. Barry Dienes. Notably, she had right-sided breast cancer in 1997, s/p adjuvant XRT. Given the long timeframe since that treatment, I believe that she likely would do OK with adjuvant XRT if she is highly motivated for breast preservation.  We discussed the potential benefit of treatment as well as possible side effects and risks. All of the patient's questions were answered. I look forward to seeing the patient at the appropriate time postoperatively to further coordinate her care.   ------------------------------------------------  Jodelle Gross, MD, PhD  ----------------------------------------------------------------------------------------------------------------------------------       HISTORY OF PRESENT ILLNESS: Karina Foster is a 82 y.o. female seen at the request of Dr. Barry Dienes for a diagnosis of right breast cancer. The patient had a palpable mass and subsequently underwent diagnostic imaging. Diagnostic mammogram on 08/18/22  confirmed a highly suspicious right breast mass in the 9:00 position. Ultrasound that same day measured a 2.6 cm right breast mass with overyling skin thickening. No abnormal nodes were visualized. 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 Ki-67 was 90%.   Of note, patient has a history of a right breast lumpectomy with axillary lymph node dissection, chemotherapy, and radiation 25 years ago.  Patient met with Dr. Barry Dienes on 09/13/22 to discuss surgical options. She recommended a mastectomy given her tumor type and history of lumpectomy and radiation to the right breast.  Dr. Lindi Adie met with the patient on 09/15/22. They discussed lumpectomy followed by radiation therapy as another treatment option. He also recommended oncotype testing to determine if chemotherapy would be of any benefit along with antiestrogen therapy following surgery.   She is seen today to discuss radiotherapy's role in the treatment of her breast cancer.  PREVIOUS RADIATION THERAPY: Yes   Radiation to the right breast for breast cancer in 1997. Patient does not remember how many treatments she received.    PAST MEDICAL HISTORY:  Past Medical History:  Diagnosis Date   Allergic rhinitis, seasonal    Arthritis    hands   Benign essential hypertension    Coronary artery disease 12/2008   cardiologist--- dr Einar Gip;    hx NSTEMI  cath 12-16-2008  PCI and DES x2 to prox and mid LAD with other nonobstructive disease involving RCA / CFx, ef 50%;  nuclear stress test 05-18-2017 nuclear ef 47% normal perfusion and wall motion , no ischemia   Depression    Diabetes mellitus type 2, insulin dependent ([place])    followed by pcp    (06-17-2022  pt states checks several daily with Libre, fasting average 110s)  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    GERD (gastroesophageal reflux disease)    History of benign neoplasm of tongue    per pt has had several bx's of lesions    History of cancer chemotherapy 1997   right breast cancer   History of external beam radiation therapy 1997   right breast cancer   History of iron deficiency anemia    History of non-ST elevation myocardial infarction (NSTEMI) 12/14/2008   History of right breast cancer 1997   s/p  right partial masectomy node dissection's,  completed chemo and radiation same year ,   no recurrence since   Hyperlipidemia    Mood disorder ([place])    Neuropathy due to chemotherapeutic drug ([place])    hands   OAB (overactive bladder)    OSA (obstructive sleep apnea) 2016   sleep study in epic 10-04-2014, AHI 22.7/ hr;  (06-17-2022  pt stated has not used dental appliance in few years, does not fit )   Peripheral neuropathy    feet   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/2008   x2  to proximal and mid LAD   Urinary incontinence, mixed    urologist--- dr pace   Wears glasses    Wears hearing aid in both ears        PAST SURGICAL HISTORY: Past Surgical History:  Procedure Laterality Date   BOTOX INJECTION N/A 06/22/2022   Procedure: ABORTED BOTOX INJECTION 75 UNITS;  Surgeon: Robley Fries, MD;  Location: [place];  Service: Urology;  Laterality: N/A;   BOTOX INJECTION N/A 07/20/2022   Procedure: BOTOX INJECTION;  Surgeon: Robley Fries, MD;  Location: [facility];  Service: Urology;  Laterality: N/A;   BREAST BIOPSY Right 08/27/2022   [place] RT BREAST BX W LOC DEV 1ST LESION IMG BX SPEC US GUIDE 08/27/2022 [facility] MAMMO[facility]HY   CATARACT EXTRACTION W/ INTRAOCULAR LENS IMPLANT Bilateral 2014   CORONARY ANGIOPLASTY WITH STENT PLACEMENT  12/16/2008   '@[facility]'$   by dr berry;   PCI and stenting to proximal and mid LAD (DES) w/ residual D1 80%  nonobstructive disease involving RCA/ CFx,  ef 50%   CYSTOSCOPY N/A 06/22/2022   Procedure: CYSTOSCOPY, BLADDER BIOPSY, AND FULGERATION;  Surgeon: Robley Fries, MD;  Location: [place];  Service: Urology;   Laterality: N/A;  30 MINS   CYSTOSCOPY N/A 07/20/2022   Procedure: CYSTOSCOPY, BLADDER FULGERATION;  Surgeon: Robley Fries, MD;  Location: [facility];  Service: Urology;  Laterality: N/A;  30 MINS   EXCISION OF BREAST BIOPSY Right 1995   MASTECTOMY PARTIAL / LUMPECTOMY W/ AXILLARY LYMPHADENECTOMY Right 1997   TUBAL LIGATION Bilateral 1977     FAMILY HISTORY:  Family History  Problem Relation Age of Onset   Osteoporosis Mother    Breast cancer Mother 77   Heart Problems Mother    Other Father        MI   CVA Father    Asthma Father    Diabetes Mellitus II Father    Hypertension Sister    Ovarian cancer Sister 51     SOCIAL HISTORY:  reports that she quit smoking about 33 years ago. Her smoking use included cigarettes. She has a 20.00 pack-year smoking history. She has never used smokeless tobacco. She reports current alcohol use of about 7.0 standard drinks of alcohol per week. She reports that she does not use drugs.   ALLERGIES: Azithromycin and Demerol [meperidine]  MEDICATIONS:  Current Outpatient Medications  Medication Sig Dispense Refill   aspirin 325 MG tablet Take 325 mg by mouth daily.     Bempedoic Acid-Ezetimibe (NEXLIZET) 180-10 MG TABS Take 1 tablet by mouth daily. (Patient taking differently: Take 1 tablet by mouth daily.) 90 tablet 3   Cholecalciferol (VITAMIN D3 PO) Take 1 capsule by mouth daily.     citalopram (CELEXA) 40 MG tablet Take 40 mg by mouth daily.     COVID-19 mRNA vaccine 2023-2024 (COMIRNATY) syringe Inject into the muscle. 0.3 mL 0   Cranberry Soft 500 MG CHEW Chew by mouth daily.     HUMALOG KWIKPEN 100 UNIT/ML KwikPen Inject 30 Units into the skin in the morning, at noon, and at bedtime.     influenza vaccine adjuvanted (FLUAD QUADRIVALENT) 0.5 ML injection Inject into the muscle. 0.5 mL 0   metoprolol tartrate (LOPRESSOR) 25 MG tablet TAKE 1/2 TABLET TWICE DAILY (Patient taking differently: Take 12.5 mg by mouth 2 (two)  times daily.) 180 tablet 3   nitroGLYCERIN (NITROSTAT) 0.4 MG SL tablet Place 0.4 mg under the tongue every 5 (five) minutes as needed for chest pain.     ramipril (ALTACE) 2.5 MG capsule Take 2.5 mg by mouth daily.     TRESIBA FLEXTOUCH 200 UNIT/ML SOPN Inject 64 Units into the skin daily after lunch.     vitamin B-12 (CYANOCOBALAMIN) 1000 MCG tablet Take 1,000 mcg by mouth daily. ER     No current facility-administered medications for this encounter.     REVIEW OF SYSTEMS: On review of systems, the patient reports that she is doing well overall. She is experiencing minor breast pain from the biopsy. No breast specific complaints are verbalized.        PHYSICAL EXAM:  Wt Readings from Last 3 Encounters:  09/17/22 159 lb 6 oz (72.3 kg)  09/15/22 153 lb (69.4 kg)  07/20/22 159 lb 9.6 oz (72.4 kg)   Temp Readings from Last 3 Encounters:  09/17/22 (!) 96.8 F (36 C) (Temporal)  09/15/22 97.6 F (36.4 C) (Tympanic)  07/20/22 (!) 97.5 F (36.4 C)   BP Readings from Last 3 Encounters:  09/17/22 (!) 143/67  09/15/22 (!) 109/51  07/20/22 (!) 148/81   Pulse Readings from Last 3 Encounters:  09/17/22 92  09/15/22 70  07/20/22 79    In general this is a well appearing female in no acute distress. She's alert and oriented x4 and appropriate throughout the examination. Cardiopulmonary assessment is negative for acute distress and she exhibits normal effort. Bilateral breast exam is deferred.    ECOG = 1  0 - Asymptomatic (Fully active, able to carry on all predisease activities without restriction)  1 - Symptomatic but completely ambulatory (Restricted in physically strenuous activity but ambulatory and able to carry out work of a light or sedentary nature. For example, light housework, office work)  2 - Symptomatic, <50% in bed during the day (Ambulatory and capable of all self care but unable to carry out any work activities. Up and about more than 50% of waking hours)  3 -  Symptomatic, >50% in bed, but not bedbound (Capable of only limited self-care, confined to bed or chair 50% or more of waking hours)  4 - Bedbound (Completely disabled. Cannot carry on any self-care. Totally confined to bed or chair)  5 - Death   Eustace Pen MM, Creech RH, Tormey DC, et al. [phone]). "Toxicity and response criteria of the [facility]". Am. Lenna Sciara.  Clin. Oncol. 5 (6): 649-55    LABORATORY DATA:  Lab Results  Component Value Date   WBC 7.9 12/18/2008   HGB 13.6 07/20/2022   HCT 40.0 07/20/2022   MCV 76.1 (L) 12/18/2008   PLT 288 12/18/2008   Lab Results  Component Value Date   NA 138 07/20/2022   K 4.5 07/20/2022   CL 104 07/20/2022   CO2 28 12/18/2008   No results found for: "ALT", "AST", "GGT", "ALKPHOS", "BILITOT"    RADIOGRAPHY: [place] RT BREAST BX W LOC DEV 1ST LESION IMG BX SPEC US GUIDE  Addendum Date: 09/07/2022   ADDENDUM REPORT: 09/07/2022 08:02 ADDENDUM: Pathology revealed GRADE 3 INVASIVE DUCTAL CARCINOMA of the RIGHT breast, 9:00 o'clock, 2 cmfn (ribbon clip). This was found to be concordant by Dr. Kristopher Oppenheim. Pathology results were discussed with the patient by telephone. The patient reported doing well after the biopsy with tenderness at the site. Post biopsy instructions and care were reviewed and questions were answered. The patient was encouraged to call [facility] for any additional concerns. Surgical consultation has been arranged with Dr. Stark Klein at [facility] on September 10, 2022. Pathology results reported by Stacie Acres RN on 09/01/2022. Electronically Signed   By: Kristopher Oppenheim M.D.   On: 09/07/2022 08:02   Result Date: 09/07/2022 CLINICAL DATA:  82 year old female with a suspicious right breast mass. EXAM: ULTRASOUND GUIDED RIGHT BREAST CORE NEEDLE BIOPSY COMPARISON:  Previous exam(s). PROCEDURE: I met with the patient and we discussed the procedure of ultrasound-guided biopsy, including  benefits and alternatives. We discussed the high likelihood of a successful procedure. We discussed the risks of the procedure, including infection, bleeding, tissue injury, clip migration, and inadequate sampling. Informed written consent was given. The usual time-out protocol was performed immediately prior to the procedure. Lesion quadrant: Upper outer quadrant Using sterile technique and 1% Lidocaine as local anesthetic, under direct ultrasound visualization, a 14 gauge spring-loaded device was used to perform biopsy of a mass at the 9 o'clock position using a lateral approach. At the conclusion of the procedure a ribbon shaped tissue marker clip was deployed into the biopsy cavity. Follow up 2 view mammogram was performed and dictated separately. IMPRESSION: Ultrasound guided biopsy of the right breast. No apparent complications. Electronically Signed: By: Kristopher Oppenheim M.D. On: 08/27/2022 12:01  MM CLIP PLACEMENT RIGHT  Result Date: 08/27/2022 CLINICAL DATA:  Status post right breast biopsy. EXAM: 3D DIAGNOSTIC RIGHT MAMMOGRAM POST ULTRASOUND BIOPSY COMPARISON:  Previous exam(s). FINDINGS: 3D Mammographic images were obtained following ultrasound guided biopsy of the lateral subareolar right breast. The biopsy marking clip is in expected position at the site of biopsy. IMPRESSION: Appropriate positioning of the ribbon shaped biopsy marking clip at the site of biopsy in the right breast. Final Assessment: Post Procedure Mammograms for Marker Placement Electronically Signed   By: Kristopher Oppenheim M.D.   On: 08/27/2022 13:01  US BREAST LTD UNI RIGHT INC AXILLA  Result Date: 08/18/2022 CLINICAL DATA:  Patient presents with a palpable firm right breast mass. Remote history of a right lumpectomy for breast carcinoma and right axillary node dissection, proximally 25 years ago. No mammograms the last 20 years. EXAM: DIGITAL DIAGNOSTIC BILATERAL MAMMOGRAM WITH TOMOSYNTHESIS; ULTRASOUND RIGHT BREAST LIMITED  TECHNIQUE: Bilateral digital diagnostic mammography and breast tomosynthesis was performed.; Targeted ultrasound examination of the right breast was performed COMPARISON:  None available. ACR Breast Density Category b: There are scattered areas of fibroglandular density. FINDINGS:  Partly obscured mass in the upper outer right breast, measuring approximately 1.7 cm in size, associated with architectural distortion. This corresponds to the palpable mass. No other masses or other areas of architectural distortion. Bilateral diffuse punctate dystrophic appearing calcifications. No suspicious calcifications. On physical exam, firm mass in right breast, upper outer quadrant, near the nipple, associated skin thickening. Targeted right breast ultrasound is performed, showing a hypoechoic mass with partly irregular and ill-defined margins at 9 o'clock, 2 cm the nipple, measuring 2.6 x 1.4 x 2.4 cm. A portion of the mass connects with the overlying thickened skin. Right axillary ultrasound shows no enlarged or abnormal nodes. No normal nodes are visualized. IMPRESSION: 1. Highly suspicious, 2.6 mass in the right breast with overlying skin thickening. RECOMMENDATION: 1. Ultrasound-guided core needle biopsy of the right breast mass. This procedure was scheduled prior to the patient being discharged from the [facility]. I have discussed the findings and recommendations with the patient. If applicable, a reminder letter will be sent to the patient regarding the next appointment. BI-RADS CATEGORY  5: Highly suggestive of malignancy. Electronically Signed   By: Lajean Manes M.D.   On: 08/18/2022 14:10      IMPRESSION/PLAN: 1. Grade 3 Invasive Ductal Carcinoma ER+, PR-, HER2 - of the right breast Dr. Lisbeth Renshaw discussed the pathology findings and reviewed the nature of invasive ductal carcinoma. Of note, patient was treated with radiation therapy for a right breast cancer in 1997. This is an adequate amount of time for complete  healing and patient is a candidate for additional radiation at this time. Patient is still deciding between mastectomy vs lumpectomy. If she were to undergo a lumpectomy, Dr. Lisbeth Renshaw recommends external beam radiotherapy to the breast following her lumpectomy to reduce risks of local recurrence followed by antiestrogen therapy. If she were to receive chemotherapy, patient understands that this would precede radiation therapy.  We discussed the risks, benefits, short, and long term effects of radiotherapy, as well as the curative intent. Dr. Lisbeth Renshaw discussed the delivery and logistics of radiotherapy and anticipates a course of 4 weeks of radiotherapy to the right breast. We will await her decision and plan to see her back a few weeks after her surgery if she decides to proceed with a lumpectomy. We anticipate starting radiotherapy about 4-6 weeks after surgery.    In a visit lasting 60 minutes, greater than 50% of the time was spent face to face reviewing her case, as well as in preparation of, discussing, and coordinating the patient's care.  The above documentation reflects my direct findings during this shared patient visit. Please see the separate note by Dr. Lisbeth Renshaw on this date for the remainder of the patient's plan of care.    Leona Singleton, [place]    **Disclaimer: This note was dictated with voice recognition software. Similar sounding words can inadvertently be transcribed and this note may contain transcription errors which may not have been corrected upon publication of note.**

## 2022-09-16 NOTE — Telephone Encounter (Signed)
Received order for oncotype testing on CORE bx. Requisition sent to [facility]

## 2022-09-16 NOTE — Telephone Encounter (Signed)
Called pt to provide navigation resources and contact information. Left vm with contact information for questions or needs.

## 2022-09-17 ENCOUNTER — Ambulatory Visit
Admission: RE | Admit: 2022-09-17 | Discharge: 2022-09-17 | Disposition: A | Discharge status: Home / Self Care | Payer: Medicare Other | Source: Ambulatory Visit | Attending: Radiation Oncology | Admitting: Radiation Oncology

## 2022-09-17 ENCOUNTER — Encounter: Payer: Self-pay | Admitting: Radiation Oncology

## 2022-09-17 VITALS — BP 143/67 | HR 92 | Temp 96.8°F | Resp 18 | Ht 67.5 in | Wt 159.4 lb

## 2022-09-17 DIAGNOSIS — E1142 Type 2 diabetes mellitus with diabetic polyneuropathy: Secondary | ICD-10-CM | POA: Insufficient documentation

## 2022-09-17 DIAGNOSIS — K219 Gastro-esophageal reflux disease without esophagitis: Secondary | ICD-10-CM | POA: Insufficient documentation

## 2022-09-17 DIAGNOSIS — Z87891 Personal history of nicotine dependence: Secondary | ICD-10-CM | POA: Diagnosis not present

## 2022-09-17 DIAGNOSIS — C50411 Malignant neoplasm of upper-outer quadrant of right female breast: Secondary | ICD-10-CM | POA: Insufficient documentation

## 2022-09-17 DIAGNOSIS — Z9221 Personal history of antineoplastic chemotherapy: Secondary | ICD-10-CM | POA: Insufficient documentation

## 2022-09-17 DIAGNOSIS — Z803 Family history of malignant neoplasm of breast: Secondary | ICD-10-CM | POA: Diagnosis not present

## 2022-09-17 DIAGNOSIS — I2510 Atherosclerotic heart disease of native coronary artery without angina pectoris: Secondary | ICD-10-CM | POA: Insufficient documentation

## 2022-09-17 DIAGNOSIS — Z79899 Other long term (current) drug therapy: Secondary | ICD-10-CM | POA: Diagnosis not present

## 2022-09-17 DIAGNOSIS — Z170 Estrogen receptor positive status [ER+]: Secondary | ICD-10-CM | POA: Diagnosis not present

## 2022-09-17 DIAGNOSIS — G4733 Obstructive sleep apnea (adult) (pediatric): Secondary | ICD-10-CM | POA: Insufficient documentation

## 2022-09-17 DIAGNOSIS — I10 Essential (primary) hypertension: Secondary | ICD-10-CM | POA: Diagnosis not present

## 2022-09-17 DIAGNOSIS — Z7982 Long term (current) use of aspirin: Secondary | ICD-10-CM | POA: Diagnosis not present

## 2022-09-17 DIAGNOSIS — E119 Type 2 diabetes mellitus without complications: Secondary | ICD-10-CM | POA: Insufficient documentation

## 2022-09-17 DIAGNOSIS — E785 Hyperlipidemia, unspecified: Secondary | ICD-10-CM | POA: Insufficient documentation

## 2022-09-21 ENCOUNTER — Ambulatory Visit: Payer: Medicare Other

## 2022-09-21 DIAGNOSIS — Z01810 Encounter for preprocedural cardiovascular examination: Secondary | ICD-10-CM | POA: Diagnosis not present

## 2022-09-21 DIAGNOSIS — I2510 Atherosclerotic heart disease of native coronary artery without angina pectoris: Secondary | ICD-10-CM | POA: Diagnosis not present

## 2022-09-23 ENCOUNTER — Ambulatory Visit: Payer: Medicare Other | Admitting: Cardiology

## 2022-09-23 ENCOUNTER — Encounter: Payer: Self-pay | Admitting: Cardiology

## 2022-09-23 VITALS — BP 114/63 | HR 85 | Ht 67.5 in | Wt 160.2 lb

## 2022-09-23 DIAGNOSIS — E7800 Pure hypercholesterolemia, unspecified: Secondary | ICD-10-CM | POA: Diagnosis not present

## 2022-09-23 DIAGNOSIS — T466X5A Adverse effect of antihyperlipidemic and antiarteriosclerotic drugs, initial encounter: Secondary | ICD-10-CM | POA: Diagnosis not present

## 2022-09-23 DIAGNOSIS — Z01810 Encounter for preprocedural cardiovascular examination: Secondary | ICD-10-CM | POA: Diagnosis not present

## 2022-09-23 DIAGNOSIS — I2510 Atherosclerotic heart disease of native coronary artery without angina pectoris: Secondary | ICD-10-CM | POA: Diagnosis not present

## 2022-09-23 DIAGNOSIS — I10 Essential (primary) hypertension: Secondary | ICD-10-CM

## 2022-09-23 DIAGNOSIS — G720 Drug-induced myopathy: Secondary | ICD-10-CM | POA: Diagnosis not present

## 2022-09-23 NOTE — Progress Notes (Signed)
Primary Physician/Referring:  [name], MD  Patient ID: Karina Foster, female    DOB: 09-10-1940, 82 y.o.   MRN: [id number]  Chief Complaint  Patient presents with   Medical Clearance    Breast surgery   HPI:    Karina Foster  is a 82 y.o.   Caucasian female with hypertension, hyperlipidemia, uncontrolled diabetes mellitus and coronary artery disease and angioplasty and stenting to the proximal and mid LAD in 2010 has a residual 80% large D1 stenosis, statin intolerant and presently on Nexlizet.  She has been diagnosed with recurrence of breast cancer, and has been scheduled for surgery.  She underwent nuclear stress test and presents for follow-up.  She denies CP, dyspnea, orthopnea, PND, or symptoms suggestive of TIA or claudication.  Except for urinary frequency and incontinence she has no specific complaints today.  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    GERD (gastroesophageal reflux disease)    History of benign neoplasm of tongue    per pt has had several bx's of lesions   History of cancer chemotherapy 1997   right breast cancer   History of external beam radiation therapy 1997   right breast cancer   History of iron deficiency anemia    History of non-ST elevation myocardial infarction (NSTEMI) 12/14/2008   History of right breast cancer 1997   s/p  right partial masectomy node dissection's,   completed chemo and radiation same year ,   no recurrence since   Hyperlipidemia    Mood disorder ([place])    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   OAB (overactive bladder)    OSA (obstructive sleep apnea) 2016   sleep study in epic 10-04-2014, AHI 22.7/ hr;  (06-17-2022  pt stated has not used dental appliance in few years, does not fit )   Peripheral neuropathy    feet   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/2008   x2  to proximal and mid LAD   Urinary incontinence, mixed    urologist--- dr pace   Wears glasses    Wears hearing aid in both ears    Past Surgical History:  Procedure Laterality Date   BOTOX INJECTION N/A 06/22/2022   Procedure: ABORTED BOTOX INJECTION 75 UNITS;  Surgeon: Robley Fries, MD;  Location: [facility]  Service: Urology;  Laterality: N/A;   BOTOX INJECTION N/A 07/20/2022   Procedure: BOTOX INJECTION;  Surgeon: Robley Fries, MD;  Location: [facility];  Service: Urology;  Laterality: N/A;   BREAST BIOPSY Right 08/27/2022   [place] RT BREAST BX W LOC DEV 1ST LESION IMG BX SPEC US GUIDE 08/27/2022 [facility]   CATARACT EXTRACTION W/ INTRAOCULAR LENS IMPLANT Bilateral 2014   CORONARY ANGIOPLASTY WITH STENT PLACEMENT  12/16/2008   '@[facility]'$   by dr berry;   PCI and stenting to proximal and mid LAD (DES) w/ residual D1 80%  nonobstructive disease involving RCA/ CFx,  ef 50%   CYSTOSCOPY N/A 06/22/2022   Procedure: CYSTOSCOPY, BLADDER BIOPSY, AND FULGERATION;  Surgeon: Robley Fries, MD;  Location: [facility];  Service: Urology;  Laterality: N/A;  30 MINS   CYSTOSCOPY N/A 07/20/2022   Procedure: CYSTOSCOPY, BLADDER FULGERATION;  Surgeon: Robley Fries, MD;  Location: [facility];  Service: Urology;  Laterality: N/A;  30 [place] Right 1995   MASTECTOMY PARTIAL / LUMPECTOMY W/ AXILLARY LYMPHADENECTOMY Right 1997   TUBAL LIGATION Bilateral  1977   Social History   Tobacco Use   Smoking status: Former    Packs/day: 1.00    Years: 20.00    Total pack years: 20.00    Types: Cigarettes    Quit date: 1991    Years since quitting: 33.0   Smokeless tobacco: Never  Substance Use Topics   Alcohol use: Yes    Alcohol/week: 7.0 standard drinks of alcohol    Types: 7 Glasses of wine per week    Comment: 06-17-2022  per pt one glass red wine daily  Marital Status: Married    ROS  Review of Systems  Cardiovascular:  Negative for dyspnea on exertion and leg swelling.  Gastrointestinal:  Negative for melena.  Genitourinary:  Positive for bladder incontinence and frequency.   Objective  Blood pressure 114/63, pulse 85, height 5' 7.5" (1.715 m), weight 160 lb 3.2 oz (72.7 kg), SpO2 97 %.     09/23/2022   11:06 AM 09/17/2022   10:40 AM 09/15/2022    2:13 PM  Vitals with BMI  Height 5' 7.5" 5' 7.5" 5' 7.5"  Weight 160 lbs 3 oz 159 lbs 6 oz 153 lbs  BMI 24.71 37.48 27.0  Systolic 786 754 492  Diastolic 63 67 51  Pulse 85 92 70    Physical Exam Neck:     Vascular: No carotid bruit or JVD.  Cardiovascular:     Rate and Rhythm: Normal rate and regular rhythm.     Pulses: Intact distal pulses.     Heart sounds: Normal heart sounds. No murmur heard.    No gallop.  Pulmonary:     Effort: Pulmonary effort is normal.     Breath sounds: Normal breath sounds.  Abdominal:     General: Bowel sounds are normal.     Palpations: Abdomen is soft.  Musculoskeletal:     Right lower leg: No edema.     Left lower leg: No edema.    Laboratory examination:   Lab Results  Component Value Date   NA 138 07/20/2022   K 4.5 07/20/2022   CO2 28 12/18/2008   GLUCOSE 168 (H) 07/20/2022   BUN 20 07/20/2022   CREATININE 0.90 07/20/2022   CALCIUM 8.9 12/18/2008   GFRNONAA >60 12/18/2008    Lab Results  Component Value Date   WBC 7.9 12/18/2008   HGB 13.6 07/20/2022   HCT 40.0 07/20/2022   MCV 76.1 (L) 12/18/2008   PLT 288  12/18/2008   External labs:  Labs 02/23/2022:  A1c 6.1%.  TSH mildly elevated at 4.64.  Total cholesterol 258, triglycerides 123, HDL 88, LDL 145.  Non-HDL cholesterol 170.  Apolipoprotein B mildly elevated at 104 (49-90 mg/dL).   Medications and allergies   Allergies  Allergen Reactions   Azithromycin Rash   Demerol [  Meperidine] Diarrhea and Nausea And Vomiting     Current Outpatient Medications:    aspirin 325 MG tablet, Take 325 mg by mouth daily., Disp: , Rfl:    Bempedoic Acid-Ezetimibe (NEXLIZET) 180-10 MG TABS, Take 1 tablet by mouth daily. (Patient taking differently: Take 1 tablet by mouth daily.), Disp: 90 tablet, Rfl: 3   Cholecalciferol (VITAMIN D3 PO), Take 1 capsule by mouth daily., Disp: , Rfl:    citalopram (CELEXA) 40 MG tablet, Take 40 mg by mouth daily., Disp: , Rfl:    Continuous Blood Gluc Sensor (FREESTYLE LIBRE 14 DAY SENSOR) MISC, Apply topically every 14 (fourteen) days., Disp: , Rfl:    Cranberry Soft 500 MG CHEW, Chew by mouth daily., Disp: , Rfl:    HUMALOG KWIKPEN 100 UNIT/ML KwikPen, Inject 30 Units into the skin in the morning, at noon, and at bedtime., Disp: , Rfl:    metoprolol tartrate (LOPRESSOR) 25 MG tablet, TAKE 1/2 TABLET TWICE DAILY (Patient taking differently: Take 12.5 mg by mouth 2 (two) times daily.), Disp: 180 tablet, Rfl: 3   nitroGLYCERIN (NITROSTAT) 0.4 MG SL tablet, Place 0.4 mg under the tongue every 5 (five) minutes as needed for chest pain., Disp: , Rfl:    ramipril (ALTACE) 2.5 MG capsule, Take 2.5 mg by mouth daily., Disp: , Rfl:    TRESIBA FLEXTOUCH 200 UNIT/ML SOPN, Inject 64 Units into the skin daily after lunch., Disp: , Rfl:    vitamin B-12 (CYANOCOBALAMIN) 1000 MCG tablet, Take 1,000 mcg by mouth daily. ER, Disp: , Rfl:     Radiology:  No results found.  Cardiac Studies:   Coronary angiogram 12/16/2008: Proximal LAD stenting with 2.25 x 16 mm Taxus DES in mid LAD and 3.0 x 12 Xience drug-eluting stent in proximal LAD.   Residual 80% in large diagonal 1. 40% mid RCA, 30% mid circumflex and   EF 50%.  Lexiscan myoview stress test 05/23/2017: 1. Pharmacologic stress testing was performed with intravenous administration of .4 mg of Lexiscan over a 10-15 seconds infusion. Stress symptoms included dyspnea, dizziness. 2. Stress EKG is non diagnostic for ischemia as it is a pharmacologic stress.  3. The overall quality of the study is fair.  Left ventricular cavity is noted to be normal on the rest and stress studies.  The left ventricular ejection fraction was calculated or visually estimated to be 47%.  SPECT images demonstrate small perfusion abnormality of mild intensity in the basal anterior myocardial wall(s) on the stress and rest images. Perfusion defect improves on stress images, most likely representing attenuation artifact.  4. Given ejection fraction <50% in a diabetic patient, this is an intermediate risk study.  Echocardiogram 06/16/2017: Left ventricle cavity is normal in size. Mild concentric hypertrophy of the left ventricle. Mild decrease in global wall motion. Visual EF is 50-55%. Indeterminate diastolic function. Moderate (Grade II) aortic regurgitation. Mild aortic valve leaflet calcification. Mild interval increase in aortic regurgitation compared to echocardiogram in 2015.  Mild (Grade I) mitral regurgitation. Mildly restricted mitral valve leaflets. Mild tricuspid regurgitation. No evidence of pulmonary hypertension.  PCV MYOCARDIAL PERFUSION WO LEXISCAN 09/21/2022  Narrative Exercise Myoview stress test 09/21/2021: Exercise nuclear stress test was performed using Bruce protocol. Exercise time 3 minutes 33 seconds on Bruce protocol, achieved 5.15 METS, 88 % of APMHR. Hypertensive response to exercise: No (rest 150/72, peak 180/80) Stress ECG negative for ischemia. Small size, mild intensity, reversible perfusion defect involving the apical lateral segment, cannot entirely rule out mild ischemia  in either  the distal RCA/LCx distribution.  Remainder of the myocardial segments illustrate homogenous tracer uptake without evidence of prior infarct. Calculated LVEF 67%, wall thickness and wall motion preserved. Prior Lexiscan dated 05/23/2017 reported attenuation artifact in the anterior wall with calculated LVEF 47%. Low risk study.      EKG:  EKG 09/23/2022: Normal sinus rhythm at rate of 85 bpm, left atrial enlargement, normal axis.  LVH.  No significant change from 10/23/2020.  Assessment     ICD-10-CM   1. Pre-operative cardiovascular examination Right mastectomy  Z01.810 EKG 12-Lead    2. Coronary artery disease involving native coronary artery of native heart without angina pectoris  I25.10     3. Hypercholesteremia  E78.00 Lipid Panel With LDL/HDL Ratio    Lipid Panel With LDL/HDL Ratio    4. 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  Medications Discontinued During This Encounter  Medication Reason   COVID-19 mRNA vaccine 2023-2024 (COMIRNATY) syringe Patient Preference   influenza vaccine adjuvanted (FLUAD QUADRIVALENT) 0.5 ML injection Patient Preference    Orders Placed This Encounter  Procedures   Lipid Panel With LDL/HDL Ratio    Standing Status:   Future    Number of Occurrences:   1    Standing Expiration Date:   09/24/2023   EKG 12-Lead     Recommendations:    Karina Foster  is a  82 y.o.   Caucasian female with hypertension, hyperlipidemia, uncontrolled diabetes mellitus and coronary artery disease and angioplasty and stenting to the proximal and mid LAD in 2010 has a residual 80% large D1 stenosis, statin intolerant and presently on Nexlizet.  1. Pre-operative cardiovascular examination Right mastectomy Patient has had recurrence of breast cancer on the right, now scheduled for either mastectomy or lumpectomy, surgical preference was mastectomy and patient  prefers probably lumpectomy but she will think about this.  I reviewed the nuclear stress test, low risk.  She can be taken up for the upcoming surgery with low risk.  I will send surgical risk letter to Dr. Stark Klein.   2. Coronary artery disease involving native coronary artery of native heart without angina pectoris Patient is without recurrence of angina pectoris, presently doing well and no clinical evidence of heart failure.  3. Hypercholesteremia Patient is on Nexlizet, in spite of this lipids are not at goal.  In view of underlying coronary artery disease, I will start her on Leqvio, we will start doing prior authorization.  4. Primary hypertension Blood pressure is well-controlled, renal function is normal, external labs reviewed.  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   As I will be starting her on Leqvio, will obtain lipid profile testing in 2 to 3 months and I would like to see her back then.    Adrian Prows, MD, [facility] 09/23/2022, 11:59 AM Office: [phone] Pager: [phone]

## 2022-09-24 ENCOUNTER — Telehealth: Payer: Self-pay | Admitting: Licensed Clinical Social Worker

## 2022-09-24 NOTE — Telephone Encounter (Signed)
[place] Work  Clinical Social Work was referred by new patient protocol for assessment of psychosocial needs.  Clinical Social Worker attempted to contact patient by phone  to offer support and assess for needs.   No answer. Left VM with direct contact information.     [place], [place] Worker [employer]

## 2022-09-29 ENCOUNTER — Other Ambulatory Visit: Payer: Self-pay | Admitting: General Surgery

## 2022-10-01 ENCOUNTER — Telehealth: Payer: Self-pay | Admitting: *Deleted

## 2022-10-01 ENCOUNTER — Telehealth: Payer: Self-pay | Admitting: Hematology and Oncology

## 2022-10-01 ENCOUNTER — Encounter: Payer: Self-pay | Admitting: *Deleted

## 2022-10-01 NOTE — Telephone Encounter (Signed)
Scheduled per inbasket message, patient has been called and notified of upcoming appointment.

## 2022-10-01 NOTE — Telephone Encounter (Signed)
Received oncotype score of 58 on CORE bx. Physician team notified.

## 2022-10-04 ENCOUNTER — Inpatient Hospital Stay (HOSPITAL_BASED_OUTPATIENT_CLINIC_OR_DEPARTMENT_OTHER): Payer: Medicare Other | Admitting: Hematology and Oncology

## 2022-10-04 ENCOUNTER — Other Ambulatory Visit: Payer: Self-pay

## 2022-10-04 ENCOUNTER — Encounter: Payer: Self-pay | Admitting: *Deleted

## 2022-10-04 VITALS — BP 135/67 | HR 85 | Temp 97.9°F | Resp 15 | Wt 158.7 lb

## 2022-10-04 DIAGNOSIS — C50411 Malignant neoplasm of upper-outer quadrant of right female breast: Secondary | ICD-10-CM | POA: Diagnosis not present

## 2022-10-04 DIAGNOSIS — Z853 Personal history of malignant neoplasm of breast: Secondary | ICD-10-CM | POA: Diagnosis not present

## 2022-10-04 DIAGNOSIS — Z923 Personal history of irradiation: Secondary | ICD-10-CM | POA: Diagnosis not present

## 2022-10-04 DIAGNOSIS — Z170 Estrogen receptor positive status [ER+]: Secondary | ICD-10-CM | POA: Diagnosis not present

## 2022-10-04 DIAGNOSIS — G4733 Obstructive sleep apnea (adult) (pediatric): Secondary | ICD-10-CM | POA: Diagnosis not present

## 2022-10-04 DIAGNOSIS — I252 Old myocardial infarction: Secondary | ICD-10-CM | POA: Diagnosis not present

## 2022-10-04 NOTE — Assessment & Plan Note (Addendum)
08/27/2022:Screening mammogram detected right breast mass by ultrasound measured 2.3 cm biopsy revealed grade 3 IDC ER 50%, PR 0%, Ki-67 90%, HER2 1+ negative  Oncotype DX recurrence score: 58 (greater than 39% risk of distant recurrence) he will benefit greater than 15%  Treatment plan: 1.  Mastectomy followed by 2. adjuvant chemotherapy with CMF x 6 cycles  3. Adjuvant antiestrogen therapy (will repeat prognostic panel on the final path and determine if she would benefit from antiestrogen therapy)  Chemo counseling: I discussed the risks and benefits of CMF chemotherapy.  Patient is diabetic and has peripheral neuropathy.  Therefore we decided not to do Taxotere Cytoxan.   With the surgery I will request for port placement Return to clinic after surgery to discuss the final pathology report.

## 2022-10-04 NOTE — Progress Notes (Signed)
Patient Care Team: [name], MD as PCP - General (Internal Medicine) [name], MD as Consulting Physician (Internal Medicine) Nicholas Lose, MD as Consulting Physician (Hematology and Oncology) Mauro Kaufmann, RN as Oncology Nurse Navigator Rockwell Germany, RN as Oncology Nurse Navigator  DIAGNOSIS:  Encounter Diagnosis  Name Primary?   Malignant neoplasm of upper-outer quadrant of right breast in female, estrogen receptor positive ([place]) Yes    SUMMARY OF ONCOLOGIC HISTORY: Oncology History  Malignant neoplasm of upper-outer quadrant of right breast in female, estrogen receptor positive ([place])  08/27/2022 Initial Diagnosis   Screening mammogram detected right breast mass by ultrasound measured 2.3 cm biopsy revealed grade 3 IDC ER 50%, PR 0%, Ki-67 90%, HER2 1+ negative     CHIEF COMPLIANT: Follow-up after surgery  INTERVAL HISTORY: Karina Foster is a 82 y.o. female is here because of recent diagnosis of right breast cancer. She presents to the clinic for a follow-up after surgery to discuss final pathology report and then determine if Oncotype DX testing will need to be sent. She reports that she still have numbness. She says she can button her shirts and open bottle caps. She can also feel hot and cold with her feet. She does have some loose stools.     ALLERGIES:  is allergic to azithromycin and demerol [meperidine].  MEDICATIONS:  Current Outpatient Medications  Medication Sig Dispense Refill   dexamethasone 0.5 MG/5ML elixir Take 1.5 mg by mouth 4 (four) times daily.     aspirin 325 MG tablet Take 325 mg by mouth daily.     azithromycin (ZITHROMAX) 250 MG tablet Take 250 mg by mouth.     BD PEN NEEDLE NANO U/F 32G X 4 MM MISC SMARTSIG:2 Pen Needle SUB-Q Daily     Bempedoic Acid-Ezetimibe (NEXLIZET) 180-10 MG TABS Take 1 tablet by mouth daily. (Patient taking differently: Take 1 tablet by mouth daily.) 90 tablet 3   Cholecalciferol (VITAMIN D3 PO) Take 1  capsule by mouth daily.     citalopram (CELEXA) 40 MG tablet Take 40 mg by mouth daily.     Continuous Blood Gluc Sensor (FREESTYLE LIBRE 14 DAY SENSOR) MISC Apply topically every 14 (fourteen) days.     Cranberry Soft 500 MG CHEW Chew by mouth daily.     HUMALOG KWIKPEN 100 UNIT/ML KwikPen Inject 30 Units into the skin in the morning, at noon, and at bedtime.     metoprolol tartrate (LOPRESSOR) 25 MG tablet TAKE 1/2 TABLET TWICE DAILY (Patient taking differently: Take 12.5 mg by mouth 2 (two) times daily.) 180 tablet 3   nitroGLYCERIN (NITROSTAT) 0.4 MG SL tablet Place 0.4 mg under the tongue every 5 (five) minutes as needed for chest pain.     ramipril (ALTACE) 2.5 MG capsule Take 2.5 mg by mouth daily.     TRESIBA FLEXTOUCH 200 UNIT/ML SOPN Inject 64 Units into the skin daily after lunch.     vitamin B-12 (CYANOCOBALAMIN) 1000 MCG tablet Take 1,000 mcg by mouth daily. ER     No current facility-administered medications for this visit.    PHYSICAL EXAMINATION: ECOG PERFORMANCE STATUS: 1 - Symptomatic but completely ambulatory  Vitals:   10/04/22 1346  BP: 135/67  Pulse: 85  Resp: 15  Temp: 97.9 F (36.6 C)  SpO2: 98%   Filed Weights   10/04/22 1346  Weight: 158 lb 11.2 oz (72 kg)      LABORATORY DATA:  I have reviewed the data as listed  Latest Ref Rng & Units 07/20/2022    7:15 AM 06/22/2022   12:23 PM 12/18/2008    5:05 AM  CMP  Glucose 70 - 99 mg/dL 168  48  147   BUN 8 - 23 mg/dL '20  20  6   '$ Creatinine 0.44 - 1.00 mg/dL 0.90  0.90  0.58   Sodium 135 - 145 mmol/L 138  139  140   Potassium 3.5 - 5.1 mmol/L 4.5  4.4  3.6   Chloride 98 - 111 mmol/L 104  102  103   CO2 19 - 32 mEq/L   28   Calcium 8.4 - 10.5 mg/dL   8.9     Lab Results  Component Value Date   WBC 7.9 12/18/2008   HGB 13.6 07/20/2022   HCT 40.0 07/20/2022   MCV 76.1 (L) 12/18/2008   PLT 288 12/18/2008   NEUTROABS 4.2 12/14/2008    ASSESSMENT & PLAN:  Malignant neoplasm of upper-outer  quadrant of right breast in female, estrogen receptor positive ([place]) 08/27/2022:Screening mammogram detected right breast mass by ultrasound measured 2.3 cm biopsy revealed grade 3 IDC ER 50%, PR 0%, Ki-67 90%, HER2 1+ negative  Oncotype DX recurrence score: 58 (greater than 39% risk of distant recurrence) he will benefit greater than 15%  Treatment plan: 1.  Mastectomy followed by 2. adjuvant chemotherapy with CMF x 6 cycles  3. Adjuvant antiestrogen therapy (will repeat prognostic panel on the final path and determine if she would benefit from antiestrogen therapy)  Chemo counseling: I discussed the risks and benefits of CMF chemotherapy.  Patient is diabetic and has peripheral neuropathy.  Therefore we decided not to do Taxotere Cytoxan.   With the surgery I will request for port placement Return to clinic after surgery to discuss the final pathology report.    No orders of the defined types were placed in this encounter.  The patient has a good understanding of the overall plan. she agrees with it. she will call with any problems that may develop before the next visit here. Total time spent: 30 mins including face to face time and time spent for planning, charting and co-ordination of care   Harriette Ohara, MD 10/04/22    I Gardiner Coins am acting as a Education administrator for [employer]  I have reviewed the above documentation for accuracy and completeness, and I agree with the above.

## 2022-10-07 NOTE — Progress Notes (Signed)
Surgical Instructions    Your procedure is scheduled on Thursday, 10/14/22.  Report to [facility] Main Entrance "A" at 10:00 A.M., then check in with the Admitting office.  Call this number if you have problems the morning of surgery:  [phone]   If you have any questions prior to your surgery date call [phone]: Open Monday-Friday 8am-4pm If you experience any cold or flu symptoms such as cough, fever, chills, shortness of breath, etc. between now and your scheduled surgery, please notify us at the above number     Remember:  Do not eat after midnight the night before your surgery  You may drink clear liquids until 9:00am the morning of your surgery.   Clear liquids allowed are: Water, Non-Citrus Juices (without pulp), Carbonated Beverages, Clear Tea, Black Coffee ONLY (NO MILK, CREAM OR POWDERED CREAMER of any kind), and Gatorade    Take these medicines the morning of surgery with A SIP OF WATER:  azithromycin (ZITHROMAX)  Bempedoic Acid-Ezetimibe (NEXLIZET)  citalopram (CELEXA)  dexamethasone  metoprolol tartrate (LOPRESSOR)    As of today, STOP taking any Aspirin (unless otherwise instructed by your surgeon) Aleve, Naproxen, Ibuprofen, Motrin, Advil, Goody's, BC's, all herbal medications, fish oil, and all vitamins.  WHAT DO I DO ABOUT MY DIABETES MEDICATION?   Do not take oral diabetes medicines (pills) the morning of surgery.  THE MORNING OF SURGERY, do not take TRESIBA.  The day of surgery, do not take other diabetes injectables, including Byetta (exenatide), Bydureon (exenatide ER), Victoza (liraglutide), or Trulicity (dulaglutide).  If your CBG is greater than 220 mg/dL, you may take  of your sliding scale (HUMALOG) dose of insulin.   HOW TO MANAGE YOUR DIABETES BEFORE AND AFTER SURGERY  Why is it important to control my blood sugar before and after surgery? Improving blood sugar levels before and after surgery helps healing and can limit problems. A way  of improving blood sugar control is eating a healthy diet by:  Eating less sugar and carbohydrates  Increasing activity/exercise  Talking with your doctor about reaching your blood sugar goals High blood sugars (greater than 180 mg/dL) can raise your risk of infections and slow your recovery, so you will need to focus on controlling your diabetes during the weeks before surgery. Make sure that the doctor who takes care of your diabetes knows about your planned surgery including the date and location.  How do I manage my blood sugar before surgery? Check your blood sugar at least 4 times a day, starting 2 days before surgery, to make sure that the level is not too high or low.  Check your blood sugar the morning of your surgery when you wake up and every 2 hours until you get to the Short Stay unit.  If your blood sugar is less than 70 mg/dL, you will need to treat for low blood sugar: Do not take insulin. Treat a low blood sugar (less than 70 mg/dL) with  cup of clear juice (cranberry or apple), 4 glucose tablets, OR glucose gel. Recheck blood sugar in 15 minutes after treatment (to make sure it is greater than 70 mg/dL). If your blood sugar is not greater than 70 mg/dL on recheck, call [phone] for further instructions. Report your blood sugar to the short stay nurse when you get to Short Stay.  If you are admitted to the hospital after surgery: Your blood sugar will be checked by the staff and you will probably be given insulin after surgery (instead of  oral diabetes medicines) to make sure you have good blood sugar levels. The goal for blood sugar control after surgery is 80-180 mg/dL.            Do not wear jewelry or makeup. Do not wear lotions, powders, perfumes or deodorant. Do not shave 48 hours prior to surgery.   Do not bring valuables to the hospital. Do not wear nail polish, gel polish, artificial nails, or any other type of covering on natural nails (fingers and  toes) If you have artificial nails or gel coating that need to be removed by a nail salon, please have this removed prior to surgery. Artificial nails or gel coating may interfere with anesthesia's ability to adequately monitor your vital signs.  [place] is not responsible for any belongings or valuables.    Do NOT Smoke (Tobacco/Vaping)  24 hours prior to your procedure  If you use a CPAP at night, you may bring your mask for your overnight stay.   Contacts, glasses, hearing aids, dentures or partials may not be worn into surgery, please bring cases for these belongings   For patients admitted to the hospital, discharge time will be determined by your treatment team.   Patients discharged the day of surgery will not be allowed to drive home, and someone needs to stay with them for 24 hours.   SURGICAL WAITING ROOM VISITATION Patients having surgery or a procedure may have no more than 2 support people in the waiting area - these visitors may rotate.   Children under the age of 25 must have an adult with them who is not the patient. If the patient needs to stay at the hospital during part of their recovery, the visitor guidelines for inpatient rooms apply. Pre-op nurse will coordinate an appropriate time for 1 support person to accompany patient in pre-op.  This support person may not rotate.   Please refer to [email or web address] for the visitor guidelines for Inpatients (after your surgery is over and you are in a regular room).    Special instructions:    Oral Hygiene is also important to reduce your risk of infection.  Remember - BRUSH YOUR TEETH THE MORNING OF SURGERY WITH YOUR REGULAR TOOTHPASTE   [place]- Preparing For Surgery  Before surgery, you can play an important role. Because skin is not sterile, your skin needs to be as free of germs as possible. You can reduce the number of germs on your skin by washing with CHG  (chlorahexidine gluconate) Soap before surgery.  CHG is an antiseptic cleaner which kills germs and bonds with the skin to continue killing germs even after washing.     Please do not use if you have an allergy to CHG or antibacterial soaps. If your skin becomes reddened/irritated stop using the CHG.  Do not shave (including legs and underarms) for at least 48 hours prior to first CHG shower. It is OK to shave your face.  Please follow these instructions carefully.     Shower the NIGHT BEFORE SURGERY and the MORNING OF SURGERY with CHG Soap.   If you chose to wash your hair, wash your hair first as usual with your normal shampoo. After you shampoo, rinse your hair and body thoroughly to remove the shampoo.  Then [employer] and genitals (private parts) with your normal soap and rinse thoroughly to remove soap.  After that Use CHG Soap as you would any other liquid soap. You can apply CHG directly to the skin  and wash gently with a scrungie or a clean washcloth.   Apply the CHG Soap to your body ONLY FROM THE NECK DOWN.  Do not use on open wounds or open sores. Avoid contact with your eyes, ears, mouth and genitals (private parts). Wash Face and genitals (private parts)  with your normal soap.   Wash thoroughly, paying special attention to the area where your surgery will be performed.  Thoroughly rinse your body with warm water from the neck down.  DO NOT shower/wash with your normal soap after using and rinsing off the CHG Soap.  Pat yourself dry with a CLEAN TOWEL.  Wear CLEAN PAJAMAS to bed the night before surgery  Place CLEAN SHEETS on your bed the night before your surgery  DO NOT SLEEP WITH PETS.   Day of Surgery: Take a shower with CHG soap. Wear Clean/Comfortable clothing the morning of surgery Do not apply any deodorants/lotions.   Remember to brush your teeth WITH YOUR REGULAR TOOTHPASTE.    If you received a COVID test during your pre-op visit, it is requested that  you wear a mask when out in public, stay away from anyone that may not be feeling well, and notify your surgeon if you develop symptoms. If you have been in contact with anyone that has tested positive in the last 10 days, please notify your surgeon.    Please read over the following fact sheets that you were given.

## 2022-10-08 ENCOUNTER — Encounter (HOSPITAL_COMMUNITY)
Admission: RE | Admit: 2022-10-08 | Discharge: 2022-10-08 | Disposition: A | Discharge status: Home / Self Care | Payer: Medicare Other | Source: Ambulatory Visit | Attending: General Surgery | Admitting: General Surgery

## 2022-10-08 ENCOUNTER — Encounter (HOSPITAL_COMMUNITY): Payer: Self-pay

## 2022-10-08 ENCOUNTER — Other Ambulatory Visit: Payer: Self-pay

## 2022-10-08 VITALS — BP 122/66 | HR 79 | Temp 98.1°F | Resp 18 | Ht 67.0 in | Wt 157.4 lb

## 2022-10-08 DIAGNOSIS — E119 Type 2 diabetes mellitus without complications: Secondary | ICD-10-CM | POA: Diagnosis not present

## 2022-10-08 DIAGNOSIS — Z01818 Encounter for other preprocedural examination: Secondary | ICD-10-CM

## 2022-10-08 DIAGNOSIS — Z01812 Encounter for preprocedural laboratory examination: Secondary | ICD-10-CM | POA: Insufficient documentation

## 2022-10-08 DIAGNOSIS — Z794 Long term (current) use of insulin: Secondary | ICD-10-CM | POA: Diagnosis not present

## 2022-10-08 HISTORY — DX: Malignant (primary) neoplasm, unspecified: C80.1

## 2022-10-08 HISTORY — DX: Unspecified asthma, uncomplicated: J45.909

## 2022-10-08 LAB — HEMOGLOBIN A1C
Hgb A1c MFr Bld: 6.2 % — ABNORMAL HIGH (ref 4.8–5.6)
Mean Plasma Glucose: 131.24 mg/dL

## 2022-10-08 LAB — GLUCOSE, CAPILLARY: Glucose-Capillary: 80 mg/dL (ref 70–99)

## 2022-10-08 LAB — CBC
HCT: 41.4 % (ref 36.0–46.0)
Hemoglobin: 14 g/dL (ref 12.0–15.0)
MCH: 32.5 pg (ref 26.0–34.0)
MCHC: 33.8 g/dL (ref 30.0–36.0)
MCV: 96.1 fL (ref 80.0–100.0)
Platelets: 282 10*3/uL (ref 150–400)
RBC: 4.31 MIL/uL (ref 3.87–5.11)
RDW: 13.9 % (ref 11.5–15.5)
WBC: 6 10*3/uL (ref 4.0–10.5)
nRBC: 0 % (ref 0.0–0.2)

## 2022-10-08 LAB — BASIC METABOLIC PANEL
Anion gap: 12 (ref 5–15)
BUN: 24 mg/dL — ABNORMAL HIGH (ref 8–23)
CO2: 24 mmol/L (ref 22–32)
Calcium: 9.8 mg/dL (ref 8.9–10.3)
Chloride: 100 mmol/L (ref 98–111)
Creatinine, Ser: 1.04 mg/dL — ABNORMAL HIGH (ref 0.44–1.00)
GFR, Estimated: 55 mL/min — ABNORMAL LOW (ref >60)
Glucose, Bld: 63 mg/dL — ABNORMAL LOW (ref 70–99)
Potassium: 4.2 mmol/L (ref 3.5–5.1)
Sodium: 136 mmol/L (ref 135–145)

## 2022-10-08 NOTE — Progress Notes (Signed)
Surgical Instructions    Your procedure is scheduled on Thursday, 10/14/22.  Report to [facility] Main Entrance "A" at 10:00 A.M., then check in with the Admitting office.  Call this number if you have problems the morning of surgery:  [phone]   If you have any questions prior to your surgery date call [phone]: Open Monday-Friday 8am-4pm If you experience any cold or flu symptoms such as cough, fever, chills, shortness of breath, etc. between now and your scheduled surgery, please notify us at the above number     Remember:  Do not eat after midnight the night before your surgery  You may drink clear liquids until 9:00am the morning of your surgery.   Clear liquids allowed are: Water, Non-Citrus Juices (without pulp), Carbonated Beverages, Clear Tea, Black Coffee ONLY (NO MILK, CREAM OR POWDERED CREAMER of any kind), and Gatorade    Take these medicines the morning of surgery with A SIP OF WATER:   Bempedoic Acid-Ezetimibe (NEXLIZET)  citalopram (CELEXA)  metoprolol tartrate (LOPRESSOR)    As of today, STOP taking any Aspirin (unless otherwise instructed by your surgeon) Aleve, Naproxen, Ibuprofen, Motrin, Advil, Goody's, BC's, all herbal medications, fish oil, and all vitamins.  WHAT DO I DO ABOUT MY DIABETES MEDICATION?   Do not take oral diabetes medicines (pills) the morning of surgery.  THE MORNING OF SURGERY, do not take TRESIBA.  The day of surgery, do not take other diabetes injectables, including Byetta (exenatide), Bydureon (exenatide ER), Victoza (liraglutide), or Trulicity (dulaglutide).  If your CBG is greater than 220 mg/dL, you may take  of your sliding scale (HUMALOG) dose of insulin.   HOW TO MANAGE YOUR DIABETES BEFORE AND AFTER SURGERY  Why is it important to control my blood sugar before and after surgery? Improving blood sugar levels before and after surgery helps healing and can limit problems. A way of improving blood sugar control is eating  a healthy diet by:  Eating less sugar and carbohydrates  Increasing activity/exercise  Talking with your doctor about reaching your blood sugar goals High blood sugars (greater than 180 mg/dL) can raise your risk of infections and slow your recovery, so you will need to focus on controlling your diabetes during the weeks before surgery. Make sure that the doctor who takes care of your diabetes knows about your planned surgery including the date and location.  How do I manage my blood sugar before surgery? Check your blood sugar at least 4 times a day, starting 2 days before surgery, to make sure that the level is not too high or low.  Check your blood sugar the morning of your surgery when you wake up and every 2 hours until you get to the Short Stay unit.  If your blood sugar is less than 70 mg/dL, you will need to treat for low blood sugar: Do not take insulin. Treat a low blood sugar (less than 70 mg/dL) with  cup of clear juice (cranberry or apple), 4 glucose tablets, OR glucose gel. Recheck blood sugar in 15 minutes after treatment (to make sure it is greater than 70 mg/dL). If your blood sugar is not greater than 70 mg/dL on recheck, call [phone] for further instructions. Report your blood sugar to the short stay nurse when you get to Short Stay.  If you are admitted to the hospital after surgery: Your blood sugar will be checked by the staff and you will probably be given insulin after surgery (instead of oral diabetes medicines) to  make sure you have good blood sugar levels. The goal for blood sugar control after surgery is 80-180 mg/dL.            Do not wear jewelry or makeup. Do not wear lotions, powders, perfumes or deodorant. Do not shave 48 hours prior to surgery.   Do not bring valuables to the hospital. Do not wear nail polish, gel polish, artificial nails, or any other type of covering on natural nails (fingers and toes) If you have artificial nails or gel coating  that need to be removed by a nail salon, please have this removed prior to surgery. Artificial nails or gel coating may interfere with anesthesia's ability to adequately monitor your vital signs.  [place] is not responsible for any belongings or valuables.    Do NOT Smoke (Tobacco/Vaping)  24 hours prior to your procedure  If you use a CPAP at night, you may bring your mask for your overnight stay.   Contacts, glasses, hearing aids, dentures or partials may not be worn into surgery, please bring cases for these belongings   For patients admitted to the hospital, discharge time will be determined by your treatment team.   Patients discharged the day of surgery will not be allowed to drive home, and someone needs to stay with them for 24 hours.   SURGICAL WAITING ROOM VISITATION Patients having surgery or a procedure may have no more than 2 support people in the waiting area - these visitors may rotate.   Children under the age of 31 must have an adult with them who is not the patient. If the patient needs to stay at the hospital during part of their recovery, the visitor guidelines for inpatient rooms apply. Pre-op nurse will coordinate an appropriate time for 1 support person to accompany patient in pre-op.  This support person may not rotate.   Please refer to [email or web address] for the visitor guidelines for Inpatients (after your surgery is over and you are in a regular room).    Special instructions:    Oral Hygiene is also important to reduce your risk of infection.  Remember - BRUSH YOUR TEETH THE MORNING OF SURGERY WITH YOUR REGULAR TOOTHPASTE   [place]- Preparing For Surgery  Before surgery, you can play an important role. Because skin is not sterile, your skin needs to be as free of germs as possible. You can reduce the number of germs on your skin by washing with CHG (chlorahexidine gluconate) Soap before surgery.   CHG is an antiseptic cleaner which kills germs and bonds with the skin to continue killing germs even after washing.     Please do not use if you have an allergy to CHG or antibacterial soaps. If your skin becomes reddened/irritated stop using the CHG.  Do not shave (including legs and underarms) for at least 48 hours prior to first CHG shower. It is OK to shave your face.  Please follow these instructions carefully.     Shower the NIGHT BEFORE SURGERY and the MORNING OF SURGERY with CHG Soap.   If you chose to wash your hair, wash your hair first as usual with your normal shampoo. After you shampoo, rinse your hair and body thoroughly to remove the shampoo.  Then [employer] and genitals (private parts) with your normal soap and rinse thoroughly to remove soap.  After that Use CHG Soap as you would any other liquid soap. You can apply CHG directly to the skin and wash gently with  a scrungie or a clean washcloth.   Apply the CHG Soap to your body ONLY FROM THE NECK DOWN.  Do not use on open wounds or open sores. Avoid contact with your eyes, ears, mouth and genitals (private parts). Wash Face and genitals (private parts)  with your normal soap.   Wash thoroughly, paying special attention to the area where your surgery will be performed.  Thoroughly rinse your body with warm water from the neck down.  DO NOT shower/wash with your normal soap after using and rinsing off the CHG Soap.  Pat yourself dry with a CLEAN TOWEL.  Wear CLEAN PAJAMAS to bed the night before surgery  Place CLEAN SHEETS on your bed the night before your surgery  DO NOT SLEEP WITH PETS.   Day of Surgery: Take a shower with CHG soap. Wear Clean/Comfortable clothing the morning of surgery Do not apply any deodorants/lotions.   Remember to brush your teeth WITH YOUR REGULAR TOOTHPASTE.    If you received a COVID test during your pre-op visit, it is requested that you wear a mask when out in public, stay away from  anyone that may not be feeling well, and notify your surgeon if you develop symptoms. If you have been in contact with anyone that has tested positive in the last 10 days, please notify your surgeon.    Please read over the following fact sheets that you were given.

## 2022-10-08 NOTE — Progress Notes (Signed)
Patient's glucose= 63 on today's BMP. CBG was 80 when checked upon arrival to PAT. Called patient to let her know her glucose was 63. Patient states she is at home eating lunch and is asymptomatic.

## 2022-10-08 NOTE — Progress Notes (Signed)
PCP - Dr. [name] Cardiologist - Dr. Adrian Prows  PPM/ICD - n/a Device Orders - n/a Rep Notified - n/a  Chest x-ray - n/a EKG - 09/23/22 Stress Test - 09/21/22 ECHO - 06/16/17 Cardiac Cath - 2010  Sleep Study - +OSA. Patient states she could not tolerate CPAP and now wear a mouthpiece.  CPAP - n/a  CBG today- 80 Wears Freestyle Libre on Left Arm Fasting Blood Sugar Range- 100-140 per patient  Last dose of GLP1 agonist-  n/a GLP1 instructions: n/a  Blood Thinner Instructions: n/a Aspirin Instructions: As of today stop taking Aspirin unless otherwise instructed by your surgeon.   ERAS Protcol - Clear liquids until 0900 day of surgery.  PRE-SURGERY Ensure or G2- None ordered  COVID TEST- n/a   Anesthesia review: Yes. Clearance note from Dr. Einar Gip on 09/23/22.   Patient denies shortness of breath, fever, cough and chest pain at PAT appointment   All instructions explained to the patient, with a verbal understanding of the material. Patient agrees to go over the instructions while at home for a better understanding. Patient also instructed to self quarantine after being tested for COVID-19. The opportunity to ask questions was provided.

## 2022-10-11 NOTE — Progress Notes (Signed)
Anesthesia Chart Review:  Follows with cardiology for history of HTN, HLD, CAD s/p DES x 2 to LAD in 2010.  Last seen by Dr. Einar Gip 09/23/2022, discussed that she had been diagnosed with recurrence of breast cancer and was being scheduled for surgery.  Per note, "Patient has had recurrence of breast cancer on the right, now scheduled for either mastectomy or lumpectomy, surgical preference was mastectomy and patient prefers probably lumpectomy but she will think about this.  I reviewed the nuclear stress test, low risk.  She can be taken up for the upcoming surgery with low risk.  I will send surgical risk letter to Dr. Stark Klein."  History of OSA, could not tolerate CPAP, uses oral appliance.  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  Preop labs reviewed, unremarkable.  EKG 09/23/2022: Normal sinus rhythm at rate of 85 bpm, left atrial enlargement, normal axis.  LVH.  No significant change from 10/23/2020.   Exercise Myoview stress test 09/21/2021: Exercise nuclear stress test was performed using Bruce protocol.   1 Day Rest and Stress images. Exercise time 3 minutes 33 seconds on Bruce protocol, achieved 5.15 METS, 88 % of APMHR. Hypertensive response to exercise: No (rest 150/72, peak 180/80) Stress ECG negative for ischemia. Small size, mild intensity, reversible perfusion defect involving the apical lateral segment, cannot entirely rule out mild ischemia in either the distal RCA/LCx distribution.  Remainder of the myocardial segments illustrate homogenous tracer uptake without evidence of prior infarct. Calculated LVEF 67%, wall thickness and wall motion preserved. Prior Lexiscan dated 05/23/2017 reported normal perfusion with calculated LVEF 47%. Low risk study.   Echocardiogram 06/16/2017: Left ventricle cavity is normal in size. Mild concentric hypertrophy of the left ventricle. Mild decrease in global wall motion. Visual EF is 50-55%. Indeterminate diastolic function. Moderate (Grade II) aortic  regurgitation. Mild aortic valve leaflet calcification. Mild interval increase in aortic regurgitation compared to echocardiogram in 2015.  Mild (Grade I) mitral regurgitation. Mildly restricted mitral valve leaflets. Mild tricuspid regurgitation. No evidence of pulmonary hypertension.     Wynonia Musty [facility] Short Stay Center/Anesthesiology Phone [phone] 10/11/2022 1:55 PM

## 2022-10-11 NOTE — Anesthesia Preprocedure Evaluation (Signed)
Anesthesia Evaluation  Patient identified by MRN, date of birth, ID band Patient awake    Reviewed: Allergy & Precautions, NPO status , Patient's Chart, lab work & pertinent test results, reviewed documented beta blocker date and time   History of Anesthesia Complications Negative for: history of anesthetic complications  Airway Mallampati: II  TM Distance: >3 FB Neck ROM: Full    Dental no notable dental hx.    Pulmonary asthma , sleep apnea , former smoker   Pulmonary exam normal breath sounds clear to auscultation       Cardiovascular hypertension, Pt. on medications and Pt. on home beta blockers + CAD, + Past MI and + Cardiac Stents  Normal cardiovascular exam Rhythm:Regular Rate:Normal  Exercise Myoview stress test 09/21/2022: Exercise nuclear stress test was performed using Bruce protocol.   1 Day Rest and Stress images. Exercise time 3 minutes 33 seconds on Bruce protocol, achieved 5.15 METS, 88 % of APMHR. Hypertensive response to exercise: No (rest 150/72, peak 180/80) Stress ECG negative for ischemia. Small size, mild intensity, reversible perfusion defect involving the apical lateral segment, cannot entirely rule out mild ischemia in either the distal RCA/LCx distribution.  Remainder of the myocardial segments illustrate homogenous tracer uptake without evidence of prior infarct. Calculated LVEF 67%, wall thickness and wall motion preserved. Prior Lexiscan dated 05/23/2017 reported normal perfusion with calculated LVEF 47%. Low risk study.    Neuro/Psych  PSYCHIATRIC DISORDERS  Depression       GI/Hepatic Neg liver ROS,GERD  ,,  Endo/Other  diabetes, Type 2, Insulin Dependent    Renal/GU negative Renal ROS     Musculoskeletal  (+) Arthritis ,    Abdominal   Peds  Hematology negative hematology ROS (+)   Anesthesia Other Findings   Reproductive/Obstetrics                               Anesthesia Physical Anesthesia Plan  ASA: 3  Anesthesia Plan: General   Post-op Pain Management: Tylenol PO (pre-op)* and Regional block*   Induction:   PONV Risk Score and Plan: 3 and Ondansetron, Treatment may vary due to age or medical condition and Dexamethasone  Airway Management Planned: LMA  Additional Equipment: None  Intra-op Plan:   Post-operative Plan: Extubation in OR  Informed Consent: I have reviewed the patients History and Physical, chart, labs and discussed the procedure including the risks, benefits and alternatives for the proposed anesthesia with the patient or authorized representative who has indicated his/her understanding and acceptance.     Dental advisory given  Plan Discussed with: CRNA  Anesthesia Plan Comments: (PAT note by Karoline Caldwell, PA-C: Follows with cardiology for history of HTN, HLD, CAD s/p DES x 2 to LAD in 2010.  Last seen by Dr. Einar Gip 09/23/2022, discussed that she had been diagnosed with recurrence of breast cancer and was being scheduled for surgery.  Per note, "Patient has had recurrence of breast cancer on the right, now scheduled for either mastectomy or lumpectomy, surgical preference was mastectomy and patient prefers probably lumpectomy but she will think about this.  I reviewed the nuclear stress test, low risk.  She can be taken up for the upcoming surgery with low risk.  I will send surgical risk letter to Dr. Stark Klein."  History of OSA, could not tolerate CPAP, uses oral appliance.  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  Preop labs reviewed, unremarkable.  EKG 09/23/2022: Normal sinus  rhythm at rate of 85 bpm, left atrial enlargement, normal axis.  LVH.  No significant change from 10/23/2020.   Exercise Myoview stress test 09/21/2021: Exercise nuclear stress test was performed using Bruce protocol.   1 Day Rest and Stress images. Exercise time 3 minutes 33 seconds on Bruce protocol, achieved 5.15 METS, 88 %  of APMHR. Hypertensive response to exercise: No (rest 150/72, peak 180/80) Stress ECG negative for ischemia. Small size, mild intensity, reversible perfusion defect involving the apical lateral segment, cannot entirely rule out mild ischemia in either the distal RCA/LCx distribution.  Remainder of the myocardial segments illustrate homogenous tracer uptake without evidence of prior infarct. Calculated LVEF 67%, wall thickness and wall motion preserved. Prior Lexiscan dated 05/23/2017 reported normal perfusion with calculated LVEF 47%. Low risk study.   Echocardiogram 06/16/2017: Left ventricle cavity is normal in size. Mild concentric hypertrophy of the left ventricle. Mild decrease in global wall motion. Visual EF is 50-55%. Indeterminate diastolic function. Moderate (Grade II) aortic regurgitation. Mild aortic valve leaflet calcification. Mild interval increase in aortic regurgitation compared to echocardiogram in 2015.  Mild (Grade I) mitral regurgitation. Mildly restricted mitral valve leaflets. Mild tricuspid regurgitation. No evidence of pulmonary hypertension.  )         Anesthesia Quick Evaluation

## 2022-10-12 ENCOUNTER — Encounter: Payer: Self-pay | Admitting: Hematology and Oncology

## 2022-10-12 DIAGNOSIS — E7800 Pure hypercholesterolemia, unspecified: Secondary | ICD-10-CM | POA: Diagnosis not present

## 2022-10-13 DIAGNOSIS — Z794 Long term (current) use of insulin: Secondary | ICD-10-CM | POA: Diagnosis not present

## 2022-10-13 DIAGNOSIS — I10 Essential (primary) hypertension: Secondary | ICD-10-CM | POA: Diagnosis not present

## 2022-10-13 DIAGNOSIS — I2510 Atherosclerotic heart disease of native coronary artery without angina pectoris: Secondary | ICD-10-CM | POA: Diagnosis not present

## 2022-10-13 DIAGNOSIS — E1149 Type 2 diabetes mellitus with other diabetic neurological complication: Secondary | ICD-10-CM | POA: Diagnosis not present

## 2022-10-14 ENCOUNTER — Ambulatory Visit (HOSPITAL_COMMUNITY): Payer: Medicare Other

## 2022-10-14 ENCOUNTER — Ambulatory Visit (HOSPITAL_COMMUNITY): Payer: Medicare Other | Admitting: Physician Assistant

## 2022-10-14 ENCOUNTER — Ambulatory Visit (HOSPITAL_BASED_OUTPATIENT_CLINIC_OR_DEPARTMENT_OTHER): Payer: Medicare Other | Admitting: Physician Assistant

## 2022-10-14 ENCOUNTER — Encounter (HOSPITAL_COMMUNITY)
Admission: RE | Disposition: A | Discharge status: Home / Self Care | Payer: Self-pay | Source: Home / Self Care | Attending: General Surgery

## 2022-10-14 ENCOUNTER — Other Ambulatory Visit: Payer: Self-pay

## 2022-10-14 ENCOUNTER — Ambulatory Visit (HOSPITAL_COMMUNITY)
Admission: RE | Admit: 2022-10-14 | Discharge: 2022-10-15 | Disposition: A | Discharge status: Home / Self Care | Payer: Medicare Other | Attending: General Surgery | Admitting: General Surgery

## 2022-10-14 ENCOUNTER — Encounter (HOSPITAL_COMMUNITY): Payer: Self-pay | Admitting: General Surgery

## 2022-10-14 DIAGNOSIS — C50411 Malignant neoplasm of upper-outer quadrant of right female breast: Secondary | ICD-10-CM

## 2022-10-14 DIAGNOSIS — E119 Type 2 diabetes mellitus without complications: Secondary | ICD-10-CM | POA: Diagnosis not present

## 2022-10-14 DIAGNOSIS — K219 Gastro-esophageal reflux disease without esophagitis: Secondary | ICD-10-CM | POA: Diagnosis not present

## 2022-10-14 DIAGNOSIS — I252 Old myocardial infarction: Secondary | ICD-10-CM

## 2022-10-14 DIAGNOSIS — Z794 Long term (current) use of insulin: Secondary | ICD-10-CM | POA: Insufficient documentation

## 2022-10-14 DIAGNOSIS — I10 Essential (primary) hypertension: Secondary | ICD-10-CM | POA: Insufficient documentation

## 2022-10-14 DIAGNOSIS — Z9221 Personal history of antineoplastic chemotherapy: Secondary | ICD-10-CM | POA: Insufficient documentation

## 2022-10-14 DIAGNOSIS — Z452 Encounter for adjustment and management of vascular access device: Secondary | ICD-10-CM | POA: Diagnosis not present

## 2022-10-14 DIAGNOSIS — I2510 Atherosclerotic heart disease of native coronary artery without angina pectoris: Secondary | ICD-10-CM

## 2022-10-14 DIAGNOSIS — Z803 Family history of malignant neoplasm of breast: Secondary | ICD-10-CM | POA: Insufficient documentation

## 2022-10-14 DIAGNOSIS — E785 Hyperlipidemia, unspecified: Secondary | ICD-10-CM | POA: Insufficient documentation

## 2022-10-14 DIAGNOSIS — F32A Depression, unspecified: Secondary | ICD-10-CM | POA: Diagnosis not present

## 2022-10-14 DIAGNOSIS — Z87891 Personal history of nicotine dependence: Secondary | ICD-10-CM | POA: Diagnosis not present

## 2022-10-14 DIAGNOSIS — G4730 Sleep apnea, unspecified: Secondary | ICD-10-CM | POA: Insufficient documentation

## 2022-10-14 DIAGNOSIS — H8110 Benign paroxysmal vertigo, unspecified ear: Secondary | ICD-10-CM | POA: Diagnosis not present

## 2022-10-14 DIAGNOSIS — Z170 Estrogen receptor positive status [ER+]: Secondary | ICD-10-CM | POA: Diagnosis not present

## 2022-10-14 DIAGNOSIS — Z955 Presence of coronary angioplasty implant and graft: Secondary | ICD-10-CM | POA: Diagnosis not present

## 2022-10-14 DIAGNOSIS — Z923 Personal history of irradiation: Secondary | ICD-10-CM | POA: Diagnosis not present

## 2022-10-14 DIAGNOSIS — G8918 Other acute postprocedural pain: Secondary | ICD-10-CM | POA: Diagnosis not present

## 2022-10-14 DIAGNOSIS — R921 Mammographic calcification found on diagnostic imaging of breast: Secondary | ICD-10-CM | POA: Diagnosis not present

## 2022-10-14 DIAGNOSIS — C50911 Malignant neoplasm of unspecified site of right female breast: Secondary | ICD-10-CM | POA: Diagnosis not present

## 2022-10-14 HISTORY — PX: TOTAL MASTECTOMY: SHX6129

## 2022-10-14 HISTORY — PX: PORTACATH PLACEMENT: SHX2246

## 2022-10-14 LAB — GLUCOSE, CAPILLARY
Glucose-Capillary: 119 mg/dL — ABNORMAL HIGH (ref 70–99)
Glucose-Capillary: 190 mg/dL — ABNORMAL HIGH (ref 70–99)
Glucose-Capillary: 250 mg/dL — ABNORMAL HIGH (ref 70–99)
Glucose-Capillary: 230 mg/dL — ABNORMAL HIGH (ref 70–99)
Glucose-Capillary: 215 mg/dL — ABNORMAL HIGH (ref 70–99)
Glucose-Capillary: 247 mg/dL — ABNORMAL HIGH (ref 70–99)

## 2022-10-14 SURGERY — MASTECTOMY, SIMPLE
Anesthesia: General | Laterality: Right

## 2022-10-14 MED ORDER — INSULIN ASPART 100 UNIT/ML IJ SOLN
0.0000 [IU] | Freq: Every day | INTRAMUSCULAR | Status: DC
  Administered 2022-10-14: 2 [IU] via SUBCUTANEOUS

## 2022-10-14 MED ORDER — HYDROMORPHONE HCL 1 MG/ML IJ SOLN
INTRAMUSCULAR | Status: AC
  Filled 2022-10-14: qty 1

## 2022-10-14 MED ORDER — INSULIN ASPART 100 UNIT/ML IJ SOLN
0.0000 [IU] | Freq: Three times a day (TID) | INTRAMUSCULAR | Status: DC
  Administered 2022-10-14 – 2022-10-15 (×3): 3 [IU] via SUBCUTANEOUS

## 2022-10-14 MED ORDER — METOPROLOL TARTRATE 12.5 MG HALF TABLET
12.5000 mg | ORAL_TABLET | Freq: Two times a day (BID) | ORAL | Status: DC
  Administered 2022-10-15: 12.5 mg via ORAL
  Filled 2022-10-14 (×2): qty 1

## 2022-10-14 MED ORDER — DEXAMETHASONE SODIUM PHOSPHATE 10 MG/ML IJ SOLN
INTRAMUSCULAR | Status: DC | PRN
  Administered 2022-10-14: 10 mg via INTRAVENOUS

## 2022-10-14 MED ORDER — CLONIDINE HCL (ANALGESIA) 100 MCG/ML EP SOLN
EPIDURAL | Status: DC | PRN
  Administered 2022-10-14: 80 ug

## 2022-10-14 MED ORDER — ONDANSETRON HCL 4 MG/2ML IJ SOLN
INTRAMUSCULAR | Status: AC
  Filled 2022-10-14: qty 2

## 2022-10-14 MED ORDER — LIDOCAINE HCL 1 % IJ SOLN
INTRAMUSCULAR | Status: AC
  Filled 2022-10-14: qty 20

## 2022-10-14 MED ORDER — HEPARIN 6000 UNIT IRRIGATION SOLUTION
Status: AC
  Filled 2022-10-14: qty 500

## 2022-10-14 MED ORDER — ACETAMINOPHEN 500 MG PO TABS: 1000.0000 mg | ORAL_TABLET | ORAL | Status: AC

## 2022-10-14 MED ORDER — AMISULPRIDE (ANTIEMETIC) 5 MG/2ML IV SOLN: 10.0000 mg | Freq: Once | INTRAVENOUS | Status: DC | PRN

## 2022-10-14 MED ORDER — HEPARIN 6000 UNIT IRRIGATION SOLUTION
Status: DC | PRN
  Administered 2022-10-14: 1

## 2022-10-14 MED ORDER — CHLORHEXIDINE GLUCONATE 0.12 % MT SOLN
15.0000 mL | Freq: Once | OROMUCOSAL | Status: AC
  Administered 2022-10-14: 15 mL via OROMUCOSAL
  Filled 2022-10-14: qty 15

## 2022-10-14 MED ORDER — MIDAZOLAM HCL 2 MG/2ML IJ SOLN
INTRAMUSCULAR | Status: AC
  Filled 2022-10-14: qty 2

## 2022-10-14 MED ORDER — PROPOFOL 10 MG/ML IV BOLUS
INTRAVENOUS | Status: DC | PRN
  Administered 2022-10-14: 110 mg via INTRAVENOUS

## 2022-10-14 MED ORDER — INSULIN ASPART 100 UNIT/ML IJ SOLN: 0.0000 [IU] | INTRAMUSCULAR | Status: DC | PRN

## 2022-10-14 MED ORDER — ACETAMINOPHEN 500 MG PO TABS
1000.0000 mg | ORAL_TABLET | Freq: Four times a day (QID) | ORAL | Status: DC
  Administered 2022-10-14 – 2022-10-15 (×4): 1000 mg via ORAL
  Filled 2022-10-14 (×4): qty 2

## 2022-10-14 MED ORDER — FENTANYL CITRATE (PF) 250 MCG/5ML IJ SOLN
INTRAMUSCULAR | Status: AC
  Filled 2022-10-14: qty 5

## 2022-10-14 MED ORDER — DEXAMETHASONE SODIUM PHOSPHATE 10 MG/ML IJ SOLN
INTRAMUSCULAR | Status: AC
  Filled 2022-10-14: qty 1

## 2022-10-14 MED ORDER — CHLORHEXIDINE GLUCONATE CLOTH 2 % EX PADS: 6.0000 | MEDICATED_PAD | Freq: Once | CUTANEOUS | Status: DC

## 2022-10-14 MED ORDER — CEFAZOLIN SODIUM-DEXTROSE 2-4 GM/100ML-% IV SOLN
2.0000 g | Freq: Three times a day (TID) | INTRAVENOUS | Status: AC
  Administered 2022-10-14: 2 g via INTRAVENOUS
  Filled 2022-10-14 (×2): qty 100

## 2022-10-14 MED ORDER — PROCHLORPERAZINE MALEATE 10 MG PO TABS: 10.0000 mg | ORAL_TABLET | Freq: Four times a day (QID) | ORAL | Status: DC | PRN

## 2022-10-14 MED ORDER — METHOCARBAMOL 500 MG PO TABS: 500.0000 mg | ORAL_TABLET | Freq: Four times a day (QID) | ORAL | Status: DC | PRN

## 2022-10-14 MED ORDER — 0.9 % SODIUM CHLORIDE (POUR BTL) OPTIME
TOPICAL | Status: DC | PRN
  Administered 2022-10-14: 1000 mL

## 2022-10-14 MED ORDER — OXYCODONE HCL 5 MG PO TABS
5.0000 mg | ORAL_TABLET | Freq: Four times a day (QID) | ORAL | Status: DC | PRN
  Administered 2022-10-14: 5 mg via ORAL

## 2022-10-14 MED ORDER — HEPARIN SOD (PORK) LOCK FLUSH 100 UNIT/ML IV SOLN
INTRAVENOUS | Status: AC
  Filled 2022-10-14: qty 10

## 2022-10-14 MED ORDER — PROCHLORPERAZINE EDISYLATE 10 MG/2ML IJ SOLN: 5.0000 mg | Freq: Four times a day (QID) | INTRAMUSCULAR | Status: DC | PRN

## 2022-10-14 MED ORDER — FENTANYL CITRATE (PF) 250 MCG/5ML IJ SOLN
INTRAMUSCULAR | Status: DC | PRN
  Administered 2022-10-14: 50 ug via INTRAVENOUS
  Administered 2022-10-14 (×4): 25 ug via INTRAVENOUS

## 2022-10-14 MED ORDER — LIDOCAINE 2% (20 MG/ML) 5 ML SYRINGE
INTRAMUSCULAR | Status: AC
  Filled 2022-10-14: qty 5

## 2022-10-14 MED ORDER — PHENYLEPHRINE 80 MCG/ML (10ML) SYRINGE FOR IV PUSH (FOR BLOOD PRESSURE SUPPORT)
PREFILLED_SYRINGE | INTRAVENOUS | Status: AC
  Filled 2022-10-14: qty 10

## 2022-10-14 MED ORDER — GABAPENTIN 100 MG PO CAPS
100.0000 mg | ORAL_CAPSULE | Freq: Two times a day (BID) | ORAL | Status: DC
  Administered 2022-10-14 – 2022-10-15 (×2): 100 mg via ORAL
  Filled 2022-10-14 (×3): qty 1

## 2022-10-14 MED ORDER — EPHEDRINE 5 MG/ML INJ
INTRAVENOUS | Status: AC
  Filled 2022-10-14: qty 5

## 2022-10-14 MED ORDER — ONDANSETRON 4 MG PO TBDP: 4.0000 mg | ORAL_TABLET | Freq: Four times a day (QID) | ORAL | Status: DC | PRN

## 2022-10-14 MED ORDER — PHENYLEPHRINE 80 MCG/ML (10ML) SYRINGE FOR IV PUSH (FOR BLOOD PRESSURE SUPPORT)
PREFILLED_SYRINGE | INTRAVENOUS | Status: DC | PRN
  Administered 2022-10-14: 80 ug via INTRAVENOUS
  Administered 2022-10-14: 160 ug via INTRAVENOUS
  Administered 2022-10-14: 80 ug via INTRAVENOUS
  Administered 2022-10-14: 160 ug via INTRAVENOUS
  Administered 2022-10-14: 80 ug via INTRAVENOUS

## 2022-10-14 MED ORDER — ACETAMINOPHEN 500 MG PO TABS
ORAL_TABLET | ORAL | Status: AC
  Administered 2022-10-14: 1000 mg via ORAL
  Filled 2022-10-14: qty 2

## 2022-10-14 MED ORDER — KCL IN DEXTROSE-NACL 20-5-0.45 MEQ/L-%-% IV SOLN
INTRAVENOUS | Status: AC
  Filled 2022-10-14 (×2): qty 1000

## 2022-10-14 MED ORDER — RAMIPRIL 1.25 MG PO CAPS
2.5000 mg | ORAL_CAPSULE | Freq: Every day | ORAL | Status: DC
  Administered 2022-10-15: 2.5 mg via ORAL
  Filled 2022-10-14: qty 2

## 2022-10-14 MED ORDER — DIPHENHYDRAMINE HCL 50 MG/ML IJ SOLN: 12.5000 mg | Freq: Four times a day (QID) | INTRAMUSCULAR | Status: DC | PRN

## 2022-10-14 MED ORDER — CITALOPRAM HYDROBROMIDE 40 MG PO TABS
40.0000 mg | ORAL_TABLET | Freq: Every day | ORAL | Status: DC
  Administered 2022-10-14 – 2022-10-15 (×2): 40 mg via ORAL
  Filled 2022-10-14 (×3): qty 1

## 2022-10-14 MED ORDER — TRAMADOL HCL 50 MG PO TABS: 50.0000 mg | ORAL_TABLET | Freq: Four times a day (QID) | ORAL | Status: DC | PRN

## 2022-10-14 MED ORDER — OXYCODONE HCL 5 MG PO TABS
ORAL_TABLET | ORAL | Status: AC
  Filled 2022-10-14: qty 1

## 2022-10-14 MED ORDER — LIDOCAINE 2% (20 MG/ML) 5 ML SYRINGE
INTRAMUSCULAR | Status: DC | PRN
  Administered 2022-10-14: 70 mg via INTRAVENOUS

## 2022-10-14 MED ORDER — MELATONIN 3 MG PO TABS: 3.0000 mg | ORAL_TABLET | Freq: Every evening | ORAL | Status: DC | PRN

## 2022-10-14 MED ORDER — DEXAMETHASONE SODIUM PHOSPHATE 4 MG/ML IJ SOLN
INTRAMUSCULAR | Status: DC | PRN
  Administered 2022-10-14: 5 mg via PERINEURAL

## 2022-10-14 MED ORDER — PROPOFOL 10 MG/ML IV BOLUS
INTRAVENOUS | Status: AC
  Filled 2022-10-14: qty 20

## 2022-10-14 MED ORDER — BEMPEDOIC ACID-EZETIMIBE 180-10 MG PO TABS: 1.0000 | ORAL_TABLET | Freq: Every day | ORAL | Status: DC

## 2022-10-14 MED ORDER — LIDOCAINE HCL 1 % IJ SOLN
INTRAMUSCULAR | Status: DC | PRN
  Administered 2022-10-14: 25 mL

## 2022-10-14 MED ORDER — CEFAZOLIN SODIUM-DEXTROSE 2-4 GM/100ML-% IV SOLN
2.0000 g | INTRAVENOUS | Status: AC
  Administered 2022-10-14: 2 g via INTRAVENOUS

## 2022-10-14 MED ORDER — BUPIVACAINE HCL (PF) 0.25 % IJ SOLN
INTRAMUSCULAR | Status: AC
  Filled 2022-10-14: qty 30

## 2022-10-14 MED ORDER — CEFAZOLIN SODIUM-DEXTROSE 2-4 GM/100ML-% IV SOLN
INTRAVENOUS | Status: AC
  Filled 2022-10-14: qty 100

## 2022-10-14 MED ORDER — FENTANYL CITRATE PF 50 MCG/ML IJ SOSY: 12.5000 ug | PREFILLED_SYRINGE | INTRAMUSCULAR | Status: DC | PRN

## 2022-10-14 MED ORDER — HYDROMORPHONE HCL 1 MG/ML IJ SOLN
0.2500 mg | INTRAMUSCULAR | Status: DC | PRN
  Administered 2022-10-14 (×4): 0.5 mg via INTRAVENOUS

## 2022-10-14 MED ORDER — ONDANSETRON HCL 4 MG/2ML IJ SOLN: 4.0000 mg | Freq: Four times a day (QID) | INTRAMUSCULAR | Status: DC | PRN

## 2022-10-14 MED ORDER — SENNA 8.6 MG PO TABS
1.0000 | ORAL_TABLET | Freq: Two times a day (BID) | ORAL | Status: DC
  Administered 2022-10-14 – 2022-10-15 (×3): 8.6 mg via ORAL
  Filled 2022-10-14 (×5): qty 1

## 2022-10-14 MED ORDER — MIDAZOLAM HCL 2 MG/2ML IJ SOLN
INTRAMUSCULAR | Status: DC | PRN
  Administered 2022-10-14: .5 mg via INTRAVENOUS

## 2022-10-14 MED ORDER — EPHEDRINE SULFATE-NACL 50-0.9 MG/10ML-% IV SOSY
PREFILLED_SYRINGE | INTRAVENOUS | Status: DC | PRN
  Administered 2022-10-14: 5 mg via INTRAVENOUS
  Administered 2022-10-14 (×2): 10 mg via INTRAVENOUS

## 2022-10-14 MED ORDER — ONDANSETRON HCL 4 MG/2ML IJ SOLN
INTRAMUSCULAR | Status: DC | PRN
  Administered 2022-10-14: 4 mg via INTRAVENOUS

## 2022-10-14 MED ORDER — LACTATED RINGERS IV SOLN: INTRAVENOUS | Status: DC

## 2022-10-14 MED ORDER — BUPIVACAINE-EPINEPHRINE (PF) 0.25% -1:200000 IJ SOLN
INTRAMUSCULAR | Status: AC
  Filled 2022-10-14: qty 30

## 2022-10-14 MED ORDER — DIPHENHYDRAMINE HCL 12.5 MG/5ML PO ELIX: 12.5000 mg | ORAL_SOLUTION | Freq: Four times a day (QID) | ORAL | Status: DC | PRN

## 2022-10-14 MED ORDER — INSULIN ASPART 100 UNIT/ML IJ SOLN
20.0000 [IU] | Freq: Three times a day (TID) | INTRAMUSCULAR | Status: DC
  Administered 2022-10-14 – 2022-10-15 (×3): 20 [IU] via SUBCUTANEOUS

## 2022-10-14 MED ORDER — ROPIVACAINE HCL 5 MG/ML IJ SOLN
INTRAMUSCULAR | Status: DC | PRN
  Administered 2022-10-14: 30 mL via EPIDURAL

## 2022-10-14 MED ORDER — ORAL CARE MOUTH RINSE: 15.0000 mL | Freq: Once | OROMUCOSAL | Status: AC

## 2022-10-14 MED ORDER — NITROGLYCERIN 0.4 MG SL SUBL: 0.4000 mg | SUBLINGUAL_TABLET | SUBLINGUAL | Status: DC | PRN

## 2022-10-14 SURGICAL SUPPLY — 69 items
ADH SKN CLS APL DERMABOND .7 (GAUZE/BANDAGES/DRESSINGS) ×2
ADH SKN CLS LQ APL DERMABOND (GAUZE/BANDAGES/DRESSINGS) ×2
AGENT HMST 10 BLLW SHRT CANN (HEMOSTASIS) ×2
APL PRP STRL LF DISP 70% ISPRP (MISCELLANEOUS) ×2
BAG COUNTER SPONGE SURGICOUNT (BAG) ×2 IMPLANT
BAG DECANTER FOR FLEXI CONT (MISCELLANEOUS) ×2 IMPLANT
BAG SPNG CNTER NS LX DISP (BAG) ×2
BINDER BREAST LRG (GAUZE/BANDAGES/DRESSINGS) IMPLANT
BINDER BREAST XLRG (GAUZE/BANDAGES/DRESSINGS) IMPLANT
BIOPATCH RED 1 DISK 7.0 (GAUZE/BANDAGES/DRESSINGS) IMPLANT
CANISTER SUCT 3000ML PPV (MISCELLANEOUS) ×4 IMPLANT
CHLORAPREP W/TINT 26 (MISCELLANEOUS) ×2 IMPLANT
CLIP TI MEDIUM 24 (CLIP) IMPLANT
COVER SURGICAL LIGHT HANDLE (MISCELLANEOUS) ×2 IMPLANT
COVER TRANSDUCER ULTRASND GEL (DISPOSABLE) IMPLANT
DERMABOND ADVANCED .7 DNX12 (GAUZE/BANDAGES/DRESSINGS) ×2 IMPLANT
DERMABOND ADVANCED .7 DNX6 (GAUZE/BANDAGES/DRESSINGS) IMPLANT
DRAIN CHANNEL 19F RND (DRAIN) ×2 IMPLANT
DRAPE C-ARM 42X120 X-RAY (DRAPES) ×2 IMPLANT
DRAPE CHEST BREAST 15X10 FENES (DRAPES) ×2 IMPLANT
DRAPE UTILITY XL STRL (DRAPES) IMPLANT
DRAPE WARM FLUID 44X44 (DRAPES) IMPLANT
ELECT COATED BLADE 2.86 ST (ELECTRODE) ×2 IMPLANT
ELECT REM PT RETURN 9FT ADLT (ELECTROSURGICAL) ×2
ELECTRODE REM PT RTRN 9FT ADLT (ELECTROSURGICAL) ×2 IMPLANT
EVACUATOR SILICONE 100CC (DRAIN) ×2 IMPLANT
GAUZE 4X4 16PLY ~~LOC~~+RFID DBL (SPONGE) ×2 IMPLANT
GAUZE PAD ABD 7.5X8 STRL (GAUZE/BANDAGES/DRESSINGS) IMPLANT
GAUZE PAD ABD 8X10 STRL (GAUZE/BANDAGES/DRESSINGS) ×2 IMPLANT
GAUZE SPONGE 4X4 12PLY STRL (GAUZE/BANDAGES/DRESSINGS) ×2 IMPLANT
GEL ULTRASOUND 20GR AQUASONIC (MISCELLANEOUS) IMPLANT
GLOVE BIO SURGEON STRL SZ 6 (GLOVE) ×2 IMPLANT
GLOVE INDICATOR 6.5 STRL GRN (GLOVE) ×2 IMPLANT
GOWN STRL REUS W/ TWL LRG LVL3 (GOWN DISPOSABLE) ×2 IMPLANT
GOWN STRL REUS W/ TWL XL LVL3 (GOWN DISPOSABLE) ×2 IMPLANT
GOWN STRL REUS W/TWL LRG LVL3 (GOWN DISPOSABLE) ×2
GOWN STRL REUS W/TWL XL LVL3 (GOWN DISPOSABLE) ×2
HEMOSTAT HEMOBLAST BELLOWS (HEMOSTASIS) IMPLANT
ILLUMINATOR WAVEGUIDE N/F (MISCELLANEOUS) IMPLANT
KIT BASIN OR (CUSTOM PROCEDURE TRAY) ×2 IMPLANT
KIT PORT POWER 8FR ISP CVUE (Port) IMPLANT
KIT TURNOVER KIT B (KITS) ×2 IMPLANT
LIGHT WAVEGUIDE WIDE FLAT (MISCELLANEOUS) IMPLANT
NDL 22X1.5 STRL (OR ONLY) (MISCELLANEOUS) ×2 IMPLANT
NDL SPNL 18GX3.5 QUINCKE PK (NEEDLE) IMPLANT
NEEDLE 22X1.5 STRL (OR ONLY) (MISCELLANEOUS) ×2 IMPLANT
NEEDLE SPNL 18GX3.5 QUINCKE PK (NEEDLE) IMPLANT
NS IRRIG 1000ML POUR BTL (IV SOLUTION) ×2 IMPLANT
PACK GENERAL/GYN (CUSTOM PROCEDURE TRAY) ×2 IMPLANT
PAD ARMBOARD 7.5X6 YLW CONV (MISCELLANEOUS) ×2 IMPLANT
PENCIL BUTTON HOLSTER BLD 10FT (ELECTRODE) ×2 IMPLANT
PENCIL SMOKE EVACUATOR (MISCELLANEOUS) ×2 IMPLANT
POSITIONER HEAD DONUT 9IN (MISCELLANEOUS) ×2 IMPLANT
SPIKE FLUID TRANSFER (MISCELLANEOUS) ×4 IMPLANT
STRIP CLOSURE SKIN 1/2X4 (GAUZE/BANDAGES/DRESSINGS) ×2 IMPLANT
SUT ETHILON 2 0 FS 18 (SUTURE) ×2 IMPLANT
SUT MNCRL AB 4-0 PS2 18 (SUTURE) IMPLANT
SUT MON AB 4-0 PC3 18 (SUTURE) ×2 IMPLANT
SUT PROLENE 2 0 SH DA (SUTURE) ×4 IMPLANT
SUT VIC AB 3-0 SH 27 (SUTURE) ×2
SUT VIC AB 3-0 SH 27X BRD (SUTURE) ×2 IMPLANT
SUT VIC AB 3-0 SH 8-18 (SUTURE) ×2 IMPLANT
SYR 50ML LL SCALE MARK (SYRINGE) IMPLANT
SYR 5ML LUER SLIP (SYRINGE) ×2 IMPLANT
TOWEL GREEN STERILE (TOWEL DISPOSABLE) ×2 IMPLANT
TOWEL GREEN STERILE FF (TOWEL DISPOSABLE) ×2 IMPLANT
TRAY LAPAROSCOPIC MC (CUSTOM PROCEDURE TRAY) ×2 IMPLANT
TUBE CONNECTING 12X1/4 (SUCTIONS) IMPLANT
YANKAUER SUCT BULB TIP NO VENT (SUCTIONS) IMPLANT

## 2022-10-14 NOTE — Op Note (Signed)
Right Mastectomy with Sentinel Node Biopsy, port placement (left subclavian)  Indications: This patient presents with history of new right breast cancer.  Patient has a history of right breast cancer and is s/p breast conservation, ALND, and port/chemo followed by radiation in 1999.  The new cancer is very weakly ER positive, PR negative, and her 2 negative.  Ki 67 is 90% and it is grade 3. She was deciding between repeat lumpectomy with the possibility of additional radiation vs lumpectomy.  She decided for mastectomy given the aggressive nature of the new cancer.  She has good performance status, so will receive chemo.    Pre-operative Diagnosis: right breast cancer, cT2Nx(s/p ALND) M0, upper outer quadrant, receptors +/-/-, grade 3 invasive ductal carcinoma  Post-operative Diagnosis: same  Surgeon: Stark Klein   Assistant:  Pryor Curia, RNFA  Anesthesia: General endotracheal anesthesia and pectoral block  ASA Class: 3  Procedure Details  The patient was seen in the Holding Room. The risks, benefits, complications, treatment options, and expected outcomes were discussed with the patient. The possibilities of reaction to medication, pulmonary aspiration, bleeding, infection, the need for additional procedures, failure to diagnose a condition, and creating a complication requiring transfusion or operation were discussed with the patient. The patient concurred with the proposed plan, giving informed consent.  The site of surgery properly noted/marked. The patient was taken to Operating Room # 2, identified as Mariluz Crespo and the procedure verified as right Mastectomy and port placement. A Time Out was held and the above information confirmed.    After induction of anesthesia, the bilateral breast, chest, and neck were prepped and draped in standard fashion.    The port was addressed first.  Local anesthetic was administered over this area at the angle of the clavicle.  The vein was accessed  with 1 pass(es) of the needle. There was good venous return and the wire passed easily with no ectopy.  Fluoroscopy was used to confirm that the wire was in the vena cava.      The patient was placed back level and the area for the pocket was anethetized   with local anesthetic.  A 3-cm transverse incision was made with a #15   blade.  Cautery was used to divide the subcutaneous tissues down to the   pectoralis muscle.  An [employer] retractor was used to elevate the skin   while a pocket was created on top of the pectoralis fascia.  The port   was placed into the pocket to confirm that it was of adequate size.  The   catheter was preattached to the port.  The port was then secured to the   pectoralis fascia with four 2-0 Prolene sutures.  These were clamped and   not tied down yet.    The catheter was tunneled through to the wire exit site.  The catheter was placed along the wire to determine what length it should be to be in the SVC.  The catheter was cut at 28 cm.  The tunneler sheath and dilator were passed over the wire and the dilator and wire were removed.  The catheter was advanced through the tunneler sheath and the tunneler sheath was pulled away.  Care was taken to keep the catheter in the tunneler sheath as this occurred. This was advanced and the tunneler sheath was removed.  There was good venous return and easy flush of the catheter.  The Prolene sutures were tied down to the pectoral fascia.  The skin  was reapproximated using 3-0 Vicryl interrupted deep dermal sutures.    Fluoroscopy was used to re-confirm good position of the catheter.  The skin   was then closed using 4-0 Monocryl in a subcuticular fashion.  The port was flushed with concentrated heparin flush as well.  The wounds were then cleaned, dried, and dressed with Dermabond.   Attention was then directed toward the right breast.  The borders of the breast were identified and marked.  The incision was drawn out to make sure  incision lines were equidistant in length and that there was adequate skin to close.  The incision was made with the #10 blade.  Mastectomy hooks were used to provide elevation of the skin edges, and the cautery was used to create the mastectomy flaps.  The dissection was taken to the fascia of the pectoralis major.  The penetrating vessels were clipped as needed.  The superior flap was taken medially to the lateral sternal border, superiorly to the inferior border of the clavicle.  The inferior flap was similarly created, inferiorly to the inframammary fold and laterally to the border of the latissimus.  The breast was taken off including the pectoralis fascia and the axillary tail marked.  The axilla was tethered down s/p ALND with no residual lymphatic tissue appreciated.  The skin was freed up in order to close the skin better.  Some skin was trimmed up at the prior ALND scar where there was poor vascularization.  Addition skin was taken at the inferior flap where the tumor was closest.    The wound was irrigated.  Hemostasis was achieved with cautery.  Hemoblast was placed into the wound.  One 19 Blake drain was placed laterally and secured with a 2-0 nylon.  The wound was irrigated and closed with a 3-0 Vicryl deep dermal interrupted sutures and 4-0 Vicryl subcuticular closure in layers.    Sterile dressings were applied. At the end of the operation, all sponge, instrument, and needle counts were correct.  Findings: grossly clear surgical margins, port in good position in SVC  Estimated Blood Loss: <50 mL          Drains: 19 Fr blake drain in right chest wall                Specimens: right breast and additional inferior margin.           Complications:  None; patient tolerated the procedure well.         Disposition: PACU - hemodynamically stable.         Condition: stable

## 2022-10-14 NOTE — Anesthesia Procedure Notes (Signed)
Procedure Name: LMA Insertion Date/Time: 10/14/2022 8:05 AM  Performed by: Michele Rockers, CRNAPre-anesthesia Checklist: Patient identified, Emergency Drugs available, Suction available and Patient being monitored Patient Re-evaluated:Patient Re-evaluated prior to induction Oxygen Delivery Method: Circle system utilized Preoxygenation: Pre-oxygenation with 100% oxygen Induction Type: IV induction Ventilation: Mask ventilation without difficulty LMA: LMA flexible inserted LMA Size: 4.0 Number of attempts: 1 Placement Confirmation: positive ETCO2 and breath sounds checked- equal and bilateral Tube secured with: Tape Dental Injury: Teeth and Oropharynx as per pre-operative assessment

## 2022-10-14 NOTE — H&P (Signed)
REFERRING PHYSICIAN: [facility]  PROVIDER: Georgianne Fick, MD  Care Team: Patient Care Team: Tivis Ringer, MD as PCP - General (Internal Medicine) Vernona Rieger, OD (Optometry) Barry Dienes Ballard Russell, MD as Consulting Provider (Surgical Oncology) Laverda Page, MD (Cardiovascular Disease)  MRN: [id number] DOB: 1941/06/18  Subjective  Chief Complaint: New Consultation and Breast Cancer  History of Present Illness: Karina Foster is a 82 y.o. female who is seen today as an office consultation at the request of Dr. Dagmar Hait for evaluation of New Consultation and Breast Cancer . Patient presents with new right-sided breast cancer 08/2022. The patient had a palpable mass and subsequently underwent diagnostic imaging. She had an area of skin dimpling and a 2.6 cm mass at 9:00 on the right. Core needle biopsy was performed. This demonstrated a grade 3 invasive ductal carcinoma that was ER positive, PR negative and HER2 negative. Ki-67 was 90%.  Of note, the patient has a history of a right breast lumpectomy with axillary lymph node dissection, chemotherapy, and radiation 25 years ago.  Family cancer history - mother and paternal aunt had breast cancer. Social: She lives in a duplex type home at [place]. She does her laundry and light housekeeping.  Diagnostic mammogram/us: 08/18/2022 ACR Breast Density Category b: There are scattered areas of fibroglandular density.  FINDINGS: Partly obscured mass in the upper outer right breast, measuring approximately 1.7 cm in size, associated with architectural distortion. This corresponds to the palpable mass.  No other masses or other areas of architectural distortion. Bilateral diffuse punctate dystrophic appearing calcifications. No suspicious calcifications.  On physical exam, firm mass in right breast, upper outer quadrant, near the nipple, associated skin thickening.  Targeted right breast ultrasound is performed, showing a  hypoechoic mass with partly irregular and ill-defined margins at 9 o'clock, 2 cm the nipple, measuring 2.6 x 1.4 x 2.4 cm. A portion of the mass connects with the overlying thickened skin. Right axillary ultrasound shows no enlarged or abnormal nodes. No normal nodes are visualized.  IMPRESSION: 1. Highly suspicious, 2.6 mass in the right breast with overlying skin thickening.  RECOMMENDATION: 1. Ultrasound-guided core needle biopsy of the right breast mass. This procedure was scheduled prior to the patient being discharged from the [facility].  I have discussed the findings and recommendations with the patient. If applicable, a reminder letter will be sent to the patient regarding the next appointment.  BI-RADS CATEGORY 5: Highly suggestive of malignancy.   Pathology core needle biopsy: 08/27/2022 INVASIVE DUCTAL CARCINOMA, SEE NOTE TUBULE FORMATION: SCORE 3 NUCLEAR PLEOMORPHISM: SCORE 3 MITOTIC COUNT: SCORE 2 TOTAL SCORE: 8 OVERALL GRADE: 3 LYMPHOVASCULAR INVASION: NOT IDENTIFIED CANCER LENGTH: 1.4 CM CALCIFICATIONS: NOT IDENTIFIED  Receptors: Estrogen Receptor: 50%, POSITIVE, WEAK STAINING INTENSITY Progesterone Receptor: 0%, NEGATIVE Proliferation Marker Ki67: 90% The tumor cells are negative for Her2 (1+).  Review of Systems: A complete review of systems was obtained from the patient. I have reviewed this information and discussed as appropriate with the patient. See HPI as well for other ROS. ROS positive for depression and mild memory loss.  Medical History: Past Medical History: Diagnosis Date Coronary artery disease Diabetes mellitus without complication (CMS-HCC) GERD (gastroesophageal reflux disease) History of cancer Hx of breast cancer Hypertension Type 2 diabetes mellitus (CMS-HCC) Vision abnormalities  Patient Active Problem List Diagnosis Malignant neoplasm of upper-outer quadrant of right breast in female, estrogen receptor  positive History of right breast cancer History of MI (myocardial infarction) Controlled type 2 diabetes mellitus without  complication, with long-term current use of insulin (CMS-HCC)  Past Surgical History: Procedure Laterality Date biopsy surgery heart stent MASTECTOMY PARTIAL   Allergies Allergen Reactions Erythromycin Rash  Current Outpatient Medications on File Prior to Visit Medication Sig Dispense Refill ARIPiprazole (ABILIFY) 2 MG tablet Take by mouth citalopram (CELEXA) 40 MG tablet cyanocobalamin (VITAMIN B12) 1000 MCG tablet Take by mouth insulin DEGLUDEC (TRESIBA FLEXTOUCH U-200) pen injector (concentration 200 units/mL) Inject subcutaneously insulin LISPRO (HUMALOG KWIKPEN INSULIN) pen injector (concentration 100 units/mL) Inject subcutaneously meclizine (ANTIVERT) 25 mg tablet metoprolol tartrate (LOPRESSOR) 25 MG tablet TAKE 1/2 TABLET TWICE DAILY mirabegron (MYRBETRIQ) 50 mg ER tablet Take by mouth nitrofurantoin, macrocrystal-monohydrate, (MACROBID) 100 MG capsule omeprazole (PRILOSEC) 20 MG DR capsule Take by mouth ramipriL (ALTACE) 2.5 MG capsule Take by mouth  No current facility-administered medications on file prior to visit.  Family History Problem Relation Age of Onset Breast cancer Mother High blood pressure (Hypertension) Father Hyperlipidemia (Elevated cholesterol) Father Coronary Artery Disease (Blocked arteries around heart) Father Deep vein thrombosis (DVT or abnormal blood clot formation) Father   Social History  Tobacco Use Smoking Status Never Smokeless Tobacco Never   Social History  Socioeconomic History Marital status: Married Tobacco Use Smoking status: Never Smokeless tobacco: Never Substance and Sexual Activity Alcohol use: Yes Comment: SOCIAL Drug use: Never  Objective:  Vitals: BP: (!) 149/77 Pulse: 89 Temp: 36.1 C (97 F) SpO2: (!) 79% Weight: 71.7 kg (158 lb) Height: 170.2 cm ('5\' 7"'$ )  Body mass index is  24.75 kg/m.  Gen: No acute distress. Well nourished and well groomed. Neurological: Alert and oriented to person, place, and time. Coordination normal. Head: Normocephalic and atraumatic. Eyes: Conjunctivae are normal. Pupils are equal, round, and reactive to light. No scleral icterus. Neck: Normal range of motion. Neck supple. No tracheal deviation or thyromegaly present. Cardiovascular: Normal rate, regular rhythm, normal heart sounds and intact distal pulses. Exam reveals no gallop and no friction rub. No murmur heard. Breast: right breast smaller than left. There is contour deformity and skin involvement at 9 oclock. There is some right nipple retraction. The skin of the right axilla is indented consistent with ALND and radiation. There is no LAD palpable. Left breast is benign. Respiratory: Effort normal. No respiratory distress. No chest wall tenderness. Breath sounds normal. No wheezes, rales or rhonchi. GI: Soft. Bowel sounds are normal. The abdomen is soft and nontender. There is no rebound and no guarding. Musculoskeletal: Normal range of motion. Extremities are nontender. Lymphadenopathy: No cervical, preauricular, postauricular or axillary adenopathy is present Skin: Skin is warm and dry. No rash noted. No diaphoresis. No erythema. No pallor. No clubbing, cyanosis, or edema. Psychiatric: Normal mood and affect. Behavior is normal. Judgment and thought content normal.  Labs Istat performed pre io 07/20/2022 for urologic surgery showed Gluc 168, o/w normal.     Assessment and Plan:  ICD-10-CM 1. Malignant neoplasm of upper-outer quadrant of right breast in female, estrogen receptor positive C50.411 Ambulatory Referral to Oncology-Medical Z17.0 Ambulatory Referral to Radiation Oncology Ambulatory Referral to [facility]  2. History of right breast cancer Z85.3 Ambulatory Referral to Oncology-Medical Ambulatory Referral to Radiation Oncology Ambulatory Referral to  [facility]  3. History of MI (myocardial infarction) I25.2  4. Controlled type 2 diabetes mellitus without complication, with long-term current use of insulin (CMS-HCC) E11.9 Z79.4   Patient has a new diagnosis of clinical T2 NX right breast cancer. This tumor is only weakly ER positive and so likely  behaves like a triple negative tumor. I will refer her to medical oncology preoperatively to assess whether or not she will receive chemotherapy. She is 25 and so this discussion is important to note whether or not I would place a Port-A-Cath at the time of surgery. Also, they may recommend preoperative staging studies as she is without a axillary basin to drain any cancer that has escaped.  Given her previous radiation and surgery with this type of tumor I do recommend a mastectomy. In reviewing her case in the multidisciplinary fashion, radiation oncology stated that there may be things they can do with this most recent diagnosis. However, she would not be able to receive full whole breast radiation. I do not think we will be able to map given her previous axillary lymph node dissection.  We will send request for risk stratification to Dr. Einar Gip to see if she needs any preoperative cardiac testing.  Reviewed surgery with the patient and her friend. I reviewed the overnight stay, postoperative restrictions, drain placement.  I discussed the risks of bleeding, infection, wound breakdown, heart or lung complications, chronic pain, chronic swelling, possible death, blood clot.  She will be referred to genetics as well. She is not a candidate for up from reconstruction given prior radiation and her DM, but she declined referral to plastics for now.

## 2022-10-14 NOTE — Discharge Instructions (Signed)
[facility] [place] [phone]  MASTECTOMY: POST OP INSTRUCTIONS  Always review your discharge instruction sheet given to you by the facility where your surgery was performed. IF YOU HAVE DISABILITY OR FAMILY LEAVE FORMS, YOU MUST BRING THEM TO THE OFFICE FOR PROCESSING.   DO NOT GIVE THEM TO YOUR DOCTOR. A prescription for pain medication may be given to you upon discharge.  Take your pain medication as prescribed, if needed.  If narcotic pain medicine is not needed, then you may take acetaminophen (Tylenol) or ibuprofen (Advil) as needed. Take your usually prescribed medications unless otherwise directed. If you need a refill on your pain medication, please contact your pharmacy.  They will contact our office to request authorization.  Prescriptions will not be filled after 5pm or on week-ends. You should follow a light diet the first few days after arrival home, such as soup and crackers, etc.  Resume your normal diet the day after surgery. Most patients will experience some swelling and bruising on the chest and underarm.  Ice packs will help.  Swelling and bruising can take several days to resolve.  It is common to experience some constipation if taking pain medication after surgery.  Increasing fluid intake and taking a stool softener (such as Colace) will usually help or prevent this problem from occurring.  A mild laxative (Milk of Magnesia or Miralax) should be taken according to package instructions if there are no bowel movements after 48 hours. Unless discharge instructions indicate otherwise, leave your bandage dry and in place until your next appointment in 3-5 days.  You may take a limited sponge bath.  No tube baths or showers until the drains are removed.  You may have steri-strips (small skin tapes) in place directly over the incision.  These strips should be left on the skin for 7-10 days.  If your surgeon used skin glue on the incision, you may shower in 24 hours.   The glue will flake off over the next 2-3 weeks.  Any sutures or staples will be removed at the office during your follow-up visit. DRAINS:  If you have drains in place, it is important to keep a list of the amount of drainage produced each day in your drains.  Before leaving the hospital, you should be instructed on drain care.  Call our office if you have any questions about your drains. ACTIVITIES:  You may resume regular (light) daily activities beginning the next day--such as daily self-care, walking, climbing stairs--gradually increasing activities as tolerated.  You may have sexual intercourse when it is comfortable.  Refrain from any heavy lifting or straining until approved by your doctor. You may drive when you are no longer taking prescription pain medication, you can comfortably wear a seatbelt, and you can safely maneuver your car and apply brakes. RETURN TO WORK:  __________________________________________________________ You should see your doctor in the office for a follow-up appointment approximately 3-5 days after your surgery.  Your doctor's nurse will typically make your follow-up appointment when she calls you with your pathology report.  Expect your pathology report 2-3 business days after your surgery.  You may call to check if you do not hear from us after three days.   OTHER INSTRUCTIONS: ______________________________________________________________________________________________ ____________________________________________________________________________________________ WHEN TO CALL YOUR DOCTOR: Fever over 101.0 Nausea and/or vomiting Extreme swelling or bruising Continued bleeding from incision. Increased pain, redness, or drainage from the incision. The clinic staff is available to answer your questions during regular business hours.  Please don't hesitate   to call and ask to speak to one of the nurses for clinical concerns.  If you have a medical emergency, go to the  nearest emergency room or call 911.  A surgeon from [facility] is always on call at the hospital. [address], [address], [place], [place]  [address] ? [address] [address], [place], [place]   [address] [phone] ? [phone] ? FAX [phone] Web site: [email or web address]  

## 2022-10-14 NOTE — Transfer of Care (Signed)
Immediate Anesthesia Transfer of Care Note  Patient: Karina Foster  Procedure(s) Performed: RIGHT MASTECTOMY (Right) PORT PLACEMENT  Patient Location: PACU  Anesthesia Type:GA combined with regional for post-op pain  Level of Consciousness: awake, alert , oriented, and patient cooperative  Airway & Oxygen Therapy: Patient Spontanous Breathing and Patient connected to nasal cannula oxygen  Post-op Assessment: Report given to RN, Post -op Vital signs reviewed and stable, and Patient moving all extremities X 4  Post vital signs: Reviewed and stable  Last Vitals:  Vitals Value Taken Time  BP 138/66 10/14/22 1032  Temp    Pulse 94 10/14/22 1033  Resp 15 10/14/22 1033  SpO2 97 % 10/14/22 1033  Vitals shown include unvalidated device data.  Last Pain:  Vitals:   10/14/22 0627  TempSrc: Oral  PainSc:          Complications: No notable events documented.

## 2022-10-14 NOTE — Anesthesia Procedure Notes (Signed)
Anesthesia Regional Block: Pectoralis block   Pre-Anesthetic Checklist: , timeout performed,  Correct Patient, Correct Site, Correct Laterality,  Correct Procedure, Correct Position, site marked,  Risks and benefits discussed,  Surgical consent,  Pre-op evaluation,  At surgeon's request and post-op pain management  Laterality: Right  Prep: chloraprep       Needles:   Needle Type: Stimiplex     Needle Length: 9cm      Additional Needles:   Procedures:,,,, ultrasound used (permanent image in chart),,    Narrative:  Start time: 10/14/2022 7:03 AM End time: 10/14/2022 7:23 AM Injection made incrementally with aspirations every 5 mL.  Performed by: Personally  Anesthesiologist: Nolon Nations, MD  Additional Notes: BP cuff, SpO2 and EKG monitors applied. Sedation begun.  Anesthetic injected incrementally, slowly, and after neg aspirations under direct ultrasound guidance. Good fascial spread noted. Patient tolerated well.

## 2022-10-14 NOTE — Anesthesia Postprocedure Evaluation (Signed)
Anesthesia Post Note  Patient: Karina Foster  Procedure(s) Performed: RIGHT MASTECTOMY (Right) PORT PLACEMENT     Patient location during evaluation: PACU Anesthesia Type: General Level of consciousness: sedated and patient cooperative Pain management: pain level controlled Vital Signs Assessment: post-procedure vital signs reviewed and stable Respiratory status: spontaneous breathing Cardiovascular status: stable Anesthetic complications: no   No notable events documented.  Last Vitals:  Vitals:   10/14/22 1607 10/14/22 2017  BP: 114/65   Pulse: 87 87  Resp: 17 17  Temp: 36.9 C 36.9 C  SpO2: 95% 97%    Last Pain:  Vitals:   10/14/22 2017  TempSrc: Oral  PainSc:                  Nolon Nations

## 2022-10-14 NOTE — Interval H&P Note (Signed)
History and Physical Interval Note:  10/14/2022 7:43 AM  Karina Foster  has presented today for surgery, with the diagnosis of RIGHT BREAST CANCER.  The various methods of treatment have been discussed with the patient and family. After consideration of risks, benefits and other options for treatment, the patient has consented to  Procedure(s) with comments: RIGHT MASTECTOMY (Right) - PEC BLOCK PORT PLACEMENT WITH ULTRASOUND GUIDANCE (N/A) as a surgical intervention.  The patient's history has been reviewed, patient examined, no change in status, stable for surgery.  I have reviewed the patient's chart and labs.  Questions were answered to the patient's satisfaction.     Stark Klein

## 2022-10-15 ENCOUNTER — Encounter (HOSPITAL_COMMUNITY): Payer: Self-pay | Admitting: General Surgery

## 2022-10-15 DIAGNOSIS — I10 Essential (primary) hypertension: Secondary | ICD-10-CM | POA: Diagnosis not present

## 2022-10-15 DIAGNOSIS — Z803 Family history of malignant neoplasm of breast: Secondary | ICD-10-CM | POA: Diagnosis not present

## 2022-10-15 DIAGNOSIS — Z170 Estrogen receptor positive status [ER+]: Secondary | ICD-10-CM | POA: Diagnosis not present

## 2022-10-15 DIAGNOSIS — Z923 Personal history of irradiation: Secondary | ICD-10-CM | POA: Diagnosis not present

## 2022-10-15 DIAGNOSIS — Z9221 Personal history of antineoplastic chemotherapy: Secondary | ICD-10-CM | POA: Diagnosis not present

## 2022-10-15 DIAGNOSIS — C50411 Malignant neoplasm of upper-outer quadrant of right female breast: Secondary | ICD-10-CM | POA: Diagnosis not present

## 2022-10-15 LAB — BASIC METABOLIC PANEL
Anion gap: 11 (ref 5–15)
BUN: 17 mg/dL (ref 8–23)
CO2: 24 mmol/L (ref 22–32)
Calcium: 8.5 mg/dL — ABNORMAL LOW (ref 8.9–10.3)
Chloride: 96 mmol/L — ABNORMAL LOW (ref 98–111)
Creatinine, Ser: 0.94 mg/dL (ref 0.44–1.00)
GFR, Estimated: >60 mL/min (ref >60)
Glucose, Bld: 248 mg/dL — ABNORMAL HIGH (ref 70–99)
Potassium: 4.5 mmol/L (ref 3.5–5.1)
Sodium: 131 mmol/L — ABNORMAL LOW (ref 135–145)

## 2022-10-15 LAB — CBC
HCT: 32.1 % — ABNORMAL LOW (ref 36.0–46.0)
Hemoglobin: 10.7 g/dL — ABNORMAL LOW (ref 12.0–15.0)
MCH: 32 pg (ref 26.0–34.0)
MCHC: 33.3 g/dL (ref 30.0–36.0)
MCV: 96.1 fL (ref 80.0–100.0)
Platelets: 214 10*3/uL (ref 150–400)
RBC: 3.34 MIL/uL — ABNORMAL LOW (ref 3.87–5.11)
RDW: 13.8 % (ref 11.5–15.5)
WBC: 10.5 10*3/uL (ref 4.0–10.5)
nRBC: 0 % (ref 0.0–0.2)

## 2022-10-15 LAB — GLUCOSE, CAPILLARY: Glucose-Capillary: 204 mg/dL — ABNORMAL HIGH (ref 70–99)

## 2022-10-15 MED ORDER — TIZANIDINE HCL 2 MG PO TABS: 2.0000 mg | ORAL_TABLET | Freq: Three times a day (TID) | ORAL | 1 refills | Status: DC | PRN

## 2022-10-15 MED ORDER — ACETAMINOPHEN 500 MG PO TABS: 1000.0000 mg | ORAL_TABLET | Freq: Four times a day (QID) | ORAL | 0 refills | Status: DC

## 2022-10-15 MED ORDER — OXYCODONE HCL 5 MG PO TABS: 5.0000 mg | ORAL_TABLET | Freq: Four times a day (QID) | ORAL | 0 refills | Status: DC | PRN

## 2022-10-15 MED ORDER — SENNA 8.6 MG PO TABS: 1.0000 | ORAL_TABLET | Freq: Two times a day (BID) | ORAL | 0 refills | Status: DC

## 2022-10-15 NOTE — Discharge Summary (Signed)
Physician Discharge Summary  Patient ID: Karina Foster MRN: [id number] DOB/AGE: [birth year] 82 y.o.  Admit date: 10/14/2022 Discharge date: 10/15/2022  Admission Diagnoses: Patient Active Problem List   Diagnosis Date Noted   Breast cancer of upper-outer quadrant of right female breast ([place]) 10/14/2022   Hyperlipemia 10/14/2022   Malignant neoplasm of upper-outer quadrant of right breast in female, estrogen receptor positive ([place]) 09/15/2022   BPPV (benign paroxysmal positional vertigo) 05/01/2019  DM  Discharge Diagnoses:  Principal Problem:   Breast cancer of upper-outer quadrant of right female breast [facility]) And same as above  Discharged Condition: stable  Hospital Course:  Pt was admitted to the floor following right mastectomy and left  port placement for right breast cancer 10/14/2022.  She did well overnight, with minimal pain.  She was ambulatory and able to eat.  She was instructed on drain care.  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  She was discharged in stable condition.   Consults: None  Significant Diagnostic Studies: labs: HCT 32 AM of d/c.  Cr 0.94   Treatments: surgery: see above  Discharge Exam: Blood pressure 109/63, pulse 83, temperature 98 F (36.7 C), temperature source Oral, resp. rate 16, height 5' 7"$  (1.702 m), weight 71.4 kg, SpO2 98 %. General appearance: alert, cooperative, and no distress Resp: breathing comfortably Chest wall: anticipated right chest wall tenderness, no flap hematoma Drain serosang.   Disposition: Discharge disposition: 01-Home or Self Care       Discharge Instructions     Call MD for:  difficulty breathing, headache or visual disturbances   Complete by: As directed    Call MD for:  hives   Complete by: As directed    Call MD for:  persistant nausea and vomiting   Complete by: As directed    Call MD for:  redness, tenderness, or signs of infection (pain, swelling, redness, odor or  green/yellow discharge around incision site)   Complete by: As directed    Call MD for:  severe uncontrolled pain   Complete by: As directed    Call MD for:  temperature >100.4   Complete by: As directed    Change dressing (specify)   Complete by: As directed    Measure and record drain output at least twice daily. Bring record to clinic.   Diet - low sodium heart healthy   Complete by: As directed    Increase activity slowly   Complete by: As directed       Allergies as of 10/15/2022       Reactions   Azithromycin Rash   Demerol [meperidine] Diarrhea, Nausea And Vomiting        Medication List     TAKE these medications    acetaminophen 500 MG tablet Commonly known as: TYLENOL Take 2 tablets (1,000 mg total) by mouth every 6 (six) hours.   aspirin 325 MG tablet Take 325 mg by mouth daily.   BD Pen Needle Nano U/F 32G X 4 MM Misc Generic drug: Insulin Pen Needle SMARTSIG:2 Pen Needle SUB-Q Daily   citalopram 40 MG tablet Commonly known as: CELEXA Take 40 mg by mouth daily.   Cranberry Soft 500 MG Chew Chew 500 mg by mouth daily.   cyanocobalamin 1000 MCG tablet Commonly known as: VITAMIN B12 Take 1,000 mcg by mouth daily.   fluocinonide gel 0.05 % Commonly known as: LIDEX Apply 1 Application topically daily as needed (irritation on tongue).   FreeStyle [employer]  14 Day Sensor Misc Apply topically every 14 (fourteen) days.   HumaLOG KwikPen 100 UNIT/ML KwikPen Generic drug: insulin lispro Inject 30 Units into the skin in the morning, at noon, and at bedtime.   metoprolol tartrate 25 MG tablet Commonly known as: LOPRESSOR TAKE 1/2 TABLET TWICE DAILY   Nexlizet 180-10 MG Tabs Generic drug: Bempedoic Acid-Ezetimibe Take 1 tablet by mouth daily.   nitroGLYCERIN 0.4 MG SL tablet Commonly known as: NITROSTAT Place 0.4 mg under the tongue every 5 (five) minutes as needed for chest pain.   oxyCODONE 5 MG immediate release tablet Commonly known as: Oxy  IR/ROXICODONE Take 1 tablet (5 mg total) by mouth every 6 (six) hours as needed for moderate pain.   PreserVision AREDS Caps Take 1 capsule by mouth in the morning and at bedtime.   ramipril 2.5 MG capsule Commonly known as: ALTACE Take 2.5 mg by mouth daily.   senna 8.6 MG Tabs tablet Commonly known as: SENOKOT Take 1 tablet (8.6 mg total) by mouth 2 (two) times daily.   tiZANidine 2 MG tablet Commonly known as: ZANAFLEX Take 1 tablet (2 mg total) by mouth every 8 (eight) hours as needed for muscle spasms.   Tyler Aas FlexTouch 200 UNIT/ML FlexTouch Pen Generic drug: insulin degludec Inject 64 Units into the skin daily after lunch.               Discharge Care Instructions  (From admission, onward)           Start     Ordered   10/15/22 0000  Change dressing (specify)       Comments: Measure and record drain output at least twice daily. Bring record to clinic.   10/15/22 0850            Follow-up Information     Stark Klein, MD Follow up in 2 week(s).   Specialty: General Surgery Contact information: [address] [place] [address] [phone]                 Signed: Stark Klein 10/15/2022, 8:50 AM

## 2022-10-15 NOTE — Progress Notes (Signed)
Explained discharge instructions to patient. Reviewed follow up appointment and next medication administration times. Also reviewed post surgical and drain education. Patient verbalized having an understanding for instructions given. All belongings are in the patient's possession to include drain cups to measure her JP drainage. I No other needs verbalized. Transported downstairs for discharge.

## 2022-10-16 ENCOUNTER — Emergency Department (HOSPITAL_BASED_OUTPATIENT_CLINIC_OR_DEPARTMENT_OTHER)
Admission: EM | Admit: 2022-10-16 | Discharge: 2022-10-16 | Disposition: A | Discharge status: Home / Self Care | Payer: Medicare Other | Attending: Emergency Medicine | Admitting: Emergency Medicine

## 2022-10-16 ENCOUNTER — Other Ambulatory Visit: Payer: Self-pay

## 2022-10-16 ENCOUNTER — Encounter (HOSPITAL_BASED_OUTPATIENT_CLINIC_OR_DEPARTMENT_OTHER): Payer: Self-pay | Admitting: Emergency Medicine

## 2022-10-16 DIAGNOSIS — R109 Unspecified abdominal pain: Secondary | ICD-10-CM | POA: Diagnosis not present

## 2022-10-16 DIAGNOSIS — Z5321 Procedure and treatment not carried out due to patient leaving prior to being seen by health care provider: Secondary | ICD-10-CM | POA: Diagnosis not present

## 2022-10-16 DIAGNOSIS — K5900 Constipation, unspecified: Secondary | ICD-10-CM | POA: Insufficient documentation

## 2022-10-16 NOTE — ED Triage Notes (Signed)
  Patient comes in with constipation after surgery.  Patient states she had R mastectomy on Thursday and was given oxycodone for pain.  States she was struggling to have bowel movement even after taking ducolax at home.  Endorses abdominal cramping and sharp pains.  Pain 10/10, sharp/cramping.

## 2022-10-16 NOTE — ED Notes (Signed)
  Patient went to restroom and had large bowel movement.  Came out stating that she felt much better and did not want to be seen anymore.  Stated she would come back if needed.

## 2022-10-21 ENCOUNTER — Telehealth: Payer: Self-pay | Admitting: Pharmacy Technician

## 2022-10-21 NOTE — Telephone Encounter (Addendum)
Auth Submission: NO AUTH NEEDED Payer: MEDICARE A/B & AARP SUPP Medication & CPT/J Code(s) submitted: LEQVIO Route of submission (phone, fax, portal):  Phone # Fax # Auth type: Buy/Bill Units/visits requested: 2 Reference number:  Approval from: 10/21/22 to 10/22/23

## 2022-10-22 ENCOUNTER — Encounter: Payer: Self-pay | Admitting: *Deleted

## 2022-10-22 NOTE — Progress Notes (Signed)
Patient Care Team: [name], MD as PCP - General (Internal Medicine) [name], MD as Consulting Physician (Internal Medicine) Nicholas Lose, MD as Consulting Physician (Hematology and Oncology) Mauro Kaufmann, RN as Oncology Nurse Navigator Rockwell Germany, RN as Oncology Nurse Navigator  DIAGNOSIS: No diagnosis found.  SUMMARY OF ONCOLOGIC HISTORY: Oncology History  Malignant neoplasm of upper-outer quadrant of right breast in female, estrogen receptor positive ([place])  08/27/2022 Initial Diagnosis   Screening mammogram detected right breast mass by ultrasound measured 2.3 cm biopsy revealed grade 3 IDC ER 50%, PR 0%, Ki-67 90%, HER2 1+ negative     CHIEF COMPLIANT:   INTERVAL HISTORY: Karina Foster is a   ALLERGIES:  is allergic to azithromycin and demerol [meperidine].  MEDICATIONS:  Current Outpatient Medications  Medication Sig Dispense Refill   acetaminophen (TYLENOL) 500 MG tablet Take 2 tablets (1,000 mg total) by mouth every 6 (six) hours. 30 tablet 0   aspirin 325 MG tablet Take 325 mg by mouth daily.     BD PEN NEEDLE NANO U/F 32G X 4 MM MISC SMARTSIG:2 Pen Needle SUB-Q Daily     Bempedoic Acid-Ezetimibe (NEXLIZET) 180-10 MG TABS Take 1 tablet by mouth daily. 90 tablet 3   citalopram (CELEXA) 40 MG tablet Take 40 mg by mouth daily.     Continuous Blood Gluc Sensor (FREESTYLE LIBRE 14 DAY SENSOR) MISC Apply topically every 14 (fourteen) days.     Cranberry Soft 500 MG CHEW Chew 500 mg by mouth daily.     fluocinonide gel (LIDEX) [id number] % Apply 1 Application topically daily as needed (irritation on tongue).     HUMALOG KWIKPEN 100 UNIT/ML KwikPen Inject 30 Units into the skin in the morning, at noon, and at bedtime.     metoprolol tartrate (LOPRESSOR) 25 MG tablet TAKE 1/2 TABLET TWICE DAILY 180 tablet 3   Multiple Vitamins-Minerals (PRESERVISION AREDS) CAPS Take 1 capsule by mouth in the morning and at bedtime.     nitroGLYCERIN (NITROSTAT) 0.4 MG SL  tablet Place 0.4 mg under the tongue every 5 (five) minutes as needed for chest pain.     oxyCODONE (OXY IR/ROXICODONE) 5 MG immediate release tablet Take 1 tablet (5 mg total) by mouth every 6 (six) hours as needed for moderate pain. 15 tablet 0   ramipril (ALTACE) 2.5 MG capsule Take 2.5 mg by mouth daily.     senna (SENOKOT) 8.6 MG TABS tablet Take 1 tablet (8.6 mg total) by mouth 2 (two) times daily. 120 tablet 0   tiZANidine (ZANAFLEX) 2 MG tablet Take 1 tablet (2 mg total) by mouth every 8 (eight) hours as needed for muscle spasms. 20 tablet 1   TRESIBA FLEXTOUCH 200 UNIT/ML SOPN Inject 64 Units into the skin daily after lunch.     vitamin B-12 (CYANOCOBALAMIN) 1000 MCG tablet Take 1,000 mcg by mouth daily.     No current facility-administered medications for this visit.    PHYSICAL EXAMINATION: ECOG PERFORMANCE STATUS: {CHL ONC ECOG PS:[id number]}  There were no vitals filed for this visit. There were no vitals filed for this visit.  BREAST:*** No palpable masses or nodules in either right or left breasts. No palpable axillary supraclavicular or infraclavicular adenopathy no breast tenderness or nipple discharge. (exam performed in the presence of a chaperone)  LABORATORY DATA:  I have reviewed the data as listed    Latest Ref Rng & Units 10/15/2022    3:01 AM 10/08/2022   11:45 AM 07/20/2022  7:15 AM  CMP  Glucose 70 - 99 mg/dL 248  63  168   BUN 8 - 23 mg/dL 17  24  20   $ Creatinine 0.44 - 1.00 mg/dL 0.94  1.04  0.90   Sodium 135 - 145 mmol/L 131  136  138   Potassium 3.5 - 5.1 mmol/L 4.5  4.2  4.5   Chloride 98 - 111 mmol/L 96  100  104   CO2 22 - 32 mmol/L 24  24    Calcium 8.9 - 10.3 mg/dL 8.5  9.8      Lab Results  Component Value Date   WBC 10.5 10/15/2022   HGB 10.7 (L) 10/15/2022   HCT 32.1 (L) 10/15/2022   MCV 96.1 10/15/2022   PLT 214 10/15/2022   NEUTROABS 4.2 12/14/2008    ASSESSMENT & PLAN:  No problem-specific Assessment & Plan notes found for this  encounter.    No orders of the defined types were placed in this encounter.  The patient has a good understanding of the overall plan. she agrees with it. she will call with any problems that may develop before the next visit here. Total time spent: 30 mins including face to face time and time spent for planning, charting and co-ordination of care   Suzzette Righter, [place] 10/22/22    I Gardiner Coins am acting as a Education administrator for [employer]  ***

## 2022-10-25 ENCOUNTER — Inpatient Hospital Stay: Payer: Medicare Other | Attending: Hematology and Oncology | Admitting: Hematology and Oncology

## 2022-10-25 ENCOUNTER — Encounter: Payer: Self-pay | Admitting: *Deleted

## 2022-10-25 VITALS — BP 145/69 | HR 99 | Temp 97.5°F | Resp 16 | Ht 67.0 in | Wt 159.3 lb

## 2022-10-25 DIAGNOSIS — Z170 Estrogen receptor positive status [ER+]: Secondary | ICD-10-CM | POA: Diagnosis not present

## 2022-10-25 DIAGNOSIS — C50411 Malignant neoplasm of upper-outer quadrant of right female breast: Secondary | ICD-10-CM | POA: Diagnosis not present

## 2022-10-25 DIAGNOSIS — Z9011 Acquired absence of right breast and nipple: Secondary | ICD-10-CM | POA: Diagnosis not present

## 2022-10-25 MED ORDER — PROCHLORPERAZINE MALEATE 10 MG PO TABS: 10.0000 mg | ORAL_TABLET | Freq: Four times a day (QID) | ORAL | 1 refills | Status: DC | PRN

## 2022-10-25 MED ORDER — LIDOCAINE-PRILOCAINE 2.5-2.5 % EX CREA: TOPICAL_CREAM | CUTANEOUS | 3 refills | Status: DC

## 2022-10-25 MED ORDER — ONDANSETRON HCL 8 MG PO TABS: 8.0000 mg | ORAL_TABLET | Freq: Three times a day (TID) | ORAL | 1 refills | Status: DC | PRN

## 2022-10-25 NOTE — Assessment & Plan Note (Signed)
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 I discussed the margins as well as lymph node surgeries. We also discussed the final staging along with previously performed ER/PR and HER-2/neu testing.  Treatment plan: adjuvant chemotherapy with CMF x 6 cycles  2. Adjuvant antiestrogen therapy (will repeat prognostic panel on the final path and determine if she would benefit from antiestrogen therapy)  Return to clinic in 3 weeks to start chemotherapy

## 2022-10-25 NOTE — Progress Notes (Signed)
START OFF PATHWAY REGIMEN - Breast   [id number]:CMF (Cyclophosphamide IV + Methotrexate IV + Fluorouracil IV) q21 Days:   A cycle is every 21 days:     Cyclophosphamide      Fluorouracil      Methotrexate   **Always confirm dose/schedule in your pharmacy ordering system**  Patient Characteristics: Postoperative without Neoadjuvant Therapy (Pathologic Staging), Invasive Disease, Adjuvant Therapy, HER2 Negative, ER Positive, Node Negative, pT1a, pN25m or pT1b-c, pN0/N145mor pT2 or Higher, pN0, Genomic Testing Not Performed, Chemotherapy Preferred Therapeutic Status: Postoperative without Neoadjuvant Therapy (Pathologic Staging) AJCC Grade: G3 AJCC N Category: pN0 AJCC M Category: cM0 ER Status: Positive (+) AJCC 8 Stage Grouping: IIA HER2 Status: Negative (-) Oncotype Dx Recurrence Score: Not Appropriate AJCC T Category: pT2 PR Status: Negative (-) Has this patient completed genomic testing<= No - Did Not Order Test  Treatment Preferred: Chemotherapy Intent of Therapy: Curative Intent, Discussed with Patient

## 2022-10-26 ENCOUNTER — Telehealth: Payer: Self-pay | Admitting: Hematology and Oncology

## 2022-10-26 ENCOUNTER — Other Ambulatory Visit: Payer: Self-pay

## 2022-10-26 LAB — SURGICAL PATHOLOGY

## 2022-10-26 NOTE — Telephone Encounter (Signed)
Scheduled appointments per WQ. Left voicemail. During the end of the call, the phone was disconnected.

## 2022-10-26 NOTE — Telephone Encounter (Signed)
Received call from [place] with questions regarding interaction between celexa and ondansetron. Per MD, we will d/c ondansetron and see how she tolerates Compazine.

## 2022-10-28 ENCOUNTER — Encounter: Payer: Self-pay | Admitting: *Deleted

## 2022-11-05 NOTE — Progress Notes (Signed)
Pharmacist Chemotherapy Monitoring - Initial Assessment    Anticipated start date: 11/12/2022   The following has been reviewed per standard work regarding the patient's treatment regimen: The patient's diagnosis, treatment plan and drug doses, and organ/hematologic function Lab orders and baseline tests specific to treatment regimen  The treatment plan start date, drug sequencing, and pre-medications Prior authorization status  Patient's documented medication list, including drug-drug interaction screen and prescriptions for anti-emetics and supportive care specific to the treatment regimen The drug concentrations, fluid compatibility, administration routes, and timing of the medications to be used The patient's access for treatment and lifetime cumulative dose history, if applicable  The patient's medication allergies and previous infusion related reactions, if applicable   Changes made to treatment plan:  N/A  Follow up needed:  N/A   Philomena Course, [facility], 11/05/2022  2:42 PM

## 2022-11-08 ENCOUNTER — Telehealth: Payer: Self-pay | Admitting: Hematology and Oncology

## 2022-11-08 ENCOUNTER — Encounter: Payer: Medicare Other | Admitting: Licensed Clinical Social Worker

## 2022-11-08 ENCOUNTER — Other Ambulatory Visit: Payer: Medicare Other

## 2022-11-08 ENCOUNTER — Inpatient Hospital Stay: Payer: Medicare Other | Admitting: Pharmacist

## 2022-11-08 ENCOUNTER — Inpatient Hospital Stay: Payer: Medicare Other

## 2022-11-08 NOTE — Telephone Encounter (Signed)
Rescheduled and cancelled appointments due to patient no show to appointments. Did explain in the voicemail that the education class needs to be completed before the patient can start chemo on Friday 3/8 starting at 10:15. Education class was rescheduled for Thursday 3/7 at 4p. Left voicemail.

## 2022-11-09 NOTE — Progress Notes (Signed)
Patient Care Team: [name], MD as PCP - General (Internal Medicine) [name], MD as Consulting Physician (Internal Medicine) Nicholas Lose, MD as Consulting Physician (Hematology and Oncology) Mauro Kaufmann, RN as Oncology Nurse Navigator Rockwell Germany, RN as Oncology Nurse Navigator  DIAGNOSIS:  Encounter Diagnosis  Name Primary?   Malignant neoplasm of upper-outer quadrant of right breast in female, estrogen receptor positive ([place]) Yes    SUMMARY OF ONCOLOGIC HISTORY: Oncology History  Malignant neoplasm of upper-outer quadrant of right breast in female, estrogen receptor positive ([place])  08/27/2022 Initial Diagnosis   Screening mammogram detected right breast mass by ultrasound measured 2.3 cm biopsy revealed grade 3 IDC ER 50%, PR 0%, Ki-67 90%, HER2 1+ negative   10/14/2022 Surgery   Right mastectomy: High-grade IDC 3.4 cm with focal sarcomatoid features (metaplastic) 3.4 cm grade 3 with DCIS, lymphovascular invasion present, focal perineural invasion present, margins negative ER 50%, PR 0%, HER2 1+   11/12/2022 -  Chemotherapy   Patient is on Treatment Plan : BREAST Adjuvant CMF IV q21d       CHIEF COMPLIANT: Follow up cycle 1 Day 1 CMF  INTERVAL HISTORY: Karina Foster is a 82 y.o. female is here because of recent diagnosis of right breast cancer. She presents to the clinic for a follow-up. She reports that she does drink a lot of water due to bladder issues. She does go to the bathroom frequently.   ALLERGIES:  is allergic to azithromycin and demerol [meperidine].  MEDICATIONS:  Current Outpatient Medications  Medication Sig Dispense Refill   acetaminophen (TYLENOL) 500 MG tablet Take 2 tablets (1,000 mg total) by mouth every 6 (six) hours. 30 tablet 0   aspirin 325 MG tablet Take 325 mg by mouth daily.     BD PEN NEEDLE NANO U/F 32G X 4 MM MISC SMARTSIG:2 Pen Needle SUB-Q Daily     Bempedoic Acid-Ezetimibe (NEXLIZET) 180-10 MG TABS Take 1 tablet  by mouth daily. 90 tablet 3   citalopram (CELEXA) 40 MG tablet Take 40 mg by mouth daily.     Continuous Blood Gluc Sensor (FREESTYLE LIBRE 14 DAY SENSOR) MISC Apply topically every 14 (fourteen) days.     Cranberry Soft 500 MG CHEW Chew 500 mg by mouth daily.     fluocinonide gel (LIDEX) [id number] % Apply 1 Application topically daily as needed (irritation on tongue).     HUMALOG KWIKPEN 100 UNIT/ML KwikPen Inject 30 Units into the skin in the morning, at noon, and at bedtime.     lidocaine-prilocaine (EMLA) cream Apply to affected area once 30 g 3   metoprolol tartrate (LOPRESSOR) 25 MG tablet TAKE 1/2 TABLET TWICE DAILY 180 tablet 3   Multiple Vitamins-Minerals (PRESERVISION AREDS) CAPS Take 1 capsule by mouth in the morning and at bedtime.     nitroGLYCERIN (NITROSTAT) 0.4 MG SL tablet Place 0.4 mg under the tongue every 5 (five) minutes as needed for chest pain.     prochlorperazine (COMPAZINE) 10 MG tablet Take 1 tablet (10 mg total) by mouth every 6 (six) hours as needed for nausea or vomiting. 30 tablet 1   ramipril (ALTACE) 2.5 MG capsule Take 2.5 mg by mouth daily.     tiZANidine (ZANAFLEX) 2 MG tablet Take 1 tablet (2 mg total) by mouth every 8 (eight) hours as needed for muscle spasms. 20 tablet 1   TRESIBA FLEXTOUCH 200 UNIT/ML SOPN Inject 64 Units into the skin daily after lunch.     vitamin  B-12 (CYANOCOBALAMIN) 1000 MCG tablet Take 1,000 mcg by mouth daily.     No current facility-administered medications for this visit.    PHYSICAL EXAMINATION: ECOG PERFORMANCE STATUS: 1 - Symptomatic but completely ambulatory  Vitals:   11/12/22 1037  BP: (!) 158/65  Pulse: 93  Resp: 18  Temp: (!) 97.4 F (36.3 C)  SpO2: 97%   Filed Weights   11/12/22 1037  Weight: 161 lb (73 kg)      LABORATORY DATA:  I have reviewed the data as listed    Latest Ref Rng & Units 10/15/2022    3:01 AM 10/08/2022   11:45 AM 07/20/2022    7:15 AM  CMP  Glucose 70 - 99 mg/dL 248  63  168   BUN 8 -  23 mg/dL '17  24  20   '$ Creatinine 0.44 - 1.00 mg/dL 0.94  1.04  0.90   Sodium 135 - 145 mmol/L 131  136  138   Potassium 3.5 - 5.1 mmol/L 4.5  4.2  4.5   Chloride 98 - 111 mmol/L 96  100  104   CO2 22 - 32 mmol/L 24  24    Calcium 8.9 - 10.3 mg/dL 8.5  9.8      Lab Results  Component Value Date   WBC 5.9 11/12/2022   HGB 12.5 11/12/2022   HCT 36.7 11/12/2022   MCV 95.6 11/12/2022   PLT 258 11/12/2022   NEUTROABS 2.8 11/12/2022    ASSESSMENT & PLAN:  Malignant neoplasm of upper-outer quadrant of right breast in female, estrogen receptor positive ([place]) 10/14/2022:Right mastectomy: High-grade IDC 3.4 cm with focal sarcomatoid features (metaplastic) 3.4 cm grade 3 with DCIS, lymphovascular invasion present, focal perineural invasion present, margins negative ER 0%, PR 0%, HER2 1+ (repeat prognostic panel was triple negative)  Treatment plan: adjuvant chemotherapy with CMF x 6 cycles to start 11/12/2022 2. no role of antiestrogen therapy since her repeat prognostic panel was triple negative -------------------------------------------------------------------------------------------------------------------------------------------- Current treatment: Cycle 1 CMF Labs reviewed, antiemetics were discussed, chemo consent obtained, chemo education completed Return to clinic in 1 week for toxicity check    No orders of the defined types were placed in this encounter.  The patient has a good understanding of the overall plan. she agrees with it. she will call with any problems that may develop before the next visit here. Total time spent: 30 mins including face to face time and time spent for planning, charting and co-ordination of care   Harriette Ohara, MD 11/12/22    I Gardiner Coins am acting as a Education administrator for [employer]  I have reviewed the above documentation for accuracy and completeness, and I agree with the above.

## 2022-11-11 ENCOUNTER — Telehealth: Payer: Self-pay | Admitting: Hematology and Oncology

## 2022-11-11 ENCOUNTER — Inpatient Hospital Stay: Payer: Medicare Other

## 2022-11-11 MED FILL — Fosaprepitant Dimeglumine For IV Infusion 150 MG (Base Eq): INTRAVENOUS | Qty: 5 | Status: AC

## 2022-11-11 MED FILL — Dexamethasone Sodium Phosphate Inj 100 MG/10ML: INTRAMUSCULAR | Qty: 1 | Status: AC

## 2022-11-11 NOTE — Telephone Encounter (Signed)
Cancelled appointment per patient and was ok'ed by Dr.Gudena. Patient does not need chemo education. Patient is aware of the cancelled appointment.

## 2022-11-12 ENCOUNTER — Inpatient Hospital Stay: Payer: Medicare Other | Attending: Hematology and Oncology

## 2022-11-12 ENCOUNTER — Inpatient Hospital Stay: Payer: Medicare Other

## 2022-11-12 ENCOUNTER — Inpatient Hospital Stay (HOSPITAL_BASED_OUTPATIENT_CLINIC_OR_DEPARTMENT_OTHER): Payer: Medicare Other | Admitting: Hematology and Oncology

## 2022-11-12 VITALS — BP 132/56 | HR 88 | Temp 98.5°F | Resp 17

## 2022-11-12 VITALS — BP 158/65 | HR 93 | Temp 97.4°F | Resp 18 | Ht 67.0 in | Wt 161.0 lb

## 2022-11-12 DIAGNOSIS — Z9011 Acquired absence of right breast and nipple: Secondary | ICD-10-CM | POA: Diagnosis not present

## 2022-11-12 DIAGNOSIS — Z170 Estrogen receptor positive status [ER+]: Secondary | ICD-10-CM

## 2022-11-12 DIAGNOSIS — Z803 Family history of malignant neoplasm of breast: Secondary | ICD-10-CM | POA: Diagnosis not present

## 2022-11-12 DIAGNOSIS — G4733 Obstructive sleep apnea (adult) (pediatric): Secondary | ICD-10-CM | POA: Diagnosis not present

## 2022-11-12 DIAGNOSIS — Z923 Personal history of irradiation: Secondary | ICD-10-CM | POA: Insufficient documentation

## 2022-11-12 DIAGNOSIS — I2510 Atherosclerotic heart disease of native coronary artery without angina pectoris: Secondary | ICD-10-CM | POA: Diagnosis not present

## 2022-11-12 DIAGNOSIS — D701 Agranulocytosis secondary to cancer chemotherapy: Secondary | ICD-10-CM | POA: Insufficient documentation

## 2022-11-12 DIAGNOSIS — Z8041 Family history of malignant neoplasm of ovary: Secondary | ICD-10-CM | POA: Diagnosis not present

## 2022-11-12 DIAGNOSIS — Z955 Presence of coronary angioplasty implant and graft: Secondary | ICD-10-CM | POA: Insufficient documentation

## 2022-11-12 DIAGNOSIS — Z87891 Personal history of nicotine dependence: Secondary | ICD-10-CM | POA: Diagnosis not present

## 2022-11-12 DIAGNOSIS — Z5111 Encounter for antineoplastic chemotherapy: Secondary | ICD-10-CM | POA: Insufficient documentation

## 2022-11-12 DIAGNOSIS — I252 Old myocardial infarction: Secondary | ICD-10-CM | POA: Diagnosis not present

## 2022-11-12 DIAGNOSIS — C50411 Malignant neoplasm of upper-outer quadrant of right female breast: Secondary | ICD-10-CM

## 2022-11-12 DIAGNOSIS — T451X5A Adverse effect of antineoplastic and immunosuppressive drugs, initial encounter: Secondary | ICD-10-CM | POA: Insufficient documentation

## 2022-11-12 DIAGNOSIS — D6481 Anemia due to antineoplastic chemotherapy: Secondary | ICD-10-CM | POA: Diagnosis not present

## 2022-11-12 DIAGNOSIS — Z853 Personal history of malignant neoplasm of breast: Secondary | ICD-10-CM | POA: Insufficient documentation

## 2022-11-12 DIAGNOSIS — Z9221 Personal history of antineoplastic chemotherapy: Secondary | ICD-10-CM | POA: Diagnosis not present

## 2022-11-12 DIAGNOSIS — R5383 Other fatigue: Secondary | ICD-10-CM | POA: Diagnosis not present

## 2022-11-12 DIAGNOSIS — K1231 Oral mucositis (ulcerative) due to antineoplastic therapy: Secondary | ICD-10-CM | POA: Insufficient documentation

## 2022-11-12 LAB — CMP (CANCER CENTER ONLY)
ALT: 23 U/L (ref 0–44)
AST: 22 U/L (ref 15–41)
Albumin: 4.3 g/dL (ref 3.5–5.0)
Alkaline Phosphatase: 41 U/L (ref 38–126)
Anion gap: 8 (ref 5–15)
BUN: 19 mg/dL (ref 8–23)
CO2: 27 mmol/L (ref 22–32)
Calcium: 9.2 mg/dL (ref 8.9–10.3)
Chloride: 105 mmol/L (ref 98–111)
Creatinine: 0.95 mg/dL (ref 0.44–1.00)
GFR, Estimated: >60 mL/min (ref >60)
Glucose, Bld: 97 mg/dL (ref 70–99)
Potassium: 4.1 mmol/L (ref 3.5–5.1)
Sodium: 140 mmol/L (ref 135–145)
Total Bilirubin: 0.4 mg/dL (ref 0.3–1.2)
Total Protein: 7.4 g/dL (ref 6.5–8.1)

## 2022-11-12 LAB — CBC WITH DIFFERENTIAL (CANCER CENTER ONLY)
Abs Immature Granulocytes: 0.02 10*3/uL (ref 0.00–0.07)
Basophils Absolute: 0 10*3/uL (ref 0.0–0.1)
Basophils Relative: 1 %
Eosinophils Absolute: 0.4 10*3/uL (ref 0.0–0.5)
Eosinophils Relative: 8 %
HCT: 36.7 % (ref 36.0–46.0)
Hemoglobin: 12.5 g/dL (ref 12.0–15.0)
Immature Granulocytes: 0 %
Lymphocytes Relative: 30 %
Lymphs Abs: 1.8 10*3/uL (ref 0.7–4.0)
MCH: 32.6 pg (ref 26.0–34.0)
MCHC: 34.1 g/dL (ref 30.0–36.0)
MCV: 95.6 fL (ref 80.0–100.0)
Monocytes Absolute: 0.8 10*3/uL (ref 0.1–1.0)
Monocytes Relative: 14 %
Neutro Abs: 2.8 10*3/uL (ref 1.7–7.7)
Neutrophils Relative %: 47 %
Platelet Count: 258 10*3/uL (ref 150–400)
RBC: 3.84 MIL/uL — ABNORMAL LOW (ref 3.87–5.11)
RDW: 14.6 % (ref 11.5–15.5)
WBC Count: 5.9 10*3/uL (ref 4.0–10.5)
nRBC: 0 % (ref 0.0–0.2)

## 2022-11-12 MED ORDER — SODIUM CHLORIDE 0.9 % IV SOLN: Freq: Once | INTRAVENOUS | Status: AC

## 2022-11-12 MED ORDER — FLUOROURACIL CHEMO INJECTION 2.5 GM/50ML
600.0000 mg/m2 | Freq: Once | INTRAVENOUS | Status: AC
  Administered 2022-11-12: 1100 mg via INTRAVENOUS
  Filled 2022-11-12: qty 22

## 2022-11-12 MED ORDER — SODIUM CHLORIDE 0.9 % IV SOLN
150.0000 mg | Freq: Once | INTRAVENOUS | Status: AC
  Administered 2022-11-12: 150 mg via INTRAVENOUS
  Filled 2022-11-12: qty 150

## 2022-11-12 MED ORDER — SODIUM CHLORIDE 0.9 % IV SOLN
600.0000 mg/m2 | Freq: Once | INTRAVENOUS | Status: AC
  Administered 2022-11-12: 1120 mg via INTRAVENOUS
  Filled 2022-11-12: qty 55.46

## 2022-11-12 MED ORDER — PALONOSETRON HCL INJECTION 0.25 MG/5ML
0.2500 mg | Freq: Once | INTRAVENOUS | Status: AC
  Administered 2022-11-12: 0.25 mg via INTRAVENOUS
  Filled 2022-11-12: qty 5

## 2022-11-12 MED ORDER — HEPARIN SOD (PORK) LOCK FLUSH 100 UNIT/ML IV SOLN
500.0000 [IU] | Freq: Once | INTRAVENOUS | Status: AC | PRN
  Administered 2022-11-12: 500 [IU]

## 2022-11-12 MED ORDER — METHOTREXATE SODIUM CHEMO INJECTION (PF) 50 MG/2ML
40.0000 mg/m2 | Freq: Once | INTRAMUSCULAR | Status: AC
  Administered 2022-11-12: 74 mg via INTRAVENOUS
  Filled 2022-11-12: qty 2.96

## 2022-11-12 MED ORDER — SODIUM CHLORIDE 0.9 % IV SOLN
10.0000 mg | Freq: Once | INTRAVENOUS | Status: AC
  Administered 2022-11-12: 10 mg via INTRAVENOUS
  Filled 2022-11-12: qty 10

## 2022-11-12 MED ORDER — SODIUM CHLORIDE 0.9% FLUSH
10.0000 mL | INTRAVENOUS | Status: DC | PRN
  Administered 2022-11-12: 10 mL via INTRAVENOUS

## 2022-11-12 MED ORDER — SODIUM CHLORIDE 0.9% FLUSH
10.0000 mL | INTRAVENOUS | Status: DC | PRN
  Administered 2022-11-12: 10 mL

## 2022-11-12 NOTE — Progress Notes (Signed)
Per Dr Lindi Adie, ok to proceed with fluids and premeds while awaiting CMP results.

## 2022-11-12 NOTE — Assessment & Plan Note (Signed)
10/14/2022:Right mastectomy: High-grade IDC 3.4 cm with focal sarcomatoid features (metaplastic) 3.4 cm grade 3 with DCIS, lymphovascular invasion present, focal perineural invasion present, margins negative ER 0%, PR 0%, HER2 1+ (repeat prognostic panel was triple negative)  Treatment plan: adjuvant chemotherapy with CMF x 6 cycles to start 11/12/2022 2. no role of antiestrogen therapy since her repeat prognostic panel was triple negative -------------------------------------------------------------------------------------------------------------------------------------------- Current treatment: Cycle 1 CMF Labs reviewed, antiemetics were discussed, chemo consent obtained, chemo education completed Return to clinic in 1 week for toxicity check

## 2022-11-12 NOTE — Patient Instructions (Signed)
[facility]  Discharge Instructions: Thank you for choosing [facility] to provide your oncology and hematology care.   If you have a lab appointment with the [facility], please go directly to the [facility] and check in at the registration area.   Wear comfortable clothing and clothing appropriate for easy access to any Portacath or PICC line.   We strive to give you quality time with your provider. You may need to reschedule your appointment if you arrive late (15 or more minutes).  Arriving late affects you and other patients whose appointments are after yours.  Also, if you miss three or more appointments without notifying the office, you may be dismissed from the clinic at the provider's discretion.      For prescription refill requests, have your pharmacy contact our office and allow 72 hours for refills to be completed.    Today you received the following chemotherapy and/or immunotherapy agents Cytoxan, Methotrexate, Adrucil   To help prevent nausea and vomiting after your treatment, we encourage you to take your nausea medication as directed.  BELOW ARE SYMPTOMS THAT SHOULD BE REPORTED IMMEDIATELY: *FEVER GREATER THAN 100.4 F (38 C) OR HIGHER *CHILLS OR SWEATING *NAUSEA AND VOMITING THAT IS NOT CONTROLLED WITH YOUR NAUSEA MEDICATION *UNUSUAL SHORTNESS OF BREATH *UNUSUAL BRUISING OR BLEEDING *URINARY PROBLEMS (pain or burning when urinating, or frequent urination) *BOWEL PROBLEMS (unusual diarrhea, constipation, pain near the anus) TENDERNESS IN MOUTH AND THROAT WITH OR WITHOUT PRESENCE OF ULCERS (sore throat, sores in mouth, or a toothache) UNUSUAL RASH, SWELLING OR PAIN  UNUSUAL VAGINAL DISCHARGE OR ITCHING   Items with * indicate a potential emergency and should be followed up as soon as possible or go to the Emergency Department if any problems should occur.  Please show the CHEMOTHERAPY ALERT CARD or IMMUNOTHERAPY  ALERT CARD at check-in to the Emergency Department and triage nurse.  Should you have questions after your visit or need to cancel or reschedule your appointment, please contact [facility]  Dept: [phone]  and follow the prompts.  Office hours are 8:00 a.m. to 4:30 p.m. Monday - Friday. Please note that voicemails left after 4:00 p.m. may not be returned until the following business day.  We are closed weekends and major holidays. You have access to a nurse at all times for urgent questions. Please call the main number to the clinic Dept: [phone] and follow the prompts.   For any non-urgent questions, you may also contact your provider using MyChart. We now offer e-Visits for anyone 76 and older to request care online for non-urgent symptoms. For details visit [email or web address].   Also download the MyChart app! Go to the app store, search "MyChart", open the app, select [facility], and log in with your MyChart username and password.

## 2022-11-15 ENCOUNTER — Telehealth: Payer: Self-pay | Admitting: *Deleted

## 2022-11-15 NOTE — Telephone Encounter (Signed)
-----   Message from Casandra Doffing, RN sent at 11/12/2022  2:23 PM EST ----- Regarding: Dr Lindi Adie CMF first treatment Dr Lindi Adie 1st time CMF pt. Pt tolerated tx well without incident. Pt due for callback.

## 2022-11-15 NOTE — Telephone Encounter (Signed)
Called pt to see how she did with her recent treatment & she reports doing well.  She denies any major problems.  She is eating & drinking fluids ok & states she is doing better than she thought.  She did have some constipation but took ducolax orally & this has helped.  We discussed making sure she at least used a stool softener before starting her next treatment.  She knows her next appt & how to reach [place] if needed.

## 2022-11-17 ENCOUNTER — Other Ambulatory Visit: Payer: Self-pay | Admitting: *Deleted

## 2022-11-17 DIAGNOSIS — C50411 Malignant neoplasm of upper-outer quadrant of right female breast: Secondary | ICD-10-CM

## 2022-11-19 ENCOUNTER — Encounter: Payer: Self-pay | Admitting: Adult Health

## 2022-11-19 ENCOUNTER — Other Ambulatory Visit: Payer: Self-pay

## 2022-11-19 ENCOUNTER — Other Ambulatory Visit (HOSPITAL_BASED_OUTPATIENT_CLINIC_OR_DEPARTMENT_OTHER): Payer: Self-pay

## 2022-11-19 ENCOUNTER — Inpatient Hospital Stay: Payer: Medicare Other

## 2022-11-19 ENCOUNTER — Other Ambulatory Visit: Payer: Self-pay | Admitting: *Deleted

## 2022-11-19 ENCOUNTER — Encounter: Payer: Self-pay | Admitting: *Deleted

## 2022-11-19 ENCOUNTER — Inpatient Hospital Stay (HOSPITAL_BASED_OUTPATIENT_CLINIC_OR_DEPARTMENT_OTHER): Payer: Medicare Other | Admitting: Adult Health

## 2022-11-19 VITALS — BP 159/63 | HR 102 | Temp 98.1°F | Resp 17 | Wt 155.0 lb

## 2022-11-19 DIAGNOSIS — N61 Mastitis without abscess: Secondary | ICD-10-CM | POA: Diagnosis present

## 2022-11-19 DIAGNOSIS — K8020 Calculus of gallbladder without cholecystitis without obstruction: Secondary | ICD-10-CM | POA: Diagnosis present

## 2022-11-19 DIAGNOSIS — K760 Fatty (change of) liver, not elsewhere classified: Secondary | ICD-10-CM | POA: Diagnosis not present

## 2022-11-19 DIAGNOSIS — C50411 Malignant neoplasm of upper-outer quadrant of right female breast: Secondary | ICD-10-CM | POA: Diagnosis not present

## 2022-11-19 DIAGNOSIS — R110 Nausea: Secondary | ICD-10-CM | POA: Diagnosis not present

## 2022-11-19 DIAGNOSIS — K1231 Oral mucositis (ulcerative) due to antineoplastic therapy: Secondary | ICD-10-CM | POA: Diagnosis not present

## 2022-11-19 DIAGNOSIS — Z170 Estrogen receptor positive status [ER+]: Secondary | ICD-10-CM

## 2022-11-19 DIAGNOSIS — R112 Nausea with vomiting, unspecified: Secondary | ICD-10-CM | POA: Diagnosis not present

## 2022-11-19 DIAGNOSIS — Z1152 Encounter for screening for COVID-19: Secondary | ICD-10-CM | POA: Diagnosis not present

## 2022-11-19 DIAGNOSIS — I700 Atherosclerosis of aorta: Secondary | ICD-10-CM | POA: Diagnosis present

## 2022-11-19 DIAGNOSIS — R509 Fever, unspecified: Secondary | ICD-10-CM | POA: Diagnosis not present

## 2022-11-19 DIAGNOSIS — D701 Agranulocytosis secondary to cancer chemotherapy: Secondary | ICD-10-CM | POA: Diagnosis present

## 2022-11-19 DIAGNOSIS — K6389 Other specified diseases of intestine: Secondary | ICD-10-CM | POA: Diagnosis not present

## 2022-11-19 DIAGNOSIS — N390 Urinary tract infection, site not specified: Secondary | ICD-10-CM | POA: Diagnosis present

## 2022-11-19 DIAGNOSIS — G4733 Obstructive sleep apnea (adult) (pediatric): Secondary | ICD-10-CM | POA: Diagnosis present

## 2022-11-19 DIAGNOSIS — N3000 Acute cystitis without hematuria: Secondary | ICD-10-CM | POA: Diagnosis not present

## 2022-11-19 DIAGNOSIS — N200 Calculus of kidney: Secondary | ICD-10-CM | POA: Diagnosis not present

## 2022-11-19 DIAGNOSIS — D709 Neutropenia, unspecified: Secondary | ICD-10-CM | POA: Diagnosis not present

## 2022-11-19 DIAGNOSIS — T8131XA Disruption of external operation (surgical) wound, not elsewhere classified, initial encounter: Secondary | ICD-10-CM | POA: Diagnosis present

## 2022-11-19 DIAGNOSIS — F29 Unspecified psychosis not due to a substance or known physiological condition: Secondary | ICD-10-CM | POA: Diagnosis not present

## 2022-11-19 DIAGNOSIS — Z95828 Presence of other vascular implants and grafts: Secondary | ICD-10-CM | POA: Insufficient documentation

## 2022-11-19 DIAGNOSIS — F32A Depression, unspecified: Secondary | ICD-10-CM | POA: Diagnosis present

## 2022-11-19 DIAGNOSIS — R5081 Fever presenting with conditions classified elsewhere: Secondary | ICD-10-CM | POA: Diagnosis not present

## 2022-11-19 DIAGNOSIS — E876 Hypokalemia: Secondary | ICD-10-CM | POA: Diagnosis not present

## 2022-11-19 DIAGNOSIS — A419 Sepsis, unspecified organism: Secondary | ICD-10-CM | POA: Diagnosis not present

## 2022-11-19 DIAGNOSIS — I2510 Atherosclerotic heart disease of native coronary artery without angina pectoris: Secondary | ICD-10-CM | POA: Diagnosis present

## 2022-11-19 DIAGNOSIS — G620 Drug-induced polyneuropathy: Secondary | ICD-10-CM | POA: Diagnosis present

## 2022-11-19 DIAGNOSIS — T8144XA Sepsis following a procedure, initial encounter: Secondary | ICD-10-CM | POA: Diagnosis present

## 2022-11-19 DIAGNOSIS — C50919 Malignant neoplasm of unspecified site of unspecified female breast: Secondary | ICD-10-CM | POA: Diagnosis not present

## 2022-11-19 DIAGNOSIS — Z7401 Bed confinement status: Secondary | ICD-10-CM | POA: Diagnosis not present

## 2022-11-19 DIAGNOSIS — T8149XA Infection following a procedure, other surgical site, initial encounter: Secondary | ICD-10-CM | POA: Diagnosis present

## 2022-11-19 DIAGNOSIS — X58XXXA Exposure to other specified factors, initial encounter: Secondary | ICD-10-CM | POA: Diagnosis present

## 2022-11-19 DIAGNOSIS — J309 Allergic rhinitis, unspecified: Secondary | ICD-10-CM | POA: Diagnosis present

## 2022-11-19 DIAGNOSIS — Z794 Long term (current) use of insulin: Secondary | ICD-10-CM | POA: Diagnosis not present

## 2022-11-19 DIAGNOSIS — M1990 Unspecified osteoarthritis, unspecified site: Secondary | ICD-10-CM | POA: Diagnosis present

## 2022-11-19 DIAGNOSIS — N179 Acute kidney failure, unspecified: Secondary | ICD-10-CM | POA: Diagnosis present

## 2022-11-19 DIAGNOSIS — N3281 Overactive bladder: Secondary | ICD-10-CM | POA: Diagnosis present

## 2022-11-19 DIAGNOSIS — E785 Hyperlipidemia, unspecified: Secondary | ICD-10-CM | POA: Diagnosis present

## 2022-11-19 DIAGNOSIS — I10 Essential (primary) hypertension: Secondary | ICD-10-CM | POA: Diagnosis present

## 2022-11-19 DIAGNOSIS — Y838 Other surgical procedures as the cause of abnormal reaction of the patient, or of later complication, without mention of misadventure at the time of the procedure: Secondary | ICD-10-CM | POA: Diagnosis present

## 2022-11-19 DIAGNOSIS — R531 Weakness: Secondary | ICD-10-CM | POA: Diagnosis not present

## 2022-11-19 LAB — CBC WITH DIFFERENTIAL (CANCER CENTER ONLY)
Abs Immature Granulocytes: 0.04 10*3/uL (ref 0.00–0.07)
Basophils Absolute: 0 10*3/uL (ref 0.0–0.1)
Basophils Relative: 1 %
Eosinophils Absolute: 0.1 10*3/uL (ref 0.0–0.5)
Eosinophils Relative: 2 %
HCT: 32.7 % — ABNORMAL LOW (ref 36.0–46.0)
Hemoglobin: 11.5 g/dL — ABNORMAL LOW (ref 12.0–15.0)
Immature Granulocytes: 1 %
Lymphocytes Relative: 28 %
Lymphs Abs: 1.3 10*3/uL (ref 0.7–4.0)
MCH: 33.1 pg (ref 26.0–34.0)
MCHC: 35.2 g/dL (ref 30.0–36.0)
MCV: 94.2 fL (ref 80.0–100.0)
Monocytes Absolute: 0.1 10*3/uL (ref 0.1–1.0)
Monocytes Relative: 3 %
Neutro Abs: 3 10*3/uL (ref 1.7–7.7)
Neutrophils Relative %: 65 %
Platelet Count: 170 10*3/uL (ref 150–400)
RBC: 3.47 MIL/uL — ABNORMAL LOW (ref 3.87–5.11)
RDW: 14.3 % (ref 11.5–15.5)
WBC Count: 4.5 10*3/uL (ref 4.0–10.5)
nRBC: 0 % (ref 0.0–0.2)

## 2022-11-19 LAB — CMP (CANCER CENTER ONLY)
ALT: 17 U/L (ref 0–44)
AST: 11 U/L — ABNORMAL LOW (ref 15–41)
Albumin: 4.3 g/dL (ref 3.5–5.0)
Alkaline Phosphatase: 34 U/L — ABNORMAL LOW (ref 38–126)
Anion gap: 9 (ref 5–15)
BUN: 29 mg/dL — ABNORMAL HIGH (ref 8–23)
CO2: 25 mmol/L (ref 22–32)
Calcium: 9.2 mg/dL (ref 8.9–10.3)
Chloride: 101 mmol/L (ref 98–111)
Creatinine: 0.94 mg/dL (ref 0.44–1.00)
GFR, Estimated: >60 mL/min (ref >60)
Glucose, Bld: 127 mg/dL — ABNORMAL HIGH (ref 70–99)
Potassium: 3.6 mmol/L (ref 3.5–5.1)
Sodium: 135 mmol/L (ref 135–145)
Total Bilirubin: 0.6 mg/dL (ref 0.3–1.2)
Total Protein: 7.4 g/dL (ref 6.5–8.1)

## 2022-11-19 MED ORDER — VALACYCLOVIR HCL 500 MG PO TABS: 500.0000 mg | ORAL_TABLET | Freq: Two times a day (BID) | ORAL | 0 refills | Status: DC

## 2022-11-19 MED ORDER — SODIUM CHLORIDE 0.9% FLUSH
10.0000 mL | Freq: Once | INTRAVENOUS | Status: AC
  Administered 2022-11-19: 10 mL

## 2022-11-19 MED ORDER — HEPARIN SOD (PORK) LOCK FLUSH 100 UNIT/ML IV SOLN
500.0000 [IU] | Freq: Once | INTRAVENOUS | Status: AC
  Administered 2022-11-19: 500 [IU]

## 2022-11-19 MED ORDER — LIDOCAINE VISCOUS HCL 2 % MT SOLN
5.0000 mL | Freq: Four times a day (QID) | OROMUCOSAL | 1 refills | Status: DC | PRN
  Filled 2022-11-19: qty 240, 12d supply, fill #0

## 2022-11-19 NOTE — Progress Notes (Signed)
Per NP request, prescription sent to [facility] for magic mouthwash.

## 2022-11-19 NOTE — Progress Notes (Signed)
[facility] Follow up:    Karina Foster, [name]   DIAGNOSIS: Right sided breast cancer  SUMMARY OF ONCOLOGIC HISTORY: Oncology History  Malignant neoplasm of upper-outer quadrant of right breast in female, estrogen receptor positive ([place])  08/27/2022 Initial Diagnosis   Screening mammogram detected right breast mass by ultrasound measured 2.3 cm biopsy revealed grade 3 IDC ER 50%, PR 0%, Ki-67 90%, HER2 1+ negative   10/14/2022 Surgery   Right mastectomy: High-grade IDC 3.4 cm with focal sarcomatoid features (metaplastic) 3.4 cm grade 3 with DCIS, lymphovascular invasion present, focal perineural invasion present, margins negative ER 50%, PR 0%, HER2 1+   11/12/2022 -  Chemotherapy   Patient is on Treatment Plan : BREAST Adjuvant CMF IV q21d       CURRENT THERAPY: CMF  INTERVAL HISTORY: Karina Foster 82 y.o. female returns for f/u after receiving her first cycle of CMF chemotherapy.  She Tolerated treatment well.  She has some mouth sores.  She has had difficulty with her mouth previously due to ulcers.     She is also experiencing muscle cramps in the hands and legs and tells me that she is drinking enough fluids.    Her right axillary incision dehisced this past Monday.  She went to Dr. Marlowe Aschoff office and had it packed.  She goes to their office each day for packing and dressing changes.  She has f/u with Dr. Barry Dienes on Monday, 11/22/2022. She denies any fevers.  She notes her energy level is improved today.  She was extremely fatigued yesterday.    Patient Active Problem List   Diagnosis Date Noted   Port-A-Cath in place 11/19/2022   Breast cancer of upper-outer quadrant of right female breast ([place]) 10/14/2022   Hyperlipemia 10/14/2022   Malignant neoplasm of upper-outer quadrant of right breast in female, estrogen receptor positive ([place]) 09/15/2022   History of MI (myocardial infarction) 09/13/2022   BPPV (benign  paroxysmal positional vertigo) 05/01/2019   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[place]) [id number]   Diabetic peripheral neuropathy associated with type 2 diabetes mellitus ([place]) 09/19/2012   Essential hypertension 08/13/2009    is allergic to azithromycin and demerol [meperidine].  MEDICAL HISTORY: Past Medical History:  Diagnosis Date   Allergic rhinitis, seasonal    Arthritis    hands   Asthma    Years ago due to a cat. 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    GERD (gastroesophageal reflux disease)    History of benign neoplasm of tongue    per pt has had several bx's of lesions   History of cancer chemotherapy 1997   right breast cancer   History of external beam radiation therapy 1997   right breast cancer   History of iron deficiency anemia    History of non-ST elevation myocardial  infarction (NSTEMI) 12/14/2008   History of right breast cancer 1997   s/p  right partial masectomy node dissection's,  completed chemo and radiation same year ,   no recurrence since   Hyperlipidemia    Mood disorder ([place])    Myocardial infarction ([place]) 2010   Neuropathy due to chemotherapeutic drug ([place])    hands   OAB (overactive bladder)    OSA (obstructive sleep apnea) 2016   sleep study in epic 10-04-2014, AHI 22.7/ hr;  (06-17-2022  pt stated has not used dental appliance in few years, does not fit )   Peripheral neuropathy     feet   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/2008   x2  to proximal and mid LAD   Urinary incontinence, mixed    urologist--- dr pace   Wears glasses    Wears hearing aid in both ears     SURGICAL HISTORY: Past Surgical History:  Procedure Laterality Date   BOTOX INJECTION N/A 06/22/2022   Procedure: ABORTED BOTOX INJECTION 75 UNITS;  Surgeon: Robley Fries, MD;  Location: [facility];  Service: Urology;  Laterality: N/A;   BOTOX INJECTION N/A 07/20/2022   Procedure: BOTOX INJECTION;  Surgeon: Robley Fries, MD;  Location[facility];  Service: Urology;  Laterality: N/A;   BREAST BIOPSY Right 08/27/2022   [place] RT BREAST BX W LOC DEV 1ST LESION IMG BX SPEC US GUIDE 08/27/2022 [facility] MAMMOGRAPHY   CATARACT EXTRACTION W/ INTRAOCULAR LENS IMPLANT Bilateral 2014   CORONARY ANGIOPLASTY WITH STENT PLACEMENT  12/16/2008   @[facility]   by dr berry;   PCI and stenting to proximal and mid LAD (DES) w/ residual D1 80%  nonobstructive disease involving RCA/ CFx,  ef 50%   CYSTOSCOPY N/A 06/22/2022   Procedure: CYSTOSCOPY, BLADDER BIOPSY, AND FULGERATION;  Surgeon: Robley Fries, MD;  Location: [facility];  Service: Urology;  Laterality: N/A;  30 MINS   CYSTOSCOPY N/A 07/20/2022   Procedure: CYSTOSCOPY, BLADDER FULGERATION;  Surgeon: Robley Fries, MD;  Location[facility];  Service: Urology;  Laterality: N/A;  30 MINS   EXCISION OF BREAST BIOPSY Right 1995   MASTECTOMY PARTIAL / LUMPECTOMY W/ AXILLARY LYMPHADENECTOMY Right 1997   PORTACATH PLACEMENT N/A 10/14/2022   Procedure: PORT PLACEMENT;  Surgeon: Stark Klein, MD;  Location: [facility];  Service: General;  Laterality: N/A;   TOTAL MASTECTOMY Right 10/14/2022   Procedure: RIGHT MASTECTOMY;  Surgeon: Stark Klein, MD;  Location: [facility];  Service: General;  Laterality: Right;  PEC BLOCK   TUBAL LIGATION Bilateral 1977    SOCIAL HISTORY: Social History   Socioeconomic  History   Marital status: Married    Spouse name: Not on file   Number of children: 1   Years of education: Assoc    Highest education level: Not on file  Occupational History    Comment: retired  Tobacco Use   Smoking status: Former    Packs/day: 1.00    Years: 20.00    Additional pack years: 0.00    Total pack years: 20.00    Types: Cigarettes    Quit date: 1991    Years since quitting: 33.2   Smokeless tobacco: Never  Vaping Use   Vaping Use: Never used  Substance and Sexual Activity   Alcohol use: Yes    Alcohol/week: 7.0 standard drinks of alcohol    Types: 7 Glasses of wine per week    Comment: Wine prior  to dinner   Drug use: Never   Sexual activity: Not on file  Other Topics Concern   Not on file  Social History Narrative   3 caffeine drinks a day    Social Determinants of Health   Financial Resource Strain: Not on file  Food Insecurity: Not on file  Transportation Needs: Not on file  Physical Activity: Not on file  Stress: Not on file  Social Connections: Not on file  Intimate Partner Violence: Not on file    FAMILY HISTORY: Family History  Problem Relation Age of Onset   Osteoporosis Mother    Breast cancer Mother 96   Heart Problems Mother    Other Father        MI   CVA Father    Asthma Father    Diabetes Mellitus II Father    Hypertension Sister    Ovarian cancer Sister 20    Review of Systems  Constitutional:  Positive for fatigue. Negative for appetite change, chills, fever and unexpected weight change.  HENT:   Negative for hearing loss, lump/mass and trouble swallowing.   Eyes:  Negative for eye problems and icterus.  Respiratory:  Negative for chest tightness, cough and shortness of breath.   Cardiovascular:  Negative for chest pain, leg swelling and palpitations.  Gastrointestinal:  Negative for abdominal distention, abdominal pain, constipation, diarrhea, nausea and vomiting.  Endocrine: Negative for hot flashes.  Genitourinary:   Negative for difficulty urinating.   Musculoskeletal:  Negative for arthralgias.  Skin:  Negative for itching and rash.  Neurological:  Negative for dizziness, extremity weakness, headaches and numbness.  Hematological:  Negative for adenopathy. Does not bruise/bleed easily.  Psychiatric/Behavioral:  Negative for depression. The patient is not nervous/anxious.       PHYSICAL EXAMINATION  ECOG PERFORMANCE STATUS: 1 - Symptomatic but completely ambulatory  Vitals:   11/19/22 1523  BP: (!) 159/63  Pulse: (!) 102  Resp: 17  Temp: 98.1 F (36.7 C)  SpO2: 100%    Physical Exam Constitutional:      General: She is not in acute distress.    Appearance: Normal appearance. She is not toxic-appearing.  HENT:     Head: Normocephalic and atraumatic.     Mouth/Throat:     Comments: + ulcerations on lateral tongue bilaterally Eyes:     General: No scleral icterus. Cardiovascular:     Rate and Rhythm: Normal rate and regular rhythm.     Pulses: Normal pulses.     Heart sounds: Normal heart sounds.  Pulmonary:     Effort: Pulmonary effort is normal.     Breath sounds: Normal breath sounds.  Abdominal:     General: Abdomen is flat. Bowel sounds are normal. There is no distension.     Palpations: Abdomen is soft.     Tenderness: There is no abdominal tenderness.  Musculoskeletal:        General: No swelling.     Cervical back: Neck supple.  Lymphadenopathy:     Cervical: No cervical adenopathy.  Skin:    General: Skin is warm and dry.     Findings: No rash.  Neurological:     General: No focal deficit present.     Mental Status: She is alert.  Psychiatric:        Mood and Affect: Mood normal.        Behavior: Behavior normal.     LABORATORY DATA:  CBC    Component Value Date/Time   WBC 4.5  11/19/2022 1446   WBC 10.5 10/15/2022 0301   RBC 3.47 (L) 11/19/2022 1446   HGB 11.5 (L) 11/19/2022 1446   HCT 32.7 (L) 11/19/2022 1446   PLT 170 11/19/2022 1446   MCV 94.2  11/19/2022 1446   MCH 33.1 11/19/2022 1446   MCHC 35.2 11/19/2022 1446   RDW 14.3 11/19/2022 1446   LYMPHSABS 1.3 11/19/2022 1446   MONOABS 0.1 11/19/2022 1446   EOSABS 0.1 11/19/2022 1446   BASOSABS 0.0 11/19/2022 1446    CMP     Component Value Date/Time   NA 135 11/19/2022 1446   K 3.6 11/19/2022 1446   CL 101 11/19/2022 1446   CO2 25 11/19/2022 1446   GLUCOSE 127 (H) 11/19/2022 1446   BUN 29 (H) 11/19/2022 1446   CREATININE 0.94 11/19/2022 1446   CALCIUM 9.2 11/19/2022 1446   PROT 7.4 11/19/2022 1446   ALBUMIN 4.3 11/19/2022 1446   AST 11 (L) 11/19/2022 1446   ALT 17 11/19/2022 1446   ALKPHOS 34 (L) 11/19/2022 1446   BILITOT 0.6 11/19/2022 1446   GFRNONAA >60 11/19/2022 1446   GFRAA  12/18/2008 0505    >60        The eGFR has been calculated using the MDRD equation. This calculation has not been validated in all clinical situations. eGFR's persistently <60 mL/min signify possible Chronic Kidney Disease.       ASSESSMENT and THERAPY PLAN:   Malignant neoplasm of upper-outer quadrant of right breast in female, estrogen receptor positive ([place]) Karina Foster is a 82 year old woman with right-sided estrogen positive breast cancer here today for follow-up and evaluation after receiving her first cycle of adjuvant chemotherapy.  Treatment plan: Mastectomy adjuvant chemotherapy with CMF x 6 cycles  No role of antiestrogen therapy since her repeat prognostic panel was triple negative  Chemo toxicities: Fatigue-energy conservation Chemotherapy-induced anemia-monitoring Mucositis--prescribed Valtrex at slightly lower dose since she is on tizanidine and Magic mouthwash.  Surgical dehiscence: She is following up with Dr. Barbee Shropshire.  We will see what Dr. Barbee Shropshire says about this next week on whether we need to hold future chemotherapy cycles.  All questions were answered. The patient knows to call the clinic with any problems, questions or concerns. We can certainly see the  patient much sooner if necessary.  Total encounter time:30 minutes*in face-to-face visit time, chart review, lab review, care coordination, order entry, and documentation of the encounter time.   Wilber Bihari, NP 11/19/22 3:49 PM Medical Oncology and Hematology [facility] [place], [place] [address] Tel. [phone]    Fax. [phone]  *Total Encounter Time as defined by the Centers for Medicare and Medicaid Services includes, in addition to the face-to-face time of a patient visit (documented in the note above) non-face-to-face time: obtaining and reviewing outside history, ordering and reviewing medications, tests or procedures, care coordination (communications with other health care professionals or caregivers) and documentation in the medical record.

## 2022-11-19 NOTE — Progress Notes (Signed)
Pt. Here for Port flush and labs!  States she has whole/open area under her right armpit where she had a recent surgery and treatment.  Dressing to the area is dry and intact.  States she does have drainage from that area at times.  Lindsey/NP and Kelsey/RN made aware and states Mendel Ryder will address it during her visit today.

## 2022-11-19 NOTE — Assessment & Plan Note (Signed)
Karina Foster is a 82 year old woman with right-sided estrogen positive breast cancer here today for follow-up and evaluation after receiving her first cycle of adjuvant chemotherapy.  Treatment plan: Mastectomy adjuvant chemotherapy with CMF x 6 cycles  No role of antiestrogen therapy since her repeat prognostic panel was triple negative  Chemo toxicities: Fatigue-energy conservation Chemotherapy-induced anemia-monitoring Mucositis--prescribed Valtrex at slightly lower dose since she is on tizanidine and Magic mouthwash.  Surgical dehiscence: She is following up with Dr. Barbee Shropshire.  We will see what Dr. Barbee Shropshire says about this next week on whether we need to hold future chemotherapy cycles.

## 2022-11-21 ENCOUNTER — Inpatient Hospital Stay (HOSPITAL_COMMUNITY)
Admission: EM | Admit: 2022-11-21 | Discharge: 2022-11-26 | DRG: 863 | Disposition: A | Discharge status: Skilled Nursing Facility | Payer: Medicare Other | Source: Skilled Nursing Facility | Attending: Family Medicine | Admitting: Family Medicine

## 2022-11-21 DIAGNOSIS — Z881 Allergy status to other antibiotic agents status: Secondary | ICD-10-CM

## 2022-11-21 DIAGNOSIS — N390 Urinary tract infection, site not specified: Secondary | ICD-10-CM | POA: Diagnosis present

## 2022-11-21 DIAGNOSIS — E876 Hypokalemia: Secondary | ICD-10-CM

## 2022-11-21 DIAGNOSIS — I252 Old myocardial infarction: Secondary | ICD-10-CM

## 2022-11-21 DIAGNOSIS — N179 Acute kidney failure, unspecified: Secondary | ICD-10-CM

## 2022-11-21 DIAGNOSIS — G620 Drug-induced polyneuropathy: Secondary | ICD-10-CM | POA: Diagnosis present

## 2022-11-21 DIAGNOSIS — N3000 Acute cystitis without hematuria: Secondary | ICD-10-CM | POA: Diagnosis not present

## 2022-11-21 DIAGNOSIS — Z170 Estrogen receptor positive status [ER+]: Secondary | ICD-10-CM

## 2022-11-21 DIAGNOSIS — C50411 Malignant neoplasm of upper-outer quadrant of right female breast: Secondary | ICD-10-CM | POA: Diagnosis present

## 2022-11-21 DIAGNOSIS — F32A Depression, unspecified: Secondary | ICD-10-CM | POA: Diagnosis present

## 2022-11-21 DIAGNOSIS — A419 Sepsis, unspecified organism: Principal | ICD-10-CM | POA: Diagnosis present

## 2022-11-21 DIAGNOSIS — E785 Hyperlipidemia, unspecified: Secondary | ICD-10-CM | POA: Diagnosis present

## 2022-11-21 DIAGNOSIS — Z974 Presence of external hearing-aid: Secondary | ICD-10-CM

## 2022-11-21 DIAGNOSIS — I10 Essential (primary) hypertension: Secondary | ICD-10-CM | POA: Diagnosis present

## 2022-11-21 DIAGNOSIS — Z9011 Acquired absence of right breast and nipple: Secondary | ICD-10-CM

## 2022-11-21 DIAGNOSIS — Z833 Family history of diabetes mellitus: Secondary | ICD-10-CM

## 2022-11-21 DIAGNOSIS — Z803 Family history of malignant neoplasm of breast: Secondary | ICD-10-CM

## 2022-11-21 DIAGNOSIS — Z885 Allergy status to narcotic agent status: Secondary | ICD-10-CM

## 2022-11-21 DIAGNOSIS — Z955 Presence of coronary angioplasty implant and graft: Secondary | ICD-10-CM

## 2022-11-21 DIAGNOSIS — R110 Nausea: Secondary | ICD-10-CM | POA: Diagnosis not present

## 2022-11-21 DIAGNOSIS — E1149 Type 2 diabetes mellitus with other diabetic neurological complication: Secondary | ICD-10-CM | POA: Diagnosis present

## 2022-11-21 DIAGNOSIS — Z22322 Carrier or suspected carrier of Methicillin resistant Staphylococcus aureus: Secondary | ICD-10-CM

## 2022-11-21 DIAGNOSIS — K8020 Calculus of gallbladder without cholecystitis without obstruction: Secondary | ICD-10-CM | POA: Diagnosis present

## 2022-11-21 DIAGNOSIS — Z923 Personal history of irradiation: Secondary | ICD-10-CM

## 2022-11-21 DIAGNOSIS — N3281 Overactive bladder: Secondary | ICD-10-CM | POA: Diagnosis present

## 2022-11-21 DIAGNOSIS — D709 Neutropenia, unspecified: Principal | ICD-10-CM | POA: Diagnosis present

## 2022-11-21 DIAGNOSIS — Z79899 Other long term (current) drug therapy: Secondary | ICD-10-CM

## 2022-11-21 DIAGNOSIS — M1990 Unspecified osteoarthritis, unspecified site: Secondary | ICD-10-CM | POA: Diagnosis present

## 2022-11-21 DIAGNOSIS — Z87891 Personal history of nicotine dependence: Secondary | ICD-10-CM

## 2022-11-21 DIAGNOSIS — Z1152 Encounter for screening for COVID-19: Secondary | ICD-10-CM

## 2022-11-21 DIAGNOSIS — R000 Tachycardia, unspecified: Secondary | ICD-10-CM

## 2022-11-21 DIAGNOSIS — Y838 Other surgical procedures as the cause of abnormal reaction of the patient, or of later complication, without mention of misadventure at the time of the procedure: Secondary | ICD-10-CM | POA: Diagnosis present

## 2022-11-21 DIAGNOSIS — Z95828 Presence of other vascular implants and grafts: Secondary | ICD-10-CM

## 2022-11-21 DIAGNOSIS — R5081 Fever presenting with conditions classified elsewhere: Secondary | ICD-10-CM | POA: Diagnosis not present

## 2022-11-21 DIAGNOSIS — H3530 Unspecified macular degeneration: Secondary | ICD-10-CM | POA: Diagnosis present

## 2022-11-21 DIAGNOSIS — T8149XA Infection following a procedure, other surgical site, initial encounter: Secondary | ICD-10-CM | POA: Diagnosis present

## 2022-11-21 DIAGNOSIS — K760 Fatty (change of) liver, not elsewhere classified: Secondary | ICD-10-CM | POA: Diagnosis present

## 2022-11-21 DIAGNOSIS — D701 Agranulocytosis secondary to cancer chemotherapy: Secondary | ICD-10-CM | POA: Diagnosis present

## 2022-11-21 DIAGNOSIS — T8131XA Disruption of external operation (surgical) wound, not elsewhere classified, initial encounter: Secondary | ICD-10-CM | POA: Diagnosis present

## 2022-11-21 DIAGNOSIS — Z7982 Long term (current) use of aspirin: Secondary | ICD-10-CM

## 2022-11-21 DIAGNOSIS — T8144XA Sepsis following a procedure, initial encounter: Principal | ICD-10-CM | POA: Diagnosis present

## 2022-11-21 DIAGNOSIS — X58XXXA Exposure to other specified factors, initial encounter: Secondary | ICD-10-CM | POA: Diagnosis present

## 2022-11-21 DIAGNOSIS — I2510 Atherosclerotic heart disease of native coronary artery without angina pectoris: Secondary | ICD-10-CM | POA: Diagnosis present

## 2022-11-21 DIAGNOSIS — I700 Atherosclerosis of aorta: Secondary | ICD-10-CM | POA: Diagnosis present

## 2022-11-21 DIAGNOSIS — J309 Allergic rhinitis, unspecified: Secondary | ICD-10-CM | POA: Diagnosis present

## 2022-11-21 DIAGNOSIS — T451X5A Adverse effect of antineoplastic and immunosuppressive drugs, initial encounter: Secondary | ICD-10-CM | POA: Diagnosis present

## 2022-11-21 DIAGNOSIS — R531 Weakness: Secondary | ICD-10-CM | POA: Diagnosis not present

## 2022-11-21 DIAGNOSIS — N200 Calculus of kidney: Secondary | ICD-10-CM | POA: Diagnosis present

## 2022-11-21 DIAGNOSIS — Z961 Presence of intraocular lens: Secondary | ICD-10-CM | POA: Diagnosis present

## 2022-11-21 DIAGNOSIS — G4733 Obstructive sleep apnea (adult) (pediatric): Secondary | ICD-10-CM | POA: Diagnosis present

## 2022-11-21 DIAGNOSIS — H8110 Benign paroxysmal vertigo, unspecified ear: Secondary | ICD-10-CM | POA: Diagnosis present

## 2022-11-21 DIAGNOSIS — N3090 Cystitis, unspecified without hematuria: Secondary | ICD-10-CM

## 2022-11-21 DIAGNOSIS — N61 Mastitis without abscess: Secondary | ICD-10-CM | POA: Diagnosis present

## 2022-11-21 DIAGNOSIS — K219 Gastro-esophageal reflux disease without esophagitis: Secondary | ICD-10-CM | POA: Diagnosis present

## 2022-11-21 DIAGNOSIS — Z794 Long term (current) use of insulin: Secondary | ICD-10-CM

## 2022-11-22 ENCOUNTER — Emergency Department (HOSPITAL_COMMUNITY): Payer: Medicare Other

## 2022-11-22 ENCOUNTER — Other Ambulatory Visit: Payer: Self-pay

## 2022-11-22 ENCOUNTER — Encounter (HOSPITAL_COMMUNITY): Payer: Self-pay

## 2022-11-22 DIAGNOSIS — N3000 Acute cystitis without hematuria: Secondary | ICD-10-CM | POA: Diagnosis not present

## 2022-11-22 DIAGNOSIS — R112 Nausea with vomiting, unspecified: Secondary | ICD-10-CM | POA: Diagnosis not present

## 2022-11-22 DIAGNOSIS — R000 Tachycardia, unspecified: Secondary | ICD-10-CM | POA: Insufficient documentation

## 2022-11-22 DIAGNOSIS — F32A Depression, unspecified: Secondary | ICD-10-CM | POA: Diagnosis present

## 2022-11-22 DIAGNOSIS — K6389 Other specified diseases of intestine: Secondary | ICD-10-CM | POA: Diagnosis not present

## 2022-11-22 DIAGNOSIS — T8144XA Sepsis following a procedure, initial encounter: Secondary | ICD-10-CM | POA: Diagnosis present

## 2022-11-22 DIAGNOSIS — Z170 Estrogen receptor positive status [ER+]: Secondary | ICD-10-CM | POA: Diagnosis not present

## 2022-11-22 DIAGNOSIS — N179 Acute kidney failure, unspecified: Secondary | ICD-10-CM | POA: Diagnosis present

## 2022-11-22 DIAGNOSIS — I700 Atherosclerosis of aorta: Secondary | ICD-10-CM | POA: Diagnosis present

## 2022-11-22 DIAGNOSIS — T8149XA Infection following a procedure, other surgical site, initial encounter: Secondary | ICD-10-CM | POA: Diagnosis present

## 2022-11-22 DIAGNOSIS — M1990 Unspecified osteoarthritis, unspecified site: Secondary | ICD-10-CM | POA: Diagnosis present

## 2022-11-22 DIAGNOSIS — K760 Fatty (change of) liver, not elsewhere classified: Secondary | ICD-10-CM | POA: Diagnosis present

## 2022-11-22 DIAGNOSIS — Z1152 Encounter for screening for COVID-19: Secondary | ICD-10-CM | POA: Diagnosis not present

## 2022-11-22 DIAGNOSIS — G4733 Obstructive sleep apnea (adult) (pediatric): Secondary | ICD-10-CM | POA: Diagnosis present

## 2022-11-22 DIAGNOSIS — R509 Fever, unspecified: Secondary | ICD-10-CM | POA: Diagnosis not present

## 2022-11-22 DIAGNOSIS — T8131XA Disruption of external operation (surgical) wound, not elsewhere classified, initial encounter: Secondary | ICD-10-CM | POA: Diagnosis present

## 2022-11-22 DIAGNOSIS — Z7401 Bed confinement status: Secondary | ICD-10-CM | POA: Diagnosis not present

## 2022-11-22 DIAGNOSIS — E785 Hyperlipidemia, unspecified: Secondary | ICD-10-CM | POA: Diagnosis present

## 2022-11-22 DIAGNOSIS — D701 Agranulocytosis secondary to cancer chemotherapy: Secondary | ICD-10-CM

## 2022-11-22 DIAGNOSIS — I2510 Atherosclerotic heart disease of native coronary artery without angina pectoris: Secondary | ICD-10-CM | POA: Diagnosis present

## 2022-11-22 DIAGNOSIS — N390 Urinary tract infection, site not specified: Secondary | ICD-10-CM | POA: Diagnosis present

## 2022-11-22 DIAGNOSIS — F29 Unspecified psychosis not due to a substance or known physiological condition: Secondary | ICD-10-CM | POA: Diagnosis not present

## 2022-11-22 DIAGNOSIS — I10 Essential (primary) hypertension: Secondary | ICD-10-CM | POA: Diagnosis present

## 2022-11-22 DIAGNOSIS — N200 Calculus of kidney: Secondary | ICD-10-CM | POA: Diagnosis not present

## 2022-11-22 DIAGNOSIS — N61 Mastitis without abscess: Secondary | ICD-10-CM | POA: Diagnosis present

## 2022-11-22 DIAGNOSIS — E876 Hypokalemia: Secondary | ICD-10-CM

## 2022-11-22 DIAGNOSIS — Y838 Other surgical procedures as the cause of abnormal reaction of the patient, or of later complication, without mention of misadventure at the time of the procedure: Secondary | ICD-10-CM | POA: Diagnosis present

## 2022-11-22 DIAGNOSIS — Z794 Long term (current) use of insulin: Secondary | ICD-10-CM | POA: Diagnosis not present

## 2022-11-22 DIAGNOSIS — K8020 Calculus of gallbladder without cholecystitis without obstruction: Secondary | ICD-10-CM | POA: Diagnosis present

## 2022-11-22 DIAGNOSIS — C50411 Malignant neoplasm of upper-outer quadrant of right female breast: Secondary | ICD-10-CM | POA: Diagnosis present

## 2022-11-22 DIAGNOSIS — D709 Neutropenia, unspecified: Principal | ICD-10-CM

## 2022-11-22 DIAGNOSIS — R5081 Fever presenting with conditions classified elsewhere: Secondary | ICD-10-CM | POA: Diagnosis present

## 2022-11-22 DIAGNOSIS — A419 Sepsis, unspecified organism: Secondary | ICD-10-CM | POA: Diagnosis not present

## 2022-11-22 DIAGNOSIS — N3281 Overactive bladder: Secondary | ICD-10-CM | POA: Diagnosis present

## 2022-11-22 DIAGNOSIS — C50919 Malignant neoplasm of unspecified site of unspecified female breast: Secondary | ICD-10-CM | POA: Diagnosis not present

## 2022-11-22 DIAGNOSIS — X58XXXA Exposure to other specified factors, initial encounter: Secondary | ICD-10-CM | POA: Diagnosis present

## 2022-11-22 DIAGNOSIS — G620 Drug-induced polyneuropathy: Secondary | ICD-10-CM | POA: Diagnosis present

## 2022-11-22 DIAGNOSIS — R531 Weakness: Secondary | ICD-10-CM | POA: Diagnosis not present

## 2022-11-22 DIAGNOSIS — J309 Allergic rhinitis, unspecified: Secondary | ICD-10-CM | POA: Diagnosis present

## 2022-11-22 LAB — COMPREHENSIVE METABOLIC PANEL
ALT: 16 U/L (ref 0–44)
AST: 14 U/L — ABNORMAL LOW (ref 15–41)
Albumin: 4 g/dL (ref 3.5–5.0)
Alkaline Phosphatase: 35 U/L — ABNORMAL LOW (ref 38–126)
Anion gap: 10 (ref 5–15)
BUN: 27 mg/dL — ABNORMAL HIGH (ref 8–23)
CO2: 19 mmol/L — ABNORMAL LOW (ref 22–32)
Calcium: 8.4 mg/dL — ABNORMAL LOW (ref 8.9–10.3)
Chloride: 101 mmol/L (ref 98–111)
Creatinine, Ser: 1.02 mg/dL — ABNORMAL HIGH (ref 0.44–1.00)
GFR, Estimated: 56 mL/min — ABNORMAL LOW (ref >60)
Glucose, Bld: 172 mg/dL — ABNORMAL HIGH (ref 70–99)
Potassium: 3.4 mmol/L — ABNORMAL LOW (ref 3.5–5.1)
Sodium: 130 mmol/L — ABNORMAL LOW (ref 135–145)
Total Bilirubin: 1 mg/dL (ref 0.3–1.2)
Total Protein: 7.5 g/dL (ref 6.5–8.1)

## 2022-11-22 LAB — CBC WITH DIFFERENTIAL/PLATELET
Abs Immature Granulocytes: 0.01 10*3/uL (ref 0.00–0.07)
Basophils Absolute: 0 10*3/uL (ref 0.0–0.1)
Basophils Relative: 2 %
Eosinophils Absolute: 0 10*3/uL (ref 0.0–0.5)
Eosinophils Relative: 2 %
HCT: 32.4 % — ABNORMAL LOW (ref 36.0–46.0)
Hemoglobin: 11 g/dL — ABNORMAL LOW (ref 12.0–15.0)
Immature Granulocytes: 2 %
Lymphocytes Relative: 22 %
Lymphs Abs: 0.1 10*3/uL — ABNORMAL LOW (ref 0.7–4.0)
MCH: 32.4 pg (ref 26.0–34.0)
MCHC: 34 g/dL (ref 30.0–36.0)
MCV: 95.6 fL (ref 80.0–100.0)
Monocytes Absolute: 0.1 10*3/uL (ref 0.1–1.0)
Monocytes Relative: 16 %
Neutro Abs: 0.3 10*3/uL — CL (ref 1.7–7.7)
Neutrophils Relative %: 56 %
Platelets: 160 10*3/uL (ref 150–400)
RBC: 3.39 MIL/uL — ABNORMAL LOW (ref 3.87–5.11)
RDW: 14.1 % (ref 11.5–15.5)
WBC: 0.6 10*3/uL — CL (ref 4.0–10.5)
nRBC: 0 % (ref 0.0–0.2)

## 2022-11-22 LAB — PROTIME-INR
INR: 1.2 (ref 0.8–1.2)
Prothrombin Time: 14.9 seconds (ref 11.4–15.2)

## 2022-11-22 LAB — MRSA NEXT GEN BY PCR, NASAL: MRSA by PCR Next Gen: DETECTED — AB

## 2022-11-22 LAB — URINALYSIS, ROUTINE W REFLEX MICROSCOPIC
Bacteria, UA: NONE SEEN
Bilirubin Urine: NEGATIVE
Glucose, UA: 50 mg/dL — AB
Ketones, ur: 20 mg/dL — AB
Leukocytes,Ua: NEGATIVE
Nitrite: NEGATIVE
Protein, ur: 30 mg/dL — AB
Specific Gravity, Urine: >1.046 — ABNORMAL HIGH (ref 1.005–1.030)
pH: 5 (ref 5.0–8.0)

## 2022-11-22 LAB — LACTIC ACID, PLASMA: Lactic Acid, Venous: 1 mmol/L (ref 0.5–1.9)

## 2022-11-22 LAB — RESP PANEL BY RT-PCR (RSV, FLU A&B, COVID)  RVPGX2
Influenza A by PCR: NEGATIVE
Influenza B by PCR: NEGATIVE
Resp Syncytial Virus by PCR: NEGATIVE
SARS Coronavirus 2 by RT PCR: NEGATIVE

## 2022-11-22 LAB — GLUCOSE, CAPILLARY
Glucose-Capillary: 177 mg/dL — ABNORMAL HIGH (ref 70–99)
Glucose-Capillary: 165 mg/dL — ABNORMAL HIGH (ref 70–99)
Glucose-Capillary: 203 mg/dL — ABNORMAL HIGH (ref 70–99)
Glucose-Capillary: 122 mg/dL — ABNORMAL HIGH (ref 70–99)

## 2022-11-22 MED ORDER — EZETIMIBE 10 MG PO TABS
10.0000 mg | ORAL_TABLET | ORAL | Status: DC
  Administered 2022-11-22 – 2022-11-26 (×3): 10 mg via ORAL
  Filled 2022-11-22 (×3): qty 1

## 2022-11-22 MED ORDER — VANCOMYCIN HCL IN DEXTROSE 1-5 GM/200ML-% IV SOLN
1000.0000 mg | INTRAVENOUS | Status: DC
  Administered 2022-11-22 – 2022-11-23 (×2): 1000 mg via INTRAVENOUS
  Filled 2022-11-22 (×2): qty 200

## 2022-11-22 MED ORDER — ENOXAPARIN SODIUM 40 MG/0.4ML IJ SOSY
40.0000 mg | PREFILLED_SYRINGE | INTRAMUSCULAR | Status: DC
  Administered 2022-11-22 – 2022-11-26 (×5): 40 mg via SUBCUTANEOUS
  Filled 2022-11-22 (×6): qty 0.4

## 2022-11-22 MED ORDER — ONDANSETRON HCL 4 MG/2ML IJ SOLN: 4.0000 mg | Freq: Four times a day (QID) | INTRAMUSCULAR | Status: DC | PRN

## 2022-11-22 MED ORDER — PROCHLORPERAZINE MALEATE 10 MG PO TABS: 10.0000 mg | ORAL_TABLET | Freq: Four times a day (QID) | ORAL | Status: DC | PRN

## 2022-11-22 MED ORDER — OXYCODONE HCL 5 MG PO TABS: 5.0000 mg | ORAL_TABLET | ORAL | Status: DC | PRN

## 2022-11-22 MED ORDER — METOPROLOL TARTRATE 25 MG PO TABS
12.5000 mg | ORAL_TABLET | Freq: Two times a day (BID) | ORAL | Status: DC
  Administered 2022-11-23 – 2022-11-26 (×6): 12.5 mg via ORAL
  Filled 2022-11-22 (×8): qty 1

## 2022-11-22 MED ORDER — TRAZODONE HCL 50 MG PO TABS
25.0000 mg | ORAL_TABLET | Freq: Every evening | ORAL | Status: DC | PRN
  Filled 2022-11-22: qty 1

## 2022-11-22 MED ORDER — ACETAMINOPHEN 325 MG PO TABS
650.0000 mg | ORAL_TABLET | Freq: Four times a day (QID) | ORAL | Status: DC | PRN
  Administered 2022-11-22 – 2022-11-26 (×10): 650 mg via ORAL
  Filled 2022-11-22 (×10): qty 2

## 2022-11-22 MED ORDER — POLYETHYLENE GLYCOL 3350 17 G PO PACK: 17.0000 g | PACK | Freq: Every day | ORAL | Status: DC | PRN

## 2022-11-22 MED ORDER — ASPIRIN 325 MG PO TABS
325.0000 mg | ORAL_TABLET | Freq: Every day | ORAL | Status: DC
  Administered 2022-11-22 – 2022-11-26 (×5): 325 mg via ORAL
  Filled 2022-11-22 (×5): qty 1

## 2022-11-22 MED ORDER — LACTATED RINGERS IV BOLUS
1000.0000 mL | Freq: Once | INTRAVENOUS | Status: AC
  Administered 2022-11-22: 1000 mL via INTRAVENOUS

## 2022-11-22 MED ORDER — DOCUSATE SODIUM 100 MG PO CAPS
100.0000 mg | ORAL_CAPSULE | Freq: Two times a day (BID) | ORAL | Status: DC
  Administered 2022-11-22 – 2022-11-26 (×5): 100 mg via ORAL
  Filled 2022-11-22 (×7): qty 1

## 2022-11-22 MED ORDER — INSULIN ASPART 100 UNIT/ML IJ SOLN: 0.0000 [IU] | Freq: Every day | INTRAMUSCULAR | Status: DC

## 2022-11-22 MED ORDER — IOHEXOL 300 MG/ML  SOLN
100.0000 mL | Freq: Once | INTRAMUSCULAR | Status: AC | PRN
  Administered 2022-11-22: 100 mL via INTRAVENOUS

## 2022-11-22 MED ORDER — VANCOMYCIN HCL 1500 MG/300ML IV SOLN
1500.0000 mg | Freq: Once | INTRAVENOUS | Status: AC
  Administered 2022-11-22: 1500 mg via INTRAVENOUS
  Filled 2022-11-22: qty 300

## 2022-11-22 MED ORDER — ONDANSETRON HCL 4 MG PO TABS: 4.0000 mg | ORAL_TABLET | Freq: Four times a day (QID) | ORAL | Status: DC | PRN

## 2022-11-22 MED ORDER — SODIUM CHLORIDE 0.9 % IV SOLN
1.0000 g | Freq: Once | INTRAVENOUS | Status: AC
  Administered 2022-11-22: 1 g via INTRAVENOUS
  Filled 2022-11-22: qty 10

## 2022-11-22 MED ORDER — POTASSIUM CHLORIDE IN NACL 20-0.9 MEQ/L-% IV SOLN
INTRAVENOUS | Status: DC
  Filled 2022-11-22 (×3): qty 1000

## 2022-11-22 MED ORDER — SODIUM CHLORIDE 0.9 % IV SOLN
2.0000 g | Freq: Once | INTRAVENOUS | Status: AC
  Administered 2022-11-22: 2 g via INTRAVENOUS
  Filled 2022-11-22 (×2): qty 12.5

## 2022-11-22 MED ORDER — VALACYCLOVIR HCL 500 MG PO TABS
500.0000 mg | ORAL_TABLET | Freq: Two times a day (BID) | ORAL | Status: DC
  Administered 2022-11-22 – 2022-11-26 (×9): 500 mg via ORAL
  Filled 2022-11-22 (×10): qty 1

## 2022-11-22 MED ORDER — ACETAMINOPHEN 650 MG RE SUPP: 650.0000 mg | Freq: Four times a day (QID) | RECTAL | Status: DC | PRN

## 2022-11-22 MED ORDER — CHLORHEXIDINE GLUCONATE CLOTH 2 % EX PADS
6.0000 | MEDICATED_PAD | Freq: Every day | CUTANEOUS | Status: DC
  Administered 2022-11-23 – 2022-11-26 (×4): 6 via TOPICAL

## 2022-11-22 MED ORDER — CITALOPRAM HYDROBROMIDE 40 MG PO TABS
40.0000 mg | ORAL_TABLET | Freq: Every day | ORAL | Status: DC
  Administered 2022-11-22 – 2022-11-26 (×5): 40 mg via ORAL
  Filled 2022-11-22 (×2): qty 1
  Filled 2022-11-22: qty 2
  Filled 2022-11-22 (×3): qty 1

## 2022-11-22 MED ORDER — INSULIN ASPART 100 UNIT/ML IJ SOLN
0.0000 [IU] | Freq: Three times a day (TID) | INTRAMUSCULAR | Status: DC
  Administered 2022-11-22: 3 [IU] via SUBCUTANEOUS
  Administered 2022-11-22: 5 [IU] via SUBCUTANEOUS
  Administered 2022-11-22: 3 [IU] via SUBCUTANEOUS
  Administered 2022-11-23: 2 [IU] via SUBCUTANEOUS
  Administered 2022-11-23 – 2022-11-24 (×2): 3 [IU] via SUBCUTANEOUS
  Administered 2022-11-24: 2 [IU] via SUBCUTANEOUS
  Administered 2022-11-24 – 2022-11-25 (×3): 3 [IU] via SUBCUTANEOUS
  Administered 2022-11-26: 2 [IU] via SUBCUTANEOUS
  Administered 2022-11-26: 5 [IU] via SUBCUTANEOUS
  Administered 2022-11-26: 2 [IU] via SUBCUTANEOUS

## 2022-11-22 MED ORDER — MUPIROCIN 2 % EX OINT
1.0000 | TOPICAL_OINTMENT | Freq: Two times a day (BID) | CUTANEOUS | Status: DC
  Administered 2022-11-22 – 2022-11-26 (×8): 1 via NASAL
  Filled 2022-11-22 (×2): qty 22

## 2022-11-22 MED ORDER — IOHEXOL 350 MG/ML SOLN: 100.0000 mL | Freq: Once | INTRAVENOUS | Status: DC | PRN

## 2022-11-22 MED ORDER — TIZANIDINE HCL 4 MG PO TABS: 2.0000 mg | ORAL_TABLET | Freq: Three times a day (TID) | ORAL | Status: DC | PRN

## 2022-11-22 MED ORDER — BEMPEDOIC ACID-EZETIMIBE 180-10 MG PO TABS: 1.0000 | ORAL_TABLET | ORAL | Status: DC

## 2022-11-22 MED ORDER — SODIUM CHLORIDE 0.9 % IV SOLN
2.0000 g | Freq: Two times a day (BID) | INTRAVENOUS | Status: DC
  Administered 2022-11-22 – 2022-11-24 (×6): 2 g via INTRAVENOUS
  Filled 2022-11-22 (×6): qty 12.5

## 2022-11-22 NOTE — Inpatient Diabetes Management (Signed)
Inpatient Diabetes Program Recommendations  AACE/ADA: New Consensus Statement on Inpatient Glycemic Control (2015)  Target Ranges:  Prepandial:   less than 140 mg/dL      Peak postprandial:   less than 180 mg/dL (1-2 hours)      Critically ill patients:  140 - 180 mg/dL   Lab Results  Component Value Date   GLUCAP 177 (H) 11/22/2022   HGBA1C 6.2 (H) 10/08/2022    Review of Glycemic Control  Diabetes history: DM2 Outpatient Diabetes medications: Tresiba 64 units QD after lunch, Humalog 30 units TID Current orders for Inpatient glycemic control: Novolog 0-15 TID with meals and 0-5 HS  Has Libre CGM  Inpatient Diabetes Program Recommendations:    Consider adding Semglee 20 units QD  If post-prandials > 180 mg/dL, consider adding Novolog 4 units TID with meals if eating > 50%  Spoke with at bedside regarding her home diabetes meds. Pt states she sees Dr Elsworth Soho about every 3 months. Also sees PhD at his office that specializes in diabetes. Said she has frequent lows in afternoon. Has lost 5 pounds recently. We discussed hypoglycemia s/s and treatment. Pt consumes 15 g CHO and rechecks in 15 min. States her HgbA1C has been below 7%. Explained that she may need to take less basal and bolus insulin while inpatient, as not eating like she does at home. Urged her to talk with PCP regarding her hypos at home. No other questions/concerns with blood sugar control.   Will follow closely.  Thank you. Lorenda Peck, RD, LDN, [place] Inpatient Diabetes Coordinator [phone]

## 2022-11-22 NOTE — H&P (Signed)
History and Physical  EVANEE SQUARE [id number] DOB: 1940/10/11 DOA: 11/21/2022  PCP: [name], MD   Chief Complaint: malaise   HPI: Karina Foster is a 82 y.o. female with medical history significant for breast cancer s/p mastectomy who recently started her first round of chemotherapy this past Friday 3/15.  She tolerated this well, but the next day started having some pretty severe fatigue.  Says that she was having trouble walking, having trouble almost staying awake, denies any fevers or chills, she had some trouble urinating but this was no change from her baseline as she has an overactive bladder.  However, through the weekend she continued to feel unwell, so came to the ER for evaluation.  ED Course: In the emergency department, she was febrile to one 2.8, tachycardic to 109, the rest of her vital signs were normal she was saturating 98% on room air.  Lab work was done, which shows WBC of 0.6, hemoglobin stable at 11, some mild hypokalemia of 3.4, and creatinine slightly elevated 1.02 from her normal baseline.  Her blood sugar was 177.  Urinalysis was done and not convincing of infection.  Review of Systems: Please see HPI for pertinent positives and negatives. A complete 10 system review of systems are otherwise negative.  Past Medical History:  Diagnosis Date   Allergic rhinitis, seasonal    Arthritis    hands   Asthma    Years ago due to a cat. 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    GERD (gastroesophageal reflux disease)    History of benign neoplasm of tongue    per pt has had several bx's of lesions   History of cancer chemotherapy 1997   right breast cancer   History of external beam radiation therapy 1997   right breast cancer   History of iron deficiency anemia    History of non-ST elevation myocardial infarction (NSTEMI) 12/14/2008   History of right breast cancer 1997   s/p  right partial masectomy node dissection's,  completed chemo and radiation same year ,   no recurrence since   Hyperlipidemia    Mood disorder ([place])    Myocardial infarction ([place]) 2010   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   OAB (overactive bladder)    OSA (obstructive sleep apnea) 2016   sleep study in epic 10-04-2014, AHI 22.7/ hr;  (06-17-2022  pt stated has not used dental appliance in few years, does not fit )   Peripheral neuropathy    feet   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/2008   x2  to proximal and mid LAD   Urinary incontinence, mixed    urologist--- dr pace   Wears glasses    Wears hearing aid in both ears    Past Surgical History:  Procedure Laterality Date   BOTOX INJECTION N/A 06/22/2022  Procedure: ABORTED BOTOX INJECTION 75 UNITS;  Surgeon: Robley Fries, MD;  Location: [facility];  Service: Urology;  Laterality: N/A;   BOTOX INJECTION N/A 07/20/2022   Procedure: BOTOX INJECTION;  Surgeon: Robley Fries, MD;  Location: [facility];  Service: Urology;  Laterality: N/A;   BREAST BIOPSY Right 08/27/2022   [place] RT BREAST BX W LOC DEV 1ST LESION IMG BX SPEC US GUIDE 08/27/2022 [facility]   CATARACT EXTRACTION W/ INTRAOCULAR LENS IMPLANT Bilateral 2014   CORONARY ANGIOPLASTY WITH STENT PLACEMENT  12/16/2008   @[facility]   by dr berry;   PCI and stenting to proximal and mid LAD (DES) w/ residual D1 80%  nonobstructive disease involving RCA/ CFx,  ef 50%    CYSTOSCOPY N/A 06/22/2022   Procedure: CYSTOSCOPY, BLADDER BIOPSY, AND FULGERATION;  Surgeon: Robley Fries, MD;  Location: [place];  Service: Urology;  Laterality: N/A;  30 MINS   CYSTOSCOPY N/A 07/20/2022   Procedure: CYSTOSCOPY, BLADDER FULGERATION;  Surgeon: Robley Fries, MD;  Location: [facility];  Service: Urology;  Laterality: N/A;  30 MINS   EXCISION OF BREAST BIOPSY Right 1995   MASTECTOMY PARTIAL / LUMPECTOMY W/ AXILLARY LYMPHADENECTOMY Right 1997   PORTACATH PLACEMENT N/A 10/14/2022   Procedure: PORT PLACEMENT;  Surgeon: Stark Klein, MD;  Location: [place] [facility];  Service: General;  Laterality: N/A;   TOTAL MASTECTOMY Right 10/14/2022   Procedure: RIGHT MASTECTOMY;  Surgeon: Stark Klein, MD;  Location: [place];  Service: General;  Laterality: Right;  PEC BLOCK   TUBAL LIGATION Bilateral 1977    Social History:  reports that she quit smoking about 33 years ago. Her smoking use included cigarettes. She has a 20.00 pack-year smoking history. She has never used smokeless tobacco. She reports current alcohol use of about 7.0 standard drinks of alcohol per week. She reports that she does not use drugs.   Allergies  Allergen Reactions   Azithromycin Rash   Demerol [Meperidine] Diarrhea and Nausea And Vomiting    Family History  Problem Relation Age of Onset   Osteoporosis Mother    Breast cancer Mother 74   Heart Problems Mother    Other Father        MI   CVA Father    Asthma Father    Diabetes Mellitus II Father    Hypertension Sister    Ovarian cancer Sister 77     Prior to Admission medications   Medication Sig Start Date End Date Taking? Authorizing Provider  acetaminophen (TYLENOL) 500 MG tablet Take 2 tablets (1,000 mg total) by mouth every 6 (six) hours. Patient taking differently: Take 1,000 mg by mouth every 6 (six) hours as needed for moderate pain. 10/15/22  Yes Stark Klein, MD  aspirin 325 MG tablet Take 325 mg by mouth daily.    Yes [provider]  Bempedoic Acid-Ezetimibe (NEXLIZET) 180-10 MG TABS Take 1 tablet by mouth daily. Patient taking differently: Take 1 tablet by mouth every other day. 10/24/19  Yes Adrian Prows, MD  Cholecalciferol (VITAMIN D3) 25 MCG (1000 UT) tablet Take 1,000 Units by mouth daily.   Yes [provider]  citalopram (CELEXA) 40 MG tablet Take 40 mg by mouth daily.   Yes [provider]  Cranberry Soft 500 MG CHEW Chew 500 mg by mouth daily.   Yes [provider]  HUMALOG KWIKPEN 100 UNIT/ML KwikPen Inject 30-36 Units into the skin in the morning, at noon, and  at bedtime. 09/21/19  Yes [provider]  ibuprofen (ADVIL) 200 MG tablet Take 200 mg by mouth every 6 (six) hours as needed for moderate pain.   Yes [provider]  lidocaine (XYLOCAINE) 2 % solution Use as directed 15 mLs in the mouth or throat every 6 (six) hours as needed for mouth pain. 11/19/22  Yes [provider]  lidocaine-prilocaine (EMLA) cream Apply to affected area once Patient taking differently: Apply 1 Application topically as needed (port). Apply to affected area once 10/25/22  Yes Nicholas Lose, MD  magic mouthwash (lidocaine, diphenhydrAMINE, alum & mag hydroxide) suspension Swish and spit 5 mLs by mouth 4 (four) times daily as needed for mouth pain. 11/19/22  Yes Nicholas Lose, MD  metoprolol tartrate (LOPRESSOR) 25 MG tablet TAKE 1/2 TABLET TWICE DAILY 03/29/22  Yes Adrian Prows, MD  Multiple Vitamins-Minerals (PRESERVISION AREDS) CAPS Take 1 capsule by mouth in the morning and at bedtime.   Yes [provider]  nitroGLYCERIN (NITROSTAT) 0.4 MG SL tablet Place 0.4 mg under the tongue every 5 (five) minutes as needed for chest pain.   Yes [provider]  prochlorperazine (COMPAZINE) 10 MG tablet Take 1 tablet (10 mg total) by mouth every 6 (six) hours as needed for nausea or vomiting. 10/25/22  Yes Nicholas Lose, MD  ramipril (ALTACE) 2.5 MG  capsule Take 2.5 mg by mouth daily.   Yes [provider]  tiZANidine (ZANAFLEX) 2 MG tablet Take 1 tablet (2 mg total) by mouth every 8 (eight) hours as needed for muscle spasms. 10/15/22  Yes Stark Klein, MD  TRESIBA FLEXTOUCH 200 UNIT/ML SOPN Inject 64 Units into the skin daily after lunch. 10/06/19  Yes [provider]  vitamin B-12 (CYANOCOBALAMIN) 1000 MCG tablet Take 1,000 mcg by mouth daily.   Yes [provider]  BD PEN NEEDLE NANO U/F 32G X 4 MM MISC SMARTSIG:2 Pen Needle SUB-Q Daily    [provider]  Continuous Blood Gluc Sensor (FREESTYLE LIBRE 14 DAY SENSOR) MISC Apply topically every 14 (fourteen) days. 09/12/22   [provider]  fluocinonide gel (LIDEX) [id number] % Apply 1 Application topically daily as needed (irritation on tongue).    [provider]  valACYclovir (VALTREX) 500 MG tablet Take 1 tablet (500 mg total) by mouth 2 (two) times daily. 11/19/22   Gardenia Phlegm, NP    Physical Exam: BP 118/68   Pulse (!) 107   Temp 100.3 F (37.9 C) (Oral)   Resp 20   Ht 5\' 7"  (1.702 m)   Wt 70.3 kg   SpO2 94%   BMI 24.28 kg/m   General:  Alert, oriented, calm, in no acute distress, pleasant and cooperative Eyes: EOMI, clear conjuctivae, white sclerea Neck: supple, no masses, trachea mildline  Cardiovascular: RRR, no murmurs or rubs, no peripheral edema  Respiratory: clear to auscultation bilaterally, no wheezes, no crackles  Abdomen: soft, nontender, nondistended, normal bowel tones heard  Skin: dry, no rashes, she has a wound on her chest which is pretty deeply packed, from her recent mastectomy she also has a port on her left side of her chest which has not been accessed yet. Musculoskeletal: no joint effusions, normal range of motion  Psychiatric: appropriate affect, normal speech  Neurologic: extraocular muscles intact, clear speech, moving all extremities with intact sensorium          Labs on Admission:   Basic Metabolic Panel: Recent Labs  Lab 11/19/22 1446 11/22/22 0058  NA 135  130*  K 3.6 3.4*  CL 101 101  CO2 25 19*  GLUCOSE 127* 172*  BUN 29* 27*  CREATININE 0.94 1.02*  CALCIUM 9.2 8.4*   Liver Function Tests: Recent Labs  Lab 11/19/22 1446 11/22/22 0058  AST 11* 14*  ALT 17 16  ALKPHOS 34* 35*  BILITOT 0.6 1.0  PROT 7.4 7.5  ALBUMIN 4.3 4.0   No results for input(s): "LIPASE", "AMYLASE" in the last 168 hours. No results for input(s): "AMMONIA" in the last 168 hours. CBC: Recent Labs  Lab 11/19/22 1446 11/22/22 0058  WBC 4.5 0.6*  NEUTROABS 3.0 0.3*  HGB 11.5* 11.0*  HCT 32.7* 32.4*  MCV 94.2 95.6  PLT 170 160   Cardiac Enzymes: No results for input(s): "CKTOTAL", "CKMB", "CKMBINDEX", "TROPONINI" in the last 168 hours.  BNP (last 3 results) No results for input(s): "BNP" in the last 8760 hours.  ProBNP (last 3 results) No results for input(s): "PROBNP" in the last 8760 hours.  CBG: No results for input(s): "GLUCAP" in the last 168 hours.  Radiological Exams on Admission: CT ABDOMEN PELVIS W CONTRAST  Result Date: 11/22/2022 CLINICAL DATA:  Sepsis, weakness, emesis. EXAM: CT ABDOMEN AND PELVIS WITH CONTRAST TECHNIQUE: Multidetector CT imaging of the abdomen and pelvis was performed using the standard protocol following bolus administration of intravenous contrast. RADIATION DOSE REDUCTION: This exam was performed according to the departmental dose-optimization program which includes automated exposure control, adjustment of the mA and/or kV according to patient size and/or use of iterative reconstruction technique. CONTRAST:  137mL OMNIPAQUE IOHEXOL 300 MG/ML  SOLN COMPARISON:  None Available. FINDINGS: Lower chest: Coronary artery calcifications are noted. Atelectasis is noted at the lung bases. Hepatobiliary: A subcentimeter hypodensity is noted in the left lobe of the liver, statistically most likely cyst or hemangioma. Fatty infiltration of the liver is  noted. No biliary ductal dilatation. Stones are present within the gallbladder. Pancreas: Unremarkable. No pancreatic ductal dilatation or surrounding inflammatory changes. Spleen: Normal in size without focal abnormality. Adrenals/Urinary Tract: No adrenal nodule or mass. The kidneys enhance symmetrically. A subcentimeter hypodensity is present in the right kidney which is too small to further characterize. A punctate renal calculus is noted on the left. No hydroureteronephrosis bilaterally. Mild bladder wall thickening is noted on the right lateral wall. Stomach/Bowel: Stomach is within normal limits. Appendix is not seen. No evidence of bowel wall thickening, distention, or inflammatory changes. No free air or pneumatosis. Vascular/Lymphatic: Aortic atherosclerosis. No enlarged abdominal or pelvic lymph nodes. Reproductive: Uterus and bilateral adnexa are unremarkable. Other: No abdominopelvic ascites. Mastectomy changes are noted on the right with multiple foci of air in the subcutaneous soft tissues. No focal fluid collection is seen to suggest abscess. Musculoskeletal: Degenerative changes are present in the thoracolumbar spine. No acute osseous abnormality. IMPRESSION: 1. 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 No abscess is seen. 2. Mild bladder wall thickening, possible infectious or inflammatory cystitis. 3. Hepatic steatosis. 4. Cholelithiasis. 5. Nonobstructive left renal calculus. 6. Aortic atherosclerosis. Electronically Signed   By: Brett Fairy M.D.   On: 11/22/2022 02:46   DG Chest Port 1 View  Result Date: 11/22/2022 CLINICAL DATA:  [id number] Sepsis ([place]) [id number]. Breast cancer, recent chemotherapy. Nausea, vomiting, weakness EXAM: PORTABLE CHEST 1 VIEW COMPARISON:  10/14/2022 FINDINGS: Left Port-A-Cath remains in place, unchanged. Heart and mediastinal contours are within normal limits. No focal opacities or effusions. No acute bony  abnormality. IMPRESSION: No active  cardiopulmonary disease. Electronically Signed   By: Rolm Baptise M.D.   On: 11/22/2022 01:08    Assessment/Plan Principal Problem:   Neutropenic fever ([place]) -patient presents with fever up to about 103, due to being neutropenic from her recent chemotherapy she does not really have any localizing symptoms, she has some urinary urgency and frequency, but she says this is not changed from her baseline due to her overactive bladder. -Inpatient admission -Neutropenic precautions -Gentle IV fluids -Empiric IV vancomycin and cefepime -Follow blood cultures x 2 and urine culture  Postoperative chest wound from mastectomy-does not look overly infected, but will get wound care consult for management of this    BPPV (benign paroxysmal positional vertigo)   Malignant neoplasm of upper-outer quadrant of right breast in female, estrogen receptor positive ([place]) -currently on chemotherapy patient, her oncologist was added to the treatment team   Hyperlipemia   Port-A-Cath in place   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[place])  -diabetic diet when eating, with sliding scale insulin -  will consult coordinator for assistance in management of her insulin   Essential hypertension   UTI (urinary tract infection)   Tachycardia   AKI (acute kidney injury) ([place])   Hypokalemia   Chemotherapy-induced neutropenia ([facility])  DVT prophylaxis: Lovenox   Code Status:  Full Code, discussed with patient at the time of admission.  Consults called: None  Admission status: Inpatient  Time spent: 38 minutes  Karina Latour Neva Seat MD [facility] Pager [phone]  If 7PM-7AM, please contact night-coverage [email or web address] Password [facility]  11/22/2022, 7:23 AM

## 2022-11-22 NOTE — Sepsis Progress Note (Signed)
Elink monitoring for sepsis protocol 

## 2022-11-22 NOTE — Progress Notes (Signed)
Pharmacy Antibiotic Note  Karina Foster is a 82 y.o. female admitted on 11/21/2022 with febrile neutropenia.  Pharmacy has been consulted for vancomycin dosing.  Plan: Vancomycin 1500 mg IV x1 then vancomycin  1000 mg IV q24h ( AUC 487, SCr 1.02 mg/dl, Crcl 44 ml/min)  Adjust dose of to Cefepime 2 gr IV q12h Monitor clinical course, renal function, cultures as available   Height: 5\' 7"  (170.2 cm) Weight: 70.3 kg (155 lb) IBW/kg (Calculated) : 61.6  Temp (24hrs), Avg:101.6 F (38.7 C), Min:100.3 F (37.9 C), Max:102.8 F (39.3 C)  Recent Labs  Lab 11/19/22 1446 11/22/22 0058  WBC 4.5 0.6*  CREATININE 0.94 1.02*  LATICACIDVEN  --  1.0    Estimated Creatinine Clearance: 43.5 mL/min (A) (by C-G formula based on SCr of 1.02 mg/dL (H)).    Allergies  Allergen Reactions   Azithromycin Rash   Demerol [Meperidine] Diarrhea and Nausea And Vomiting    Antimicrobials this admission: 3/18 ceftriaxone x 1  3/18 vancomycin >>  3/18 cefepime >>   Dose adjustments this admission:   Microbiology results: 3/18 BCx:  3/18 UA ordered:  3/18 MRSA PCR:   Thank you for allowing pharmacy to be a part of this patient's care.  Royetta Asal, PharmD, BCPS 11/22/2022 7:47 AM

## 2022-11-22 NOTE — Progress Notes (Signed)
Paged NP on call for positive MRSA results for blood cultures. New orders noted. Educated patient on results of her blood tests. Remains on neutropenic precautions as well as contact precautions for MRSA.

## 2022-11-22 NOTE — ED Provider Notes (Signed)
[facility] Provider Note   CSN: [id number] Arrival date & time: 11/21/22  2348     History  Chief Complaint  Patient presents with   Weakness   Emesis    Karina Foster is a 82 y.o. female.   Weakness Associated symptoms: vomiting   Emesis    82 year old female with medical history significant for breast cancer on chemotherapy, last chemotherapy on Friday who presents to the emergency department with malaise.  The patient states that the day after her round of chemotherapy, she developed nausea and has been significantly fatigued.  She did not know that she was febrile until she came to the emergency department this evening.  She denies any vomiting.  She does endorse some flank pain.  She states that she always has intermittent genitourinary symptoms.  She denies any new chest pain, cough, shortness of breath.  Home Medications Prior to Admission medications   Medication Sig Start Date End Date Taking? Authorizing Provider  acetaminophen (TYLENOL) 500 MG tablet Take 2 tablets (1,000 mg total) by mouth every 6 (six) hours. 10/15/22   Stark Klein, MD  aspirin 325 MG tablet Take 325 mg by mouth daily.    [provider]  BD PEN NEEDLE NANO U/F 32G X 4 MM MISC SMARTSIG:2 Pen Needle SUB-Q Daily    [provider]  Bempedoic Acid-Ezetimibe (NEXLIZET) 180-10 MG TABS Take 1 tablet by mouth daily. 10/24/19   Adrian Prows, MD  citalopram (CELEXA) 40 MG tablet Take 40 mg by mouth daily.    [provider]  Continuous Blood Gluc Sensor (FREESTYLE LIBRE 14 DAY SENSOR) MISC Apply topically every 14 (fourteen) days. 09/12/22   [provider]  Cranberry Soft 500 MG CHEW Chew 500 mg by mouth daily.    [provider]  fluocinonide gel (LIDEX) [id number] % Apply 1 Application topically daily as needed (irritation on tongue).    [provider]  HUMALOG KWIKPEN 100 UNIT/ML KwikPen Inject 30 Units into the  skin in the morning, at noon, and at bedtime. 09/21/19   [provider]  lidocaine-prilocaine (EMLA) cream Apply to affected area once 10/25/22   Nicholas Lose, MD  magic mouthwash (lidocaine, diphenhydrAMINE, alum & mag hydroxide) suspension Swish and spit 5 mLs by mouth 4 (four) times daily as needed for mouth pain. 11/19/22   Nicholas Lose, MD  metoprolol tartrate (LOPRESSOR) 25 MG tablet TAKE 1/2 TABLET TWICE DAILY 03/29/22   Adrian Prows, MD  Multiple Vitamins-Minerals (PRESERVISION AREDS) CAPS Take 1 capsule by mouth in the morning and at bedtime.    [provider]  nitroGLYCERIN (NITROSTAT) 0.4 MG SL tablet Place 0.4 mg under the tongue every 5 (five) minutes as needed for chest pain.    [provider]  prochlorperazine (COMPAZINE) 10 MG tablet Take 1 tablet (10 mg total) by mouth every 6 (six) hours as needed for nausea or vomiting. 10/25/22   Nicholas Lose, MD  ramipril (ALTACE) 2.5 MG capsule Take 2.5 mg by mouth daily.    [provider]  tiZANidine (ZANAFLEX) 2 MG tablet Take 1 tablet (2 mg total) by mouth every 8 (eight) hours as needed for muscle spasms. 10/15/22   Stark Klein, MD  TRESIBA FLEXTOUCH 200 UNIT/ML SOPN Inject 64 Units into the skin daily after lunch. 10/06/19   [provider]  valACYclovir (VALTREX) 500 MG tablet Take 1 tablet (500 mg total) by mouth 2 (two) times daily. 11/19/22   Causey,  Charlestine Massed, NP  vitamin B-12 (CYANOCOBALAMIN) 1000 MCG tablet Take 1,000 mcg by mouth daily.    [provider]      Allergies    Azithromycin and Demerol [meperidine]    Review of Systems   Review of Systems  Gastrointestinal:  Positive for vomiting.  Neurological:  Positive for weakness.  All other systems reviewed and are negative.   Physical Exam Updated Vital Signs BP 133/65   Pulse (!) 109   Temp 100.3 F (37.9 C) (Oral)   Resp 17   Ht 5\' 7"  (1.702 m)   Wt 70.3 kg   SpO2 98%   BMI 24.28 kg/m  Physical  Exam Vitals and nursing note reviewed.  Constitutional:      General: She is not in acute distress.    Appearance: She is well-developed.  HENT:     Head: Normocephalic and atraumatic.     Mouth/Throat:     Mouth: Mucous membranes are dry.  Eyes:     Conjunctiva/sclera: Conjunctivae normal.  Neck:     Comments: No meningismus Cardiovascular:     Rate and Rhythm: Regular rhythm. Tachycardia present.  Pulmonary:     Effort: Pulmonary effort is normal. No respiratory distress.     Breath sounds: Normal breath sounds.  Abdominal:     Palpations: Abdomen is soft.     Tenderness: There is no abdominal tenderness.  Musculoskeletal:        General: No swelling.     Cervical back: Neck supple.  Skin:    General: Skin is warm and dry.     Capillary Refill: Capillary refill takes less than 2 seconds.  Neurological:     Mental Status: She is alert.  Psychiatric:        Mood and Affect: Mood normal.     ED Results / Procedures / Treatments   Labs (all labs ordered are listed, but only abnormal results are displayed) Labs Reviewed  COMPREHENSIVE METABOLIC PANEL - Abnormal; Notable for the following components:      Result Value   Sodium 130 (*)    Potassium 3.4 (*)    CO2 19 (*)    Glucose, Bld 172 (*)    BUN 27 (*)    Creatinine, Ser 1.02 (*)    Calcium 8.4 (*)    AST 14 (*)    Alkaline Phosphatase 35 (*)    GFR, Estimated 56 (*)    All other components within normal limits  CBC WITH DIFFERENTIAL/PLATELET - Abnormal; Notable for the following components:   WBC 0.6 (*)    RBC 3.39 (*)    Hemoglobin 11.0 (*)    HCT 32.4 (*)    Neutro Abs 0.3 (*)    Lymphs Abs 0.1 (*)    All other components within normal limits  RESP PANEL BY RT-PCR (RSV, FLU A&B, COVID)  RVPGX2  CULTURE, BLOOD (ROUTINE X 2)  CULTURE, BLOOD (ROUTINE X 2)  LACTIC ACID, PLASMA  PROTIME-INR  URINALYSIS, ROUTINE W REFLEX MICROSCOPIC    EKG None  Radiology CT ABDOMEN PELVIS W CONTRAST  Result  Date: 11/22/2022 CLINICAL DATA:  Sepsis, weakness, emesis. EXAM: CT ABDOMEN AND PELVIS WITH CONTRAST TECHNIQUE: Multidetector CT imaging of the abdomen and pelvis was performed using the standard protocol following bolus administration of intravenous contrast. RADIATION DOSE REDUCTION: This exam was performed according to the departmental dose-optimization program which includes automated exposure control, adjustment of the mA and/or kV according to patient size and/or use of iterative  reconstruction technique. CONTRAST:  190mL OMNIPAQUE IOHEXOL 300 MG/ML  SOLN COMPARISON:  None Available. FINDINGS: Lower chest: Coronary artery calcifications are noted. Atelectasis is noted at the lung bases. Hepatobiliary: A subcentimeter hypodensity is noted in the left lobe of the liver, statistically most likely cyst or hemangioma. Fatty infiltration of the liver is noted. No biliary ductal dilatation. Stones are present within the gallbladder. Pancreas: Unremarkable. No pancreatic ductal dilatation or surrounding inflammatory changes. Spleen: Normal in size without focal abnormality. Adrenals/Urinary Tract: No adrenal nodule or mass. The kidneys enhance symmetrically. A subcentimeter hypodensity is present in the right kidney which is too small to further characterize. A punctate renal calculus is noted on the left. No hydroureteronephrosis bilaterally. Mild bladder wall thickening is noted on the right lateral wall. Stomach/Bowel: Stomach is within normal limits. Appendix is not seen. No evidence of bowel wall thickening, distention, or inflammatory changes. No free air or pneumatosis. Vascular/Lymphatic: Aortic atherosclerosis. No enlarged abdominal or pelvic lymph nodes. Reproductive: Uterus and bilateral adnexa are unremarkable. Other: No abdominopelvic ascites. Mastectomy changes are noted on the right with multiple foci of air in the subcutaneous soft tissues. No focal fluid collection is seen to suggest abscess.  Musculoskeletal: Degenerative changes are present in the thoracolumbar spine. No acute osseous abnormality. IMPRESSION: 1. 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 No abscess is seen. 2. Mild bladder wall thickening, possible infectious or inflammatory cystitis. 3. Hepatic steatosis. 4. Cholelithiasis. 5. Nonobstructive left renal calculus. 6. Aortic atherosclerosis. Electronically Signed   By: Brett Fairy M.D.   On: 11/22/2022 02:46   DG Chest Port 1 View  Result Date: 11/22/2022 CLINICAL DATA:  [id number] Sepsis ([place]) [id number]. Breast cancer, recent chemotherapy. Nausea, vomiting, weakness EXAM: PORTABLE CHEST 1 VIEW COMPARISON:  10/14/2022 FINDINGS: Left Port-A-Cath remains in place, unchanged. Heart and mediastinal contours are within normal limits. No focal opacities or effusions. No acute bony abnormality. IMPRESSION: No active cardiopulmonary disease. Electronically Signed   By: Rolm Baptise M.D.   On: 11/22/2022 01:08    Procedures .Critical Care  Performed by: Regan Lemming, MD Authorized by: Regan Lemming, MD   Critical care provider statement:    Critical care time (minutes):  45   Critical care was necessary to treat or prevent imminent or life-threatening deterioration of the following conditions:  Sepsis   Critical care was time spent personally by me on the following activities:  Development of treatment plan with patient or surrogate, discussions with consultants, evaluation of patient's response to treatment, examination of patient, ordering and review of laboratory studies, ordering and review of radiographic studies, ordering and performing treatments and interventions, pulse oximetry, re-evaluation of patient's condition and review of old charts   Care discussed with: admitting provider       Medications Ordered in ED Medications  vancomycin (VANCOREADY) IVPB 1500 mg/300 mL (has no administration in time range)  ceFEPIme  (MAXIPIME) 2 g in sodium chloride 0.9 % 100 mL IVPB (2 g Intravenous New Bag/Given 11/22/22 0247)  lactated ringers bolus 1,000 mL (0 mLs Intravenous Stopped 11/22/22 0241)  cefTRIAXone (ROCEPHIN) 1 g in sodium chloride 0.9 % 100 mL IVPB (0 g Intravenous Stopped 11/22/22 0141)  iohexol (OMNIPAQUE) 300 MG/ML solution 100 mL (100 mLs Intravenous Contrast Given 11/22/22 0231)    ED Course/ Medical Decision Making/ A&P Clinical Course as of 11/22/22 0254  Mon Nov 22, 2022  0020 Temp(!): 102.8 F (39.3 C) [JL]  0020 Pulse Rate(!):  109 [JL]  0230 NEUT#(!!): 0.3 [JL]  0231 WBC(!!): 0.6 [JL]    Clinical Course User Index [JL] Regan Lemming, MD                             Medical Decision Making Amount and/or Complexity of Data Reviewed Labs: ordered. Decision-making details documented in ED Course. Radiology: ordered.  Risk Prescription drug management. Decision regarding hospitalization.     82 year old female with medical history significant for breast cancer on chemotherapy, last chemotherapy on Friday who presents to the emergency department with malaise.  The patient states that the day after her round of chemotherapy, she developed nausea and has been significantly fatigued.  She did not know that she was febrile until she came to the emergency department this evening.  She denies any vomiting.  She does endorse some flank pain.  She states that she always has intermittent genitourinary symptoms.  She denies any new chest pain, cough, shortness of breath.   On arrival, the patient was febrile to 102.8, tachycardic heart rate 109, RR 19, BP 135/61, saturating 95% on room air.  Sinus tachycardia noted on cardiac telemetry.  Physical exam significant for an abdomen that was soft, nontender, nondistended, no rebound or guarding.  Lungs clear to auscultation bilaterally, dry mucous membranes noted.  No meningismus.  Patient was tachycardic on exam.  My immediate concern is for a  life-threatening infection in this patient.   The most likely infectious source is genitourinary vs respiratory vs intraabdominal.  I have a low suspicion for meningitis, osteomyelitis, endocarditis, proctitis. No open wounds or evidence of cellulitis.  Labs: CBC significant for neutropenia with a WBC of 0.6, ANC of 100, lactic acid 1.0, COVID-19, influenza, RSV PCR testing negative, CMP with mild hyperglycemia to 172 without anion gap acidosis with bicarbonate of 19, anion gap 10, LFTs normal, T. bili normal, creatinine stable at 1.02, blood cultures collected prior to antibiotic administration.  The patient was fluid resuscitated with 1 L LR bolus.  Initially prior to CBC results empirically covered for UTI/pyelonephritis with Rocephin, subsequently broadened to vancomycin and cefepime due to concern for neutropenic fever.   Patient was not able to urinate, In-N-Out cath ordered.  Imaging: A chest x-ray was performed which revealed no airspace disease.  A CT abdomen pelvis revealed the following: IMPRESSION:  1. 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 No abscess is seen.  2. Mild bladder wall thickening, possible infectious or inflammatory  cystitis.  3. Hepatic steatosis.  4. Cholelithiasis.  5. Nonobstructive left renal calculus.  6. Aortic atherosclerosis.   Upon reassessment, the patient appears clinically improved. The patient is not currently hypotensive and has a MAP of >65. The patient's volume status appears to be improved after fluid resuscitation.  Based off the presentation and lab findings, the patient meets criteria for sepsis.  [SEVERE SEPSIS] HYPOTENSION (Nursing BPA for hypotension = Is this sepsis?) CREATININE > 2.0, or urine output < 0.5 mL/kg/hour for 2 hours  BILIRUBIN > 2 mg/dL (34.2 mmol/L)  PLATELET COUNT < 100,000  INR > 1.5 or aPTT > 60 sec  LACTATE > 2 mmol/L   [SEPTIC SHOCK] HYPOTENSION REFRACTORY TO  30 cc/kg OF FLUIDS LACTATE > 4 mmol/L  While in the ED, the patient was given: Medications  vancomycin (VANCOREADY) IVPB 1500 mg/300 mL (has no administration in time range)  ceFEPIme (MAXIPIME) 2 g  in sodium chloride 0.9 % 100 mL IVPB (2 g Intravenous New Bag/Given 11/22/22 0247)  lactated ringers bolus 1,000 mL (0 mLs Intravenous Stopped 11/22/22 0241)  cefTRIAXone (ROCEPHIN) 1 g in sodium chloride 0.9 % 100 mL IVPB (0 g Intravenous Stopped 11/22/22 0141)  iohexol (OMNIPAQUE) 300 MG/ML solution 100 mL (100 mLs Intravenous Contrast Given 11/22/22 0231)     Silvio Clayman was admitted to the hospital for ongoing treatment and monitoring, Dr. Bridgett Larsson accepting.   Final Clinical Impression(s) / ED Diagnoses Final diagnoses:  Neutropenic fever ([place])  Cystitis    Rx / DC Orders ED Discharge Orders     None         Regan Lemming, MD 11/22/22 0302

## 2022-11-22 NOTE — ED Notes (Signed)
Patient transported to CT 

## 2022-11-22 NOTE — Progress Notes (Signed)
A consult was received from an ED physician for Vancomycin and Cefepime per pharmacy dosing.  The patient's profile has been reviewed for ht/wt/allergies/indication/available labs.    A one time order has been placed for Vancomycin 1500mg  IV and Cefepime 2gm IV.    Further antibiotics/pharmacy consults should be ordered by admitting physician if indicated.                       Thank you, Everette Rank, PharmD 11/22/2022  2:34 AM

## 2022-11-22 NOTE — ED Triage Notes (Signed)
Recent breast cancer diagnosis. Had chemo on Friday and has had N/V since. Pt also endorses weakness.

## 2022-11-22 NOTE — ED Notes (Signed)
ED TO INPATIENT HANDOFF REPORT  Name/Age/Gender Karina Foster 82 y.o. female  Code Status Code Status History     Date Active Date Inactive Code Status Order ID Comments User Context   10/14/2022 1311 10/15/2022 1719 Full Code [id number]  Stark Klein, MD Inpatient    Questions for Most Recent Historical Code Status (Order [id number])     Question Answer   By: Other                Advance Directive Documentation    Flowsheet Row Most Recent Value  Type of Advance Directive Healthcare Power of Attorney, Living will  Pre-existing out of facility DNR order (yellow form or pink MOST form) --  "MOST" Form in Place? --       Home/SNF/Other Home  Chief Complaint Neutropenic fever ([place]) [D70.9, R50.81]  Level of Care/Admitting Diagnosis ED Disposition     ED Disposition  Admit   Condition  --   [place]: [place] [[id number]]  Level of Care: Med-Surg [16]  May admit patient to [facility] or [facility] if equivalent level of care is available:: No  Covid Evaluation: Confirmed COVID Negative  Diagnosis: Neutropenic fever [facility]) [id number]  Admitting Physician: Jackelyn Knife [id number]  Attending Physician: Regan Lemming [id number]  Certification:: I certify this patient will need inpatient services for at least 2 midnights  Estimated Length of Stay: 4          Medical History Past Medical History:  Diagnosis Date   Allergic rhinitis, seasonal    Arthritis    hands   Asthma    Years ago due to a cat. 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    GERD (gastroesophageal reflux disease)    History of benign neoplasm of tongue    per pt has had several bx's of lesions   History of cancer chemotherapy 1997   right breast cancer   History of external beam radiation therapy 1997   right breast cancer   History of iron deficiency anemia    History of non-ST elevation myocardial infarction (NSTEMI) 12/14/2008   History of right breast cancer 1997   s/p  right partial masectomy node dissection's,  completed chemo and radiation same year ,   no recurrence since   Hyperlipidemia    Mood disorder ([place])    Myocardial infarction ([place]) 2010   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   OAB (overactive bladder)    OSA (obstructive sleep apnea) 2016   sleep study in epic 10-04-2014, AHI 22.7/ hr;  (06-17-2022  pt stated has not used dental appliance in few years, does not fit )   Peripheral neuropathy    feet   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/2008   x2  to proximal and mid LAD   Urinary incontinence, mixed  urologist--- dr pace   Wears glasses    Wears hearing aid in both ears     Allergies Allergies  Allergen Reactions   Azithromycin Rash   Demerol [Meperidine] Diarrhea and Nausea And Vomiting    IV Location/Drains/Wounds Patient Lines/Drains/Airways Status     Active Line/Drains/Airways     Name Placement date Placement time Site Days   Implanted Port 10/14/22 Left Chest 10/14/22  0000  Chest  39   Peripheral IV 10/14/22 20 G 1.16" Left Hand 10/14/22  0700  Hand  39   Peripheral IV 11/22/22 20 G 1" Anterior;Left Forearm 11/22/22  0051  Forearm  less than 1   Closed System Drain 1 Breast Bulb ([place]) 19 Fr. 10/14/22  0958  Breast  39            Labs/Imaging Results for orders placed or performed during the hospital encounter of 11/21/22 (from  the past 48 hour(s))  Urinalysis, Routine w reflex microscopic -Urine, Clean Catch     Status: Abnormal   Collection Time: 11/22/22 12:11 AM  Result Value Ref Range   Color, Urine YELLOW YELLOW   APPearance CLEAR CLEAR   Specific Gravity, Urine >1.046 (H) 1.005 - 1.030   pH 5.0 5.0 - 8.0   Glucose, UA 50 (A) NEGATIVE mg/dL   Hgb urine dipstick SMALL (A) NEGATIVE   Bilirubin Urine NEGATIVE NEGATIVE   Ketones, ur 20 (A) NEGATIVE mg/dL   Protein, ur 30 (A) NEGATIVE mg/dL   Nitrite NEGATIVE NEGATIVE   Leukocytes,Ua NEGATIVE NEGATIVE   RBC / HPF 6-10 0 - 5 RBC/hpf   WBC, UA 0-5 0 - 5 WBC/hpf   Bacteria, UA NONE SEEN NONE SEEN   Squamous Epithelial / HPF 0-5 0 - 5 /HPF    Comment: Performed at [facility], [place] [address], [place], [place] [address]  Comprehensive metabolic panel     Status: Abnormal   Collection Time: 11/22/22 12:58 AM  Result Value Ref Range   Sodium 130 (L) 135 - 145 mmol/L   Potassium 3.4 (L) 3.5 - 5.1 mmol/L   Chloride 101 98 - 111 mmol/L   CO2 19 (L) 22 - 32 mmol/L   Glucose, Bld 172 (H) 70 - 99 mg/dL    Comment: Glucose reference range applies only to samples taken after fasting for at least 8 hours.   BUN 27 (H) 8 - 23 mg/dL   Creatinine, Ser 1.02 (H) 0.44 - 1.00 mg/dL   Calcium 8.4 (L) 8.9 - 10.3 mg/dL   Total Protein 7.5 6.5 - 8.1 g/dL   Albumin 4.0 3.5 - 5.0 g/dL   AST 14 (L) 15 - 41 U/L   ALT 16 0 - 44 U/L   Alkaline Phosphatase 35 (L) 38 - 126 U/L   Total Bilirubin 1.0 0.3 - 1.2 mg/dL   GFR, Estimated 56 (L) >60 mL/min    Comment: (NOTE) Calculated using the CKD-EPI Creatinine Equation (2021)    Anion gap 10 5 - 15    Comment: Performed at [facility], [place] [address], [place], [place] [address]  Lactic acid, plasma     Status: None   Collection Time: 11/22/22 12:58 AM  Result Value Ref Range   Lactic Acid, Venous 1.0 0.5 - 1.9 mmol/L    Comment: Performed at [facility], [place] [address], [place], [place] [address]  CBC with Differential     Status: Abnormal   Collection Time: 11/22/22 12:58 AM  Result Value Ref Range   WBC 0.6 (LL) 4.0 - 10.5 K/uL    Comment: REPEATED TO VERIFY WHITE COUNT CONFIRMED ON SMEAR THIS CRITICAL RESULT HAS VERIFIED AND BEEN CALLED TO BERRIER,C RN BY GOLSON,M ON 03 18 2024 AT 0222, AND HAS BEEN READ BACK.     RBC 3.39 (L) 3.87 - 5.11 MIL/uL   Hemoglobin 11.0 (L) 12.0 - 15.0 g/dL   HCT 32.4 (L) 36.0 - 46.0 %   MCV 95.6 80.0 - 100.0 fL   MCH 32.4 26.0 - 34.0 pg   MCHC 34.0 30.0 - 36.0 g/dL   RDW 14.1 11.5 - 15.5 %   Platelets 160 150 - 400 K/uL   nRBC 0.0 0.0 - 0.2 %   Neutrophils Relative % 56 %   Neutro Abs 0.3 (LL) 1.7 - 7.7 K/uL    Comment: This critical result has verified and been called to [place] by GOLSON,M on 03 18 2024 at 0223, and has been read back.    Lymphocytes Relative 22 %   Lymphs Abs 0.1 (L) 0.7 - 4.0 K/uL   Monocytes Relative 16 %   Monocytes Absolute 0.1 0.1 - 1.0 K/uL   Eosinophils Relative 2 %   Eosinophils Absolute 0.0 0.0 - 0.5 K/uL   Basophils Relative 2 %   Basophils Absolute 0.0 0.0 - 0.1 K/uL   Immature Granulocytes 2 %   Abs Immature Granulocytes 0.01 0.00 - 0.07 K/uL   Polychromasia PRESENT    Ovalocytes PRESENT     Comment: Performed at [facility], [place] [address] [place], [place] [address]  Protime-INR     Status: None   Collection Time: 11/22/22 12:58 AM  Result Value Ref Range   Prothrombin Time 14.9 11.4 - 15.2 seconds   INR 1.2 0.8 - 1.2    Comment: (NOTE) INR goal varies based on device and disease states. Performed at [facility] [place] [address] [place], [place] [address]   Resp panel by RT-PCR (RSV, Flu A&B, Covid)     Status: None   Collection Time: 11/22/22  1:20 AM   Specimen: Nasal Swab  Result Value Ref Range   SARS Coronavirus 2 by RT PCR NEGATIVE NEGATIVE    Comment: (NOTE) SARS-CoV-2 target nucleic acids are NOT DETECTED.  The  SARS-CoV-2 RNA is generally detectable in upper respiratory specimens during the acute phase of infection. The lowest concentration of SARS-CoV-2 viral copies this assay can detect is 138 copies/mL. A negative result does not preclude SARS-Cov-2 infection and should not be used as the sole basis for treatment or other patient management decisions. A negative result may occur with  improper specimen collection/handling, submission of specimen other than nasopharyngeal swab, presence of viral mutation(s) within the areas targeted by this assay, and inadequate number of viral copies(<138 copies/mL). A negative result must be combined with clinical observations, patient history, and epidemiological information. The expected result is Negative.  Fact Sheet for Patients:  EntrepreneurPulse.com.au  Fact Sheet for Healthcare Providers:  IncredibleEmployment.be  This test is no t yet approved or cleared by the [place] FDA and  has been authorized for detection and/or diagnosis of SARS-CoV-2 by FDA under an Emergency Use Authorization (EUA). This EUA will remain  in effect (meaning this test can be used) for the duration of the COVID-19 declaration under Section 564(b)(1) of the Act, 21 U.S.C.section 360bbb-3(b)(1), unless the authorization is terminated  or revoked sooner.       Influenza A  by PCR NEGATIVE NEGATIVE   Influenza B by PCR NEGATIVE NEGATIVE    Comment: (NOTE) The Xpert Xpress SARS-CoV-2/FLU/RSV plus assay is intended as an aid in the diagnosis of influenza from Nasopharyngeal swab specimens and should not be used as a sole basis for treatment. Nasal washings and aspirates are unacceptable for Xpert Xpress SARS-CoV-2/FLU/RSV testing.  Fact Sheet for Patients: EntrepreneurPulse.com.au  Fact Sheet for Healthcare Providers: IncredibleEmployment.be  This test is not yet approved or cleared by the  [place] FDA and has been authorized for detection and/or diagnosis of SARS-CoV-2 by FDA under an Emergency Use Authorization (EUA). This EUA will remain in effect (meaning this test can be used) for the duration of the COVID-19 declaration under Section 564(b)(1) of the Act, 21 U.S.C. section 360bbb-3(b)(1), unless the authorization is terminated or revoked.     Resp Syncytial Virus by PCR NEGATIVE NEGATIVE    Comment: (NOTE) Fact Sheet for Patients: EntrepreneurPulse.com.au  Fact Sheet for Healthcare Providers: IncredibleEmployment.be  This test is not yet approved or cleared by the [place] FDA and has been authorized for detection and/or diagnosis of SARS-CoV-2 by FDA under an Emergency Use Authorization (EUA). This EUA will remain in effect (meaning this test can be used) for the duration of the COVID-19 declaration under Section 564(b)(1) of the Act, 21 U.S.C. section 360bbb-3(b)(1), unless the authorization is terminated or revoked.  Performed at [facility], [place] [address], [place], [place] [address]    CT ABDOMEN PELVIS W CONTRAST  Result Date: 11/22/2022 CLINICAL DATA:  Sepsis, weakness, emesis. EXAM: CT ABDOMEN AND PELVIS WITH CONTRAST TECHNIQUE: Multidetector CT imaging of the abdomen and pelvis was performed using the standard protocol following bolus administration of intravenous contrast. RADIATION DOSE REDUCTION: This exam was performed according to the departmental dose-optimization program which includes automated exposure control, adjustment of the mA and/or kV according to patient size and/or use of iterative reconstruction technique. CONTRAST:  174mL OMNIPAQUE IOHEXOL 300 MG/ML  SOLN COMPARISON:  None Available. FINDINGS: Lower chest: Coronary artery calcifications are noted. Atelectasis is noted at the lung bases. Hepatobiliary: A subcentimeter hypodensity is noted in the left lobe of the liver,  statistically most likely cyst or hemangioma. Fatty infiltration of the liver is noted. No biliary ductal dilatation. Stones are present within the gallbladder. Pancreas: Unremarkable. No pancreatic ductal dilatation or surrounding inflammatory changes. Spleen: Normal in size without focal abnormality. Adrenals/Urinary Tract: No adrenal nodule or mass. The kidneys enhance symmetrically. A subcentimeter hypodensity is present in the right kidney which is too small to further characterize. A punctate renal calculus is noted on the left. No hydroureteronephrosis bilaterally. Mild bladder wall thickening is noted on the right lateral wall. Stomach/Bowel: Stomach is within normal limits. Appendix is not seen. No evidence of bowel wall thickening, distention, or inflammatory changes. No free air or pneumatosis. Vascular/Lymphatic: Aortic atherosclerosis. No enlarged abdominal or pelvic lymph nodes. Reproductive: Uterus and bilateral adnexa are unremarkable. Other: No abdominopelvic ascites. Mastectomy changes are noted on the right with multiple foci of air in the subcutaneous soft tissues. No focal fluid collection is seen to suggest abscess. Musculoskeletal: Degenerative changes are present in the thoracolumbar spine. No acute osseous abnormality. IMPRESSION: 1. 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 No abscess is seen. 2. Mild bladder wall thickening, possible infectious or inflammatory cystitis. 3. Hepatic steatosis. 4. Cholelithiasis. 5. Nonobstructive left renal calculus. 6. Aortic atherosclerosis. Electronically Signed   By: Mickel Baas  Lovena Le M.D.   On: 11/22/2022 02:46   DG Chest Port 1 View  Result Date: 11/22/2022 CLINICAL DATA:  [id number] Sepsis ([place]) [id number]. Breast cancer, recent chemotherapy. Nausea, vomiting, weakness EXAM: PORTABLE CHEST 1 VIEW COMPARISON:  10/14/2022 FINDINGS: Left Port-A-Cath remains in place, unchanged. Heart and mediastinal  contours are within normal limits. No focal opacities or effusions. No acute bony abnormality. IMPRESSION: No active cardiopulmonary disease. Electronically Signed   By: Rolm Baptise M.D.   On: 11/22/2022 01:08    Pending Labs Unresulted Labs (From admission, onward)     Start     Ordered   11/22/22 0011  Culture, blood (Routine x 2)  BLOOD CULTURE X 2,   R (with STAT occurrences)      11/22/22 0010            Vitals/Pain Today's Vitals   11/22/22 0245 11/22/22 0250 11/22/22 0300 11/22/22 0600  BP: 133/65  (!) 133/58 118/68  Pulse: (!) 109  (!) 108 (!) 107  Resp:   16 20  Temp:  100.3 F (37.9 C)    TempSrc:  Oral    SpO2: 98%  91% 94%  Weight:      Height:      PainSc:        Isolation Precautions Airborne and Contact precautions  Medications Medications  lactated ringers bolus 1,000 mL (0 mLs Intravenous Stopped 11/22/22 0241)  cefTRIAXone (ROCEPHIN) 1 g in sodium chloride 0.9 % 100 mL IVPB (0 g Intravenous Stopped 11/22/22 0141)  iohexol (OMNIPAQUE) 300 MG/ML solution 100 mL (100 mLs Intravenous Contrast Given 11/22/22 0231)  vancomycin (VANCOREADY) IVPB 1500 mg/300 mL (0 mg Intravenous Stopped 11/22/22 0616)  ceFEPIme (MAXIPIME) 2 g in sodium chloride 0.9 % 100 mL IVPB (0 g Intravenous Stopped 11/22/22 0336)    Mobility walks with person assist

## 2022-11-23 DIAGNOSIS — N3000 Acute cystitis without hematuria: Secondary | ICD-10-CM | POA: Diagnosis not present

## 2022-11-23 DIAGNOSIS — E876 Hypokalemia: Secondary | ICD-10-CM

## 2022-11-23 DIAGNOSIS — Z170 Estrogen receptor positive status [ER+]: Secondary | ICD-10-CM

## 2022-11-23 DIAGNOSIS — D709 Neutropenia, unspecified: Secondary | ICD-10-CM | POA: Diagnosis not present

## 2022-11-23 DIAGNOSIS — C50411 Malignant neoplasm of upper-outer quadrant of right female breast: Secondary | ICD-10-CM | POA: Diagnosis not present

## 2022-11-23 LAB — COMPREHENSIVE METABOLIC PANEL
ALT: 15 U/L (ref 0–44)
AST: 14 U/L — ABNORMAL LOW (ref 15–41)
Albumin: 2.9 g/dL — ABNORMAL LOW (ref 3.5–5.0)
Alkaline Phosphatase: 31 U/L — ABNORMAL LOW (ref 38–126)
Anion gap: 7 (ref 5–15)
BUN: 19 mg/dL (ref 8–23)
CO2: 21 mmol/L — ABNORMAL LOW (ref 22–32)
Calcium: 7.6 mg/dL — ABNORMAL LOW (ref 8.9–10.3)
Chloride: 103 mmol/L (ref 98–111)
Creatinine, Ser: 0.78 mg/dL (ref 0.44–1.00)
GFR, Estimated: >60 mL/min (ref >60)
Glucose, Bld: 121 mg/dL — ABNORMAL HIGH (ref 70–99)
Potassium: 3.1 mmol/L — ABNORMAL LOW (ref 3.5–5.1)
Sodium: 131 mmol/L — ABNORMAL LOW (ref 135–145)
Total Bilirubin: 0.5 mg/dL (ref 0.3–1.2)
Total Protein: 6.4 g/dL — ABNORMAL LOW (ref 6.5–8.1)

## 2022-11-23 LAB — CBC
HCT: 30.9 % — ABNORMAL LOW (ref 36.0–46.0)
Hemoglobin: 10.1 g/dL — ABNORMAL LOW (ref 12.0–15.0)
MCH: 31.7 pg (ref 26.0–34.0)
MCHC: 32.7 g/dL (ref 30.0–36.0)
MCV: 96.9 fL (ref 80.0–100.0)
Platelets: 135 10*3/uL — ABNORMAL LOW (ref 150–400)
RBC: 3.19 MIL/uL — ABNORMAL LOW (ref 3.87–5.11)
RDW: 14 % (ref 11.5–15.5)
WBC: 0.5 10*3/uL — CL (ref 4.0–10.5)
nRBC: 0 % (ref 0.0–0.2)

## 2022-11-23 LAB — DIFFERENTIAL
Abs Immature Granulocytes: 0 10*3/uL (ref 0.00–0.07)
Basophils Absolute: 0 10*3/uL (ref 0.0–0.1)
Basophils Relative: 0 %
Eosinophils Absolute: 0.1 10*3/uL (ref 0.0–0.5)
Eosinophils Relative: 23 %
Lymphocytes Relative: 31 %
Lymphs Abs: 0.2 10*3/uL — ABNORMAL LOW (ref 0.7–4.0)
Monocytes Absolute: 0 10*3/uL — ABNORMAL LOW (ref 0.1–1.0)
Monocytes Relative: 8 %
Neutro Abs: 0.2 10*3/uL — CL (ref 1.7–7.7)
Neutrophils Relative %: 38 %

## 2022-11-23 LAB — GLUCOSE, CAPILLARY
Glucose-Capillary: 113 mg/dL — ABNORMAL HIGH (ref 70–99)
Glucose-Capillary: 155 mg/dL — ABNORMAL HIGH (ref 70–99)
Glucose-Capillary: 140 mg/dL — ABNORMAL HIGH (ref 70–99)
Glucose-Capillary: 157 mg/dL — ABNORMAL HIGH (ref 70–99)

## 2022-11-23 LAB — MAGNESIUM: Magnesium: 2.1 mg/dL (ref 1.7–2.4)

## 2022-11-23 MED ORDER — LIDOCAINE VISCOUS HCL 2 % MT SOLN: 5.0000 mL | Freq: Four times a day (QID) | OROMUCOSAL | Status: DC | PRN

## 2022-11-23 MED ORDER — MAGIC MOUTHWASH W/LIDOCAINE: 5.0000 mL | Freq: Four times a day (QID) | ORAL | Status: DC | PRN

## 2022-11-23 MED ORDER — TBO-FILGRASTIM 300 MCG/0.5ML ~~LOC~~ SOSY
300.0000 ug | PREFILLED_SYRINGE | Freq: Every day | SUBCUTANEOUS | Status: DC
  Administered 2022-11-23 – 2022-11-24 (×2): 300 ug via SUBCUTANEOUS
  Filled 2022-11-23 (×2): qty 0.5

## 2022-11-23 MED ORDER — SODIUM CHLORIDE 0.9 % IV SOLN: INTRAVENOUS | Status: AC

## 2022-11-23 MED ORDER — POTASSIUM CHLORIDE CRYS ER 20 MEQ PO TBCR
40.0000 meq | EXTENDED_RELEASE_TABLET | ORAL | Status: AC
  Administered 2022-11-23 (×2): 40 meq via ORAL
  Filled 2022-11-23 (×2): qty 2

## 2022-11-23 NOTE — Consult Note (Signed)
[facility] Nurse Consult Note: Reason for Consult: Consult requested for right outer upper chest post-op wound.  Pt states she is followed by Dr Barry Dienes of the surgical team prior to admission and recently had surgery performed on 2/26. Wound type: Full thickness wound is beefy red, 2X4X1cm, 5 cm tunneling at 12:00 o'clock, small amt pink drainage. Outer wound with steristrips in place.  Dressing procedure/placement/frequency: Topical treatment orders provided for bedside nurses to perform as was the previous plan of care, according to the patient: Pack right upper chest wound Q day with moist kerlex, using swab to fill Q day.  Moisten previous packing each time and remove slowly to avoid causing bleeding. Cover with 2 ABD pads and tape, and hold in place with patient's top or mesh underwear with the crotch cut out to create a "tank top." Leave steristips in place until they fall off.  Secure chat message sent to the primary team: "The patient had an appointment with Dr Barry Dienes of the surgical team for her right upper chest wound which she missed related to this admission. she would like Dr Barry Dienes to see her while she is in the hospital if possible." Please re-consult if further assistance is needed.  Thank-you,  Julien Girt MSN, [place], [place], [place], [place]

## 2022-11-23 NOTE — Assessment & Plan Note (Signed)
-   UA negative but questionable symptoms - continue empiric coverage for now

## 2022-11-23 NOTE — Assessment & Plan Note (Signed)
-   s/p right mastectomy 10/14/22; follows with surgery and oncology outpatient - Appreciate general surgery evaluation during hospitalization due to concern for possible wound dehiscence -Continue outpatient follow-up with oncology - Continue treatment for right breast cellulitis and cover for MRSA with likely Doxy on discharge

## 2022-11-23 NOTE — Assessment & Plan Note (Addendum)
-   ANC about 190 on 11/23/22 - s/p cyclophosphamide (nadir ~ 2 weeks), fluorouracil (nadir 9-14 days), methotrexate on 11/12/22.  - discussed with Dr. Lindi Adie. Give granix until ANC>1000 - CBC w/ diff daily - continue neutropenic precautions

## 2022-11-23 NOTE — Plan of Care (Signed)
  Problem: Health Behavior/Discharge Planning: Goal: Ability to manage health-related needs will improve Outcome: Progressing   Problem: Clinical Measurements: Goal: Ability to maintain clinical measurements within normal limits will improve Outcome: Progressing   Problem: Clinical Measurements: Goal: Will remain free from infection Outcome: Progressing   

## 2022-11-23 NOTE — Assessment & Plan Note (Signed)
-   Continue SSI and CBG monitoring ?

## 2022-11-23 NOTE — Progress Notes (Signed)
Progress Note     Subjective: Patient is a 82 year old female with recurrent breast cancer who underwent right mastectomy 10/14/22 with Dr. Barry Dienes. She was admitted yesterday with neutropenic fever up to 102.8 at [place] where she lives with her husband. She has a right axillary wound that had opened at lateral incision and they were packing the wound. Patient's wound had opened up 3/12 and had some drainage and she was seen by RN in the office. Instructed on packing and was scheduled for follow up with Dr. Barry Dienes yesterday. She denies significant pain to right axillary wound. She reports some drainage. Tmax here 103.2 yesterday. No other current source of fever/infection apparent.   Objective: Vital signs in last 24 hours: Temp:  [98.3 F (36.8 C)-103.2 F (39.6 C)] 99.5 F (37.5 C) (03/19 0642) Pulse Rate:  [87-100] 92 (03/19 1003) Resp:  [14-22] 16 (03/19 0642) BP: (96-119)/(48-64) 100/64 (03/19 1003) SpO2:  [96 %-100 %] 97 % (03/19 0642) Last BM Date : 11/22/22  Intake/Output from previous day: 03/18 0701 - 03/19 0700 In: 1320 [P.O.:120; I.V.:900; IV Piggyback:300] Out: -  Intake/Output this shift: Total I/O In: 120 [P.O.:120] Out: -   PE: General: pleasant, WD, elderly female who is laying in bed in NAD HEENT: head is normocephalic, atraumatic.  EOMI.  Heart: regular, rate, and rhythm.   Lungs: CTAB, no wheezes, rhonchi, or rales noted.  Respiratory effort nonlabored Breast: incision medially C/D/I with steri-strips present, right axillary wound measures 5 cm x 3 cm and tracks 3.5 cm superolaterally and 5 cm inferolaterally, some cloudy thin drainage expressed on probing of inferior wound, some surrounding erythema present but no fluctuance noted   Abd: soft, NT, ND MS: all 4 extremities are symmetrical with no cyanosis, clubbing, or edema. Skin: warm and dry with no masses, lesions, or rashes Neuro: Cranial nerves 2-12 grossly intact, sensation is normal  throughout Psych: A&Ox3 with an appropriate affect.    Lab Results:  Recent Labs    11/22/22 0058 11/23/22 0701  WBC 0.6* 0.5*  HGB 11.0* 10.1*  HCT 32.4* 30.9*  PLT 160 135*   BMET Recent Labs    11/22/22 0058 11/23/22 0701  NA 130* 131*  K 3.4* 3.1*  CL 101 103  CO2 19* 21*  GLUCOSE 172* 121*  BUN 27* 19  CREATININE 1.02* 0.78  CALCIUM 8.4* 7.6*   PT/INR Recent Labs    11/22/22 0058  LABPROT 14.9  INR 1.2   CMP     Component Value Date/Time   NA 131 (L) 11/23/2022 0701   K 3.1 (L) 11/23/2022 0701   CL 103 11/23/2022 0701   CO2 21 (L) 11/23/2022 0701   GLUCOSE 121 (H) 11/23/2022 0701   BUN 19 11/23/2022 0701   CREATININE 0.78 11/23/2022 0701   CREATININE 0.94 11/19/2022 1446   CALCIUM 7.6 (L) 11/23/2022 0701   PROT 6.4 (L) 11/23/2022 0701   ALBUMIN 2.9 (L) 11/23/2022 0701   AST 14 (L) 11/23/2022 0701   AST 11 (L) 11/19/2022 1446   ALT 15 11/23/2022 0701   ALT 17 11/19/2022 1446   ALKPHOS 31 (L) 11/23/2022 0701   BILITOT 0.5 11/23/2022 0701   BILITOT 0.6 11/19/2022 1446   GFRNONAA >60 11/23/2022 0701   GFRNONAA >60 11/19/2022 1446   GFRAA  12/18/2008 0505    >60        The eGFR has been calculated using the MDRD equation. This calculation has not been validated in all clinical situations.  eGFR's persistently <60 mL/min signify possible Chronic Kidney Disease.   Lipase  No results found for: "LIPASE"     Studies/Results: CT ABDOMEN PELVIS W CONTRAST  Result Date: 11/22/2022 CLINICAL DATA:  Sepsis, weakness, emesis. EXAM: CT ABDOMEN AND PELVIS WITH CONTRAST TECHNIQUE: Multidetector CT imaging of the abdomen and pelvis was performed using the standard protocol following bolus administration of intravenous contrast. RADIATION DOSE REDUCTION: This exam was performed according to the departmental dose-optimization program which includes automated exposure control, adjustment of the mA and/or kV according to patient size and/or use of iterative  reconstruction technique. CONTRAST:  169mL OMNIPAQUE IOHEXOL 300 MG/ML  SOLN COMPARISON:  None Available. FINDINGS: Lower chest: Coronary artery calcifications are noted. Atelectasis is noted at the lung bases. Hepatobiliary: A subcentimeter hypodensity is noted in the left lobe of the liver, statistically most likely cyst or hemangioma. Fatty infiltration of the liver is noted. No biliary ductal dilatation. Stones are present within the gallbladder. Pancreas: Unremarkable. No pancreatic ductal dilatation or surrounding inflammatory changes. Spleen: Normal in size without focal abnormality. Adrenals/Urinary Tract: No adrenal nodule or mass. The kidneys enhance symmetrically. A subcentimeter hypodensity is present in the right kidney which is too small to further characterize. A punctate renal calculus is noted on the left. No hydroureteronephrosis bilaterally. Mild bladder wall thickening is noted on the right lateral wall. Stomach/Bowel: Stomach is within normal limits. Appendix is not seen. No evidence of bowel wall thickening, distention, or inflammatory changes. No free air or pneumatosis. Vascular/Lymphatic: Aortic atherosclerosis. No enlarged abdominal or pelvic lymph nodes. Reproductive: Uterus and bilateral adnexa are unremarkable. Other: No abdominopelvic ascites. Mastectomy changes are noted on the right with multiple foci of air in the subcutaneous soft tissues. No focal fluid collection is seen to suggest abscess. Musculoskeletal: Degenerative changes are present in the thoracolumbar spine. No acute osseous abnormality. IMPRESSION: 1. 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 No abscess is seen. 2. Mild bladder wall thickening, possible infectious or inflammatory cystitis. 3. Hepatic steatosis. 4. Cholelithiasis. 5. Nonobstructive left renal calculus. 6. Aortic atherosclerosis. Electronically Signed   By: Brett Fairy M.D.   On: 11/22/2022 02:46    DG Chest Port 1 View  Result Date: 11/22/2022 CLINICAL DATA:  [id number] Sepsis ([place]) HT:[id number]. Breast cancer, recent chemotherapy. Nausea, vomiting, weakness EXAM: PORTABLE CHEST 1 VIEW COMPARISON:  10/14/2022 FINDINGS: Left Port-A-Cath remains in place, unchanged. Heart and mediastinal contours are within normal limits. No focal opacities or effusions. No acute bony abnormality. IMPRESSION: No active cardiopulmonary disease. Electronically Signed   By: Rolm Baptise M.D.   On: 11/22/2022 01:08    Anti-infectives: Anti-infectives (From admission, onward)    Start     Dose/Rate Route Frequency Ordered Stop   11/22/22 2200  vancomycin (VANCOCIN) IVPB 1000 mg/200 mL premix        1,000 mg 200 mL/hr over 60 Minutes Intravenous Every 24 hours 11/22/22 0749     11/22/22 1300  valACYclovir (VALTREX) tablet 500 mg        500 mg Oral 2 times daily 11/22/22 0728     11/22/22 1000  ceFEPIme (MAXIPIME) 2 g in sodium chloride 0.9 % 100 mL IVPB        2 g 200 mL/hr over 30 Minutes Intravenous Every 12 hours 11/22/22 0722     11/22/22 0245  vancomycin (VANCOREADY) IVPB 1500 mg/300 mL        1,500 mg 150 mL/hr over 120 Minutes Intravenous  Once 11/22/22 0233 11/22/22 0616   11/22/22 0245  ceFEPIme (MAXIPIME) 2 g in sodium chloride 0.9 % 100 mL IVPB        2 g 200 mL/hr over 30 Minutes Intravenous  Once 11/22/22 0233 11/22/22 0336   11/22/22 0045  cefTRIAXone (ROCEPHIN) 1 g in sodium chloride 0.9 % 100 mL IVPB        1 g 200 mL/hr over 30 Minutes Intravenous  Once 11/22/22 0034 11/22/22 0141        Assessment/Plan Breast cancer   S/P right mastectomy 10/14/22 Dr. Barry Dienes Wound infection with neutropenic fever  - R axillary wound with mostly healthy granulation tissue present, minimal fibrin in wound base, opening is about 5 cm x 3 cm x 1 cm depth - wound tunnels superolaterally about 3.5 cm and inferolaterally 5 cm, some erythema surrounding without fluctuance - recommend BID wet to dry dressings  making sure to pack into inferior space as well  - patient was scheduled to see Dr. Barry Dienes yesterday, will reschedule for wound recheck - rule out any other sources of infection but seems reasonable to send on 5-7 days PO abx when otherwise ready for discharge - general surgery will not follow acutely but are available as needed   FEN: CM diet, IVF @75  cc/h VTE: LMWH ID: cefepime/vanc/valcyclovir  - below per TRH -  Asthma HTN CAD with hx of NSTEMI s/p stent to proximal and mid LAD T2DM GERD Macular degeneration HLD OAB with urinary incontinence OSA Peripheral neuropathy  Depression   LOS: 1 day    Norm Parcel, [facility] Surgery 11/23/2022, 10:42 AM Please see Amion for pager number during day hours 7:00am-4:30pm

## 2022-11-23 NOTE — Progress Notes (Signed)
Progress Note    Karina Foster   [id number]  DOB: [birth year]  DOA: 11/21/2022     1 PCP: [name], MD  Initial CC: fever, malaise  Hospital Course: Ms. 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  She was recently seen and follow-up on 11/19/2022.  She was started on Magic mouthwash due to some mouth sores that developed.  She has also had ongoing packing and dressing changes from a dehiscence and was seen outpt by surgery with recommendations to continue packing.  She presented to the hospital this admission due to significant malaise and development of fever. She was found to have temperature 102.8 on workup in the ER with tachycardia and severe neutropenia.  She was started on empiric antibiotics with vancomycin and cefepime.  Interval History:  Patient resting in bed comfortably when seen in no distress.  Endorses very mild discomfort when urinating recently.  Denied any cough, significant diarrhea although did endorse some loose stools recently. Right breast remains tender but has been continuing wound packing outpatient.   Assessment and Plan: * Neutropenic fever ([place]) - patient presented with fever, 102.8 and severe neutropenia.  First chemo cycle was on 11/12/2022.  Sources include possible right breast cellulitis and/or possible UTI given some mild discomfort with urination (UA negative for evidence of infection but she did note improvement in that symptom after starting abx on admission) - CXR clear; CT abdomen/pelvis shows mild bladder wall thickening and s/p mastectomy changes on the right with small focus of air in the subcutaneous tissues, no abscess -Continue on vancomycin and cefepime for now.  MRSA swab positive.  Once de-escalated to oral regimen, will need to cover for MRSA given mastectomy site still being packed and healing  Severe neutropenia ([place]) - ANC about 190 on  11/23/22 - s/p cyclophosphamide (nadir ~ 2 weeks), fluorouracil (nadir 9-14 days), methotrexate on 11/12/22.  - discussed with Dr. Lindi Adie. Give granix until ANC>1000 - CBC w/ diff daily - continue neutropenic precautions   UTI (urinary tract infection) - UA negative but questionable symptoms - continue empiric coverage for now  Malignant neoplasm of upper-outer quadrant of right breast in female, estrogen receptor positive ([place]) - s/p right mastectomy 10/14/22; follows with surgery and oncology outpatient - Appreciate general surgery evaluation during hospitalization due to concern for possible wound dehiscence -Continue outpatient follow-up with oncology - Continue treatment for right breast cellulitis and cover for MRSA with likely Doxy on discharge  Hypokalemia - Replete and recheck as needed  Essential hypertension - Continue Lopressor - Ramipril on hold  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[place]) - Continue SSI and CBG monitoring   Old records reviewed in assessment of this patient  Antimicrobials: Cefepime 11/22/2022 >> current Vancomycin 11/22/2022 >> current  DVT prophylaxis:  enoxaparin (LOVENOX) injection 40 mg Start: 11/22/22 1000 SCDs Start: 11/22/22 0727   Code Status:   Code Status: Full Code  Mobility Assessment (last 72 hours)     Mobility Assessment     Row Name 11/23/22 0915 11/22/22 2000 11/22/22 0930       Does patient have an order for bedrest or is patient medically unstable No - Continue assessment No - Continue assessment No - Continue assessment     What is the highest level of mobility based on the progressive mobility assessment? Level 3 (Stands with assist) - Balance while standing  and cannot march in place Level 3 (Stands with assist) - Balance while standing  and cannot march in place Level 3 (Stands with assist) - Balance while standing  and cannot march in place     Is the above level different from baseline  mobility prior to current illness? Yes - Recommend PT order Yes - Recommend PT order Yes - Recommend PT order              Barriers to discharge:  Disposition Plan:  Home 2-3 days Status is: Inpt  Objective: Blood pressure 109/60, pulse 90, temperature (!) 101.1 F (38.4 C), temperature source Oral, resp. rate 18, height 5\' 7"  (1.702 m), weight 70.3 kg, SpO2 97 %.  Examination:  Physical Exam Constitutional:      General: She is not in acute distress.    Comments: Fatigued appearing  HENT:     Head: Normocephalic and atraumatic.     Mouth/Throat:     Mouth: Mucous membranes are moist.  Eyes:     Extraocular Movements: Extraocular movements intact.  Cardiovascular:     Rate and Rhythm: Normal rate and regular rhythm.  Pulmonary:     Effort: Pulmonary effort is normal. No respiratory distress.     Breath sounds: Normal breath sounds. No wheezing or rhonchi.  Chest:     Comments: R breast exam deferred but pic reviewed from surgery consult Abdominal:     General: Bowel sounds are normal. There is no distension.     Palpations: Abdomen is soft.     Tenderness: There is no abdominal tenderness.  Musculoskeletal:        General: Normal range of motion.     Cervical back: Normal range of motion and neck supple.  Skin:    General: Skin is warm and dry.  Neurological:     General: No focal deficit present.     Mental Status: She is alert.  Psychiatric:        Mood and Affect: Mood normal.        Behavior: Behavior normal.      Consultants:  General surgery  Procedures:    Data Reviewed: Results for orders placed or performed during the hospital encounter of 11/21/22 (from the past 24 hour(s))  Glucose, capillary     Status: Abnormal   Collection Time: 11/22/22  4:29 PM  Result Value Ref Range   Glucose-Capillary 203 (H) 70 - 99 mg/dL  MRSA Next Gen by PCR, Nasal     Status: Abnormal   Collection Time: 11/22/22  5:52 PM   Specimen: Nasal Mucosa; Nasal Swab   Result Value Ref Range   MRSA by PCR Next Gen DETECTED (A) NOT DETECTED  Glucose, capillary     Status: Abnormal   Collection Time: 11/22/22  9:34 PM  Result Value Ref Range   Glucose-Capillary 122 (H) 70 - 99 mg/dL  Comprehensive metabolic panel     Status: Abnormal   Collection Time: 11/23/22  7:01 AM  Result Value Ref Range   Sodium 131 (L) 135 - 145 mmol/L   Potassium 3.1 (L) 3.5 - 5.1 mmol/L   Chloride 103 98 - 111 mmol/L   CO2 21 (L) 22 - 32 mmol/L   Glucose, Bld 121 (H) 70 - 99 mg/dL   BUN 19 8 - 23 mg/dL   Creatinine, Ser 0.78 0.44 - 1.00 mg/dL   Calcium 7.6 (L) 8.9 - 10.3 mg/dL   Total Protein 6.4 (L) 6.5 - 8.1 g/dL  Albumin 2.9 (L) 3.5 - 5.0 g/dL   AST 14 (L) 15 - 41 U/L   ALT 15 0 - 44 U/L   Alkaline Phosphatase 31 (L) 38 - 126 U/L   Total Bilirubin 0.5 0.3 - 1.2 mg/dL   GFR, Estimated >60 >60 mL/min   Anion gap 7 5 - 15  CBC     Status: Abnormal   Collection Time: 11/23/22  7:01 AM  Result Value Ref Range   WBC 0.5 (LL) 4.0 - 10.5 K/uL   RBC 3.19 (L) 3.87 - 5.11 MIL/uL   Hemoglobin 10.1 (L) 12.0 - 15.0 g/dL   HCT 30.9 (L) 36.0 - 46.0 %   MCV 96.9 80.0 - 100.0 fL   MCH 31.7 26.0 - 34.0 pg   MCHC 32.7 30.0 - 36.0 g/dL   RDW 14.0 11.5 - 15.5 %   Platelets 135 (L) 150 - 400 K/uL   nRBC 0.0 0.0 - 0.2 %  Magnesium     Status: None   Collection Time: 11/23/22  7:01 AM  Result Value Ref Range   Magnesium 2.1 1.7 - 2.4 mg/dL  Differential     Status: Abnormal   Collection Time: 11/23/22  7:01 AM  Result Value Ref Range   Neutrophils Relative % 38 %   Neutro Abs 0.2 (LL) 1.7 - 7.7 K/uL   Lymphocytes Relative 31 %   Lymphs Abs 0.2 (L) 0.7 - 4.0 K/uL   Monocytes Relative 8 %   Monocytes Absolute 0.0 (L) 0.1 - 1.0 K/uL   Eosinophils Relative 23 %   Eosinophils Absolute 0.1 0.0 - 0.5 K/uL   Basophils Relative 0 %   Basophils Absolute 0.0 0.0 - 0.1 K/uL   Abs Immature Granulocytes 0.00 0.00 - 0.07 K/uL   Reactive, Benign Lymphocytes PRESENT   Glucose,  capillary     Status: Abnormal   Collection Time: 11/23/22  7:17 AM  Result Value Ref Range   Glucose-Capillary 113 (H) 70 - 99 mg/dL  Glucose, capillary     Status: Abnormal   Collection Time: 11/23/22 12:01 PM  Result Value Ref Range   Glucose-Capillary 155 (H) 70 - 99 mg/dL    I have reviewed pertinent nursing notes, vitals, labs, and images as necessary. I have ordered labwork to follow up on as indicated.  I have reviewed the last notes from staff over past 24 hours. I have discussed patient's care plan and test results with nursing staff, CM/SW, and other staff as appropriate.  Time spent: Greater than 50% of the 55 minute visit was spent in counseling/coordination of care for the patient as laid out in the A&P.   LOS: 1 day   Dwyane Dee, MD [facility] 11/23/2022, 1:05 PM

## 2022-11-23 NOTE — Assessment & Plan Note (Signed)
-  Replete and recheck as needed 

## 2022-11-23 NOTE — Discharge Instructions (Signed)
WOUND CARE: - dressing to be changed twice daily - supplies: sterile saline, kerlix/guaze, scissors, ABD pads, tape  - remove dressing and all packing carefully, moistening with sterile saline as needed to avoid packing/internal dressing sticking to the wound. - clean edges of skin around the wound with water/gauze, making sure there is no tape debris or leakage left on skin that could cause skin irritation or breakdown. - dampen clean kerlix/gauze with sterile saline and pack wound from wound base to skin level, making sure to take note of any possible areas of wound tracking, tunneling and packing appropriately. Wound can be packed loosely. Trim kerlix/gauze to size if a whole roll/piece is not required. - cover wound with a dry ABD pad and secure with tape.  - write the date/time on the dry dressing/tape to better track when the last dressing change occurred. - apply any skin protectant/powder recommended by clinician to protect skin/skin folds. - change dressing as needed if leakage occurs, wound gets contaminated, or patient requests to shower. - patient may shower daily with wound open and following the shower the wound should be dried and a clean dressing placed.   

## 2022-11-23 NOTE — Assessment & Plan Note (Signed)
-   Continue Lopressor - Ramipril on hold

## 2022-11-23 NOTE — Hospital Course (Signed)
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  She was recently seen and follow-up on 11/19/2022.  She was started on Magic mouthwash due to some mouth sores that developed.  She has also had ongoing packing and dressing changes from a dehiscence and was seen outpt by surgery with recommendations to continue packing.  She presented to the hospital this admission due to significant malaise and development of fever. She was found to have temperature 102.8 on workup in the ER with tachycardia and severe neutropenia.  She was started on empiric antibiotics with vancomycin and cefepime.

## 2022-11-23 NOTE — Assessment & Plan Note (Addendum)
-   patient presented with fever, 102.8 and severe neutropenia.  First chemo cycle was on 11/12/2022.  Sources include possible right breast cellulitis and/or possible UTI given some mild discomfort with urination (UA negative for evidence of infection but she did note improvement in that symptom after starting abx on admission) - CXR clear; CT abdomen/pelvis shows mild bladder wall thickening and s/p mastectomy changes on the right with small focus of air in the subcutaneous tissues, no abscess -Continue on vancomycin and cefepime for now.  MRSA swab positive.  Once de-escalated to oral regimen, will need to cover for MRSA given mastectomy site still being packed and healing

## 2022-11-24 DIAGNOSIS — D709 Neutropenia, unspecified: Secondary | ICD-10-CM | POA: Diagnosis not present

## 2022-11-24 DIAGNOSIS — R5081 Fever presenting with conditions classified elsewhere: Secondary | ICD-10-CM | POA: Diagnosis not present

## 2022-11-24 LAB — BASIC METABOLIC PANEL
Anion gap: 10 (ref 5–15)
BUN: 14 mg/dL (ref 8–23)
CO2: 22 mmol/L (ref 22–32)
Calcium: 8.1 mg/dL — ABNORMAL LOW (ref 8.9–10.3)
Chloride: 102 mmol/L (ref 98–111)
Creatinine, Ser: 0.76 mg/dL (ref 0.44–1.00)
GFR, Estimated: >60 mL/min (ref >60)
Glucose, Bld: 144 mg/dL — ABNORMAL HIGH (ref 70–99)
Potassium: 2.8 mmol/L — ABNORMAL LOW (ref 3.5–5.1)
Sodium: 134 mmol/L — ABNORMAL LOW (ref 135–145)

## 2022-11-24 LAB — CBC WITH DIFFERENTIAL/PLATELET
Abs Immature Granulocytes: 0 10*3/uL (ref 0.00–0.07)
Basophils Absolute: 0 10*3/uL (ref 0.0–0.1)
Basophils Relative: 1 %
Eosinophils Absolute: 0.2 10*3/uL (ref 0.0–0.5)
Eosinophils Relative: 16 %
HCT: 28.6 % — ABNORMAL LOW (ref 36.0–46.0)
Hemoglobin: 9.7 g/dL — ABNORMAL LOW (ref 12.0–15.0)
Immature Granulocytes: 0 %
Lymphocytes Relative: 32 %
Lymphs Abs: 0.3 10*3/uL — ABNORMAL LOW (ref 0.7–4.0)
MCH: 31.9 pg (ref 26.0–34.0)
MCHC: 33.9 g/dL (ref 30.0–36.0)
MCV: 94.1 fL (ref 80.0–100.0)
Monocytes Absolute: 0.3 10*3/uL (ref 0.1–1.0)
Monocytes Relative: 33 %
Neutro Abs: 0.2 10*3/uL — CL (ref 1.7–7.7)
Neutrophils Relative %: 18 %
Platelets: 168 10*3/uL (ref 150–400)
RBC: 3.04 MIL/uL — ABNORMAL LOW (ref 3.87–5.11)
RDW: 13.8 % (ref 11.5–15.5)
WBC: 1 10*3/uL — CL (ref 4.0–10.5)
nRBC: 0 % (ref 0.0–0.2)

## 2022-11-24 LAB — GLUCOSE, CAPILLARY
Glucose-Capillary: 147 mg/dL — ABNORMAL HIGH (ref 70–99)
Glucose-Capillary: 189 mg/dL — ABNORMAL HIGH (ref 70–99)
Glucose-Capillary: 136 mg/dL — ABNORMAL HIGH (ref 70–99)
Glucose-Capillary: 187 mg/dL — ABNORMAL HIGH (ref 70–99)

## 2022-11-24 LAB — MAGNESIUM: Magnesium: 2.1 mg/dL (ref 1.7–2.4)

## 2022-11-24 MED ORDER — VANCOMYCIN HCL 1250 MG/250ML IV SOLN
1250.0000 mg | INTRAVENOUS | Status: DC
  Administered 2022-11-24 – 2022-11-25 (×2): 1250 mg via INTRAVENOUS
  Filled 2022-11-24 (×2): qty 250

## 2022-11-24 MED ORDER — PANTOPRAZOLE SODIUM 40 MG PO TBEC
40.0000 mg | DELAYED_RELEASE_TABLET | Freq: Every day | ORAL | Status: DC
  Administered 2022-11-24 – 2022-11-26 (×3): 40 mg via ORAL
  Filled 2022-11-24 (×3): qty 1

## 2022-11-24 MED ORDER — POTASSIUM CHLORIDE CRYS ER 20 MEQ PO TBCR
40.0000 meq | EXTENDED_RELEASE_TABLET | ORAL | Status: AC
  Administered 2022-11-24 (×3): 40 meq via ORAL
  Filled 2022-11-24 (×3): qty 2

## 2022-11-24 MED ORDER — IBUPROFEN 200 MG PO TABS
600.0000 mg | ORAL_TABLET | Freq: Three times a day (TID) | ORAL | Status: DC | PRN
  Administered 2022-11-24 – 2022-11-26 (×5): 600 mg via ORAL
  Filled 2022-11-24 (×5): qty 3

## 2022-11-24 NOTE — Plan of Care (Signed)
  Problem: Health Behavior/Discharge Planning: Goal: Ability to manage health-related needs will improve Outcome: Progressing   Problem: Clinical Measurements: Goal: Ability to maintain clinical measurements within normal limits will improve Outcome: Progressing   Problem: Clinical Measurements: Goal: Will remain free from infection Outcome: Progressing   

## 2022-11-24 NOTE — Progress Notes (Signed)
PROGRESS NOTE    Karina Foster  [id number] DOB: August 03, 1941 DOA: 11/21/2022 PCP: [name], MD  Chief Complaint  Patient presents with   Weakness   Emesis    Brief Narrative:   Ms. 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  She was recently seen and follow-up on 11/19/2022.  She was started on Magic mouthwash due to some mouth sores that developed.  She has also had ongoing packing and dressing changes from Jakylan Ron dehiscence and was seen outpt by surgery with recommendations to continue packing.  She presented to the hospital this admission due to significant malaise and development of fever. She was found to have temperature 102.8 on workup in the ER with tachycardia and severe neutropenia.  She was started on empiric antibiotics with vancomycin and cefepime.  Assessment & Plan:   Principal Problem:   Neutropenic fever ([place]) Active Problems:   Severe neutropenia (HCC)   Malignant neoplasm of upper-outer quadrant of right breast in female, estrogen receptor positive (HCC)   UTI (urinary tract infection)   Hypokalemia   BPPV (benign paroxysmal positional vertigo)   Hyperlipemia   [place] Biedermann-Cath in place   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[place])   Essential hypertension  Neutropenic fever ([place]) - patient presented with fever, 102.8 and severe neutropenia.  First chemo cycle was on 11/12/2022.  Sources include possible right breast cellulitis and/or possible UTI given some mild discomfort with urination (UA negative for evidence of infection but she did note improvement in that symptom after starting abx on admission) - last fever 3/19  - UA with negative LE, nitrite  - CXR clear; CT abdomen/pelvis shows mild bladder wall thickening and s/p mastectomy changes on the right with small focus of air in the subcutaneous tissues, no abscess -Continue on  vancomycin and cefepime for now.  MRSA swab positive.  Once de-escalated to oral regimen, will need to cover for MRSA given mastectomy site still being packed and healing   Severe neutropenia (HCC) - ANC 200 on 11/24/22 - s/p cyclophosphamide (nadir ~ 2 weeks), fluorouracil (nadir 9-14 days), methotrexate on 11/12/22.  - case was discussed with Dr. Lindi Adie. Give granix until ANC>1000 - c/o mouth pain, suspect gingivostomatitis -> magic mouth was - CBC w/ diff daily - continue neutropenic precautions    UTI (urinary tract infection) - UA negative but questionable symptoms - continue empiric coverage for now   Malignant neoplasm of upper-outer quadrant of right breast in female, estrogen receptor positive ([place]) - s/p right mastectomy 10/14/22; follows with surgery and oncology outpatient - general surgery recommending BID wet to dry dressings -Continue outpatient follow-up with oncology - Continue treatment for right breast cellulitis and cover for MRSA at discharge   Hypokalemia - replace and follow - mag wnl   Essential hypertension - Continue Lopressor - Ramipril on hold   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[place]) - Continue SSI and CBG monitoring      DVT prophylaxis: lovenox Code Status: full Family Communication: none Disposition:   Status is: Inpatient Remains inpatient appropriate because: further improvement, continued neutropenia   Consultants:  oncology  Procedures:  none  Antimicrobials:  Anti-infectives (From admission, onward)    Start     Dose/Rate Route Frequency Ordered Stop   11/24/22 2200  vancomycin (VANCOREADY) IVPB 1250 mg/250 mL        1,250  mg 166.7 mL/hr over 90 Minutes Intravenous Every 24 hours 11/24/22 1050     11/22/22 2200  vancomycin (VANCOCIN) IVPB 1000 mg/200 mL premix  Status:  Discontinued        1,000 mg 200 mL/hr over 60 Minutes Intravenous Every 24 hours 11/22/22 0749 11/24/22 1050   11/22/22  1300  valACYclovir (VALTREX) tablet 500 mg        500 mg Oral 2 times daily 11/22/22 0728     11/22/22 1000  ceFEPIme (MAXIPIME) 2 g in sodium chloride 0.9 % 100 mL IVPB        2 g 200 mL/hr over 30 Minutes Intravenous Every 12 hours 11/22/22 0722     11/22/22 0245  vancomycin (VANCOREADY) IVPB 1500 mg/300 mL        1,500 mg 150 mL/hr over 120 Minutes Intravenous  Once 11/22/22 0233 11/22/22 0616   11/22/22 0245  ceFEPIme (MAXIPIME) 2 g in sodium chloride 0.9 % 100 mL IVPB        2 g 200 mL/hr over 30 Minutes Intravenous  Once 11/22/22 0233 11/22/22 0336   11/22/22 0045  cefTRIAXone (ROCEPHIN) 1 g in sodium chloride 0.9 % 100 mL IVPB        1 g 200 mL/hr over 30 Minutes Intravenous  Once 11/22/22 0034 11/22/22 0141       Subjective: Complains of mouth soreness  Objective: Vitals:   11/23/22 1003 11/23/22 1204 11/24/22 0444 11/24/22 1415  BP: 100/64 109/60 (!) 131/59 95/74  Pulse: 92 90 84 81  Resp:  18 18 18   Temp:  (!) 101.1 F (38.4 C) 99.4 F (37.4 C) 99.9 F (37.7 C)  TempSrc:  Oral Oral Oral  SpO2:  97% 97% 98%  Weight:      Height:        Intake/Output Summary (Last 24 hours) at 11/24/2022 1627 Last data filed at 11/24/2022 1423 Gross per 24 hour  Intake 1160 ml  Output 2700 ml  Net -1540 ml   Filed Weights   11/22/22 0009  Weight: 70.3 kg    Examination:  General exam: Appears calm and comfortable  Respiratory system: unlabored Cardiovascular system:RRR Gastrointestinal system: Abdomen is nondistended, soft and nontender. Central nervous system: Alert and oriented. No focal neurological deficits. Skin: mild erythema around R mastectomy dressing Extremities: no LEE   Data Reviewed: I have personally reviewed following labs and imaging studies  CBC: Recent Labs  Lab 11/19/22 1446 11/22/22 0058 11/23/22 0701 11/24/22 0623  WBC 4.5 0.6* 0.5* 1.0*  NEUTROABS 3.0 0.3* 0.2* 0.2*  HGB 11.5* 11.0* 10.1* 9.7*  HCT 32.7* 32.4* 30.9* 28.6*  MCV 94.2  95.6 96.9 94.1  PLT 170 160 135* [id number]    Basic Metabolic Panel: Recent Labs  Lab 11/19/22 1446 11/22/22 0058 11/23/22 0701 11/24/22 0623  NA 135 130* 131* 134*  K 3.6 3.4* 3.1* 2.8*  CL 101 101 103 102  CO2 25 19* 21* 22  GLUCOSE 127* 172* 121* 144*  BUN 29* 27* 19 14  CREATININE 0.94 1.02* 0.78 0.76  CALCIUM 9.2 8.4* 7.6* 8.1*  MG  --   --  2.1 2.1    GFR: Estimated Creatinine Clearance: 55.5 mL/min (by C-G formula based on SCr of 0.76 mg/dL).  Liver Function Tests: Recent Labs  Lab 11/19/22 1446 11/22/22 0058 11/23/22 0701  AST 11* 14* 14*  ALT 17 16 15   ALKPHOS 34* 35* 31*  BILITOT 0.6 1.0 0.5  PROT 7.4 7.5 6.4*  ALBUMIN 4.3  4.0 2.9*    CBG: Recent Labs  Lab 11/23/22 1201 11/23/22 1643 11/23/22 2202 11/24/22 0726 11/24/22 1122  GLUCAP 155* 140* 157* 147* 189*     Recent Results (from the past 240 hour(s))  Culture, blood (Routine x 2)     Status: None (Preliminary result)   Collection Time: 11/22/22 12:58 AM   Specimen: BLOOD  Result Value Ref Range Status   Specimen Description   Final    BLOOD LA Performed at [facility] [address] [place], [place] [address]    Special Requests   Final    BOTTLES DRAWN AEROBIC AND ANAEROBIC Blood Culture results may not be optimal due to an excessive volume of blood received in culture bottles Performed at [facility] [address], [place], [place] [address]    Culture   Final    NO GROWTH 2 DAYS Performed at [facility] [address] [place], [place] [address]    Report Status PENDING  Incomplete  Culture, blood (Routine x 2)     Status: None (Preliminary result)   Collection Time: 11/22/22 12:58 AM   Specimen: BLOOD  Result Value Ref Range Status   Specimen Description   Final    BLOOD BLOOD LEFT HAND Performed at [facility] [address], [place], [place] [address]    Special Requests   Final    BOTTLES DRAWN AEROBIC  AND ANAEROBIC Blood Culture results may not be optimal due to an excessive volume of blood received in culture bottles Performed at [facility] [address] [place], [place] [address]    Culture   Final    NO GROWTH 2 DAYS Performed at [facility] [address], [place], [place] [address]    Report Status PENDING  Incomplete  Resp panel by RT-PCR (RSV, Flu Candra Wegner&B, Covid)     Status: None   Collection Time: 11/22/22  1:20 AM   Specimen: Nasal Swab  Result Value Ref Range Status   SARS Coronavirus 2 by RT PCR NEGATIVE NEGATIVE Final    Comment: (NOTE) SARS-CoV-2 target nucleic acids are NOT DETECTED.  The SARS-CoV-2 RNA is generally detectable in upper respiratory specimens during the acute phase of infection. The lowest concentration of SARS-CoV-2 viral copies this assay can detect is 138 copies/mL. Shakirra Buehler negative result does not preclude SARS-Cov-2 infection and should not be used as the sole basis for treatment or other patient management decisions. Alazia Crocket negative result may occur with  improper specimen collection/handling, submission of specimen other than nasopharyngeal swab, presence of viral mutation(s) within the areas targeted by this assay, and inadequate number of viral copies(<138 copies/mL). Tujuana Kilmartin negative result must be combined with clinical observations, patient history, and epidemiological information. The expected result is Negative.  Fact Sheet for Patients:  EntrepreneurPulse.com.au  Fact Sheet for Healthcare Providers:  IncredibleEmployment.be  This test is no t yet approved or cleared by the [place] FDA and  has been authorized for detection and/or diagnosis of SARS-CoV-2 by FDA under an Emergency Use Authorization (EUA). This EUA will remain  in effect (meaning this test can be used) for the duration of the COVID-19 declaration under Section 564(b)(1) of the Act, 21 U.S.C.section 360bbb-3(b)(1),  unless the authorization is terminated  or revoked sooner.       Influenza Minha Fulco by PCR NEGATIVE NEGATIVE Final   Influenza B by PCR NEGATIVE NEGATIVE Final    Comment: (NOTE) The Xpert  Xpress SARS-CoV-2/FLU/RSV plus assay is intended as an aid in the diagnosis of influenza from Nasopharyngeal swab specimens and should not be used as Zakarie Sturdivant sole basis for treatment. Nasal washings and aspirates are unacceptable for Xpert Xpress SARS-CoV-2/FLU/RSV testing.  Fact Sheet for Patients: EntrepreneurPulse.com.au  Fact Sheet for Healthcare Providers: IncredibleEmployment.be  This test is not yet approved or cleared by the [place] FDA and has been authorized for detection and/or diagnosis of SARS-CoV-2 by FDA under an Emergency Use Authorization (EUA). This EUA will remain in effect (meaning this test can be used) for the duration of the COVID-19 declaration under Section 564(b)(1) of the Act, 21 U.S.C. section 360bbb-3(b)(1), unless the authorization is terminated or revoked.     Resp Syncytial Virus by PCR NEGATIVE NEGATIVE Final    Comment: (NOTE) Fact Sheet for Patients: EntrepreneurPulse.com.au  Fact Sheet for Healthcare Providers: IncredibleEmployment.be  This test is not yet approved or cleared by the [place] FDA and has been authorized for detection and/or diagnosis of SARS-CoV-2 by FDA under an Emergency Use Authorization (EUA). This EUA will remain in effect (meaning this test can be used) for the duration of the COVID-19 declaration under Section 564(b)(1) of the Act, 21 U.S.C. section 360bbb-3(b)(1), unless the authorization is terminated or revoked.  Performed at [facility] [place] [facility] [address], [place], [place] [address]   MRSA Next Gen by PCR, Nasal     Status: Abnormal   Collection Time: 11/22/22  5:52 PM   Specimen: Nasal Mucosa; Nasal Swab  Result Value Ref Range Status    MRSA by PCR Next Gen DETECTED (Keigan Girten) NOT DETECTED Final    Comment: RESULT CALLED TO, READ BACK BY AND VERIFIED WITH: rn ammi l at [place] 11/22/22 [place] Dorothy Polhemus (NOTE) The GeneXpert MRSA Assay (FDA approved for NASAL specimens only), is one component of Nona Gracey comprehensive MRSA colonization surveillance program. It is not intended to diagnose MRSA infection nor to guide or monitor treatment for MRSA infections. Test performance is not FDA approved in patients less than 30 years old. Performed at [facility], [place] [address], [place], [place] [address]          Radiology Studies: No results found.      Scheduled Meds:  aspirin  325 mg Oral Daily   Chlorhexidine Gluconate Cloth  6 each Topical Q0600   citalopram  40 mg Oral Daily   docusate sodium  100 mg Oral BID   enoxaparin (LOVENOX) injection  40 mg Subcutaneous Q24H   ezetimibe  10 mg Oral QODAY   insulin aspart  0-15 Units Subcutaneous TID WC   insulin aspart  0-5 Units Subcutaneous QHS   metoprolol tartrate  12.5 mg Oral BID   mupirocin ointment  1 Application Nasal BID   pantoprazole  40 mg Oral Daily   potassium chloride  40 mEq Oral Q4H   Tbo-filgastrim (GRANIX) SQ  300 mcg Subcutaneous q1800   valACYclovir  500 mg Oral BID   Continuous Infusions:  ceFEPime (MAXIPIME) IV 2 g (11/24/22 1014)   vancomycin       LOS: 2 days    Time spent: over 30 min    Fayrene Helper, MD [facility]   To contact the attending provider between 7A-7P or the covering provider during after hours 7P-7A, please log into the web site [email or web address] and access using universal [place] password for that web site. If you do not have the password, please call the hospital operator.  11/24/2022, 4:27  PM    

## 2022-11-24 NOTE — TOC Progression Note (Signed)
Transition of Care [facility]) - Progression Note    Patient Details  Name: Karina Foster MRN: [id number] Date of Birth: Jul 06, 1941  [facility]) CM/SW Contact  Angelita Ingles, RN Phone Number:[phone]  11/24/2022, 4:13 PM  Clinical Narrative:    Transition of Care [facility]) Screening Note   Patient Details  Name: Karina Foster Date of Birth: 06/07/1941   Transition of Care [facility]) CM/SW Contact:    Angelita Ingles, RN Phone Number: 11/24/2022, 4:13 PM    Transition of Care Department [facility]) has reviewed patient and no TOC needs have been identified at this time. We will continue to monitor patient advancement through interdisciplinary progression rounds. If new patient transition needs arise, please place a TOC consult.          Expected Discharge Plan and Services                                               Social Determinants of Health (SDOH) Interventions SDOH Screenings   Food Insecurity: No Food Insecurity (11/22/2022)  Housing: Low Risk  (11/22/2022)  Transportation Needs: No Transportation Needs (11/22/2022)  Utilities: Not At Risk (11/22/2022)  Tobacco Use: Medium Risk (11/22/2022)    Readmission Risk Interventions     No data to display

## 2022-11-24 NOTE — Progress Notes (Signed)
Pharmacy Antibiotic Note  Karina Foster is a 82 y.o. female admitted on 11/21/2022 with febrile neutropenia.  Pharmacy has been consulted for vancomycin dosing.  Today, 11/24/22 -Renal function has improved, Scr = 0.76 mg/dl -on Vancomycin 1 gm IV every 24 hrs.  Plan: Increase Vancomycin dose to 1.25 gm IV every 24 hrs.  ( AUC 490, round SCr 0.8 mg/dl, Crcl 56 ml/min)  Monitor clinical course, renal function, cultures as available   Height: 5\' 7"  (170.2 cm) Weight: 70.3 kg (155 lb) IBW/kg (Calculated) : 61.6  Temp (24hrs), Avg:100.3 F (37.9 C), Min:99.4 F (37.4 C), Max:101.1 F (38.4 C)  Recent Labs  Lab 11/19/22 1446 11/22/22 0058 11/23/22 0701 11/24/22 0623  WBC 4.5 0.6* 0.5* 1.0*  CREATININE 0.94 1.02* 0.78 0.76  LATICACIDVEN  --  1.0  --   --      Estimated Creatinine Clearance: 55.5 mL/min (by C-G formula based on SCr of 0.76 mg/dL).    Allergies  Allergen Reactions   Azithromycin Rash   Demerol [Meperidine] Diarrhea and Nausea And Vomiting    Antimicrobials this admission: 3/18 ceftriaxone x 1  3/18 vancomycin >>  3/18 cefepime >>   Microbiology results: 3/18 BCx: ngtd 3/18 MRSA PCR: positive  Thank you for allowing pharmacy to be a part of this patient's care.  Mendel Ryder, PharmD Clinical Pharmacist 11/24/2022 10:58 AM

## 2022-11-25 DIAGNOSIS — D709 Neutropenia, unspecified: Secondary | ICD-10-CM | POA: Diagnosis not present

## 2022-11-25 DIAGNOSIS — R5081 Fever presenting with conditions classified elsewhere: Secondary | ICD-10-CM | POA: Diagnosis not present

## 2022-11-25 LAB — GLUCOSE, CAPILLARY
Glucose-Capillary: 153 mg/dL — ABNORMAL HIGH (ref 70–99)
Glucose-Capillary: 164 mg/dL — ABNORMAL HIGH (ref 70–99)
Glucose-Capillary: 108 mg/dL — ABNORMAL HIGH (ref 70–99)
Glucose-Capillary: 149 mg/dL — ABNORMAL HIGH (ref 70–99)

## 2022-11-25 LAB — CBC WITH DIFFERENTIAL/PLATELET
Abs Immature Granulocytes: 0.53 10*3/uL — ABNORMAL HIGH (ref 0.00–0.07)
Basophils Absolute: 0 10*3/uL (ref 0.0–0.1)
Basophils Relative: 1 %
Eosinophils Absolute: 0.3 10*3/uL (ref 0.0–0.5)
Eosinophils Relative: 7 %
HCT: 34.7 % — ABNORMAL LOW (ref 36.0–46.0)
Hemoglobin: 11.3 g/dL — ABNORMAL LOW (ref 12.0–15.0)
Immature Granulocytes: 11 %
Lymphocytes Relative: 16 %
Lymphs Abs: 0.8 10*3/uL (ref 0.7–4.0)
MCH: 31.6 pg (ref 26.0–34.0)
MCHC: 32.6 g/dL (ref 30.0–36.0)
MCV: 96.9 fL (ref 80.0–100.0)
Monocytes Absolute: 0.7 10*3/uL (ref 0.1–1.0)
Monocytes Relative: 14 %
Neutro Abs: 2.6 10*3/uL (ref 1.7–7.7)
Neutrophils Relative %: 51 %
Platelets: 223 10*3/uL (ref 150–400)
RBC: 3.58 MIL/uL — ABNORMAL LOW (ref 3.87–5.11)
RDW: 14.1 % (ref 11.5–15.5)
WBC: 4.9 10*3/uL (ref 4.0–10.5)
nRBC: 1 % — ABNORMAL HIGH (ref 0.0–0.2)

## 2022-11-25 LAB — BASIC METABOLIC PANEL
Anion gap: 9 (ref 5–15)
BUN: 16 mg/dL (ref 8–23)
CO2: 22 mmol/L (ref 22–32)
Calcium: 8.8 mg/dL — ABNORMAL LOW (ref 8.9–10.3)
Chloride: 106 mmol/L (ref 98–111)
Creatinine, Ser: 0.71 mg/dL (ref 0.44–1.00)
GFR, Estimated: >60 mL/min (ref >60)
Glucose, Bld: 162 mg/dL — ABNORMAL HIGH (ref 70–99)
Potassium: 4.6 mmol/L (ref 3.5–5.1)
Sodium: 137 mmol/L (ref 135–145)

## 2022-11-25 LAB — MAGNESIUM: Magnesium: 2.2 mg/dL (ref 1.7–2.4)

## 2022-11-25 MED ORDER — SODIUM BICARBONATE/SODIUM CHLORIDE MOUTHWASH
OROMUCOSAL | Status: DC | PRN
  Filled 2022-11-25: qty 1000

## 2022-11-25 MED ORDER — SODIUM CHLORIDE 0.9 % IV SOLN
2.0000 g | INTRAVENOUS | Status: DC
  Administered 2022-11-25 – 2022-11-26 (×2): 2 g via INTRAVENOUS
  Filled 2022-11-25 (×2): qty 20

## 2022-11-25 MED ORDER — DIPHENHYDRAMINE HCL 25 MG PO CAPS
25.0000 mg | ORAL_CAPSULE | Freq: Once | ORAL | Status: AC
  Administered 2022-11-25: 25 mg via ORAL
  Filled 2022-11-25: qty 1

## 2022-11-25 NOTE — Care Management Important Message (Signed)
Important Message  Patient Details IM Letter given. Name: Karina Foster MRN: [id number] Date of Birth: 1940/12/01   Medicare Important Message Given:  Yes     Kerin Salen 11/25/2022, 10:10 AM

## 2022-11-25 NOTE — Progress Notes (Signed)
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  She was recently seen and follow-up on 11/19/2022.  She was started on Magic mouthwash due to some mouth sores that developed.  She has also had ongoing packing and dressing changes from Karina Foster dehiscence and was seen outpt by surgery with recommendations to continue packing.  She presented to the hospital this admission due to significant malaise and development of fever. She was found to have temperature 102.8 on workup in the ER with tachycardia and severe neutropenia.  She was started on empiric antibiotics with vancomycin and cefepime.  Assessment & Plan:   Principal Problem:   Neutropenic fever ([place]) Active Problems:   Severe neutropenia (HCC)   Malignant neoplasm of upper-outer quadrant of right breast in female, estrogen receptor positive (HCC)   UTI (urinary tract infection)   Hypokalemia   BPPV (benign paroxysmal positional vertigo)   Hyperlipemia   Port-[place] Peet-Cath in place   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[place])   Essential hypertension  Neutropenic fever ([place]) - patient presented with fever, 102.8 and severe neutropenia.  First chemo cycle was on 11/12/2022.  Sources include possible right breast cellulitis and/or possible UTI given some mild discomfort with urination (UA negative for evidence of infection but she did note improvement in that symptom after starting abx on admission) - last fever 3/19  - UA with negative LE, nitrite  - CXR clear; CT abdomen/pelvis shows mild bladder wall thickening and s/p mastectomy changes on the right with small focus of air in the subcutaneous tissues, no abscess -Continue on  vancomycin and ceftriaxone for now.  MRSA swab positive.  Once de-escalated to oral regimen, will need to cover for MRSA given mastectomy site still being packed and healing   Severe neutropenia (HCC) - ANC 200 on 11/24/22 - s/p cyclophosphamide (nadir ~ 2 weeks), fluorouracil (nadir 9-14 days), methotrexate on 11/12/22.  - case was discussed with Dr. Lindi Adie. ANC improved, hold further granix. - c/o mouth pain, suspect gingivostomatitis -> magic mouth wash, bicarb mouthwash - CBC w/ diff daily - continue neutropenic precautions    UTI (urinary tract infection) - UA negative but questionable symptoms - continue empiric coverage for now   Malignant neoplasm of upper-outer quadrant of right breast in female, estrogen receptor positive ([place]) - s/p right mastectomy 10/14/22; follows with surgery and oncology outpatient - general surgery recommending BID wet to dry dressings -Continue outpatient follow-up with oncology - Continue treatment for right breast cellulitis and cover for MRSA at discharge   Hypokalemia resolved   Essential hypertension - Continue Lopressor - Ramipril on hold   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[place]) - Continue SSI and CBG monitoring      DVT prophylaxis: lovenox Code Status: full Family Communication: none Disposition:   Status is: Inpatient Remains inpatient appropriate because: further improvement, continued neutropenia   Consultants:  oncology  Procedures:  none  Antimicrobials:  Anti-infectives (From admission, onward)    Start     Dose/Rate Route Frequency Ordered Stop   11/25/22 1000  cefTRIAXone (ROCEPHIN) 2 g in sodium chloride 0.9 % 100 mL IVPB  2 g 200 mL/hr over 30 Minutes Intravenous Every 24 hours 11/25/22 0804     11/24/22 2200  vancomycin (VANCOREADY) IVPB 1250 mg/250 mL        1,250 mg 166.7 mL/hr over 90 Minutes Intravenous Every 24 hours 11/24/22 1050     11/22/22 2200  vancomycin  (VANCOCIN) IVPB 1000 mg/200 mL premix  Status:  Discontinued        1,000 mg 200 mL/hr over 60 Minutes Intravenous Every 24 hours 11/22/22 0749 11/24/22 1050   11/22/22 1300  valACYclovir (VALTREX) tablet 500 mg        500 mg Oral 2 times daily 11/22/22 0728     11/22/22 1000  ceFEPIme (MAXIPIME) 2 g in sodium chloride 0.9 % 100 mL IVPB  Status:  Discontinued        2 g 200 mL/hr over 30 Minutes Intravenous Every 12 hours 11/22/22 0722 11/25/22 0804   11/22/22 0245  vancomycin (VANCOREADY) IVPB 1500 mg/300 mL        1,500 mg 150 mL/hr over 120 Minutes Intravenous  Once 11/22/22 0233 11/22/22 0616   11/22/22 0245  ceFEPIme (MAXIPIME) 2 g in sodium chloride 0.9 % 100 mL IVPB        2 g 200 mL/hr over 30 Minutes Intravenous  Once 11/22/22 0233 11/22/22 0336   11/22/22 0045  cefTRIAXone (ROCEPHIN) 1 g in sodium chloride 0.9 % 100 mL IVPB        1 g 200 mL/hr over 30 Minutes Intravenous  Once 11/22/22 0034 11/22/22 0141       Subjective: No new complaints Some issues taking PO   Objective: Vitals:   11/24/22 1415 11/24/22 2031 11/25/22 0411 11/25/22 1421  BP: 95/74 123/60 129/61 100/64  Pulse: 81 79 73 85  Resp: 18 18 18 16   Temp: 99.9 F (37.7 C) 97.9 F (36.6 C) 97.8 F (36.6 C) 99.8 F (37.7 C)  TempSrc: Oral Oral Oral Oral  SpO2: 98% 95% 98% 94%  Weight:      Height:        Intake/Output Summary (Last 24 hours) at 11/25/2022 1637 Last data filed at 11/25/2022 1300 Gross per 24 hour  Intake 585 ml  Output 900 ml  Net -315 ml   Filed Weights   11/22/22 0009  Weight: 70.3 kg    Examination:  General: No acute distress. Cardiovascular: RRR Lungs: unlabored Abdomen: Soft, nontender, nondistended  Neurological: Alert and oriented 3. Moves all extremities 4 with equal strength. Cranial nerves II through XII grossly intact. Skin: mild erythema around mastectomy dressing Extremities: No clubbing or cyanosis. No edema.   Data Reviewed: I have personally reviewed  following labs and imaging studies  CBC: Recent Labs  Lab 11/19/22 1446 11/22/22 0058 11/23/22 0701 11/24/22 0623 11/25/22 0540  WBC 4.5 0.6* 0.5* 1.0* 4.9  NEUTROABS 3.0 0.3* 0.2* 0.2* 2.6  HGB 11.5* 11.0* 10.1* 9.7* 11.3*  HCT 32.7* 32.4* 30.9* 28.6* 34.7*  MCV 94.2 95.6 96.9 94.1 96.9  PLT 170 160 135* 168 [id number]    Basic Metabolic Panel: Recent Labs  Lab 11/19/22 1446 11/22/22 0058 11/23/22 0701 11/24/22 0623 11/25/22 0540  NA 135 130* 131* 134* 137  K 3.6 3.4* 3.1* 2.8* 4.6  CL 101 101 103 102 106  CO2 25 19* 21* 22 22  GLUCOSE 127* 172* 121* 144* 162*  BUN 29* 27* 19 14 16   CREATININE 0.94 1.02* 0.78 0.76 0.71  CALCIUM 9.2 8.4* 7.6* 8.1* 8.8*  MG  --   --  2.1 2.1 2.2    GFR: Estimated Creatinine Clearance: 55.5 mL/min (by C-G formula based on SCr of 0.71 mg/dL).  Liver Function Tests: Recent Labs  Lab 11/19/22 1446 11/22/22 0058 11/23/22 0701  AST 11* 14* 14*  ALT 17 16 15   ALKPHOS 34* 35* 31*  BILITOT 0.6 1.0 0.5  PROT 7.4 7.5 6.4*  ALBUMIN 4.3 4.0 2.9*    CBG: Recent Labs  Lab 11/24/22 1632 11/24/22 2034 11/25/22 0740 11/25/22 1240 11/25/22 1610  GLUCAP 136* 187* 153* 164* 108*     Recent Results (from the past 240 hour(s))  Culture, blood (Routine x 2)     Status: None (Preliminary result)   Collection Time: 11/22/22 12:58 AM   Specimen: BLOOD  Result Value Ref Range Status   Specimen Description   Final    BLOOD LA Performed at [facility], [place] [address], [place], [place] [address]    Special Requests   Final    BOTTLES DRAWN AEROBIC AND ANAEROBIC Blood Culture results may not be optimal due to an excessive volume of blood received in culture bottles Performed at [facility] [address] [place], [place] [address]    Culture   Final    NO GROWTH 3 DAYS Performed at [facility], [place] [address], [place], [place] [address]    Report Status PENDING  Incomplete  Culture, blood  (Routine x 2)     Status: None (Preliminary result)   Collection Time: 11/22/22 12:58 AM   Specimen: BLOOD  Result Value Ref Range Status   Specimen Description   Final    BLOOD BLOOD LEFT HAND Performed at [facility] [address] [place] [address], [place], [place] [address]    Special Requests   Final    BOTTLES DRAWN AEROBIC AND ANAEROBIC Blood Culture results may not be optimal due to an excessive volume of blood received in culture bottles Performed at [facility] [address] [place], [place] [address]    Culture   Final    NO GROWTH 3 DAYS Performed at [facility], [place] [address], [place], [place] [address]    Report Status PENDING  Incomplete  Resp panel by RT-PCR (RSV, Flu Iraida Cragin&B, Covid)     Status: None   Collection Time: 11/22/22  1:20 AM   Specimen: Nasal Swab  Result Value Ref Range Status   SARS Coronavirus 2 by RT PCR NEGATIVE NEGATIVE Final    Comment: (NOTE) SARS-CoV-2 target nucleic acids are NOT DETECTED.  The SARS-CoV-2 RNA is generally detectable in upper respiratory specimens during the acute phase of infection. The lowest concentration of SARS-CoV-2 viral copies this assay can detect is 138 copies/mL. Jazzy Parmer negative result does not preclude SARS-Cov-2 infection and should not be used as the sole basis for treatment or other patient management decisions. Ollie Esty negative result may occur with  improper specimen collection/handling, submission of specimen other than nasopharyngeal swab, presence of viral mutation(s) within the areas targeted by this assay, and inadequate number of viral copies(<138 copies/mL). Damani Rando negative result must be combined with clinical observations, patient history, and epidemiological information. The expected result is Negative.  Fact Sheet for Patients:  EntrepreneurPulse.com.au  Fact Sheet for Healthcare Providers:  IncredibleEmployment.be  This test is no t yet  approved or cleared by the [place] FDA and  has been authorized for detection and/or diagnosis of SARS-CoV-2 by FDA under an Emergency Use Authorization (EUA). This EUA will remain  in effect (meaning this test can be used) for the duration of the COVID-19 declaration under Section 564(b)(1) of the Act, 21 U.S.C.section 360bbb-3(b)(1), unless the authorization is terminated  or revoked sooner.       Influenza [place] by PCR NEGATIVE NEGATIVE Final   Influenza B by PCR NEGATIVE NEGATIVE Final    Comment: (NOTE) The Xpert Xpress SARS-CoV-2/FLU/RSV plus assay is intended as an aid in the diagnosis of influenza from Nasopharyngeal swab specimens and should not be used as Takina Busser sole basis for treatment. Nasal washings and aspirates are unacceptable for Xpert Xpress SARS-CoV-2/FLU/RSV testing.  Fact Sheet for Patients: EntrepreneurPulse.com.au  Fact Sheet for Healthcare Providers: IncredibleEmployment.be  This test is not yet approved or cleared by the [place] FDA and has been authorized for detection and/or diagnosis of SARS-CoV-2 by FDA under an Emergency Use Authorization (EUA). This EUA will remain in effect (meaning this test can be used) for the duration of the COVID-19 declaration under Section 564(b)(1) of the Act, 21 U.S.C. section 360bbb-3(b)(1), unless the authorization is terminated or revoked.     Resp Syncytial Virus by PCR NEGATIVE NEGATIVE Final    Comment: (NOTE) Fact Sheet for Patients: EntrepreneurPulse.com.au  Fact Sheet for Healthcare Providers: IncredibleEmployment.be  This test is not yet approved or cleared by the [place] FDA and has been authorized for detection and/or diagnosis of SARS-CoV-2 by FDA under an Emergency Use Authorization (EUA). This EUA will remain in effect (meaning this test can be used) for the duration of the COVID-19 declaration under Section 564(b)(1) of  the Act, 21 U.S.C. section 360bbb-3(b)(1), unless the authorization is terminated or revoked.  Performed at [facility], [place] [address], [place], [place] [address]   MRSA Next Gen by PCR, Nasal     Status: Abnormal   Collection Time: 11/22/22  5:52 PM   Specimen: Nasal Mucosa; Nasal Swab  Result Value Ref Range Status   MRSA by PCR Next Gen DETECTED (Sumner Boesch) NOT DETECTED Final    Comment: RESULT CALLED TO, READ BACK BY AND VERIFIED WITH: rn ammi l at [place] 11/22/22 cru[place]hank Destony Prevost (NOTE) The GeneXpert MRSA Assay (FDA approved for NASAL specimens only), is one component of [facility] Polio comprehensive MRSA colonization surveillance program. It is not intended to diagnose MRSA infection nor to guide or monitor treatment for MRSA infections. Test performance is not FDA approved in patients less than 57 years old. Performed at [facility], [place] [address], [place], [place] [address]          Radiology Studies: No results found.      Scheduled Meds:  aspirin  325 mg Oral Daily   Chlorhexidine Gluconate Cloth  6 each Topical Q0600   citalopram  40 mg Oral Daily   docusate sodium  100 mg Oral BID   enoxaparin (LOVENOX) injection  40 mg Subcutaneous Q24H   ezetimibe  10 mg Oral QODAY   insulin aspart  0-15 Units Subcutaneous TID WC   insulin aspart  0-5 Units Subcutaneous QHS   metoprolol tartrate  12.5 mg Oral BID   mupirocin ointment  1 Application Nasal BID   pantoprazole  40 mg Oral Daily   valACYclovir  500 mg Oral BID   Continuous Infusions:  cefTRIAXone (ROCEPHIN)  IV 2 g (11/25/22 1019)   vancomycin Stopped (11/24/22 2326)     LOS: 3 days    Time spent: over 30 min    Fayrene Helper, MD [facility]   To contact the  attending provider between 7A-7P or the covering provider during after hours 7P-7A, please log into the web site [email or web address] and access using universal [place] password for that web site. If you do not have  the password, please call the hospital operator.  11/25/2022, 4:37 PM

## 2022-11-25 NOTE — Progress Notes (Signed)
Oncology CC: Patient has been admitted with mild source and febrile neutropenia with concern for breast cellulitis and infection with a fever of 102.8 in the emergency room.     Latest Ref Rng & Units 11/25/2022    5:40 AM 11/24/2022    6:23 AM 11/23/2022    7:01 AM  CBC  WBC 4.0 - 10.5 K/uL 4.9  1.0  0.5   Hemoglobin 12.0 - 15.0 g/dL 11.3  9.7  10.1   Hematocrit 36.0 - 46.0 % 34.7  28.6  30.9   Platelets 150 - 400 K/uL 223  168  135       Latest Ref Rng & Units 11/25/2022    5:40 AM 11/24/2022    6:23 AM 11/23/2022    7:01 AM  CMP  Glucose 70 - 99 mg/dL 162  144  121   BUN 8 - 23 mg/dL 16  14  19    Creatinine 0.44 - 1.00 mg/dL 0.71  0.76  0.78   Sodium 135 - 145 mmol/L 137  134  131   Potassium 3.5 - 5.1 mmol/L 4.6  2.8  3.1   Chloride 98 - 111 mmol/L 106  102  103   CO2 22 - 32 mmol/L 22  22  21    Calcium 8.9 - 10.3 mg/dL 8.8  8.1  7.6   Total Protein 6.5 - 8.1 g/dL   6.4   Total Bilirubin 0.3 - 1.2 mg/dL   0.5   Alkaline Phos 38 - 126 U/L   31   AST 15 - 41 U/L   14   ALT 0 - 44 U/L   15    Treatment plan: Neutropenic fever: We did administer her daily Neupogen injections.  Her white count has fully recovered. Breast packing: Patient is healing very slowly from this Mild source: Sodium bicarbonate and Magic mouthwash. Chemotherapy plans: I discussed with the patient that I am nervous about continuing with additional systemic chemotherapy.  For the time being we are not planning to do any treatment and will await the improvement in the breast infection before starting back on her treatment.

## 2022-11-26 DIAGNOSIS — D709 Neutropenia, unspecified: Secondary | ICD-10-CM | POA: Diagnosis not present

## 2022-11-26 DIAGNOSIS — R5081 Fever presenting with conditions classified elsewhere: Secondary | ICD-10-CM | POA: Diagnosis not present

## 2022-11-26 LAB — CBC WITH DIFFERENTIAL/PLATELET
Abs Immature Granulocytes: 3.56 10*3/uL — ABNORMAL HIGH (ref 0.00–0.07)
Basophils Absolute: 0.1 10*3/uL (ref 0.0–0.1)
Basophils Relative: 0 %
Eosinophils Absolute: 0.4 10*3/uL (ref 0.0–0.5)
Eosinophils Relative: 3 %
HCT: 36 % (ref 36.0–46.0)
Hemoglobin: 12 g/dL (ref 12.0–15.0)
Immature Granulocytes: 24 %
Lymphocytes Relative: 10 %
Lymphs Abs: 1.6 10*3/uL (ref 0.7–4.0)
MCH: 31.8 pg (ref 26.0–34.0)
MCHC: 33.3 g/dL (ref 30.0–36.0)
MCV: 95.5 fL (ref 80.0–100.0)
Monocytes Absolute: 1.4 10*3/uL — ABNORMAL HIGH (ref 0.1–1.0)
Monocytes Relative: 9 %
Neutro Abs: 8.2 10*3/uL — ABNORMAL HIGH (ref 1.7–7.7)
Neutrophils Relative %: 54 %
Platelets: 261 10*3/uL (ref 150–400)
RBC: 3.77 MIL/uL — ABNORMAL LOW (ref 3.87–5.11)
RDW: 14.6 % (ref 11.5–15.5)
WBC: 15.2 10*3/uL — ABNORMAL HIGH (ref 4.0–10.5)
nRBC: 1.4 % — ABNORMAL HIGH (ref 0.0–0.2)

## 2022-11-26 LAB — BASIC METABOLIC PANEL
Anion gap: 11 (ref 5–15)
BUN: 16 mg/dL (ref 8–23)
CO2: 21 mmol/L — ABNORMAL LOW (ref 22–32)
Calcium: 8.4 mg/dL — ABNORMAL LOW (ref 8.9–10.3)
Chloride: 103 mmol/L (ref 98–111)
Creatinine, Ser: 0.75 mg/dL (ref 0.44–1.00)
GFR, Estimated: >60 mL/min (ref >60)
Glucose, Bld: 166 mg/dL — ABNORMAL HIGH (ref 70–99)
Potassium: 3.5 mmol/L (ref 3.5–5.1)
Sodium: 135 mmol/L (ref 135–145)

## 2022-11-26 LAB — GLUCOSE, CAPILLARY
Glucose-Capillary: 151 mg/dL — ABNORMAL HIGH (ref 70–99)
Glucose-Capillary: 169 mg/dL — ABNORMAL HIGH (ref 70–99)
Glucose-Capillary: 217 mg/dL — ABNORMAL HIGH (ref 70–99)

## 2022-11-26 LAB — MAGNESIUM: Magnesium: 2 mg/dL (ref 1.7–2.4)

## 2022-11-26 MED ORDER — SODIUM BICARBONATE/SODIUM CHLORIDE MOUTHWASH: OROMUCOSAL | Status: DC

## 2022-11-26 MED ORDER — DOXYCYCLINE HYCLATE 100 MG PO TABS: 100.0000 mg | ORAL_TABLET | Freq: Two times a day (BID) | ORAL | 0 refills | Status: DC

## 2022-11-26 MED ORDER — INSULIN ASPART 100 UNIT/ML IJ SOLN: 0.0000 [IU] | Freq: Every day | INTRAMUSCULAR | 0 refills | Status: DC

## 2022-11-26 MED ORDER — AMOXICILLIN-POT CLAVULANATE 875-125 MG PO TABS: 1.0000 | ORAL_TABLET | Freq: Two times a day (BID) | ORAL | 0 refills | Status: AC

## 2022-11-26 MED ORDER — DOXYCYCLINE HYCLATE 100 MG PO TABS: 100.0000 mg | ORAL_TABLET | Freq: Two times a day (BID) | ORAL | Status: DC

## 2022-11-26 MED ORDER — AMOXICILLIN-POT CLAVULANATE 875-125 MG PO TABS
1.0000 | ORAL_TABLET | Freq: Two times a day (BID) | ORAL | Status: DC
  Administered 2022-11-26: 1 via ORAL
  Filled 2022-11-26: qty 1

## 2022-11-26 MED ORDER — TRESIBA FLEXTOUCH 200 UNIT/ML ~~LOC~~ SOPN: 32.0000 [IU] | PEN_INJECTOR | Freq: Every day | SUBCUTANEOUS | Status: AC

## 2022-11-26 MED ORDER — INSULIN ASPART 100 UNIT/ML IJ SOLN: 0.0000 [IU] | Freq: Three times a day (TID) | INTRAMUSCULAR | 0 refills | Status: DC

## 2022-11-26 NOTE — Progress Notes (Signed)
[facility] here to pickup patient to transport to [facility]. All patient affects returned to spouse who is at bedside.

## 2022-11-26 NOTE — Progress Notes (Signed)
PROGRESS NOTE    Karina Foster  [id number] DOB: 05-Sep-1941 DOA: 11/21/2022 PCP: Karina Solian, MD  Chief Complaint  Patient presents with   Weakness   Emesis    Brief Narrative:   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  She was recently seen and follow-up on 11/19/2022.  She was started on Magic mouthwash due to some mouth sores that developed.  She has also had ongoing packing and dressing changes from Karina Foster dehiscence and was seen outpt by surgery with recommendations to continue packing.  She presented to the hospital this admission due to significant malaise and development of fever. She was found to have temperature 102.8 on workup in the ER with tachycardia and severe neutropenia.  She was started on empiric antibiotics with vancomycin and cefepime.  Assessment & Plan:   Principal Problem:   Neutropenic fever ([place]) Active Problems:   Severe neutropenia (HCC)   Malignant neoplasm of upper-outer quadrant of right breast in female, estrogen receptor positive (HCC)   UTI (urinary tract infection)   Hypokalemia   BPPV (benign paroxysmal positional vertigo)   Hyperlipemia   Port-Karina Foster in place   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[place])   Essential hypertension  Neutropenic fever ([place]) - patient presented with fever, 102.8 and severe neutropenia.  First chemo cycle was on 11/12/2022.  Sources include possible right breast cellulitis and/or possible UTI given some mild discomfort with urination (UA negative for evidence of infection but she did note improvement in that symptom after starting abx on admission) - last fever 3/19  - UA with negative LE, nitrite  - CXR clear; CT abdomen/pelvis shows mild bladder wall thickening and s/p mastectomy changes on the right with small focus of air in the subcutaneous tissues, no abscess -Continue on  vancomycin and ceftriaxone for now -> narrow to PO abx.  MRSA swab positive.  Once de-escalated to oral regimen, will need to cover for MRSA given mastectomy site still being packed and healing   Severe neutropenia (HCC) - ANC 200 on 11/24/22 - s/p cyclophosphamide (nadir ~ 2 weeks), fluorouracil (nadir 9-14 days), methotrexate on 11/12/22.  - case was discussed with Dr. Lindi Foster. ANC improved, hold further granix. - c/o mouth pain, suspect gingivostomatitis -> magic mouth wash, bicarb mouthwash - CBC w/ diff daily - continue neutropenic precautions    UTI (urinary tract infection) - UA negative but questionable symptoms - continue empiric coverage for now   Malignant neoplasm of upper-outer quadrant of right breast in female, estrogen receptor positive ([place]) - s/p right mastectomy 10/14/22; follows with surgery and oncology outpatient - general surgery recommending BID wet to dry dressings -Continue outpatient follow-up with oncology - Continue treatment for right breast cellulitis and cover for MRSA at discharge   Hypokalemia resolved   Essential hypertension - Continue Lopressor - Ramipril on hold   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[place]) - Continue SSI and CBG monitoring      DVT prophylaxis: lovenox Code Status: full Family Communication: none Disposition:   Status is: Inpatient Remains inpatient appropriate because: further improvement, continued neutropenia   Consultants:  oncology  Procedures:  none  Antimicrobials:  Anti-infectives (From admission, onward)    Start     Dose/Rate Route Frequency Ordered Stop   11/26/22 2200  doxycycline (VIBRA-TABS) tablet 100 mg  100 mg Oral Every 12 hours 11/26/22 1403     11/26/22 1700  amoxicillin-clavulanate (AUGMENTIN) 875-125 MG per tablet 1 tablet        1 tablet Oral 2 times daily with meals 11/26/22 1403     11/25/22 1000  cefTRIAXone (ROCEPHIN) 2 g in sodium chloride 0.9 %  100 mL IVPB  Status:  Discontinued        2 g 200 mL/hr over 30 Minutes Intravenous Every 24 hours 11/25/22 0804 11/26/22 1403   11/24/22 2200  vancomycin (VANCOREADY) IVPB 1250 mg/250 mL  Status:  Discontinued        1,250 mg 166.7 mL/hr over 90 Minutes Intravenous Every 24 hours 11/24/22 1050 11/26/22 1404   11/22/22 2200  vancomycin (VANCOCIN) IVPB 1000 mg/200 mL premix  Status:  Discontinued        1,000 mg 200 mL/hr over 60 Minutes Intravenous Every 24 hours 11/22/22 0749 11/24/22 1050   11/22/22 1300  valACYclovir (VALTREX) tablet 500 mg        500 mg Oral 2 times daily 11/22/22 0728     11/22/22 1000  ceFEPIme (MAXIPIME) 2 g in sodium chloride 0.9 % 100 mL IVPB  Status:  Discontinued        2 g 200 mL/hr over 30 Minutes Intravenous Every 12 hours 11/22/22 0722 11/25/22 0804   11/22/22 0245  vancomycin (VANCOREADY) IVPB 1500 mg/300 mL        1,500 mg 150 mL/hr over 120 Minutes Intravenous  Once 11/22/22 0233 11/22/22 0616   11/22/22 0245  ceFEPIme (MAXIPIME) 2 g in sodium chloride 0.9 % 100 mL IVPB        2 g 200 mL/hr over 30 Minutes Intravenous  Once 11/22/22 0233 11/22/22 0336   11/22/22 0045  cefTRIAXone (ROCEPHIN) 1 g in sodium chloride 0.9 % 100 mL IVPB        1 g 200 mL/hr over 30 Minutes Intravenous  Once 11/22/22 0034 11/22/22 0141       Subjective: No new complaints Daughter in law and son at bedside Ate decently well yesterday  Objective: Vitals:   11/25/22 0411 11/25/22 1421 11/25/22 2104 11/26/22 1325  BP: 129/61 100/64 139/69 113/62  Pulse: 73 85 84 86  Resp: 18 16 18 18   Temp: 97.8 F (36.6 C) 99.8 F (37.7 C) 98 F (36.7 C) 98.4 F (36.9 C)  TempSrc: Oral Oral Oral Oral  SpO2: 98% 94% 96% 95%  Weight:      Height:        Intake/Output Summary (Last 24 hours) at 11/26/2022 1500 Last data filed at 11/26/2022 1325 Gross per 24 hour  Intake 730 ml  Output 1000 ml  Net -270 ml   Filed Weights   11/22/22 0009  Weight: 70.3 kg     Examination:  General: No acute distress. Cardiovascular: RRR Lungs: unlabored. Abdomen: Soft, nontender, nondistended  Neurological: Alert and oriented 3. Moves all extremities 4 with equal strength. Cranial nerves II through XII grossly intact. Extremities: No clubbing or cyanosis. No edema.  Data Reviewed: I have personally reviewed following labs and imaging studies  CBC: Recent Labs  Lab 11/22/22 0058 11/23/22 0701 11/24/22 0623 11/25/22 0540 11/26/22 0655  WBC 0.6* 0.5* 1.0* 4.9 15.2*  NEUTROABS 0.3* 0.2* 0.2* 2.6 8.2*  HGB 11.0* 10.1* 9.7* 11.3* 12.0  HCT 32.4* 30.9* 28.6* 34.7* 36.0  MCV 95.6 96.9 94.1 96.9 95.5  PLT 160 135* 168 223 0000000    Basic Metabolic Panel: Recent  Labs  Lab 11/22/22 0058 11/23/22 0701 11/24/22 0623 11/25/22 0540 11/26/22 0655  NA 130* 131* 134* 137 135  K 3.4* 3.1* 2.8* 4.6 3.5  CL 101 103 102 106 103  CO2 19* 21* 22 22 21*  GLUCOSE 172* 121* 144* 162* 166*  BUN 27* 19 14 16 16   CREATININE 1.02* 0.78 0.76 0.71 0.75  CALCIUM 8.4* 7.6* 8.1* 8.8* 8.4*  MG  --  2.1 2.1 2.2 2.0    GFR: Estimated Creatinine Clearance: 55.5 mL/min (by C-G formula based on SCr of 0.75 mg/dL).  Liver Function Tests: Recent Labs  Lab 11/22/22 0058 11/23/22 0701  AST 14* 14*  ALT 16 15  ALKPHOS 35* 31*  BILITOT 1.0 0.5  PROT 7.5 6.4*  ALBUMIN 4.0 2.9*    CBG: Recent Labs  Lab 11/25/22 1240 11/25/22 1610 11/25/22 2107 11/26/22 0738 11/26/22 1119  GLUCAP 164* 108* 149* 151* 169*     Recent Results (from the past 240 hour(s))  Culture, blood (Routine x 2)     Status: None (Preliminary result)   Collection Time: 11/22/22 12:58 AM   Specimen: BLOOD  Result Value Ref Range Status   Specimen Description   Final    BLOOD LA Performed at [facility] [address] [place], [place] [address]    Special Requests   Final    BOTTLES DRAWN AEROBIC AND ANAEROBIC Blood Culture results may not be optimal due to an  excessive volume of blood received in culture bottles Performed at [facility] [address] [place], [place] [address]    Culture   Final    NO GROWTH 4 DAYS Performed at [facility], [place] [address], [place], [place] [address]    Report Status PENDING  Incomplete  Culture, blood (Routine x 2)     Status: None (Preliminary result)   Collection Time: 11/22/22 12:58 AM   Specimen: BLOOD  Result Value Ref Range Status   Specimen Description   Final    BLOOD BLOOD LEFT HAND Performed at [facility] [address], [place], [place] [address]    Special Requests   Final    BOTTLES DRAWN AEROBIC AND ANAEROBIC Blood Culture results may not be optimal due to an excessive volume of blood received in culture bottles Performed at [facility] [address], [place], [place] [address]    Culture   Final    NO GROWTH 4 DAYS Performed at [facility], [place] [address] [place], [place] [address]    Report Status PENDING  Incomplete  Resp panel by RT-PCR (RSV, Flu Emberleigh Reily&B, Covid)     Status: None   Collection Time: 11/22/22  1:20 AM   Specimen: Nasal Swab  Result Value Ref Range Status   SARS Coronavirus 2 by RT PCR NEGATIVE NEGATIVE Final    Comment: (NOTE) SARS-CoV-2 target nucleic acids are NOT DETECTED.  The SARS-CoV-2 RNA is generally detectable in upper respiratory specimens during the acute phase of infection. The lowest concentration of SARS-CoV-2 viral copies this assay can detect is 138 copies/mL. Alvon Nygaard negative result does not preclude SARS-Cov-2 infection and should not be used as the sole basis for treatment or other patient management decisions. Apollonia Amini negative result may occur with  improper specimen collection/handling, submission of specimen other than nasopharyngeal swab, presence of viral mutation(s) within the areas targeted by this assay, and inadequate number of viral copies(<138 copies/mL). Valerian Jewel negative  result must be  combined with clinical observations, patient history, and epidemiological information. The expected result is Negative.  Fact Sheet for Patients:  EntrepreneurPulse.com.au  Fact Sheet for Healthcare Providers:  IncredibleEmployment.be  This test is no t yet approved or cleared by the [place] FDA and  has been authorized for detection and/or diagnosis of SARS-CoV-2 by FDA under an Emergency Use Authorization (EUA). This EUA will remain  in effect (meaning this test can be used) for the duration of the COVID-19 declaration under Section 564(b)(1) of the Act, 21 U.S.C.section 360bbb-3(b)(1), unless the authorization is terminated  or revoked sooner.       Influenza Connie Hilgert by PCR NEGATIVE NEGATIVE Final   Influenza B by PCR NEGATIVE NEGATIVE Final    Comment: (NOTE) The Xpert Xpress SARS-CoV-2/FLU/RSV plus assay is intended as an aid in the diagnosis of influenza from Nasopharyngeal swab specimens and should not be used as Ezequiel Macauley sole basis for treatment. Nasal washings and aspirates are unacceptable for Xpert Xpress SARS-CoV-2/FLU/RSV testing.  Fact Sheet for Patients: EntrepreneurPulse.com.au  Fact Sheet for Healthcare Providers: IncredibleEmployment.be  This test is not yet approved or cleared by the [place] FDA and has been authorized for detection and/or diagnosis of SARS-CoV-2 by FDA under an Emergency Use Authorization (EUA). This EUA will remain in effect (meaning this test can be used) for the duration of the COVID-19 declaration under Section 564(b)(1) of the Act, 21 U.S.C. section 360bbb-3(b)(1), unless the authorization is terminated or revoked.     Resp Syncytial Virus by PCR NEGATIVE NEGATIVE Final    Comment: (NOTE) Fact Sheet for Patients: EntrepreneurPulse.com.au  Fact Sheet for Healthcare Providers: IncredibleEmployment.be  This  test is not yet approved or cleared by the [place] FDA and has been authorized for detection and/or diagnosis of SARS-CoV-2 by FDA under an Emergency Use Authorization (EUA). This EUA will remain in effect (meaning this test can be used) for the duration of the COVID-19 declaration under Section 564(b)(1) of the Act, 21 U.S.C. section 360bbb-3(b)(1), unless the authorization is terminated or revoked.  Performed at [facility], [place] [address], [place], [place] [address]   MRSA Next Gen by PCR, Nasal     Status: Abnormal   Collection Time: 11/22/22  5:52 PM   Specimen: Nasal Mucosa; Nasal Swab  Result Value Ref Range Status   MRSA by PCR Next Gen DETECTED (Stephanine Reas) NOT DETECTED Final    Comment: RESULT CALLED TO, READ BACK BY AND VERIFIED WITH: rn ammi l at [place] 11/22/22 [place] Kynzleigh Bandel (NOTE) The GeneXpert MRSA Assay (FDA approved for NASAL specimens only), is one component of Kynzlee Hucker comprehensive MRSA colonization surveillance program. It is not intended to diagnose MRSA infection nor to guide or monitor treatment for MRSA infections. Test performance is not FDA approved in patients less than 59 years old. Performed at [facility], [place] [address] [place], [place] [address]          Radiology Studies: No results found.      Scheduled Meds:  amoxicillin-clavulanate  1 tablet Oral BID WC   aspirin  325 mg Oral Daily   Chlorhexidine Gluconate Cloth  6 each Topical Q0600   citalopram  40 mg Oral Daily   docusate sodium  100 mg Oral BID   doxycycline  100 mg Oral Q12H   enoxaparin (LOVENOX) injection  40 mg Subcutaneous Q24H   ezetimibe  10 mg Oral QODAY   insulin aspart  0-15 Units Subcutaneous TID WC   insulin aspart  0-5 Units Subcutaneous QHS   metoprolol tartrate  12.5 mg Oral BID   mupirocin ointment  1 Application Nasal BID   pantoprazole  40 mg Oral Daily   valACYclovir  500 mg Oral BID   Continuous Infusions:     LOS: 4  days    Time spent: over 30 min    Fayrene Helper, MD [facility]   To contact the attending provider between 7A-7P or the covering provider during after hours 7P-7A, please log into the web site [email or web address] and access using universal [place] password for that web site. If you do not have the password, please call the hospital operator.  11/26/2022, 3:00 PM

## 2022-11-26 NOTE — Discharge Summary (Signed)
Physician Discharge Summary  Karina Foster [id number] DOB: 1940/10/27 DOA: 11/21/2022  PCP: [name], MD  Admit date: 11/21/2022 Discharge date: 11/26/2022  Time spent: 40 minutes  Recommendations for Outpatient Follow-up:  Follow outpatient CBC/CMP  Follow with surgery outpatient for mastectomy wound Follow cellulitis outpatient on antibiotics, adjust abx course as needed Insulin regimen reduced, follow outpatient - adjust as needed Ensure PCP, oncology, surgery follow up   Discharge Diagnoses:  Principal Problem:   Neutropenic fever ([place]) Active Problems:   Severe neutropenia ([place])   Malignant neoplasm of upper-outer quadrant of right breast in female, estrogen receptor positive ([place])   UTI (urinary tract infection)   Hypokalemia   BPPV (benign paroxysmal positional vertigo)   Hyperlipemia   Port-Elizbeth Posa-Cath in place   Type 2 diabetes mellitus with other diabetic neurological complication [facility]   Essential hypertension   Discharge Condition: stable  Diet recommendation: heart healthy  Filed Weights   11/22/22 0009  Weight: 70.3 kg    History of present illness:   Karina Foster is Karina Foster 82 yo female with PMH 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  She was recently seen and follow-up on 11/19/2022.  She was started on Magic mouthwash due to some mouth sores that developed.  She has also had ongoing packing and dressing changes from Marji Kuehnel dehiscence and was seen outpt by surgery with recommendations to continue packing.   She presented to the hospital this admission due to significant malaise and development of fever.  She was found to have temperature 102.8 on workup in the ER with tachycardia and severe neutropenia.  Treated for neutropenic fever.  Has evidence of skin and soft tissue infection at site of mastectomy site.  Surgery c/s and recommended continued wet to dry dressing changes for her wound.  Discharging  with 7 additional days of PO abx.  See below for additional details  Hospital Course:  Assessment and Plan:  Neutropenic fever ([place])  Skin and Soft Tissue Infection - patient presented with fever, 102.8 and severe neutropenia.  First chemo cycle was on 11/12/2022.  Sources include skin soft tissue infection at site of right mastectomy and/or possible UTI given some mild discomfort with urination (UA negative for evidence of infection but she did note improvement in that symptom after starting abx on admission) - last fever 3/19  - UA with negative LE, nitrite  - CXR clear; CT abdomen/pelvis shows mild bladder wall thickening and s/p mastectomy changes on the right with small focus of air in the subcutaneous tissues, no abscess -Continue on vancomycin and ceftriaxone for now -> narrow to PO abx with augmentin and doxycycline.  Plan for another 7 days abx.  MRSA swab positive.  Once de-escalated to oral regimen, will need to cover for MRSA given mastectomy site still being packed and healing   Severe neutropenia (HCC) - ANC 200 on 11/24/22 - s/p cyclophosphamide (nadir ~ 2 weeks), fluorouracil (nadir 9-14 days), methotrexate on 11/12/22.  - case was discussed with Dr. Lindi Adie. ANC improved - now leukocytosis, granix d/c'd. - c/o mouth pain, suspect gingivostomatitis -> magic mouth wash, bicarb mouthwash - CBC w/ diff daily - continue neutropenic precautions    Dysuria  - UA negative but questionable symptoms - continue empiric coverage for now - no culture collected - improved after abx, follow    Malignant neoplasm of upper-outer quadrant of right breast in female, estrogen receptor positive ([place]) - s/p right mastectomy 10/14/22;  follows with surgery and oncology outpatient - general surgery recommending BID wet to dry dressings -- follow with Dr. Lindi Adie outpatient - Continue treatment for right breast cellulitis and cover for MRSA at discharge   Hypokalemia resolved   Essential  hypertension - Continue Lopressor, ramipril    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HCC) Reduced insulin at discharge, her A1c last month was 6.2, BG's here without long acting insulin have been reasonable Follow blood sugars outpatient and adjust insulin regimen as needed.  Decrease tresiba to 32 units, use SSI novolog rather than 30 units TID with meals. Adjust outpatient as needed     Procedures: none   Consultations: Surgery oncology  Discharge Exam: Vitals:   11/25/22 2104 11/26/22 1325  BP: 139/69 113/62  Pulse: 84 86  Resp: 18 18  Temp: 98 F (36.7 C) 98.4 F (36.9 C)  SpO2: 96% 95%   No new complaints Ate 100% breakfast   Mastectomy site examined, wound packed, no drainage noted, surrounding erythema and some induration, but no fluctuance appreciated Unlabored breathing Alert No LEE  Discharge Instructions   Discharge Instructions     Call MD for:  difficulty breathing, headache or visual disturbances   Complete by: As directed    Call MD for:  extreme fatigue   Complete by: As directed    Call MD for:  hives   Complete by: As directed    Call MD for:  persistant dizziness or light-headedness   Complete by: As directed    Call MD for:  persistant nausea and vomiting   Complete by: As directed    Call MD for:  redness, tenderness, or signs of infection (pain, swelling, redness, odor or green/yellow discharge around incision site)   Complete by: As directed    Call MD for:  severe uncontrolled pain   Complete by: As directed    Call MD for:  temperature >100.4   Complete by: As directed    Diet - low sodium heart healthy   Complete by: As directed    Discharge instructions   Complete by: As directed    You were seen for neutropenic fever and cellulitis (skin infection).  You've improved with granix and antibiotics.  We'll plan to discharge you home with oral antibiotics.  You'll go home with augmentin and  doxycycline for the next 7 days.  Follow up with Dr. Lindi Adie as an outpatient.  Continue the magic mouthwash as needed for your mouth sores.  Your blood sugars have been reasonable off of your long acting blood sugars.  I'll reduce your long acting insulin by half and also reduce your mealtime insulin.   Return for new, recurrent, or worsening symptoms.  Please ask your PCP to request records from this hospitalization so they know what was done and what the next steps will be.   Discharge wound care:   Complete by: As directed    Pack right upper chest wound BID with moist kerlex, using swab to ensure packing all bases of wound superiorly and inferiorly into tracts/undermining.  Moisten previous packing each time and remove slowly to avoid causing bleeding. Cover with gauze or ABD and tape. Leave steristips in place until they fall off.   Increase activity slowly   Complete by: As directed       Allergies as of 11/26/2022       Reactions   Azithromycin Rash   Demerol [meperidine] Diarrhea, Nausea And Vomiting  Medication List     STOP taking these medications    HumaLOG KwikPen 100 UNIT/ML KwikPen Generic drug: insulin lispro       TAKE these medications    acetaminophen 500 MG tablet Commonly known as: TYLENOL Take 2 tablets (1,000 mg total) by mouth every 6 (six) hours. What changed:  when to take this reasons to take this   amoxicillin-clavulanate 875-125 MG tablet Commonly known as: AUGMENTIN Take 1 tablet by mouth 2 (two) times daily with [name] Sterry meal for 7 days.   aspirin 325 MG tablet Take 325 mg by mouth daily.   BD Pen Needle Nano U/F 32G X 4 MM Misc Generic drug: Insulin Pen Needle SMARTSIG:2 Pen Needle SUB-Q Daily   citalopram 40 MG tablet Commonly known as: CELEXA Take 40 mg by mouth daily.   Cranberry Soft 500 MG Chew Chew 500 mg by mouth daily.   cyanocobalamin 1000 MCG tablet Commonly known as: VITAMIN B12 Take 1,000 mcg by mouth  daily.   doxycycline 100 MG tablet Commonly known as: VIBRA-TABS Take 1 tablet (100 mg total) by mouth every 12 (twelve) hours for 7 days.   fluocinonide gel 0.05 % Commonly known as: LIDEX Apply 1 Application topically daily as needed (irritation on tongue).   FreeStyle Libre 14 Day Sensor Misc Apply topically every 14 (fourteen) days.   ibuprofen 200 MG tablet Commonly known as: ADVIL Take 200 mg by mouth every 6 (six) hours as needed for moderate pain.   insulin aspart 100 UNIT/ML injection Commonly known as: novoLOG Inject 0-15 Units into the skin 3 (three) times daily with meals. CBG < 70 treat for low blood sugar CBG 70 - 120: 0 units CBG 121 - 150: 2 units CBG 151 - 200: 3 units CBG 201 - 250: 5 units CBG 251 - 300: 8 units CBG 301 - 350: 11 units CBG 351 - 400: 15 units CBG > 400: call MD   insulin aspart 100 UNIT/ML injection Commonly known as: novoLOG Inject 0-5 Units into the skin at bedtime. CBG < 70: treat for low blood sguar CBG 70 - 120: 0 units CBG 121 - 150: 0 units CBG 151 - 200: 0 units CBG 201 - 250: 2 units CBG 251 - 300: 3 units CBG 301 - 350: 4 units CBG 351 - 400: 5 units CBG > 400: call MD   lidocaine 2 % solution Commonly known as: XYLOCAINE Use as directed 15 mLs in the mouth or throat every 6 (six) hours as needed for mouth pain.   lidocaine-prilocaine cream Commonly known as: EMLA Apply to affected area once What changed:  how much to take how to take this when to take this reasons to take this   magic mouthwash (lidocaine, diphenhydrAMINE, alum & mag hydroxide) suspension Swish and spit 5 mLs by mouth 4 (four) times daily as needed for mouth pain.   metoprolol tartrate 25 MG tablet Commonly known as: LOPRESSOR TAKE 1/2 TABLET TWICE DAILY   Nexlizet 180-10 MG Tabs Generic drug: Bempedoic Acid-Ezetimibe Take 1 tablet by mouth daily. What changed: when to take this   nitroGLYCERIN 0.4 MG SL tablet Commonly known as:  NITROSTAT Place 0.4 mg under the tongue every 5 (five) minutes as needed for chest pain.   PreserVision AREDS Caps Take 1 capsule by mouth in the morning and at bedtime.   prochlorperazine 10 MG tablet Commonly known as: COMPAZINE Take 1 tablet (10 mg total) by mouth every 6 (six) hours as needed for  nausea or vomiting.   ramipril 2.5 MG capsule Commonly known as: ALTACE Take 2.5 mg by mouth daily.   sodium bicarbonate/sodium chloride Soln Swish and spit as needed for mouth pain   tiZANidine 2 MG tablet Commonly known as: ZANAFLEX Take 1 tablet (2 mg total) by mouth every 8 (eight) hours as needed for muscle spasms.   Tyler Aas FlexTouch 200 UNIT/ML FlexTouch Pen Generic drug: insulin degludec Inject 32 Units into the skin daily after lunch. We've reduced your insulin dose, please follow up with your PCP outpatient and adjust this further as needed. What changed:  how much to take additional instructions   valACYclovir 500 MG tablet Commonly known as: VALTREX Take 1 tablet (500 mg total) by mouth 2 (two) times daily.   vitamin D3 25 MCG (1000 UT) tablet Generic drug: Cholecalciferol Take 1,000 Units by mouth daily.               Discharge Care Instructions  (From admission, onward)           Start     Ordered   11/26/22 0000  Discharge wound care:       Comments: Pack right upper chest wound BID with moist kerlex, using swab to ensure packing all bases of wound superiorly and inferiorly into tracts/undermining.  Moisten previous packing each time and remove slowly to avoid causing bleeding. Cover with gauze or ABD and tape. Leave steristips in place until they fall off.   11/26/22 1553           Allergies  Allergen Reactions   Azithromycin Rash   Demerol [Meperidine] Diarrhea and Nausea And Vomiting    Follow-up Information     Stark Klein, MD. Go on 12/13/2022.   Specialty: General Surgery Why: 1:45 PM, please arrive 15 min prior to appointment  time to check in. Contact information: [address] [place] [address] [phone]         Nicholas Lose, MD Follow up.   Specialty: Hematology and Oncology Contact information: [place] [phone] [phone]         [name], MD Follow up.   Specialty: Internal Medicine Contact information: [place] [address] [phone]                  The results of significant diagnostics from this hospitalization (including imaging, microbiology, ancillary and laboratory) are listed below for reference.    Significant Diagnostic Studies: CT ABDOMEN PELVIS W CONTRAST  Result Date: 11/22/2022 CLINICAL DATA:  Sepsis, weakness, emesis. EXAM: CT ABDOMEN AND PELVIS WITH CONTRAST TECHNIQUE: Multidetector CT imaging of the abdomen and pelvis was performed using the standard protocol following bolus administration of intravenous contrast. RADIATION DOSE REDUCTION: This exam was performed according to the departmental dose-optimization program which includes automated exposure control, adjustment of the mA and/or kV according to patient size and/or use of iterative reconstruction technique. CONTRAST:  170mL OMNIPAQUE IOHEXOL 300 MG/ML  SOLN COMPARISON:  None Available. FINDINGS: Lower chest: Coronary artery calcifications are noted. Atelectasis is noted at the lung bases. Hepatobiliary: Derick Seminara subcentimeter hypodensity is noted in the left lobe of the liver, statistically most likely cyst or hemangioma. Fatty infiltration of the liver is noted. No biliary ductal dilatation. Stones are present within the gallbladder. Pancreas: Unremarkable. No pancreatic ductal dilatation or surrounding inflammatory changes. Spleen: Normal in size without focal abnormality. Adrenals/Urinary Tract: No adrenal nodule or mass. The kidneys enhance symmetrically. Sawyer Mentzer subcentimeter hypodensity is  present in the right kidney which is too small  to further characterize. Tyteanna Ost punctate renal calculus is noted on the left. No hydroureteronephrosis bilaterally. Mild bladder wall thickening is noted on the right lateral wall. Stomach/Bowel: Stomach is within normal limits. Appendix is not seen. No evidence of bowel wall thickening, distention, or inflammatory changes. No free air or pneumatosis. Vascular/Lymphatic: Aortic atherosclerosis. No enlarged abdominal or pelvic lymph nodes. Reproductive: Uterus and bilateral adnexa are unremarkable. Other: No abdominopelvic ascites. Mastectomy changes are noted on the right with multiple foci of air in the subcutaneous soft tissues. No focal fluid collection is seen to suggest abscess. Musculoskeletal: Degenerative changes are present in the thoracolumbar spine. No acute osseous abnormality. IMPRESSION: 1. 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 No abscess is seen. 2. Mild bladder wall thickening, possible infectious or inflammatory cystitis. 3. Hepatic steatosis. 4. Cholelithiasis. 5. Nonobstructive left renal calculus. 6. Aortic atherosclerosis. Electronically Signed   By: Brett Fairy M.D.   On: 11/22/2022 02:46   DG Chest Port 1 View  Result Date: 11/22/2022 CLINICAL DATA:  [id number] Sepsis ([place]) [id number]. Breast cancer, recent chemotherapy. Nausea, vomiting, weakness EXAM: PORTABLE CHEST 1 VIEW COMPARISON:  10/14/2022 FINDINGS: Left Port-Duvid Smalls-Cath remains in place, unchanged. Heart and mediastinal contours are within normal limits. No focal opacities or effusions. No acute bony abnormality. IMPRESSION: No active cardiopulmonary disease. Electronically Signed   By: Rolm Baptise M.D.   On: 11/22/2022 01:08    Microbiology: Recent Results (from the past 240 hour(s))  Culture, blood (Routine x 2)     Status: None (Preliminary result)   Collection Time: 11/22/22 12:58 AM   Specimen: BLOOD  Result Value Ref Range Status   Specimen Description   Final    BLOOD  LA Performed at [facility] [address], [place], [place] [address]    Special Requests   Final    BOTTLES DRAWN AEROBIC AND ANAEROBIC Blood Culture results may not be optimal due to an excessive volume of blood received in culture bottles Performed at [facility] [address], [place], [place] [address]    Culture   Final    NO GROWTH 4 DAYS Performed at [facility], [place] [address] [place], [place] [address]    Report Status PENDING  Incomplete  Culture, blood (Routine x 2)     Status: None (Preliminary result)   Collection Time: 11/22/22 12:58 AM   Specimen: BLOOD  Result Value Ref Range Status   Specimen Description   Final    BLOOD BLOOD LEFT HAND Performed at [facility] [address] [place], [place] [address]    Special Requests   Final    BOTTLES DRAWN AEROBIC AND ANAEROBIC Blood Culture results may not be optimal due to an excessive volume of blood received in culture bottles Performed at [facility] [address], [place], [place] [address]    Culture   Final    NO GROWTH 4 DAYS Performed at [facility], [place] [address], [place], [place] [address]    Report Status PENDING  Incomplete  Resp panel by RT-PCR (RSV, Flu Semaj Kham&B, Covid)     Status: None   Collection Time: 11/22/22  1:20 AM   Specimen: Nasal Swab  Result Value Ref Range Status   SARS Coronavirus 2 by RT PCR NEGATIVE NEGATIVE Final    Comment: (NOTE) SARS-CoV-2 target nucleic acids  are NOT DETECTED.  The SARS-CoV-2 RNA is generally detectable in upper respiratory specimens during the acute phase of infection. The lowest concentration of SARS-CoV-2 viral copies this assay can detect is 138 copies/mL. Urania Pearlman negative result does not preclude SARS-Cov-2 infection and should not be used as the sole basis for treatment or other patient management decisions. Tadhg Eskew negative result may occur with  improper specimen  collection/handling, submission of specimen other than nasopharyngeal swab, presence of viral mutation(s) within the areas targeted by this assay, and inadequate number of viral copies(<138 copies/mL). Martrell Eguia negative result must be combined with clinical observations, patient history, and epidemiological information. The expected result is Negative.  Fact Sheet for Patients:  EntrepreneurPulse.com.au  Fact Sheet for Healthcare Providers:  IncredibleEmployment.be  This test is no t yet approved or cleared by the [place] FDA and  has been authorized for detection and/or diagnosis of SARS-CoV-2 by FDA under an Emergency Use Authorization (EUA). This EUA will remain  in effect (meaning this test can be used) for the duration of the COVID-19 declaration under Section 564(b)(1) of the Act, 21 U.S.C.section 360bbb-3(b)(1), unless the authorization is terminated  or revoked sooner.       Influenza Dawnna Gritz by PCR NEGATIVE NEGATIVE Final   Influenza B by PCR NEGATIVE NEGATIVE Final    Comment: (NOTE) The Xpert Xpress SARS-CoV-2/FLU/RSV plus assay is intended as an aid in the diagnosis of influenza from Nasopharyngeal swab specimens and should not be used as Nhan Qualley sole basis for treatment. Nasal washings and aspirates are unacceptable for Xpert Xpress SARS-CoV-2/FLU/RSV testing.  Fact Sheet for Patients: EntrepreneurPulse.com.au  Fact Sheet for Healthcare Providers: IncredibleEmployment.be  This test is not yet approved or cleared by the [place] FDA and has been authorized for detection and/or diagnosis of SARS-CoV-2 by FDA under an Emergency Use Authorization (EUA). This EUA will remain in effect (meaning this test can be used) for the duration of the COVID-19 declaration under Section 564(b)(1) of the Act, 21 U.S.C. section 360bbb-3(b)(1), unless the authorization is terminated or revoked.     Resp Syncytial  Virus by PCR NEGATIVE NEGATIVE Final    Comment: (NOTE) Fact Sheet for Patients: EntrepreneurPulse.com.au  Fact Sheet for Healthcare Providers: IncredibleEmployment.be  This test is not yet approved or cleared by the [place] FDA and has been authorized for detection and/or diagnosis of SARS-CoV-2 by FDA under an Emergency Use Authorization (EUA). This EUA will remain in effect (meaning this test can be used) for the duration of the COVID-19 declaration under Section 564(b)(1) of the Act, 21 U.S.C. section 360bbb-3(b)(1), unless the authorization is terminated or revoked.  Performed at [facility], [place] [address], [place], [place] [address]   MRSA Next Gen by PCR, Nasal     Status: Abnormal   Collection Time: 11/22/22  5:52 PM   Specimen: Nasal Mucosa; Nasal Swab  Result Value Ref Range Status   MRSA by PCR Next Gen DETECTED (Willoughby Doell) NOT DETECTED Final    Comment: RESULT CALLED TO, READ BACK BY AND VERIFIED WITH: rn ammi l at [place] 11/22/22 [place] Jovany Disano (NOTE) The GeneXpert MRSA Assay (FDA approved for NASAL specimens only), is one component of Lucretia Pendley comprehensive MRSA colonization surveillance program. It is not intended to diagnose MRSA infection nor to guide or monitor treatment for MRSA infections. Test performance is not FDA approved in patients less than 63 years old. Performed at [facility], [place] [address] [place], [place] [address]      Labs:  Basic Metabolic Panel: Recent Labs  Lab 11/22/22 0058 11/23/22 0701 11/24/22 0623 11/25/22 0540 11/26/22 0655  NA 130* 131* 134* 137 135  K 3.4* 3.1* 2.8* 4.6 3.5  CL 101 103 102 106 103  CO2 19* 21* 22 22 21*  GLUCOSE 172* 121* 144* 162* 166*  BUN 27* 19 14 16 16   CREATININE 1.02* 0.78 0.76 0.71 0.75  CALCIUM 8.4* 7.6* 8.1* 8.8* 8.4*  MG  --  2.1 2.1 2.2 2.0   Liver Function Tests: Recent Labs  Lab 11/22/22 0058 11/23/22 0701  AST 14* 14*   ALT 16 15  ALKPHOS 35* 31*  BILITOT 1.0 0.5  PROT 7.5 6.4*  ALBUMIN 4.0 2.9*   No results for input(s): "LIPASE", "AMYLASE" in the last 168 hours. No results for input(s): "AMMONIA" in the last 168 hours. CBC: Recent Labs  Lab 11/22/22 0058 11/23/22 0701 11/24/22 0623 11/25/22 0540 11/26/22 0655  WBC 0.6* 0.5* 1.0* 4.9 15.2*  NEUTROABS 0.3* 0.2* 0.2* 2.6 8.2*  HGB 11.0* 10.1* 9.7* 11.3* 12.0  HCT 32.4* 30.9* 28.6* 34.7* 36.0  MCV 95.6 96.9 94.1 96.9 95.5  PLT 160 135* 168 223 261   Cardiac Enzymes: No results for input(s): "CKTOTAL", "CKMB", "CKMBINDEX", "TROPONINI" in the last 168 hours. BNP: BNP (last 3 results) No results for input(s): "BNP" in the last 8760 hours.  ProBNP (last 3 results) No results for input(s): "PROBNP" in the last 8760 hours.  CBG: Recent Labs  Lab 11/25/22 1240 11/25/22 1610 11/25/22 2107 11/26/22 0738 11/26/22 1119  GLUCAP 164* 108* 149* 151* 169*       Signed:  Fayrene Helper MD.  [facility] 11/26/2022, 4:02 PM

## 2022-11-26 NOTE — Progress Notes (Signed)
Regarding: Karina Foster Date of Birth: 1941-06-15 Date: 11/26/2022   To Whom It May Concern:   Please be advised that the above-named patient will require a short-term Nursing home stat - anticipated 30 days or less for rehabilitation and strengthening. The plan is for return home.

## 2022-11-26 NOTE — TOC Initial Note (Signed)
Transition of Care [facility]) - Initial/Assessment Note    Patient Details  Name: Karina Foster MRN: [id number] Date of Birth: 1941-07-21  Transition of Care [facility]) CM/SW Contact:    Angelita Ingles, RN Phone Number:[phone]  11/26/2022, 2:34 PM  Clinical Narrative:                 [facility] consulted for patient from [facility] independent living now with recommendations for SNF. CM has reached out to [place] with admissions  at [facility]. Caryl Comes confirms that patient is from [employer]  and that she would need placement at [facility]. Caryl Comes will need to speak with the nursing supervisor who will need to review patients information before return can be approved. CM has completed FL2 and initiated PASRR. Additional info will need to be submitted for PASRR.   Expected Discharge Plan: Skilled Nursing Facility Barriers to Discharge: SNF Pending bed offer (From [place] living)   Patient Goals and CMS Choice Patient states their goals for this hospitalization and ongoing recovery are:: Wants to go home          Expected Discharge Plan and Services     Post Acute Care Choice: [place] Living arrangements for the past 2 months: Apartment                                      Prior Living Arrangements/Services Living arrangements for the past 2 months: Apartment Lives with:: Spouse Patient language and need for interpreter reviewed:: Yes Do you feel safe going back to the place where you live?: Yes      Need for Family Participation in Patient Care: Yes (Comment) Care giver support system in place?: Yes (comment)   Criminal Activity/Legal Involvement Pertinent to Current Situation/Hospitalization: No - Comment as needed  Activities of Daily Living Home Assistive Devices/Equipment: None (would like to ask about one) ADL Screening (condition at time of admission) Patient's cognitive ability adequate to safely  complete daily activities?: Yes Is the patient deaf or have difficulty hearing?: No Does the patient have difficulty seeing, even when wearing glasses/contacts?: No Does the patient have difficulty concentrating, remembering, or making decisions?: No Patient able to express need for assistance with ADLs?: Yes Does the patient have difficulty dressing or bathing?: Yes Independently performs ADLs?: No Communication: Independent Dressing (OT): Needs assistance Is this a change from baseline?: Change from baseline, expected to last <3days Grooming: Needs assistance Is this a change from baseline?: Change from baseline, expected to last >3 days Feeding: Independent Bathing: Needs assistance Is this a change from baseline?: Change from baseline, expected to last <3 days Toileting: Needs assistance Is this a change from baseline?: Change from baseline, expected to last >3days In/Out Bed: Needs assistance Is this a change from baseline?: Change from baseline, expected to last <3 days Walks in Home: Needs assistance Is this a change from baseline?: Change from baseline, expected to last <3 days Does the patient have difficulty walking or climbing stairs?: Yes Weakness of Legs: Both Weakness of Arms/Hands: None  Permission Sought/Granted Permission sought to share information with : Family Supports Permission granted to share information with : No              Emotional Assessment Appearance:: Appears stated age Attitude/Demeanor/Rapport: Gracious Affect (typically observed): Pleasant Orientation: : Oriented to Self, Oriented to Place, Oriented to Situation, Oriented to  Time Alcohol /  Substance Use: Not Applicable Psych Involvement: No (comment)  Admission diagnosis:  Neutropenic fever ([place]) [D70.9, R50.81] Cystitis [N30.90] Patient Active Problem List   Diagnosis Date Noted   Neutropenic fever ([place]) 11/22/2022   UTI (urinary tract infection) 11/22/2022   Tachycardia 11/22/2022    AKI (acute kidney injury) ([place]) 11/22/2022   Hypokalemia 11/22/2022   Severe neutropenia ([place]) 11/22/2022   Port-A-Cath in place 11/19/2022   Breast cancer of upper-outer quadrant of right female breast ([place]) 10/14/2022   Hyperlipemia 10/14/2022   Malignant neoplasm of upper-outer quadrant of right breast in female, estrogen receptor positive ([place]) 09/15/2022   History of MI (myocardial infarction) 09/13/2022   BPPV (benign paroxysmal positional vertigo) 05/01/2019   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[place]) Q000111Q   Diabetic peripheral neuropathy associated with type 2 diabetes mellitus ([place]) 09/19/2012   Essential hypertension 08/13/2009   PCP:  [name], MD Pharmacy:   [place], [place] - [address] [place] [address] Phone: [phone] Fax: [phone]  [place] [address] Phone: [phone] Fax: [phone]  [place] [address] Phone: [phone] Fax: [phone]     Social Determinants of Health (SDOH) Social History: SDOH Screenings   Food Insecurity: No Food Insecurity (11/22/2022)  Housing: Low Risk  (11/22/2022)  Transportation Needs: No Transportation Needs (11/22/2022)  Utilities: Not At Risk (11/22/2022)  Tobacco Use: Medium Risk (11/22/2022)   SDOH Interventions:     Readmission Risk Interventions    11/26/2022    1:47 PM  Readmission Risk Prevention Plan  Transportation Screening Complete  PCP or Specialist Appt within 5-7 Days Complete  Home Care Screening Complete  Medication Review (RN CM) Referral to Pharmacy

## 2022-11-26 NOTE — NC FL2 (Signed)
[place] LEVEL OF CARE FORM     IDENTIFICATION  Patient Name: Karina Foster Birthdate: August 07, 1941 Sex: female Admission Date (Current Location): 11/21/2022  [facility] and [place] Number:  Herbalist and Address:  [facility],  [place], [place]      Provider Number: [id number]  Attending Physician Name and Address:  Elodia Florence., *  Relative Name and Phone Number:  Jennye Moccasin    [phone]    Current Level of Care: Hospital Recommended Level of Care: [place] Prior Approval Number:    Date Approved/Denied:   PASRR Number: pending  Discharge Plan: SNF    Current Diagnoses: Patient Active Problem List   Diagnosis Date Noted   Neutropenic fever ([place]) 11/22/2022   UTI (urinary tract infection) 11/22/2022   Tachycardia 11/22/2022   AKI (acute kidney injury) ([place]) 11/22/2022   Hypokalemia 11/22/2022   Severe neutropenia ([place]) 11/22/2022   Port-A-Cath in place 11/19/2022   Breast cancer of upper-outer quadrant of right female breast ([place]) 10/14/2022   Hyperlipemia 10/14/2022   Malignant neoplasm of upper-outer quadrant of right breast in female, estrogen receptor positive ([place]) 09/15/2022   History of MI (myocardial infarction) 09/13/2022   BPPV (benign paroxysmal positional vertigo) 05/01/2019   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[place]) Q000111Q   Diabetic peripheral neuropathy associated with type 2 diabetes mellitus ([place]) 09/19/2012   Essential hypertension 08/13/2009    Orientation RESPIRATION BLADDER Height & Weight     Self, Time, Situation, Place  Normal Continent Weight: 70.3 kg Height:  5\' 7"  (170.2 cm)  BEHAVIORAL SYMPTOMS/MOOD NEUROLOGICAL BOWEL NUTRITION STATUS     (n/a) Continent Diet  AMBULATORY STATUS COMMUNICATION OF NEEDS Skin   Limited Assist Verbally Other (Comment) (wound to right chest)                       Personal  Care Assistance Level of Assistance  Bathing, Feeding, Dressing Bathing Assistance: Limited assistance   Dressing Assistance: Limited assistance     Functional Limitations Info  Sight, Hearing, Speech   Hearing Info: Impaired Speech Info: Adequate    SPECIAL CARE FACTORS FREQUENCY  PT (By licensed PT), OT (By licensed OT)     PT Frequency: 5x/wk OT Frequency: 5x/wk            Contractures Contractures Info: Not present    Additional Factors Info  Code Status, Allergies, Psychotropic, Insulin Sliding Scale, Isolation Precautions, Suctioning Needs Code Status Info: Full Allergies Info: Azithromycin, Demerol (Meperidine) Psychotropic Info: see discharge summary Insulin Sliding Scale Info: see discharge summary Isolation Precautions Info: Protective precautions Suctioning Needs: n/a   Current Medications (11/26/2022):  This is the current hospital active medication list Current Facility-Administered Medications  Medication Dose Route Frequency Provider Last Rate Last Admin   acetaminophen (TYLENOL) tablet 650 mg  650 mg Oral Q6H PRN Hollice Gong, Mir M, MD   650 mg at 11/26/22 1256   amoxicillin-clavulanate (AUGMENTIN) 875-125 MG per tablet 1 tablet  1 tablet Oral BID WC Elodia Florence., MD       aspirin tablet 325 mg  325 mg Oral Daily Hollice Gong, Mir M, MD   325 mg at 11/26/22 0953   Chlorhexidine Gluconate Cloth 2 % PADS 6 each  6 each Topical Q0600 Kathryne Eriksson, NP   6 each at 11/26/22 0625   citalopram (CELEXA) tablet 40 mg  40 mg Oral Daily  Hollice Gong, Mir M, MD   40 mg at 11/26/22 [id number]   docusate sodium (COLACE) capsule 100 mg  100 mg Oral BID Hollice Gong, Mir M, MD   100 mg at 11/26/22 0954   doxycycline (VIBRA-TABS) tablet 100 mg  100 mg Oral Q12H Elodia Florence., MD       enoxaparin (LOVENOX) injection 40 mg  40 mg Subcutaneous Q24H Hollice Gong, Mir M, MD   40 mg at 11/26/22 1033   ezetimibe (ZETIA) tablet 10 mg  10 mg Oral Sunday Shams, Mir M, MD    10 mg at 11/26/22 0954   ibuprofen (ADVIL) tablet 600 mg  600 mg Oral Q8H PRN Elodia Florence., MD   600 mg at 11/26/22 1256   insulin aspart (novoLOG) injection 0-15 Units  0-15 Units Subcutaneous TID WC Hollice Gong, Mir M, MD   2 Units at 11/26/22 1255   insulin aspart (novoLOG) injection 0-5 Units  0-5 Units Subcutaneous QHS Hollice Gong, Mir M, MD       magic mouthwash w/lidocaine  5 mL Oral QID PRN Dwyane Dee, MD       metoprolol tartrate (LOPRESSOR) tablet 12.5 mg  12.5 mg Oral BID Hollice Gong, Mir M, MD   12.5 mg at 11/26/22 [id number]   mupirocin ointment (BACTROBAN) 2 % 1 Application  1 Application Nasal BID Kathryne Eriksson, NP   1 Application at [id number]   ondansetron (ZOFRAN) tablet 4 mg  4 mg Oral Q6H PRN Hollice Gong, Mir M, MD       Or   ondansetron [facility]) injection 4 mg  4 mg Intravenous Q6H PRN Hollice Gong, Mir M, MD       oxyCODONE (Oxy IR/ROXICODONE) immediate release tablet 5 mg  5 mg Oral Q4H PRN Hollice Gong, Mir M, MD       pantoprazole (PROTONIX) EC tablet 40 mg  40 mg Oral Daily Elodia Florence., MD   40 mg at 11/26/22 0954   polyethylene glycol (MIRALAX / GLYCOLAX) packet 17 g  17 g Oral Daily PRN Hollice Gong, Mir M, MD       prochlorperazine (COMPAZINE) tablet 10 mg  10 mg Oral Q6H PRN Hollice Gong, Mir M, MD       sodium bicarbonate/sodium chloride mouthwash 1027ml   Mouth Rinse PRN Elodia Florence., MD       tiZANidine (ZANAFLEX) tablet 2 mg  2 mg Oral Q8H PRN Hollice Gong, Mir M, MD       traZODone (DESYREL) tablet 25 mg  25 mg Oral QHS PRN Hollice Gong, Mir M, MD       valACYclovir (VALTREX) tablet 500 mg  500 mg Oral BID Hollice Gong, Mir M, MD   500 mg at 11/26/22 1034     Discharge Medications: Please see discharge summary for a list of discharge medications.  Relevant Imaging Results:  Relevant Lab Results:   Additional Information SS# [id number]  Angelita Ingles, RN

## 2022-11-26 NOTE — TOC Transition Note (Signed)
Transition of Care [facility]) - CM/SW Discharge Note   Patient Details  Name: SYNDA BUCCILLI MRN: [id number] Date of Birth: Feb 01, 1941  [facility]) CM/SW Contact:  Angelita Ingles, RN Phone [phone]    Clinical Narrative:    CM spoke with [place] at [facility] who confirms that patient is ok to return to rehab facility. MD made aware. CM at bedside to update patient and spouse. Family asking to CM to hold on for several minutes. CM has made nurse aware to update patient. Transportation has been arranged per PTAR. Discharge packet is at nurses station. No other needs noted at this time. TOC will sign off.    Final next level of care: [place] Barriers to Discharge: SNF Pending bed offer (From [place] living)   Patient Goals and CMS Choice      Discharge Placement                         Discharge Plan and Services Additional resources added to the After Visit Summary for       Post Acute Care Choice: [place]                               Social Determinants of Health (SDOH) Interventions SDOH Screenings   Food Insecurity: No Food Insecurity (11/22/2022)  Housing: [place]  (11/22/2022)  Transportation Needs: No Transportation Needs (11/22/2022)  Utilities: Not At Risk (11/22/2022)  Tobacco Use: Medium Risk (11/22/2022)     Readmission Risk Interventions    11/26/2022    1:47 PM  Readmission Risk Prevention Plan  Transportation Screening Complete  PCP or Specialist Appt within 5-7 Days Complete  Home Care Screening Complete  Medication Review (RN CM) Referral to Pharmacy

## 2022-11-26 NOTE — Evaluation (Signed)
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   She presented to the hospital 11/21/22  due to significant malaise and development of fever.  Clinical Impression  Pt admitted with above diagnosis.  Pt currently with functional limitations due to the deficits listed below (see PT Problem List). Pt will benefit from acute skilled PT to increase their independence and safety with mobility to allow discharge.     Patient did tolerate ambulation x 60' with RW , noted fatigue and had to return to bed rather quickly at  end of distance.  Patient reports that she will be going to [facility] re[facility] [place]. Patient will benefit from further PT at [place].   HR 90, SPO2 95% RA.        Recommendations for follow up therapy are one component of a multi-disciplinary discharge planning process, led by the attending physician.  Recommendations may be updated based on patient status, additional functional criteria and insurance authorization.  Follow Up Recommendations Skilled nursing-short term rehab (<3 hours/day) Can patient physically be transported by private vehicle: Yes    Assistance Recommended at Discharge Intermittent Supervision/Assistance  Patient can return home with the following  A little help with walking and/or transfers;A little help with bathing/dressing/bathroom;Assistance with cooking/housework;Assist for transportation;Help with stairs or ramp for entrance    Equipment Recommendations None recommended by PT  Recommendations for Other Services       Functional Status Assessment Patient has had a recent decline in their functional status and demonstrates the ability to make significant improvements in function in a reasonable and  predictable amount of time.     Precautions / Restrictions Precautions Precautions: Fall Precaution Comments: neutropenic  Significant bladder incontinence    Mobility  Bed Mobility Overal bed mobility: Needs Assistance Bed Mobility: Supine to Sit, Sit to Supine     Supine to sit: Min guard, HOB elevated Sit to supine: Min assist   General bed mobility comments: assist legs back into bed    Transfers Overall transfer level: Needs assistance Equipment used: Rolling walker (2 wheels) Transfers: Sit to/from Stand Sit to Stand: Min assist           General transfer comment: steady assistance to power up to stand    Ambulation/Gait Ambulation/Gait assistance: Min assist Gait Distance (Feet): 60 Feet Assistive device: Rolling walker (2 wheels) Gait Pattern/deviations: Step-through pattern Gait velocity: decr     General Gait Details: gait slow, stop for stand rest break with episode of weakness.  Stairs            Wheelchair Mobility    Modified Rankin (Stroke Patients Only)       Balance Overall balance assessment: Needs assistance Sitting-balance support: No upper extremity supported, Feet supported Sitting balance-Leahy Scale: Good     Standing balance support: During functional activity, Bilateral upper extremity supported, Reliant on assistive device for balance Standing balance-Leahy Scale: Poor                               Pertinent Vitals/Pain Pain Assessment Pain Assessment: Faces Faces Pain Scale: Hurts little more Pain Location: back Pain Descriptors / Indicators: Aching, Discomfort Pain Intervention(s): Monitored during session, Repositioned  Home Living Family/patient expects to be discharged to:: Private residence Living Arrangements: Spouse/significant other Available Help at Discharge: Family;Available 24 hours/day Type of Home: Independent living facility Home Access: Level entry       Home Layout: One  level Home Equipment: None      Prior Function Prior Level of Function : Independent/Modified Independent             Mobility Comments: did not use AD       Hand Dominance        Extremity/Trunk Assessment   Upper Extremity Assessment Upper Extremity Assessment: Overall WFL for tasks assessed (reports numbness in hands)    Lower Extremity Assessment Lower Extremity Assessment: Generalized weakness;LLE deficits/detail;RLE deficits/detail RLE Sensation: history of peripheral neuropathy LLE Sensation: history of peripheral neuropathy    Cervical / Trunk Assessment Cervical / Trunk Assessment: Normal  Communication   Communication: No difficulties  Cognition Arousal/Alertness: Awake/alert Behavior During Therapy: WFL for tasks assessed/performed Overall Cognitive Status: Within Functional Limits for tasks assessed                                          General Comments      Exercises     Assessment/Plan    PT Assessment Patient needs continued PT services  PT Problem List Decreased strength;Decreased mobility;Decreased knowledge of precautions;Decreased activity tolerance;Decreased balance;Impaired sensation;Pain       PT Treatment Interventions DME instruction;Therapeutic activities;Gait training;Therapeutic exercise;Patient/family education;Functional mobility training    PT Goals (Current goals can be found in the Care Plan section)  Acute Rehab PT Goals Patient Stated Goal: to go to rehab PT Goal Formulation: With patient Time For Goal Achievement: 12/10/22 Potential to Achieve Goals: Good    Frequency Min 3X/week     Co-evaluation               AM-PAC PT "6 Clicks" Mobility  Outcome Measure Help needed turning from your back to your side while in a flat bed without using bedrails?: None Help needed moving from lying on your back to sitting on the side of a flat bed without using bedrails?: None Help needed moving to  and from a bed to a chair (including a wheelchair)?: A Little Help needed standing up from a chair using your arms (e.g., wheelchair or bedside chair)?: A Little Help needed to walk in hospital room?: A Lot Help needed climbing 3-5 steps with a railing? : A Lot 6 Click Score: 18    End of Session Equipment Utilized During Treatment: Gait belt Activity Tolerance: Patient limited by fatigue Patient left: in bed;with call bell/phone within reach;with bed alarm set Nurse Communication: Mobility status PT Visit Diagnosis: Unsteadiness on feet (R26.81);Muscle weakness (generalized) (M62.81);Difficulty in walking, not elsewhere classified (R26.2);Other symptoms and signs involving the nervous system (R29.898)    Time: [id number] PT Time Calculation (min) (ACUTE ONLY): 21 min   Charges:   PT Evaluation $PT Eval Low Complexity: 1 Low          [place] Office [phone] Weekend [id number]   Claretha Cooper 11/26/2022, 11:29 AM

## 2022-11-27 LAB — CULTURE, BLOOD (ROUTINE X 2)
Culture: NO GROWTH
Culture: NO GROWTH

## 2022-11-29 ENCOUNTER — Non-Acute Institutional Stay (SKILLED_NURSING_FACILITY): Payer: Medicare Other | Admitting: Internal Medicine

## 2022-11-29 ENCOUNTER — Encounter: Payer: Self-pay | Admitting: Internal Medicine

## 2022-11-29 DIAGNOSIS — T8130XA Disruption of wound, unspecified, initial encounter: Secondary | ICD-10-CM

## 2022-11-29 DIAGNOSIS — C50411 Malignant neoplasm of upper-outer quadrant of right female breast: Secondary | ICD-10-CM | POA: Diagnosis not present

## 2022-11-29 DIAGNOSIS — R197 Diarrhea, unspecified: Secondary | ICD-10-CM

## 2022-11-29 DIAGNOSIS — E1142 Type 2 diabetes mellitus with diabetic polyneuropathy: Secondary | ICD-10-CM | POA: Diagnosis not present

## 2022-11-29 DIAGNOSIS — E1149 Type 2 diabetes mellitus with other diabetic neurological complication: Secondary | ICD-10-CM | POA: Diagnosis not present

## 2022-11-29 DIAGNOSIS — D709 Neutropenia, unspecified: Secondary | ICD-10-CM | POA: Diagnosis not present

## 2022-11-29 DIAGNOSIS — E785 Hyperlipidemia, unspecified: Secondary | ICD-10-CM | POA: Diagnosis not present

## 2022-11-29 DIAGNOSIS — Z170 Estrogen receptor positive status [ER+]: Secondary | ICD-10-CM

## 2022-11-29 NOTE — Progress Notes (Unsigned)
Provider:   Location:  Occupational psychologist of Service:  SNF (31)  PCP: [name], MD Patient Care Team: [name], MD as PCP - General (Internal Medicine) [name], MD as Consulting Physician (Internal Medicine) Nicholas Lose, MD as Consulting Physician (Hematology and Oncology) Mauro Kaufmann, RN as Oncology Nurse Navigator Rockwell Germany, RN as Oncology Nurse Navigator  Extended Emergency Contact Information Primary Emergency Contact: Elmon Kirschner of [place] Phone: [phone] Mobile Phone: [phone] Relation: Spouse Secondary Emergency Contact: [facility] Phone: [phone] Relation: Son Preferred language: English Interpreter needed? No  Code Status: Full Code Goals of Care: Advanced Directive information    11/29/2022    9:45 AM  Advanced Directives  Does Patient Have a Medical Advance Directive? Yes  Type of Advance Directive [place]  Does patient want to make changes to medical advance directive? No - Patient declined  Copy of [place] in Chart? No - copy requested      Chief Complaint  Patient presents with   New Admit To SNF    Patient is new Admit to SNF    HPI: Patient is a 82 y.o. 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 [place] Underwent Right Mastectomy and Port Placement on 10/14/22 First Chemo on 11/12/22 of CMF cycle Noticed Wound Dehiscence on 03/12  Admitted to the hospital with Fever and Possible Breast Cellulitis  Initially on Vancomycin and Rocephin Changed  to PO Doxy and Augmentin for 7 More days Surgery recommended Wet To dry dressing Her Insulin dose was reduced to 32 units and Sliding Scale Patient wants to restart her Novolog  insulin with Meals again  Had no other acute issues Feels Dizzy when stands up Chronic Issue Appetite coming back No Nausea Ddi c/o Loose stools since last night Has been to office many times No Fever or cramps Using walker now Husband at bedside    Past Medical History:  Diagnosis Date   Allergic rhinitis, seasonal    Arthritis    hands   Asthma    Years ago due to a cat. 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    GERD (gastroesophageal reflux disease)    History of benign neoplasm of tongue    per pt has had several bx's of lesions   History of cancer chemotherapy 1997   right breast cancer   History of external beam radiation therapy 1997   right breast cancer   History of iron deficiency anemia  History of non-ST elevation myocardial infarction (NSTEMI) 12/14/2008   History of right breast cancer 1997   s/p  right partial masectomy node dissection's,  completed chemo and radiation same year ,   no recurrence since   Hyperlipidemia    Mood disorder ([place])    Myocardial infarction ([place]) 2010   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   OAB (overactive bladder)    OSA (obstructive sleep apnea) 2016   sleep study in epic 10-04-2014, AHI 22.7/ hr;   (06-17-2022  pt stated has not used dental appliance in few years, does not fit )   Peripheral neuropathy    feet   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/2008   x2  to proximal and mid LAD   Urinary incontinence, mixed    urologist--- dr pace   Wears glasses    Wears hearing aid in both ears    Past Surgical History:  Procedure Laterality Date   BOTOX INJECTION N/A 06/22/2022   Procedure: ABORTED BOTOX INJECTION 75 UNITS;  Surgeon: Robley Fries, MD;  Location[facility];  Service: Urology;  Laterality: N/A;   BOTOX INJECTION N/A 07/20/2022   Procedure: BOTOX INJECTION;  Surgeon: Robley Fries, MD;  Location: [facility];  Service: Urology;  Laterality: N/A;   BREAST BIOPSY Right 08/27/2022   [place] RT BREAST BX W LOC DEV 1ST LESION IMG BX SPEC US GUIDE 08/27/2022 [facility] MAMMOGRAPHY   CATARACT EXTRACTION W/ INTRAOCULAR LENS IMPLANT Bilateral 2014   CORONARY ANGIOPLASTY WITH STENT PLACEMENT  12/16/2008   @[facility]   by dr berry;   PCI and stenting to proximal and mid LAD (DES) w/ residual D1 80%  nonobstructive disease involving RCA/ CFx,  ef 50%   CYSTOSCOPY N/A 06/22/2022   Procedure: CYSTOSCOPY, BLADDER BIOPSY, AND FULGERATION;  Surgeon: Robley Fries, MD;  Location: [place];  Service: Urology;  Laterality: N/A;  30 MINS   CYSTOSCOPY N/A 07/20/2022   Procedure: CYSTOSCOPY, BLADDER FULGERATION;  Surgeon: Robley Fries, MD;  Location: [facility];  Service: Urology;  Laterality: N/A;  30 MINS   EXCISION OF BREAST BIOPSY Right 1995   MASTECTOMY PARTIAL / LUMPECTOMY W/ AXILLARY LYMPHADENECTOMY Right 1997   PORTACATH PLACEMENT N/A 10/14/2022   Procedure: PORT PLACEMENT;  Surgeon: Stark Klein, MD;  Location: [place];  Service: General;  Laterality: N/A;   TOTAL MASTECTOMY Right 10/14/2022   Procedure: RIGHT MASTECTOMY;  Surgeon: Stark Klein, MD;  Location: [place];  Service: General;  Laterality: Right;  PEC BLOCK    TUBAL LIGATION Bilateral 1977    reports that she quit smoking about 33 years ago. Her smoking use included cigarettes. She has a 20.00 pack-year smoking history. She has never used smokeless tobacco. She reports current alcohol use of about 7.0 standard drinks of alcohol per week. She reports that she does not use drugs. Social History   Socioeconomic History   Marital status: Married    Spouse name: Not on file   Number of children: 1   Years of education: Assoc    Highest education level: Not on file  Occupational History    Comment: retired  Tobacco Use   Smoking status: Former    Packs/day: 1.00    Years: 20.00    Additional pack years: 0.00    Total pack years: 20.00    Types: Cigarettes    Quit date: 1991    Years since quitting: 68.2  Smokeless tobacco: Never  Vaping Use   Vaping Use: Never used  Substance and Sexual Activity   Alcohol use: Yes    Alcohol/week: 7.0 standard drinks of alcohol    Types: 7 Glasses of wine per week    Comment: Wine prior to dinner   Drug use: Never   Sexual activity: Not on file  Other Topics Concern   Not on file  Social History Narrative   3 caffeine drinks a day    Social Determinants of Health   Financial Resource Strain: Not on file  Food Insecurity: No Food Insecurity (11/22/2022)   Hunger Vital Sign    Worried About Running Out of Food in the Last Year: Never true    Ran Out of Food in the Last Year: Never true  Transportation Needs: No Transportation Needs (11/22/2022)   PRAPARE - Hydrologist (Medical): No    Lack of Transportation (Non-Medical): No  Physical Activity: Not on file  Stress: Not on file  Social Connections: Not on file  Intimate Partner Violence: Not At Risk (11/22/2022)   Humiliation, Afraid, Rape, and Kick questionnaire    Fear of Current or Ex-Partner: No    Emotionally Abused: No    Physically Abused: No    Sexually Abused: No    Functional Status Survey:     Family History  Problem Relation Age of Onset   Osteoporosis Mother    Breast cancer Mother 69   Heart Problems Mother    Other Father        MI   CVA Father    Asthma Father    Diabetes Mellitus II Father    Hypertension Sister    Ovarian cancer Sister 66    Health Maintenance  Topic Date Due   FOOT EXAM  Never done   OPHTHALMOLOGY EXAM  Never done   Diabetic kidney evaluation - Urine ACR  Never done   Hepatitis C Screening  Never done   Zoster Vaccines- Shingrix (1 of 2) Never done   DEXA SCAN  Never done   Medicare Annual Wellness (AWV)  02/13/2022   COVID-19 Vaccine (6 - 2023-24 season) 09/27/2022   HEMOGLOBIN A1C  04/08/2023   Diabetic kidney evaluation - eGFR measurement  11/26/2023   DTaP/Tdap/Td (2 - Td or Tdap) 12/26/2024   Pneumonia Vaccine 19+ Years old  Completed   INFLUENZA VACCINE  Completed   HPV VACCINES  Aged Out    Allergies  Allergen Reactions   Avandia [Rosiglitazone]     Per matrix   Evista [Raloxifene]    Lipitor [Atorvastatin]     Per matrix   Metformin And Related     Per nursing home MAtrix   Pravachol [Pravastatin]     Per nursing home matrix   Wellbutrin [Bupropion]     Per matrix   Azithromycin Rash   Demerol [Meperidine] Diarrhea and Nausea And Vomiting    Outpatient Encounter Medications as of 11/29/2022  Medication Sig   acetaminophen (TYLENOL) 500 MG tablet Take 2 tablets (1,000 mg total) by mouth every 6 (six) hours. (Patient taking differently: Take 1,000 mg by mouth 2 (two) times daily.)   amoxicillin-clavulanate (AUGMENTIN) 875-125 MG tablet Take 1 tablet by mouth 2 (two) times daily with a meal for 7 days.   aspirin 325 MG tablet Take 325 mg by mouth daily.   BD PEN NEEDLE NANO U/F 32G X 4 MM MISC SMARTSIG:2 Pen Needle SUB-Q Daily   Bempedoic Acid-Ezetimibe (  NEXLIZET) 180-10 MG TABS Take 1 tablet by mouth daily. (Patient taking differently: Take 1 tablet by mouth every other day.)   Cholecalciferol (VITAMIN D3) 25 MCG  (1000 UT) tablet Take 1,000 Units by mouth daily.   citalopram (CELEXA) 40 MG tablet Take 40 mg by mouth daily.   Continuous Blood Gluc Sensor (FREESTYLE LIBRE 14 DAY SENSOR) MISC Apply topically every 14 (fourteen) days.   Cranberry Soft 500 MG CHEW Chew 500 mg by mouth daily.   doxycycline (VIBRA-TABS) 100 MG tablet Take 1 tablet (100 mg total) by mouth every 12 (twelve) hours for 7 days.   fluocinonide gel (LIDEX) [id number] % Apply 1 Application topically daily as needed (irritation on tongue).   ibuprofen (ADVIL) 200 MG tablet Take 200 mg by mouth every 6 (six) hours as needed for moderate pain.   insulin aspart (NOVOLOG) 100 UNIT/ML injection Inject 0-15 Units into the skin 3 (three) times daily with meals. CBG < 70 treat for low blood sugar CBG 70 - 120: 0 units CBG 121 - 150: 2 units CBG 151 - 200: 3 units CBG 201 - 250: 5 units CBG 251 - 300: 8 units CBG 301 - 350: 11 units CBG 351 - 400: 15 units CBG > 400: call MD   insulin aspart (NOVOLOG) 100 UNIT/ML injection Inject 0-5 Units into the skin at bedtime. CBG < 70: treat for low blood sguar CBG 70 - 120: 0 units CBG 121 - 150: 0 units CBG 151 - 200: 0 units CBG 201 - 250: 2 units CBG 251 - 300: 3 units CBG 301 - 350: 4 units CBG 351 - 400: 5 units CBG > 400: call MD   lidocaine (XYLOCAINE) 2 % solution Use as directed 15 mLs in the mouth or throat every 6 (six) hours as needed for mouth pain.   lidocaine-prilocaine (EMLA) cream Apply to affected area once (Patient taking differently: Apply 1 Application topically as needed (port). Apply to affected area once)   metoprolol tartrate (LOPRESSOR) 25 MG tablet TAKE 1/2 TABLET TWICE DAILY   Multiple Vitamins-Minerals (PRESERVISION AREDS) CAPS Take 1 capsule by mouth in the morning and at bedtime.   nitroGLYCERIN (NITROSTAT) 0.4 MG SL tablet Place 0.4 mg under the tongue every 5 (five) minutes as needed for chest pain.   prochlorperazine (COMPAZINE) 10 MG tablet Take 1 tablet (10 mg total)  by mouth every 6 (six) hours as needed for nausea or vomiting.   ramipril (ALTACE) 2.5 MG capsule Take 2.5 mg by mouth daily.   Sodium Chloride-Sodium Bicarb (SODIUM BICARBONATE/SODIUM CHLORIDE) SOLN Swish and spit as needed for mouth pain   tiZANidine (ZANAFLEX) 2 MG tablet Take 1 tablet (2 mg total) by mouth every 8 (eight) hours as needed for muscle spasms.   TRESIBA FLEXTOUCH 200 UNIT/ML FlexTouch Pen Inject 32 Units into the skin daily after lunch. We've reduced your insulin dose, please follow up with your PCP outpatient and adjust this further as needed.   valACYclovir (VALTREX) 500 MG tablet Take 1 tablet (500 mg total) by mouth 2 (two) times daily.   magic mouthwash (lidocaine, diphenhydrAMINE, alum & mag hydroxide) suspension Swish and spit 5 mLs by mouth 4 (four) times daily as needed for mouth pain. (Patient not taking: Reported on 11/29/2022)   vitamin B-12 (CYANOCOBALAMIN) 1000 MCG tablet Take 1,000 mcg by mouth daily. (Patient not taking: Reported on 11/29/2022)   No facility-administered encounter medications on file as of 11/29/2022.    Review of Systems  Constitutional:  Positive for appetite change. Negative for activity change.  HENT: Negative.    Respiratory:  Negative for cough and shortness of breath.   Cardiovascular:  Negative for leg swelling.  Gastrointestinal:  Positive for diarrhea. Negative for constipation.  Genitourinary: Negative.   Musculoskeletal:  Negative for arthralgias, gait problem and myalgias.  Skin: Negative.   Neurological:  Positive for weakness. Negative for dizziness.  Psychiatric/Behavioral:  Negative for confusion, dysphoric mood and sleep disturbance.     Vitals:   11/29/22 0934  BP: 120/64  Pulse: 80  Resp: 14  Temp: 97.9 F (36.6 C)  TempSrc: Temporal  SpO2: 95%  Weight: 155 lb (70.3 kg)  Height: 5\' 7"  (1.702 m)   Body mass index is 24.28 kg/m. Physical Exam Vitals reviewed.  Constitutional:      Appearance: Normal appearance.   HENT:     Head: Normocephalic.     Nose: Nose normal.     Mouth/Throat:     Mouth: Mucous membranes are moist.     Pharynx: Oropharynx is clear.  Eyes:     Pupils: Pupils are equal, round, and reactive to light.  Cardiovascular:     Rate and Rhythm: Normal rate and regular rhythm.     Pulses: Normal pulses.     Heart sounds: Normal heart sounds. No murmur heard. Pulmonary:     Effort: Pulmonary effort is normal.     Breath sounds: Normal breath sounds.  Abdominal:     General: Abdomen is flat. Bowel sounds are normal.     Palpations: Abdomen is soft.  Musculoskeletal:        General: No swelling.     Cervical back: Neck supple.  Skin:    General: Skin is warm.  Neurological:     General: No focal deficit present.     Mental Status: She is alert and oriented to person, place, and time.  Psychiatric:        Mood and Affect: Mood normal.        Thought Content: Thought content normal.    Labs reviewed: Basic Metabolic Panel: Recent Labs    11/24/22 0623 11/25/22 0540 11/26/22 0655  NA 134* 137 135  K 2.8* 4.6 3.5  CL 102 106 103  CO2 22 22 21*  GLUCOSE 144* 162* 166*  BUN 14 16 16   CREATININE 0.76 0.71 0.75  CALCIUM 8.1* 8.8* 8.4*  MG 2.1 2.2 2.0   Liver Function Tests: Recent Labs    11/19/22 1446 11/22/22 0058 11/23/22 0701  AST 11* 14* 14*  ALT 17 16 15   ALKPHOS 34* 35* 31*  BILITOT 0.6 1.0 0.5  PROT 7.4 7.5 6.4*  ALBUMIN 4.3 4.0 2.9*   No results for input(s): "LIPASE", "AMYLASE" in the last 8760 hours. No results for input(s): "AMMONIA" in the last 8760 hours. CBC: Recent Labs    11/24/22 0623 11/25/22 0540 11/26/22 0655  WBC 1.0* 4.9 15.2*  NEUTROABS 0.2* 2.6 8.2*  HGB 9.7* 11.3* 12.0  HCT 28.6* 34.7* 36.0  MCV 94.1 96.9 95.5  PLT 168 223 261   Cardiac Enzymes: No results for input(s): "CKTOTAL", "CKMB", "CKMBINDEX", "TROPONINI" in the last 8760 hours. BNP: Invalid input(s): "POCBNP" Lab Results  Component Value Date   HGBA1C 6.2  (H) 10/08/2022    Lab Results  Component Value Date   VITAMINB12 501 12/16/2008   Lab Results  Component Value Date   FOLATE  12/16/2008    8.4 (NOTE)  Reference Ranges  Deficient:       0.4 - 3.3 ng/mL        Indeterminate:   3.4 - 5.4 ng/mL        Normal:              > 5.4 ng/mL   Lab Results  Component Value Date   IRON 60 12/16/2008   TIBC 475 (H) 12/16/2008   FERRITIN 45 12/16/2008    Imaging and Procedures obtained prior to SNF admission: CT ABDOMEN PELVIS W CONTRAST  Result Date: 11/22/2022 CLINICAL DATA:  Sepsis, weakness, emesis. EXAM: CT ABDOMEN AND PELVIS WITH CONTRAST TECHNIQUE: Multidetector CT imaging of the abdomen and pelvis was performed using the standard protocol following bolus administration of intravenous contrast. RADIATION DOSE REDUCTION: This exam was performed according to the departmental dose-optimization program which includes automated exposure control, adjustment of the mA and/or kV according to patient size and/or use of iterative reconstruction technique. CONTRAST:  131mL OMNIPAQUE IOHEXOL 300 MG/ML  SOLN COMPARISON:  None Available. FINDINGS: Lower chest: Coronary artery calcifications are noted. Atelectasis is noted at the lung bases. Hepatobiliary: A subcentimeter hypodensity is noted in the left lobe of the liver, statistically most likely cyst or hemangioma. Fatty infiltration of the liver is noted. No biliary ductal dilatation. Stones are present within the gallbladder. Pancreas: Unremarkable. No pancreatic ductal dilatation or surrounding inflammatory changes. Spleen: Normal in size without focal abnormality. Adrenals/Urinary Tract: No adrenal nodule or mass. The kidneys enhance symmetrically. A subcentimeter hypodensity is present in the right kidney which is too small to further characterize. A punctate renal calculus is noted on the left. No hydroureteronephrosis bilaterally. Mild bladder wall thickening is noted on the right lateral wall.  Stomach/Bowel: Stomach is within normal limits. Appendix is not seen. No evidence of bowel wall thickening, distention, or inflammatory changes. No free air or pneumatosis. Vascular/Lymphatic: Aortic atherosclerosis. No enlarged abdominal or pelvic lymph nodes. Reproductive: Uterus and bilateral adnexa are unremarkable. Other: No abdominopelvic ascites. Mastectomy changes are noted on the right with multiple foci of air in the subcutaneous soft tissues. No focal fluid collection is seen to suggest abscess. Musculoskeletal: Degenerative changes are present in the thoracolumbar spine. No acute osseous abnormality. IMPRESSION: 1. 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 No abscess is seen. 2. Mild bladder wall thickening, possible infectious or inflammatory cystitis. 3. Hepatic steatosis. 4. Cholelithiasis. 5. Nonobstructive left renal calculus. 6. Aortic atherosclerosis. Electronically Signed   By: Brett Fairy M.D.   On: 11/22/2022 02:46   DG Chest Port 1 View  Result Date: 11/22/2022 CLINICAL DATA:  [id number] Sepsis ([place]) [id number]. Breast cancer, recent chemotherapy. Nausea, vomiting, weakness EXAM: PORTABLE CHEST 1 VIEW COMPARISON:  10/14/2022 FINDINGS: Left Port-A-Cath remains in place, unchanged. Heart and mediastinal contours are within normal limits. No focal opacities or effusions. No acute bony abnormality. IMPRESSION: No active cardiopulmonary disease. Electronically Signed   By: Rolm Baptise M.D.   On: 11/22/2022 01:08    Assessment/Plan 1. Severe neutropenia ([place]) Now resolved It resolved in the hospital  After Chemo stopped  Will stay off Chemo for now till her wounds heal Oncology follow up  2. Wound dehiscence Wet to dry dressing Follow with Surgery On 2 Antibiotics for 7 days 3. Diarrhea, unspecified type Check Stool for C Diff Could be due to Antibiotics  4. Type 2 diabetes mellitus with other diabetic neurological complication  (HCC) Add Novolog 10 units with meals as she is very  concerned about her BS  She does have Insulin Monitor Will Keep monitoring for Hypoglycemia Her Long term insulin was also Reduced from 64 to 32  5. CAD Follows with Dr Gwendel Hanson ? Full dose of Aspirin 6. Malignant neoplasm of upper-outer quadrant of right breast in female, estrogen receptor positive ([place]) Plan for chemo After her wound heals She will Follow with Oncology 7. Hyperlipidemia, unspecified hyperlipidemia type On statin Follow with PCP 8 Depression On Celexa  Family/ staff Communication:   Labs/tests ordered:

## 2022-11-30 DIAGNOSIS — A4902 Methicillin resistant Staphylococcus aureus infection, unspecified site: Secondary | ICD-10-CM | POA: Diagnosis not present

## 2022-11-30 DIAGNOSIS — R2681 Unsteadiness on feet: Secondary | ICD-10-CM | POA: Diagnosis not present

## 2022-11-30 DIAGNOSIS — Z853 Personal history of malignant neoplasm of breast: Secondary | ICD-10-CM | POA: Diagnosis not present

## 2022-11-30 DIAGNOSIS — R2689 Other abnormalities of gait and mobility: Secondary | ICD-10-CM | POA: Diagnosis not present

## 2022-11-30 DIAGNOSIS — I10 Essential (primary) hypertension: Secondary | ICD-10-CM | POA: Diagnosis not present

## 2022-11-30 DIAGNOSIS — R1312 Dysphagia, oropharyngeal phase: Secondary | ICD-10-CM | POA: Diagnosis not present

## 2022-12-01 DIAGNOSIS — Z853 Personal history of malignant neoplasm of breast: Secondary | ICD-10-CM | POA: Diagnosis not present

## 2022-12-01 DIAGNOSIS — R2689 Other abnormalities of gait and mobility: Secondary | ICD-10-CM | POA: Diagnosis not present

## 2022-12-01 DIAGNOSIS — R2681 Unsteadiness on feet: Secondary | ICD-10-CM | POA: Diagnosis not present

## 2022-12-01 DIAGNOSIS — R1312 Dysphagia, oropharyngeal phase: Secondary | ICD-10-CM | POA: Diagnosis not present

## 2022-12-01 DIAGNOSIS — A4902 Methicillin resistant Staphylococcus aureus infection, unspecified site: Secondary | ICD-10-CM | POA: Diagnosis not present

## 2022-12-01 MED FILL — Fosaprepitant Dimeglumine For IV Infusion 150 MG (Base Eq): INTRAVENOUS | Qty: 5 | Status: AC

## 2022-12-01 MED FILL — Dexamethasone Sodium Phosphate Inj 100 MG/10ML: INTRAMUSCULAR | Qty: 1 | Status: AC

## 2022-12-02 ENCOUNTER — Inpatient Hospital Stay (HOSPITAL_BASED_OUTPATIENT_CLINIC_OR_DEPARTMENT_OTHER): Payer: Medicare Other | Admitting: Adult Health

## 2022-12-02 ENCOUNTER — Inpatient Hospital Stay: Payer: Medicare Other

## 2022-12-02 ENCOUNTER — Other Ambulatory Visit: Payer: Self-pay

## 2022-12-02 DIAGNOSIS — Z170 Estrogen receptor positive status [ER+]: Secondary | ICD-10-CM

## 2022-12-02 DIAGNOSIS — K1231 Oral mucositis (ulcerative) due to antineoplastic therapy: Secondary | ICD-10-CM | POA: Diagnosis not present

## 2022-12-02 DIAGNOSIS — R2681 Unsteadiness on feet: Secondary | ICD-10-CM | POA: Diagnosis not present

## 2022-12-02 DIAGNOSIS — Z803 Family history of malignant neoplasm of breast: Secondary | ICD-10-CM | POA: Diagnosis not present

## 2022-12-02 DIAGNOSIS — D701 Agranulocytosis secondary to cancer chemotherapy: Secondary | ICD-10-CM | POA: Diagnosis not present

## 2022-12-02 DIAGNOSIS — T451X5A Adverse effect of antineoplastic and immunosuppressive drugs, initial encounter: Secondary | ICD-10-CM | POA: Diagnosis not present

## 2022-12-02 DIAGNOSIS — Z9011 Acquired absence of right breast and nipple: Secondary | ICD-10-CM | POA: Diagnosis not present

## 2022-12-02 DIAGNOSIS — Z955 Presence of coronary angioplasty implant and graft: Secondary | ICD-10-CM | POA: Diagnosis not present

## 2022-12-02 DIAGNOSIS — R1312 Dysphagia, oropharyngeal phase: Secondary | ICD-10-CM | POA: Diagnosis not present

## 2022-12-02 DIAGNOSIS — R197 Diarrhea, unspecified: Secondary | ICD-10-CM | POA: Diagnosis not present

## 2022-12-02 DIAGNOSIS — G4733 Obstructive sleep apnea (adult) (pediatric): Secondary | ICD-10-CM | POA: Diagnosis not present

## 2022-12-02 DIAGNOSIS — Z9221 Personal history of antineoplastic chemotherapy: Secondary | ICD-10-CM | POA: Diagnosis not present

## 2022-12-02 DIAGNOSIS — I252 Old myocardial infarction: Secondary | ICD-10-CM | POA: Diagnosis not present

## 2022-12-02 DIAGNOSIS — R5383 Other fatigue: Secondary | ICD-10-CM | POA: Diagnosis not present

## 2022-12-02 DIAGNOSIS — R2689 Other abnormalities of gait and mobility: Secondary | ICD-10-CM | POA: Diagnosis not present

## 2022-12-02 DIAGNOSIS — A4902 Methicillin resistant Staphylococcus aureus infection, unspecified site: Secondary | ICD-10-CM | POA: Diagnosis not present

## 2022-12-02 DIAGNOSIS — Z87891 Personal history of nicotine dependence: Secondary | ICD-10-CM | POA: Diagnosis not present

## 2022-12-02 DIAGNOSIS — C50411 Malignant neoplasm of upper-outer quadrant of right female breast: Secondary | ICD-10-CM

## 2022-12-02 DIAGNOSIS — Z853 Personal history of malignant neoplasm of breast: Secondary | ICD-10-CM | POA: Diagnosis not present

## 2022-12-02 DIAGNOSIS — I2510 Atherosclerotic heart disease of native coronary artery without angina pectoris: Secondary | ICD-10-CM | POA: Diagnosis not present

## 2022-12-02 DIAGNOSIS — D6481 Anemia due to antineoplastic chemotherapy: Secondary | ICD-10-CM | POA: Diagnosis not present

## 2022-12-02 DIAGNOSIS — Z5111 Encounter for antineoplastic chemotherapy: Secondary | ICD-10-CM | POA: Diagnosis not present

## 2022-12-02 DIAGNOSIS — Z95828 Presence of other vascular implants and grafts: Secondary | ICD-10-CM

## 2022-12-02 DIAGNOSIS — Z8041 Family history of malignant neoplasm of ovary: Secondary | ICD-10-CM | POA: Diagnosis not present

## 2022-12-02 DIAGNOSIS — Z923 Personal history of irradiation: Secondary | ICD-10-CM | POA: Diagnosis not present

## 2022-12-02 LAB — CMP (CANCER CENTER ONLY)
ALT: 12 U/L (ref 0–44)
AST: 11 U/L — ABNORMAL LOW (ref 15–41)
Albumin: 3.7 g/dL (ref 3.5–5.0)
Alkaline Phosphatase: 60 U/L (ref 38–126)
Anion gap: 10 (ref 5–15)
BUN: 21 mg/dL (ref 8–23)
CO2: 27 mmol/L (ref 22–32)
Calcium: 9.1 mg/dL (ref 8.9–10.3)
Chloride: 102 mmol/L (ref 98–111)
Creatinine: 0.75 mg/dL (ref 0.44–1.00)
GFR, Estimated: >60 mL/min (ref >60)
Glucose, Bld: 137 mg/dL — ABNORMAL HIGH (ref 70–99)
Potassium: 3.7 mmol/L (ref 3.5–5.1)
Sodium: 139 mmol/L (ref 135–145)
Total Bilirubin: 0.3 mg/dL (ref 0.3–1.2)
Total Protein: 7 g/dL (ref 6.5–8.1)

## 2022-12-02 LAB — CBC WITH DIFFERENTIAL (CANCER CENTER ONLY)
Abs Immature Granulocytes: 0.17 10*3/uL — ABNORMAL HIGH (ref 0.00–0.07)
Basophils Absolute: 0.1 10*3/uL (ref 0.0–0.1)
Basophils Relative: 1 %
Eosinophils Absolute: 0.1 10*3/uL (ref 0.0–0.5)
Eosinophils Relative: 2 %
HCT: 31.3 % — ABNORMAL LOW (ref 36.0–46.0)
Hemoglobin: 10.7 g/dL — ABNORMAL LOW (ref 12.0–15.0)
Immature Granulocytes: 2 %
Lymphocytes Relative: 18 %
Lymphs Abs: 1.3 10*3/uL (ref 0.7–4.0)
MCH: 32 pg (ref 26.0–34.0)
MCHC: 34.2 g/dL (ref 30.0–36.0)
MCV: 93.7 fL (ref 80.0–100.0)
Monocytes Absolute: 0.9 10*3/uL (ref 0.1–1.0)
Monocytes Relative: 13 %
Neutro Abs: 4.6 10*3/uL (ref 1.7–7.7)
Neutrophils Relative %: 64 %
Platelet Count: 306 10*3/uL (ref 150–400)
RBC: 3.34 MIL/uL — ABNORMAL LOW (ref 3.87–5.11)
RDW: 15.3 % (ref 11.5–15.5)
WBC Count: 7.2 10*3/uL (ref 4.0–10.5)
nRBC: 0 % (ref 0.0–0.2)

## 2022-12-02 MED ORDER — SODIUM CHLORIDE 0.9% FLUSH
10.0000 mL | Freq: Once | INTRAVENOUS | Status: AC
  Administered 2022-12-02: 10 mL

## 2022-12-02 MED ORDER — HEPARIN SOD (PORK) LOCK FLUSH 100 UNIT/ML IV SOLN
500.0000 [IU] | Freq: Once | INTRAVENOUS | Status: AC
  Administered 2022-12-02: 500 [IU]

## 2022-12-02 NOTE — Progress Notes (Signed)
[facility] Follow up:    Karina Foster, [place] [address]   DIAGNOSIS:  Cancer Staging  No matching staging information was found for the patient.  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 female returns for f/u and evaluation today after being hospitalized for febrile neutropenia and breast cellulitis.    She was admitted to the hospital on 3/17 for febrile neutropenia and breast cellulitis.  She was treated with antibiotics and her breast is healing.  She has it dressed today.  She is taking Augmentin daily and tolerating it well.    In reviewing Dr. Geralyn Flash notes from her hospitalization, he was concerned about continuing chemotherapy, and wanted to wait until her breast cellulitis and incision completely healed before resuming it.     Patient Active Problem List   Diagnosis Date Noted   Neutropenic fever ([place]) 11/22/2022   UTI (urinary tract infection) 11/22/2022   Tachycardia 11/22/2022   AKI (acute kidney injury) ([place]) 11/22/2022   Hypokalemia 11/22/2022   Severe neutropenia ([place]) 11/22/2022   Port-A-Cath in place 11/19/2022   Breast cancer of upper-outer quadrant of right female breast ([place]) 10/14/2022   Hyperlipemia 10/14/2022   Malignant  neoplasm of upper-outer quadrant of right breast in female, estrogen receptor positive ([place]) 09/15/2022   History of MI (myocardial infarction) 09/13/2022   BPPV (benign paroxysmal positional vertigo) 05/01/2019   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[place]) [id number]   Diabetic peripheral neuropathy associated with type 2 diabetes mellitus ([place]) 09/19/2012   Essential hypertension 08/13/2009    is allergic to azithromycin and demerol [meperidine].  MEDICAL HISTORY: Past Medical History:  Diagnosis Date   Allergic rhinitis, seasonal    Arthritis    hands   Asthma    Years ago due to a cat. 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    GERD (gastroesophageal reflux disease)    History of benign neoplasm of tongue    per pt has had several bx's of lesions   History of cancer chemotherapy 1997   right breast cancer   History of external beam radiation therapy 1997   right breast cancer  History of iron deficiency anemia    History of non-ST elevation myocardial infarction (NSTEMI) 12/14/2008   History of right breast cancer 1997   s/p  right partial masectomy node dissection's,  completed chemo and radiation same year ,   no recurrence since   Hyperlipidemia    Mood disorder ([place])    Myocardial infarction ([place]) 2010   Neuropathy due to chemotherapeutic drug ([place])    hands   OAB (overactive bladder)    OSA (obstructive sleep  apnea) 2016   sleep study in epic 10-04-2014, AHI 22.7/ hr;  (06-17-2022  pt stated has not used dental appliance in few years, does not fit )   Peripheral neuropathy    feet   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/2008   x2  to proximal and mid LAD   Urinary incontinence, mixed    urologist--- dr pace   Wears glasses    Wears hearing aid in both ears     SURGICAL HISTORY: Past Surgical History:  Procedure Laterality Date   BOTOX INJECTION N/A 06/22/2022   Procedure: ABORTED BOTOX INJECTION 75 UNITS;  Surgeon: Robley Fries, MD;  Location: [facility];  Service: Urology;  Laterality: N/A;   BOTOX INJECTION N/A 07/20/2022   Procedure: BOTOX INJECTION;  Surgeon: Robley Fries, MD;  Location: [facility];  Service: Urology;  Laterality: N/A;   BREAST BIOPSY Right 08/27/2022   [place] RT BREAST BX W LOC DEV 1ST LESION IMG BX SPEC US GUIDE 08/27/2022 [facility]MO[facility]   CATARACT EXTRACTION W/ INTRAOCULAR LENS IMPLANT Bilateral 2014   CORONARY ANGIOPLASTY WITH STENT PLACEMENT  12/16/2008   @[facility]   by dr berry;   PCI and stenting to proximal and mid LAD (DES) w/ residual D1 80%  nonobstructive disease involving RCA/ CFx,  ef 50%   CYSTOSCOPY N/A 06/22/2022   Procedure: CYSTOSCOPY, BLADDER BIOPSY, AND FULGERATION;  Surgeon: Robley Fries, MD;  Location: [facility];  Service: Urology;  Laterality: N/A;  30 MINS   CYSTOSCOPY N/A 07/20/2022   Procedure: CYSTOSCOPY, BLADDER FULGERATION;  Surgeon: Robley Fries, MD;  Location: [facility];  Service: Urology;  Laterality: N/A;  30 MINS   EXCISION OF BREAST BIOPSY Right 1995   MASTECTOMY PARTIAL / LUMPECTOMY W/ AXILLARY LYMPHADENECTOMY Right 1997   PORTACATH PLACEMENT N/A 10/14/2022   Procedure: PORT PLACEMENT;  Surgeon: Stark Klein, MD;  Location: [facility];  Service: General;  Laterality: N/A;   TOTAL MASTECTOMY Right 10/14/2022   Procedure: RIGHT MASTECTOMY;  Surgeon:  Stark Klein, MD;  Location: [facility];  Service: General;  Laterality: Right;  PEC BLOCK   TUBAL LIGATION Bilateral 1977    SOCIAL HISTORY: Social History   Socioeconomic History   Marital status: Married    Spouse name: Not on file   Number of children: 1   Years of education: Assoc    Highest education level: Not on file  Occupational History    Comment: retired  Tobacco Use   Smoking status: Former    Packs/day: 1.00    Years: 20.00    Additional pack years: 0.00    Total pack years: 20.00    Types: Cigarettes    Quit date: 1991    Years since quitting: 33.2   Smokeless tobacco: Never  Vaping Use   Vaping Use: Never used  Substance and Sexual Activity   Alcohol use: Yes    Alcohol/week: 7.0 standard drinks of alcohol  Types: 7 Glasses of wine per week    Comment: Wine prior to dinner   Drug use: Never   Sexual activity: Not on file  Other Topics Concern   Not on file  Social History Narrative   3 caffeine drinks a day    Social Determinants of Health   Financial Resource Strain: Not on file  Food Insecurity: No Food Insecurity (11/22/2022)   Hunger Vital Sign    Worried About Running Out of Food in the Last Year: Never true    Ran Out of Food in the Last Year: Never true  Transportation Needs: No Transportation Needs (11/22/2022)   PRAPARE - Hydrologist (Medical): No    Lack of Transportation (Non-Medical): No  Physical Activity: Not on file  Stress: Not on file  Social Connections: Not on file  Intimate Partner Violence: Not At Risk (11/22/2022)   Humiliation, Afraid, Rape, and Kick questionnaire    Fear of Current or Ex-Partner: No    Emotionally Abused: No    Physically Abused: No    Sexually Abused: No    FAMILY HISTORY: Family History  Problem Relation Age of Onset   Osteoporosis Mother    Breast cancer Mother 39   Heart Problems Mother    Other Father        MI   CVA Father    Asthma Father    Diabetes Mellitus  II Father    Hypertension Sister    Ovarian cancer Sister 45    Review of Systems  Constitutional:  Negative for appetite change, chills, fatigue, fever and unexpected weight change.  HENT:   Negative for hearing loss, lump/mass and trouble swallowing.   Eyes:  Negative for eye problems and icterus.  Respiratory:  Negative for chest tightness, cough and shortness of breath.   Cardiovascular:  Negative for chest pain, leg swelling and palpitations.  Gastrointestinal:  Negative for abdominal distention, abdominal pain, constipation, diarrhea, nausea and vomiting.  Endocrine: Negative for hot flashes.  Genitourinary:  Negative for difficulty urinating.   Musculoskeletal:  Negative for arthralgias.  Skin:  Negative for itching and rash.  Neurological:  Negative for dizziness, extremity weakness, headaches and numbness.  Hematological:  Negative for adenopathy. Does not bruise/bleed easily.  Psychiatric/Behavioral:  Negative for depression. The patient is not nervous/anxious.       PHYSICAL EXAMINATION  ECOG PERFORMANCE STATUS: 1 - Symptomatic but completely ambulatory  Vitals:   12/02/22 1138  BP: (!) 136/49  Pulse: 84  Resp: 18  Temp: 97.9 F (36.6 C)  SpO2: 100%    Physical Exam Constitutional:      General: She is not in acute distress.    Appearance: Normal appearance. She is not toxic-appearing.  HENT:     Head: Normocephalic and atraumatic.  Pulmonary:     Effort: Pulmonary effort is normal.  Skin:    General: Skin is warm and dry.  Neurological:     General: No focal deficit present.     Mental Status: She is alert.  Psychiatric:        Mood and Affect: Mood normal.        Behavior: Behavior normal.     LABORATORY DATA:  CBC    Component Value Date/Time   WBC 7.2 12/02/2022 1102   WBC 15.2 (H) 11/26/2022 0655   RBC 3.34 (L) 12/02/2022 1102   HGB 10.7 (L) 12/02/2022 1102   HCT 31.3 (L) 12/02/2022 1102   PLT 306  12/02/2022 1102   MCV 93.7 12/02/2022  1102   MCH 32.0 12/02/2022 1102   MCHC 34.2 12/02/2022 1102   RDW 15.3 12/02/2022 1102   LYMPHSABS 1.3 12/02/2022 1102   MONOABS 0.9 12/02/2022 1102   EOSABS 0.1 12/02/2022 1102   BASOSABS 0.1 12/02/2022 1102    CMP     Component Value Date/Time   NA 139 12/02/2022 1102   K 3.7 12/02/2022 1102   CL 102 12/02/2022 1102   CO2 27 12/02/2022 1102   GLUCOSE 137 (H) 12/02/2022 1102   BUN 21 12/02/2022 1102   CREATININE 0.75 12/02/2022 1102   CALCIUM 9.1 12/02/2022 1102   PROT 7.0 12/02/2022 1102   ALBUMIN 3.7 12/02/2022 1102   AST 11 (L) 12/02/2022 1102   ALT 12 12/02/2022 1102   ALKPHOS 60 12/02/2022 1102   BILITOT 0.3 12/02/2022 1102   GFRNONAA >60 12/02/2022 1102   GFRAA  12/18/2008 0505    >60        The eGFR has been calculated using the MDRD equation. This calculation has not been validated in all clinical situations. eGFR's persistently <60 mL/min signify possible Chronic Kidney Disease.      ASSESSMENT and THERAPY PLAN:   Malignant neoplasm of upper-outer quadrant of right breast in female, estrogen receptor positive ([place]) Karina Foster is a 82 year old woman with h/o right sided  metaplastic breast cancer here today for f/u after recent hospitalization for breast cellulitis.   Treatment plan: Mastectomy adjuvant chemotherapy with CMF x 6 cycles  No role of antiestrogen therapy since her repeat prognostic panel was triple negative   Chemo toxicities: Fatigue-energy conservation Chemotherapy-induced anemia-monitoring Mucositis-resolved Febrile neutropenia and cellulitis at wound dehiscence site requiring hospitalization: counts have recovered, she is doing well on Augmentin, has f/u with Dr. Barry Dienes, wound is dressed. Holding future chemotherapy until wound heals.    Karina Foster's labs are improved today.  She will not receive treatment today.  She will continue to f/u with Dr. Barry Dienes about her surgical wound and will return in 3 weeks for labs, f/u and to discuss  chemotherapy.    All questions were answered. The patient knows to call the clinic with any problems, questions or concerns. We can certainly see the patient much sooner if necessary.  Total encounter time:20 minutes*in face-to-face visit time, chart review, lab review, care coordination, order entry, and documentation of the encounter time.    Wilber Bihari, NP 12/05/22 4:01 PM Medical Oncology and Hematology [facility] [place], [place] [address] Tel. [phone]    Fax. [phone]  *Total Encounter Time as defined by the Centers for Medicare and Medicaid Services includes, in addition to the face-to-face time of a patient visit (documented in the note above) non-face-to-face time: obtaining and reviewing outside history, ordering and reviewing medications, tests or procedures, care coordination (communications with other health care professionals or caregivers) and documentation in the medical record.

## 2022-12-03 DIAGNOSIS — R2689 Other abnormalities of gait and mobility: Secondary | ICD-10-CM | POA: Diagnosis not present

## 2022-12-03 DIAGNOSIS — R2681 Unsteadiness on feet: Secondary | ICD-10-CM | POA: Diagnosis not present

## 2022-12-03 DIAGNOSIS — Z853 Personal history of malignant neoplasm of breast: Secondary | ICD-10-CM | POA: Diagnosis not present

## 2022-12-03 DIAGNOSIS — A4902 Methicillin resistant Staphylococcus aureus infection, unspecified site: Secondary | ICD-10-CM | POA: Diagnosis not present

## 2022-12-03 DIAGNOSIS — R1312 Dysphagia, oropharyngeal phase: Secondary | ICD-10-CM | POA: Diagnosis not present

## 2022-12-04 DIAGNOSIS — R2681 Unsteadiness on feet: Secondary | ICD-10-CM | POA: Diagnosis not present

## 2022-12-04 DIAGNOSIS — R2689 Other abnormalities of gait and mobility: Secondary | ICD-10-CM | POA: Diagnosis not present

## 2022-12-04 DIAGNOSIS — A4902 Methicillin resistant Staphylococcus aureus infection, unspecified site: Secondary | ICD-10-CM | POA: Diagnosis not present

## 2022-12-04 DIAGNOSIS — Z853 Personal history of malignant neoplasm of breast: Secondary | ICD-10-CM | POA: Diagnosis not present

## 2022-12-04 DIAGNOSIS — R1312 Dysphagia, oropharyngeal phase: Secondary | ICD-10-CM | POA: Diagnosis not present

## 2022-12-05 ENCOUNTER — Encounter: Payer: Self-pay | Admitting: Pulmonary Disease

## 2022-12-05 ENCOUNTER — Encounter: Payer: Self-pay | Admitting: Hematology and Oncology

## 2022-12-05 NOTE — Assessment & Plan Note (Signed)
Karina Foster is a 82 year old woman with h/o right sided  metaplastic breast cancer here today for f/u after recent hospitalization for breast cellulitis.   Treatment plan: Mastectomy adjuvant chemotherapy with CMF x 6 cycles  No role of antiestrogen therapy since her repeat prognostic panel was triple negative   Chemo toxicities: Fatigue-energy conservation Chemotherapy-induced anemia-monitoring Mucositis-resolved Febrile neutropenia and cellulitis at wound dehiscence site requiring hospitalization: counts have recovered, she is doing well on Augmentin, has f/u with Dr. Barry Dienes, wound is dressed. Holding future chemotherapy until wound heals.    Karina Foster's labs are improved today.  She will not receive treatment today.  She will continue to f/u with Dr. Barry Dienes about her surgical wound and will return in 3 weeks for labs, f/u and to discuss chemotherapy.

## 2022-12-06 ENCOUNTER — Non-Acute Institutional Stay (SKILLED_NURSING_FACILITY): Payer: Medicare Other | Admitting: Internal Medicine

## 2022-12-06 ENCOUNTER — Telehealth: Payer: Self-pay | Admitting: Hematology and Oncology

## 2022-12-06 ENCOUNTER — Encounter: Payer: Self-pay | Admitting: Internal Medicine

## 2022-12-06 DIAGNOSIS — E1149 Type 2 diabetes mellitus with other diabetic neurological complication: Secondary | ICD-10-CM

## 2022-12-06 DIAGNOSIS — N3946 Mixed incontinence: Secondary | ICD-10-CM

## 2022-12-06 DIAGNOSIS — E785 Hyperlipidemia, unspecified: Secondary | ICD-10-CM | POA: Diagnosis not present

## 2022-12-06 DIAGNOSIS — F32A Depression, unspecified: Secondary | ICD-10-CM

## 2022-12-06 DIAGNOSIS — R2689 Other abnormalities of gait and mobility: Secondary | ICD-10-CM | POA: Diagnosis not present

## 2022-12-06 DIAGNOSIS — I2510 Atherosclerotic heart disease of native coronary artery without angina pectoris: Secondary | ICD-10-CM | POA: Diagnosis not present

## 2022-12-06 DIAGNOSIS — R197 Diarrhea, unspecified: Secondary | ICD-10-CM | POA: Diagnosis not present

## 2022-12-06 DIAGNOSIS — Z170 Estrogen receptor positive status [ER+]: Secondary | ICD-10-CM | POA: Diagnosis not present

## 2022-12-06 DIAGNOSIS — R1312 Dysphagia, oropharyngeal phase: Secondary | ICD-10-CM | POA: Diagnosis not present

## 2022-12-06 DIAGNOSIS — M6281 Muscle weakness (generalized): Secondary | ICD-10-CM | POA: Diagnosis not present

## 2022-12-06 DIAGNOSIS — T8130XA Disruption of wound, unspecified, initial encounter: Secondary | ICD-10-CM | POA: Diagnosis not present

## 2022-12-06 DIAGNOSIS — C50411 Malignant neoplasm of upper-outer quadrant of right female breast: Secondary | ICD-10-CM | POA: Diagnosis not present

## 2022-12-06 DIAGNOSIS — R2681 Unsteadiness on feet: Secondary | ICD-10-CM | POA: Diagnosis not present

## 2022-12-06 DIAGNOSIS — Z853 Personal history of malignant neoplasm of breast: Secondary | ICD-10-CM | POA: Diagnosis not present

## 2022-12-06 DIAGNOSIS — A4902 Methicillin resistant Staphylococcus aureus infection, unspecified site: Secondary | ICD-10-CM | POA: Diagnosis not present

## 2022-12-06 NOTE — Telephone Encounter (Signed)
Scheduled appointments per WQ. Left voicemail. 

## 2022-12-06 NOTE — Progress Notes (Signed)
Location: Occupational psychologist of Service:   SNF  Provider:   Code Status: Full Code Goals of Care:     12/06/2022    3:36 PM  Advanced Directives  Does Patient Have a Medical Advance Directive? Yes  Type of Paramedic of [place];Living will  Does patient want to make changes to medical advance directive? No - Patient declined  Copy of [place] in Chart? No - copy requested     Chief Complaint  Patient presents with   Acute Visit    Patient is being seen for discharge    Quality Metric Gaps    Discussed the need for foot and eye exam, hep c screening and AWV    HPI: Patient is a 82 y.o. 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 [place] Underwent Right Mastectomy and Port Placement on 10/14/22 First Chemo on 11/12/22 of CMF cycle Noticed Wound Dehiscence on 03/12   Admitted to the hospital with Fever and Possible Breast Cellulitis   Initially on Vancomycin and Rocephin Changed to PO Doxy and Augmentin for 7 More days Surgery recommended Wet To dry dressing  Today Her wound still had discharge Also Developed Redness around the wound with slight tenderness Had Low grade temp of 99 Wants to go home Independent in her ADLS Need help with her dressing  CBGS doing really well with 140-150 Past Medical History:  Diagnosis Date   Allergic rhinitis, seasonal    Arthritis    hands   Asthma    Years ago due to a cat. 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    GERD (gastroesophageal reflux disease)    History of benign neoplasm of tongue    per pt has had several bx's of lesions   History of cancer chemotherapy 1997   right breast cancer   History of external beam radiation therapy 1997   right breast cancer   History of iron deficiency anemia    History of non-ST elevation myocardial infarction (NSTEMI) 12/14/2008   History of right breast cancer 1997   s/p  right partial masectomy node dissection's,  completed chemo and radiation same year ,   no recurrence since   Hyperlipidemia    Mood disorder    Myocardial infarction 2010   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   OAB (overactive bladder)    OSA (obstructive sleep apnea) 2016   sleep study in epic 10-04-2014, AHI 22.7/ hr;  (06-17-2022  pt stated  has not used dental appliance in few years, does not fit )   Peripheral neuropathy    feet   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/2008   x2  to proximal and mid LAD   Urinary incontinence, mixed    urologist--- dr pace   Wears glasses    Wears hearing aid in both ears     Past Surgical History:  Procedure Laterality Date   BOTOX INJECTION N/A 06/22/2022   Procedure: ABORTED BOTOX INJECTION 75 UNITS;  Surgeon: Robley Fries, MD;  Location: [facility];  Service: Urology;  Laterality: N/A;   BOTOX INJECTION N/A 07/20/2022   Procedure: BOTOX INJECTION;  Surgeon: Robley Fries, MD;  Location: [facility];  Service: Urology;  Laterality:  N/A;   BREAST BIOPSY Right 08/27/2022   [place] RT BREAST BX W LOC DEV 1ST LESION IMG BX SPEC US GUIDE 08/27/2022 [facility] MAMMOGRAPHY   CATARACT EXTRACTION W/ INTRAOCULAR LENS IMPLANT Bilateral 2014   CORONARY ANGIOPLASTY WITH STENT PLACEMENT  12/16/2008   @[facility]   by dr berry;   PCI and stenting to proximal and mid LAD (DES) w/ residual D1 80%  nonobstructive disease involving RCA/ CFx,  ef 50%   CYSTOSCOPY N/A 06/22/2022   Procedure: CYSTOSCOPY, BLADDER BIOPSY, AND FULGERATION;  Surgeon: Robley Fries, MD;  Location: [place];  Service: Urology;  Laterality: N/A;  30 MINS   CYSTOSCOPY N/A 07/20/2022   Procedure: CYSTOSCOPY, BLADDER FULGERATION;  Surgeon: Robley Fries, MD;  Location: [facility];  Service: Urology;  Laterality: N/A;  [place] Right 1995   MASTECTOMY PARTIAL / LUMPECTOMY W/ AXILLARY LYMPHADENECTOMY Right 1997   PORTACATH PLACEMENT N/A 10/14/2022   Procedure: PORT PLACEMENT;  Surgeon: Stark Klein, MD;  Location: [place];  Service: General;  Laterality: N/A;   TOTAL MASTECTOMY Right 10/14/2022   Procedure: RIGHT MASTECTOMY;  Surgeon: Stark Klein, MD;  Location: [place];  Service: General;  Laterality: Right;  PEC BLOCK   TUBAL LIGATION Bilateral 1977    Allergies  Allergen Reactions   Azithromycin Rash   Demerol [Meperidine] Diarrhea and Nausea And Vomiting    Outpatient Encounter Medications as of 12/06/2022  Medication Sig   acetaminophen (TYLENOL) 500 MG tablet Take 2 tablets (1,000 mg total) by mouth every 6 (six) hours. (Patient taking differently: Take 1,000 mg by mouth 2 (two) times daily.)   aspirin 325 MG tablet Take 325 mg by mouth daily.   BD PEN NEEDLE NANO U/F 32G X 4 MM MISC SMARTSIG:2 Pen Needle SUB-Q Daily   Bempedoic Acid-Ezetimibe (NEXLIZET) 180-10 MG TABS Take 1 tablet by mouth daily. (Patient taking differently: Take 1 tablet by mouth every other day.)   Cholecalciferol (VITAMIN D3) 25 MCG (1000 UT)  tablet Take 1,000 Units by mouth daily.   citalopram (CELEXA) 40 MG tablet Take 40 mg by mouth daily.   Continuous Blood Gluc Sensor (FREESTYLE LIBRE 14 DAY SENSOR) MISC Apply topically every 14 (fourteen) days.   Cranberry Soft 500 MG CHEW Chew 500 mg by mouth daily.   fluocinonide gel (LIDEX) [id number] % Apply 1 Application topically daily as needed (irritation on tongue).   ibuprofen (ADVIL) 200 MG tablet Take 200 mg by mouth every 6 (six) hours as needed for moderate pain.   insulin aspart (NOVOLOG) 100 UNIT/ML injection Inject 0-15 Units into the skin 3 (three) times daily with meals. CBG < 70 treat for low blood sugar  CBG 70 - 120: 0 units CBG 121 - 150: 2 units CBG 151 - 200: 3 units CBG 201 - 250: 5 units CBG 251 - 300: 8 units CBG 301 - 350: 11 units CBG 351 - 400: 15 units CBG > 400: call MD   insulin aspart (NOVOLOG) 100 UNIT/ML injection Inject 0-5 Units into the skin at bedtime. CBG < 70: treat for low blood sguar CBG 70 - 120: 0 units CBG 121 - 150: 0 units CBG 151 - 200: 0 units CBG 201 - 250: 2 units CBG 251 - 300: 3 units CBG 301 - 350: 4 units CBG 351 - 400: 5 units CBG > 400: call MD   lidocaine (XYLOCAINE) 2 % solution Use as directed 15 mLs in the mouth or throat every 6 (six) hours as needed for mouth pain.   lidocaine-prilocaine (EMLA) cream Apply to affected area once (Patient taking differently: Apply 1 Application topically as needed (port). Apply to affected area once)   magic mouthwash (lidocaine, diphenhydrAMINE, alum & mag hydroxide) suspension Swish and spit 5 mLs by mouth 4 (four) times daily as needed for mouth pain.   metoprolol tartrate (LOPRESSOR) 25 MG tablet TAKE 1/2 TABLET TWICE DAILY   Multiple Vitamins-Minerals (PRESERVISION AREDS) CAPS Take 1 capsule by mouth in the morning and at bedtime.   nitroGLYCERIN (NITROSTAT) 0.4 MG SL tablet Place 0.4 mg under the tongue every 5 (five) minutes as needed for chest pain.   prochlorperazine (COMPAZINE) 10 MG  tablet Take 1 tablet (10 mg total) by mouth every 6 (six) hours as needed for nausea or vomiting.   ramipril (ALTACE) 2.5 MG capsule Take 2.5 mg by mouth daily.   Sodium Chloride-Sodium Bicarb (SODIUM BICARBONATE/SODIUM CHLORIDE) SOLN Swish and spit as needed for mouth pain   tiZANidine (ZANAFLEX) 2 MG tablet Take 1 tablet (2 mg total) by mouth every 8 (eight) hours as needed for muscle spasms.   TRESIBA FLEXTOUCH 200 UNIT/ML FlexTouch Pen Inject 32 Units into the skin daily after lunch. We've reduced your insulin dose, please follow up with your PCP outpatient and adjust this further as needed.   valACYclovir (VALTREX) 500 MG tablet Take 1 tablet (500 mg total) by mouth 2 (two) times daily.   vitamin B-12 (CYANOCOBALAMIN) 1000 MCG tablet Take 1,000 mcg by mouth daily.   No facility-administered encounter medications on file as of 12/06/2022.    Review of Systems:  Review of Systems  Health Maintenance  Topic Date Due   FOOT EXAM  Never done   OPHTHALMOLOGY EXAM  Never done   Diabetic kidney evaluation - Urine ACR  Never done   Hepatitis C Screening  Never done   Medicare Annual Wellness (AWV)  02/13/2022   COVID-19 Vaccine (6 - 2023-24 season) 12/15/2022 (Originally 09/27/2022)   Zoster Vaccines- Shingrix (1 of 2) 03/01/2023 (Originally 03/20/1960)   DEXA SCAN  11/29/2023 (Originally 03/20/2008)   INFLUENZA VACCINE  04/07/2023   HEMOGLOBIN A1C  04/08/2023   Diabetic kidney evaluation - eGFR measurement  12/02/2023   DTaP/Tdap/Td (2 - Td or Tdap) 12/26/2024   Pneumonia Vaccine 63+ Years old  Completed   HPV VACCINES  Aged Out    Physical Exam: Vitals:   12/06/22 1533  BP: (!) 156/74  Pulse: 97  Resp: 19  Temp: 99 F (37.2 C)  TempSrc: Temporal  SpO2: 96%  Weight: 150 lb 6.4 oz (68.2 kg)  Height: 5\' 7"  (1.702 m)   Body mass index is 23.56 kg/m. Physical Exam  Vitals reviewed.  Constitutional:      Appearance: Normal appearance.  HENT:     Head: Normocephalic.     Nose:  Nose normal.     Mouth/Throat:     Mouth: Mucous membranes are moist.     Pharynx: Oropharynx is clear.  Eyes:     Pupils: Pupils are equal, round, and reactive to light.  Cardiovascular:     Rate and Rhythm: Normal rate and regular rhythm.     Pulses: Normal pulses.     Heart sounds: Normal heart sounds. No murmur heard. Pulmonary:     Effort: Pulmonary effort is normal.     Breath sounds: Normal breath sounds.  Abdominal:     General: Abdomen is flat. Bowel sounds are normal.     Palpations: Abdomen is soft.  Musculoskeletal:        General: No swelling.     Cervical back: Neck supple.  Skin:    General: Skin is warm.     Comments: Wound in her Right Breast is 3x5 With Tunneling. But Clean base Has now redness and Warmth around the edges Green discharge noticed  Neurological:     General: No focal deficit present.     Mental Status: She is alert and oriented to person, place, and time.  Psychiatric:        Mood and Affect: Mood normal.        Thought Content: Thought content normal.     Labs reviewed: Basic Metabolic Panel: Recent Labs    11/24/22 0623 11/25/22 0540 11/26/22 0655 12/02/22 1102  NA 134* 137 135 139  K 2.8* 4.6 3.5 3.7  CL 102 106 103 102  CO2 22 22 21* 27  GLUCOSE 144* 162* 166* 137*  BUN 14 16 16 21   CREATININE 0.76 0.71 0.75 0.75  CALCIUM 8.1* 8.8* 8.4* 9.1  MG 2.1 2.2 2.0  --    Liver Function Tests: Recent Labs    11/22/22 0058 11/23/22 0701 12/02/22 1102  AST 14* 14* 11*  ALT 16 15 12   ALKPHOS 35* 31* 60  BILITOT 1.0 0.5 0.3  PROT 7.5 6.4* 7.0  ALBUMIN 4.0 2.9* 3.7   No results for input(s): "LIPASE", "AMYLASE" in the last 8760 hours. No results for input(s): "AMMONIA" in the last 8760 hours. CBC: Recent Labs    11/25/22 0540 11/26/22 0655 12/02/22 1102  WBC 4.9 15.2* 7.2  NEUTROABS 2.6 8.2* 4.6  HGB 11.3* 12.0 10.7*  HCT 34.7* 36.0 31.3*  MCV 96.9 95.5 93.7  PLT 223 261 306   Lipid Panel: No results for input(s):  "CHOL", "HDL", "LDLCALC", "TRIG", "CHOLHDL", "LDLDIRECT" in the last 8760 hours. Lab Results  Component Value Date   HGBA1C 6.2 (H) 10/08/2022    Procedures since last visit: CT ABDOMEN PELVIS W CONTRAST  Result Date: 11/22/2022 CLINICAL DATA:  Sepsis, weakness, emesis. EXAM: CT ABDOMEN AND PELVIS WITH CONTRAST TECHNIQUE: Multidetector CT imaging of the abdomen and pelvis was performed using the standard protocol following bolus administration of intravenous contrast. RADIATION DOSE REDUCTION: This exam was performed according to the departmental dose-optimization program which includes automated exposure control, adjustment of the mA and/or kV according to patient size and/or use of iterative reconstruction technique. CONTRAST:  174mL OMNIPAQUE IOHEXOL 300 MG/ML  SOLN COMPARISON:  None Available. FINDINGS: Lower chest: Coronary artery calcifications are noted. Atelectasis is noted at the lung bases. Hepatobiliary: A subcentimeter hypodensity is noted in the left lobe of the liver, statistically most likely cyst or  hemangioma. Fatty infiltration of the liver is noted. No biliary ductal dilatation. Stones are present within the gallbladder. Pancreas: Unremarkable. No pancreatic ductal dilatation or surrounding inflammatory changes. Spleen: Normal in size without focal abnormality. Adrenals/Urinary Tract: No adrenal nodule or mass. The kidneys enhance symmetrically. A subcentimeter hypodensity is present in the right kidney which is too small to further characterize. A punctate renal calculus is noted on the left. No hydroureteronephrosis bilaterally. Mild bladder wall thickening is noted on the right lateral wall. Stomach/Bowel: Stomach is within normal limits. Appendix is not seen. No evidence of bowel wall thickening, distention, or inflammatory changes. No free air or pneumatosis. Vascular/Lymphatic: Aortic atherosclerosis. No enlarged abdominal or pelvic lymph nodes. Reproductive: Uterus and bilateral  adnexa are unremarkable. Other: No abdominopelvic ascites. Mastectomy changes are noted on the right with multiple foci of air in the subcutaneous soft tissues. No focal fluid collection is seen to suggest abscess. Musculoskeletal: Degenerative changes are present in the thoracolumbar spine. No acute osseous abnormality. IMPRESSION: 1. 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 No abscess is seen. 2. Mild bladder wall thickening, possible infectious or inflammatory cystitis. 3. Hepatic steatosis. 4. Cholelithiasis. 5. Nonobstructive left renal calculus. 6. Aortic atherosclerosis. Electronically Signed   By: Brett Fairy M.D.   On: 11/22/2022 02:46   DG Chest Port 1 View  Result Date: 11/22/2022 CLINICAL DATA:  [id number] Sepsis ([place]) [id number]. Breast cancer, recent chemotherapy. Nausea, vomiting, weakness EXAM: PORTABLE CHEST 1 VIEW COMPARISON:  10/14/2022 FINDINGS: Left Port-A-Cath remains in place, unchanged. Heart and mediastinal contours are within normal limits. No focal opacities or effusions. No acute bony abnormality. IMPRESSION: No active cardiopulmonary disease. Electronically Signed   By: Rolm Baptise M.D.   On: 11/22/2022 01:08    Assessment/Plan 1. Wound dehiscence Patient developed wound discharge and redness around the wound today No Odor  Tender Will change the wet to dry to BID  She will come to clinic in [place] and get that from the nurse here Also make referral to Wound care Clinic for oversight Doxycyline 100 mg BID for 10 days Follow up with Surgery and her PCP 2. Diarrhea, unspecified type C Diff negative Resolved when Antibiotics stopped  3. Type 2 diabetes mellitus with other diabetic neurological complication She is on lower dose of her Lantus 32 units With Novolog 10 units with meals CBGS doing well  4. Malignant neoplasm of upper-outer quadrant of right breast in female, estrogen receptor positive Plan For Chemo  after her Wound heals  5. Hyperlipidemia, unspecified hyperlipidemia type On statin 6. 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 Depression, unspecified depression type Celexa  8. Coronary artery disease I On Full dose of Aspirin ? Reason Follows with Dr Gwendel Hanson   Labs/tests ordered:  * No order type specified * Next appt:  Visit date not found

## 2022-12-07 ENCOUNTER — Other Ambulatory Visit: Payer: Self-pay | Admitting: Internal Medicine

## 2022-12-07 ENCOUNTER — Other Ambulatory Visit: Payer: Self-pay

## 2022-12-07 MED ORDER — DOXYCYCLINE MONOHYDRATE 100 MG PO CAPS: 100.0000 mg | ORAL_CAPSULE | Freq: Two times a day (BID) | ORAL | 0 refills | Status: AC

## 2022-12-08 DIAGNOSIS — A4902 Methicillin resistant Staphylococcus aureus infection, unspecified site: Secondary | ICD-10-CM | POA: Diagnosis not present

## 2022-12-08 DIAGNOSIS — R1312 Dysphagia, oropharyngeal phase: Secondary | ICD-10-CM | POA: Diagnosis not present

## 2022-12-08 DIAGNOSIS — M6281 Muscle weakness (generalized): Secondary | ICD-10-CM | POA: Diagnosis not present

## 2022-12-08 DIAGNOSIS — R2681 Unsteadiness on feet: Secondary | ICD-10-CM | POA: Diagnosis not present

## 2022-12-08 DIAGNOSIS — Z853 Personal history of malignant neoplasm of breast: Secondary | ICD-10-CM | POA: Diagnosis not present

## 2022-12-08 DIAGNOSIS — R2689 Other abnormalities of gait and mobility: Secondary | ICD-10-CM | POA: Diagnosis not present

## 2022-12-09 DIAGNOSIS — R2689 Other abnormalities of gait and mobility: Secondary | ICD-10-CM | POA: Diagnosis not present

## 2022-12-09 DIAGNOSIS — Z853 Personal history of malignant neoplasm of breast: Secondary | ICD-10-CM | POA: Diagnosis not present

## 2022-12-09 DIAGNOSIS — R1312 Dysphagia, oropharyngeal phase: Secondary | ICD-10-CM | POA: Diagnosis not present

## 2022-12-09 DIAGNOSIS — R2681 Unsteadiness on feet: Secondary | ICD-10-CM | POA: Diagnosis not present

## 2022-12-09 DIAGNOSIS — M6281 Muscle weakness (generalized): Secondary | ICD-10-CM | POA: Diagnosis not present

## 2022-12-09 DIAGNOSIS — A4902 Methicillin resistant Staphylococcus aureus infection, unspecified site: Secondary | ICD-10-CM | POA: Diagnosis not present

## 2022-12-15 DIAGNOSIS — Z853 Personal history of malignant neoplasm of breast: Secondary | ICD-10-CM | POA: Diagnosis not present

## 2022-12-15 DIAGNOSIS — R2681 Unsteadiness on feet: Secondary | ICD-10-CM | POA: Diagnosis not present

## 2022-12-15 DIAGNOSIS — R2689 Other abnormalities of gait and mobility: Secondary | ICD-10-CM | POA: Diagnosis not present

## 2022-12-15 DIAGNOSIS — A4902 Methicillin resistant Staphylococcus aureus infection, unspecified site: Secondary | ICD-10-CM | POA: Diagnosis not present

## 2022-12-15 DIAGNOSIS — R1312 Dysphagia, oropharyngeal phase: Secondary | ICD-10-CM | POA: Diagnosis not present

## 2022-12-15 DIAGNOSIS — M6281 Muscle weakness (generalized): Secondary | ICD-10-CM | POA: Diagnosis not present

## 2022-12-21 ENCOUNTER — Other Ambulatory Visit: Payer: Self-pay

## 2022-12-21 DIAGNOSIS — A4902 Methicillin resistant Staphylococcus aureus infection, unspecified site: Secondary | ICD-10-CM | POA: Diagnosis not present

## 2022-12-21 DIAGNOSIS — R2689 Other abnormalities of gait and mobility: Secondary | ICD-10-CM | POA: Diagnosis not present

## 2022-12-21 DIAGNOSIS — R2681 Unsteadiness on feet: Secondary | ICD-10-CM | POA: Diagnosis not present

## 2022-12-21 DIAGNOSIS — Z853 Personal history of malignant neoplasm of breast: Secondary | ICD-10-CM | POA: Diagnosis not present

## 2022-12-21 DIAGNOSIS — M6281 Muscle weakness (generalized): Secondary | ICD-10-CM | POA: Diagnosis not present

## 2022-12-21 DIAGNOSIS — R1312 Dysphagia, oropharyngeal phase: Secondary | ICD-10-CM | POA: Diagnosis not present

## 2022-12-21 NOTE — Progress Notes (Signed)
Patient Care Team: Chilton Greathouse, MD as PCP - General (Internal Medicine) Chilton Greathouse, MD as Consulting Physician (Internal Medicine) Serena Croissant, MD as Consulting Physician (Hematology and Oncology) Pershing Proud, RN as Oncology Nurse Navigator Donnelly Angelica, RN as Oncology Nurse Navigator  DIAGNOSIS:  Encounter Diagnosis  Name Primary?   Malignant neoplasm of upper-outer quadrant of right breast in female, estrogen receptor positive Yes    SUMMARY OF ONCOLOGIC HISTORY: Oncology History  Malignant neoplasm of upper-outer quadrant of right breast in female, estrogen receptor positive  08/27/2022 Initial Diagnosis   Screening mammogram detected right breast mass by ultrasound measured 2.3 cm biopsy revealed grade 3 IDC ER 50%, PR 0%, Ki-67 90%, HER2 1+ negative   10/14/2022 Surgery   Right mastectomy: High-grade IDC 3.4 cm with focal sarcomatoid features (metaplastic) 3.4 cm grade 3 with DCIS, lymphovascular invasion present, focal perineural invasion present, margins negative ER 50%, PR 0%, HER2 1+   11/12/2022 -  Chemotherapy   Patient is on Treatment Plan : BREAST Adjuvant CMF IV q21d       CHIEF COMPLIANT:  Follow-up Cycle 2 CMF   INTERVAL HISTORY: Karina PICHETTE is a 82 y.o. female is here because of recent diagnosis of right breast cancer. She presents to the clinic for a follow-up. She reports that she ended up in the hospital after last treatment. She says she feels better, and she has a therapist that comes to the house.   ALLERGIES:  is allergic to azithromycin and demerol [meperidine].  MEDICATIONS:  Current Outpatient Medications  Medication Sig Dispense Refill   acetaminophen (TYLENOL) 500 MG tablet Take 2 tablets (1,000 mg total) by mouth every 6 (six) hours. (Patient taking differently: Take 1,000 mg by mouth 2 (two) times daily.) 30 tablet 0   aspirin 325 MG tablet Take 325 mg by mouth daily.     BD PEN NEEDLE NANO U/F 32G X 4 MM MISC SMARTSIG:2  Pen Needle SUB-Q Daily     Bempedoic Acid-Ezetimibe (NEXLIZET) 180-10 MG TABS Take 1 tablet by mouth daily. (Patient taking differently: Take 1 tablet by mouth every other day.) 90 tablet 3   Cholecalciferol (VITAMIN D3) 25 MCG (1000 UT) tablet Take 1,000 Units by mouth daily.     citalopram (CELEXA) 40 MG tablet Take 40 mg by mouth daily.     Continuous Blood Gluc Sensor (FREESTYLE LIBRE 14 DAY SENSOR) MISC Apply topically every 14 (fourteen) days.     Cranberry Soft 500 MG CHEW Chew 500 mg by mouth daily.     fluocinonide gel (LIDEX) 0.05 % Apply 1 Application topically daily as needed (irritation on tongue).     ibuprofen (ADVIL) 200 MG tablet Take 200 mg by mouth every 6 (six) hours as needed for moderate pain.     Insulin Aspart FlexPen (NOVOLOG) 100 UNIT/ML 30 Units.     lidocaine (XYLOCAINE) 2 % solution Use as directed 15 mLs in the mouth or throat every 6 (six) hours as needed for mouth pain.     lidocaine-prilocaine (EMLA) cream Apply to affected area once (Patient taking differently: Apply 1 Application topically as needed (port). Apply to affected area once) 30 g 3   magic mouthwash (lidocaine, diphenhydrAMINE, alum & mag hydroxide) suspension Swish and spit 5 mLs by mouth 4 (four) times daily as needed for mouth pain. 240 mL 1   metoprolol tartrate (LOPRESSOR) 25 MG tablet TAKE 1/2 TABLET TWICE DAILY 180 tablet 3   Multiple Vitamins-Minerals (PRESERVISION AREDS) CAPS  Take 1 capsule by mouth in the morning and at bedtime.     nitroGLYCERIN (NITROSTAT) 0.4 MG SL tablet Place 0.4 mg under the tongue every 5 (five) minutes as needed for chest pain.     ramipril (ALTACE) 2.5 MG capsule Take 2.5 mg by mouth daily.     Sodium Chloride-Sodium Bicarb (SODIUM BICARBONATE/SODIUM CHLORIDE) SOLN Swish and spit as needed for mouth pain     TRESIBA FLEXTOUCH 200 UNIT/ML FlexTouch Pen Inject 32 Units into the skin daily after lunch. We've reduced your insulin dose, please follow up with your PCP  outpatient and adjust this further as needed. (Patient taking differently: Inject 64 Units into the skin daily after lunch. We've reduced your insulin dose, please follow up with your PCP outpatient and adjust this further as needed.)     valACYclovir (VALTREX) 500 MG tablet Take 1 tablet (500 mg total) by mouth 2 (two) times daily. 60 tablet 0   vitamin B-12 (CYANOCOBALAMIN) 1000 MCG tablet Take 1,000 mcg by mouth daily.     tiZANidine (ZANAFLEX) 2 MG tablet Take 1 tablet (2 mg total) by mouth every 8 (eight) hours as needed for muscle spasms. (Patient not taking: Reported on 12/23/2022) 20 tablet 1   No current facility-administered medications for this visit.    PHYSICAL EXAMINATION: ECOG PERFORMANCE STATUS: 2 - Symptomatic, <50% confined to bed  Vitals:   12/24/22 1009  BP: (!) 137/59  Pulse: 83  Resp: 18  Temp: 97.6 F (36.4 C)  SpO2: 99%   Filed Weights   12/24/22 1009  Weight: 151 lb 11.2 oz (68.8 kg)      LABORATORY DATA:  I have reviewed the data as listed    Latest Ref Rng & Units 12/02/2022   11:02 AM 11/26/2022    6:55 AM 11/25/2022    5:40 AM  CMP  Glucose 70 - 99 mg/dL 161  096  045   BUN 8 - 23 mg/dL Creatinine 0.44 - 1.00 mg/dL 4.09  8.11  9.14   Sodium 135 - 145 mmol/L 139  135  137   Potassium 3.5 - 5.1 mmol/L 3.7  3.5  4.6   Chloride 98 - 111 mmol/L 102  103  106   CO2 22 - 32 mmol/L Calcium 8.9 - 10.3 mg/dL 9.1  8.4  8.8   Total Protein 6.5 - 8.1 g/dL 7.0     Total Bilirubin 0.3 - 1.2 mg/dL 0.3     Alkaline Phos 38 - 126 U/L 60     AST 15 - 41 U/L 11     ALT 0 - 44 U/L 12       Lab Results  Component Value Date   WBC 6.4 12/24/2022   HGB 11.1 (L) 12/24/2022   HCT 33.3 (L) 12/24/2022   MCV 95.1 12/24/2022   PLT 304 12/24/2022   NEUTROABS 4.0 12/24/2022    ASSESSMENT & PLAN:  Malignant neoplasm of upper-outer quadrant of right breast in female, estrogen receptor positive (HCC) 10/14/2022:Right mastectomy: High-grade  IDC 3.4 cm with focal sarcomatoid features (metaplastic) 3.4 cm grade 3 with DCIS, lymphovascular invasion present, focal perineural invasion present, margins negative ER 0%, PR 0%, HER2 1+ (repeat prognostic panel was triple negative)   Treatment plan: adjuvant chemotherapy with CMF x 6 cycles to start 11/12/2022 2. no role of antiestrogen therapy since her repeat prognostic panel was triple negative -------------------------------------------------------------------------------------------------------------------------------------------- Current treatment: Cycle  2 CMF (on hold for recovery from recent wound infection) Chemo toxicities: Hospitalization 11/21/2022-11/26/2022: Cellulitis with neutropenic fever: Following with wound care team completed antibiotics 2 days ago. Mild source: Currently on Valtrex  Based upon the wound situation, we decided to hold off on doing chemotherapy today and we will postpone it for 3 weeks.   Return to clinic in 3 weeks for cycle 2    Orders Placed This Encounter  Procedures   CBC with Differential (Cancer Center Only)    Standing Status:   Future    Standing Expiration Date:   01/14/2024   CMP (Cancer Center only)    Standing Status:   Future    Standing Expiration Date:   01/14/2024   The patient has a good understanding of the overall plan. she agrees with it. she will call with any problems that may develop before the next visit here. Total time spent: 30 mins including face to face time and time spent for planning, charting and co-ordination of care   Tamsen Meek, MD 12/24/22    I Janan Ridge am acting as a Neurosurgeon for [employer]  I have reviewed the above documentation for accuracy and completeness, and I agree with the above.

## 2022-12-22 DIAGNOSIS — Z85828 Personal history of other malignant neoplasm of skin: Secondary | ICD-10-CM | POA: Diagnosis not present

## 2022-12-22 DIAGNOSIS — L720 Epidermal cyst: Secondary | ICD-10-CM | POA: Diagnosis not present

## 2022-12-22 DIAGNOSIS — L821 Other seborrheic keratosis: Secondary | ICD-10-CM | POA: Diagnosis not present

## 2022-12-23 ENCOUNTER — Ambulatory Visit: Payer: Medicare Other | Admitting: Cardiology

## 2022-12-23 ENCOUNTER — Encounter: Payer: Self-pay | Admitting: Cardiology

## 2022-12-23 VITALS — BP 135/76 | HR 92 | Resp 16 | Ht 67.0 in | Wt 152.4 lb

## 2022-12-23 DIAGNOSIS — I10 Essential (primary) hypertension: Secondary | ICD-10-CM | POA: Diagnosis not present

## 2022-12-23 DIAGNOSIS — E7800 Pure hypercholesterolemia, unspecified: Secondary | ICD-10-CM | POA: Diagnosis not present

## 2022-12-23 DIAGNOSIS — I2510 Atherosclerotic heart disease of native coronary artery without angina pectoris: Secondary | ICD-10-CM

## 2022-12-23 MED FILL — Dexamethasone Sodium Phosphate Inj 100 MG/10ML: INTRAMUSCULAR | Qty: 1 | Status: AC

## 2022-12-23 MED FILL — Fosaprepitant Dimeglumine For IV Infusion 150 MG (Base Eq): INTRAVENOUS | Qty: 5 | Status: AC

## 2022-12-23 NOTE — Progress Notes (Signed)
Primary Physician/Referring:  Chilton Greathouse, MD  Patient ID: Karina Foster, female    DOB: 1940-09-24, 82 y.o.   MRN: [id number]  Chief Complaint  Patient presents with   Hyperlipidemia   Coronary Artery Disease   Follow-up    3 months   HPI:    Karina Foster  is a 82 y.o.   Caucasian female with hypertension, hyperlipidemia, uncontrolled diabetes mellitus and coronary artery disease and angioplasty and stenting to the proximal and mid LAD in 2010 has a residual 80% large D1 stenosis, statin intolerant and presently on Nexlizet.  She had recurrence of right breast cancer, underwent mastectomy on 11/01/2022, unfortunately had postprocedural complication including sepsis and wound dehiscence.  She is being closely followed by surgery.  This is 63-month office visit.   She denies CP, dyspnea, orthopnea, PND, or symptoms suggestive of TIA or claudication.   Past Medical History:  Diagnosis Date   Allergic rhinitis, seasonal    Arthritis    hands   Asthma    Years ago due to a cat. 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    GERD (gastroesophageal reflux disease)    History of benign neoplasm of tongue    per pt has had several bx's of lesions   History of cancer chemotherapy 1997   right breast cancer   History of external beam radiation therapy 1997   right breast cancer   History of iron deficiency anemia    History of non-ST elevation myocardial infarction  (NSTEMI) 12/14/2008   History of right breast cancer 1997   s/p  right partial masectomy node dissection's,  completed chemo and radiation same year ,   no recurrence since   Hyperlipidemia    Mood disorder    Myocardial infarction 2010   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   OAB (overactive bladder)    OSA (obstructive sleep apnea) 2016   sleep study in epic 10-04-2014, AHI 22.7/ hr;  (06-17-2022  pt stated has not used dental appliance in few years, does not fit )   Peripheral neuropathy    feet   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/2008   x2  to proximal and mid LAD   Urinary incontinence, mixed    urologist--- dr pace   Wears glasses    Wears hearing aid in both ears    Past Surgical History:  Procedure Laterality Date   BOTOX INJECTION N/A 06/22/2022   Procedure: ABORTED BOTOX INJECTION 75 UNITS;  Surgeon: Noel Christmas, MD;  Location: [facility];  Service: Urology;  Laterality: N/A;   BOTOX INJECTION N/A 07/20/2022   Procedure: BOTOX INJECTION;  Surgeon: Noel Christmas, MD;  Location: [facility];  Service: Urology;  Laterality: N/A;   BREAST BIOPSY Right 08/27/2022   [place] RT BREAST BX W LOC DEV 1ST LESION IMG  BX SPEC US GUIDE 08/27/2022 [facility]   CATARACT EXTRACTION W/ INTRAOCULAR LENS IMPLANT Bilateral 2014   CORONARY ANGIOPLASTY WITH STENT PLACEMENT  12/16/2008   @[facility]   by dr berry;   PCI and stenting to proximal and mid LAD (DES) w/ residual D1 80%  nonobstructive disease involving RCA/ CFx,  ef 50%   CYSTOSCOPY N/A 06/22/2022   Procedure: CYSTOSCOPY, BLADDER BIOPSY, AND FULGERATION;  Surgeon: Noel Christmas, MD;  Location: [facility];  Service: Urology;  Laterality: N/A;  30 MINS   CYSTOSCOPY N/A 07/20/2022   Procedure: CYSTOSCOPY, BLADDER FULGERATION;  Surgeon: Noel Christmas, MD;  Location: [facility];  Service: Urology;  Laterality: N/A;  30 MINS   EXCISION OF  BREAST BIOPSY Right 1995   MASTECTOMY PARTIAL / LUMPECTOMY W/ AXILLARY LYMPHADENECTOMY Right 1997   PORTACATH PLACEMENT N/A 10/14/2022   Procedure: PORT PLACEMENT;  Surgeon: Almond Lint, MD;  Location: [facility];  Service: General;  Laterality: N/A;   TOTAL MASTECTOMY Right 10/14/2022   Procedure: RIGHT MASTECTOMY;  Surgeon: Almond Lint, MD;  Location: [facility];  Service: General;  Laterality: Right;  PEC BLOCK   TUBAL LIGATION Bilateral 1977   Social History   Tobacco Use   Smoking status: Former    Packs/day: 1.00    Years: 20.00    Additional pack years: 0.00    Total pack years: 20.00    Types: Cigarettes    Quit date: 1991    Years since quitting: 33.3   Smokeless tobacco: Never  Substance Use Topics   Alcohol use: Yes    Alcohol/week: 7.0 standard drinks of alcohol    Types: 7 Glasses of wine per week    Comment: Wine prior to dinner  Marital Status: Married    ROS  Review of Systems  Cardiovascular:  Negative for dyspnea on exertion and leg swelling.  Gastrointestinal:  Negative for melena.  Genitourinary:  Positive for bladder incontinence and frequency.   Objective  Blood pressure 135/76, pulse 92, resp. rate 16, height 5\' 7"  (1.702 m), weight 152 lb 6.4 oz (69.1 kg), SpO2 96 %.     12/23/2022    1:08 PM 12/06/2022    3:33 PM 12/02/2022   11:38 AM  Vitals with BMI  Height 5\' 7"  5\' 7"  5\' 7"   Weight 152 lbs 6 oz 150 lbs 6 oz 154 lbs  BMI 23.86 23.55 24.11  Systolic 135 156 161  Diastolic 76 74 49  Pulse 92 97 84    Physical Exam Neck:     Vascular: No carotid bruit or JVD.  Cardiovascular:     Rate and Rhythm: Normal rate and regular rhythm.     Pulses: Intact distal pulses.     Heart sounds: Normal heart sounds. No murmur heard.    No gallop.  Pulmonary:     Effort: Pulmonary effort is normal.     Breath sounds: Normal breath sounds.  Abdominal:     General: Bowel sounds are normal.     Palpations: Abdomen is soft.  Musculoskeletal:     Right lower leg: No  edema.     Left lower leg: No edema.    Laboratory examination:   Lab Results  Component Value Date   NA 139 12/02/2022   K 3.7 12/02/2022   CO2 27 12/02/2022   GLUCOSE 137 (H) 12/02/2022   BUN 21 12/02/2022   CREATININE 0.75 12/02/2022   CALCIUM 9.1 12/02/2022   GFRNONAA >60 12/02/2022  Lab Results  Component Value Date   WBC 7.2 12/02/2022   HGB 10.7 (L) 12/02/2022   HCT 31.3 (L) 12/02/2022   MCV 93.7 12/02/2022   PLT 306 12/02/2022   External labs:  Labs 02/23/2022:  A1c 6.1%.  TSH mildly elevated at 4.64.  Total cholesterol 258, triglycerides 123, HDL 88, LDL 145.  Non-HDL cholesterol 170.  Apolipoprotein B mildly elevated at 104 (49-90 mg/dL).  Radiology:  No results found.  Cardiac Studies:   Coronary angiogram 12/16/2008: Proximal LAD stenting with 2.25 x 16 mm Taxus DES in mid LAD and 3.0 x 12 Xience drug-eluting stent in proximal LAD.  Residual 80% in large diagonal 1. 40% mid RCA, 30% mid circumflex and   EF 50%.  Echocardiogram 06/16/2017: Left ventricle cavity is normal in size. Mild concentric hypertrophy of the left ventricle. Mild decrease in global wall motion. Visual EF is 50-55%. Indeterminate diastolic function. Moderate (Grade II) aortic regurgitation. Mild aortic valve leaflet calcification. Mild interval increase in aortic regurgitation compared to echocardiogram in 2015.  Mild (Grade I) mitral regurgitation. Mildly restricted mitral valve leaflets. Mild tricuspid regurgitation. No evidence of pulmonary hypertension.  PCV MYOCARDIAL PERFUSION WO LEXISCAN 09/21/2022  Narrative Exercise Myoview stress test 09/21/2021: Exercise nuclear stress test was performed using Bruce protocol. Exercise time 3 minutes 33 seconds on Bruce protocol, achieved 5.15 METS, 88 % of APMHR. Hypertensive response to exercise: No (rest 150/72, peak 180/80) Stress ECG negative for ischemia. Small size, mild intensity, reversible perfusion defect involving the apical  lateral segment, cannot entirely rule out mild ischemia in either the distal RCA/LCx distribution.  Remainder of the myocardial segments illustrate homogenous tracer uptake without evidence of prior infarct. Calculated LVEF 67%, wall thickness and wall motion preserved. Prior Lexiscan dated 05/23/2017 reported attenuation artifact in the anterior wall with calculated LVEF 47%. Low risk study.  EKG:  EKG 09/23/2022: Normal sinus rhythm at rate of 85 bpm, left atrial enlargement, normal axis.  LVH.  No significant change from 10/23/2020.   Medications and allergies   Allergies  Allergen Reactions   Azithromycin Rash   Demerol [Meperidine] Diarrhea and Nausea And Vomiting     Current Outpatient Medications:    acetaminophen (TYLENOL) 500 MG tablet, Take 2 tablets (1,000 mg total) by mouth every 6 (six) hours. (Patient taking differently: Take 1,000 mg by mouth 2 (two) times daily.), Disp: 30 tablet, Rfl: 0   aspirin 325 MG tablet, Take 325 mg by mouth daily., Disp: , Rfl:    BD PEN NEEDLE NANO U/F 32G X 4 MM MISC, SMARTSIG:2 Pen Needle SUB-Q Daily, Disp: , Rfl:    Bempedoic Acid-Ezetimibe (NEXLIZET) 180-10 MG TABS, Take 1 tablet by mouth daily. (Patient taking differently: Take 1 tablet by mouth every other day.), Disp: 90 tablet, Rfl: 3   Cholecalciferol (VITAMIN D3) 25 MCG (1000 UT) tablet, Take 1,000 Units by mouth daily., Disp: , Rfl:    citalopram (CELEXA) 40 MG tablet, Take 40 mg by mouth daily., Disp: , Rfl:    Continuous Blood Gluc Sensor (FREESTYLE LIBRE 14 DAY SENSOR) MISC, Apply topically every 14 (fourteen) days., Disp: , Rfl:    Cranberry Soft 500 MG CHEW, Chew 500 mg by mouth daily., Disp: , Rfl:    fluocinonide gel (LIDEX) 0.05 %, Apply 1 Application topically daily as needed (irritation on tongue)., Disp: , Rfl:    ibuprofen (ADVIL) 200 MG tablet, Take 200 mg by mouth every 6 (six) hours as needed for moderate pain., Disp: , Rfl:  lidocaine (XYLOCAINE) 2 % solution, Use as  directed 15 mLs in the mouth or throat every 6 (six) hours as needed for mouth pain., Disp: , Rfl:    lidocaine-prilocaine (EMLA) cream, Apply to affected area once (Patient taking differently: Apply 1 Application topically as needed (port). Apply to affected area once), Disp: 30 g, Rfl: 3   magic mouthwash (lidocaine, diphenhydrAMINE, alum & mag hydroxide) suspension, Swish and spit 5 mLs by mouth 4 (four) times daily as needed for mouth pain., Disp: 240 mL, Rfl: 1   metoprolol tartrate (LOPRESSOR) 25 MG tablet, TAKE 1/2 TABLET TWICE DAILY, Disp: 180 tablet, Rfl: 3   Multiple Vitamins-Minerals (PRESERVISION AREDS) CAPS, Take 1 capsule by mouth in the morning and at bedtime., Disp: , Rfl:    nitroGLYCERIN (NITROSTAT) 0.4 MG SL tablet, Place 0.4 mg under the tongue every 5 (five) minutes as needed for chest pain., Disp: , Rfl:    ramipril (ALTACE) 2.5 MG capsule, Take 2.5 mg by mouth daily., Disp: , Rfl:    Sodium Chloride-Sodium Bicarb (SODIUM BICARBONATE/SODIUM CHLORIDE) SOLN, Swish and spit as needed for mouth pain, Disp: , Rfl:    TRESIBA FLEXTOUCH 200 UNIT/ML FlexTouch Pen, Inject 32 Units into the skin daily after lunch. We've reduced your insulin dose, please follow up with your PCP outpatient and adjust this further as needed., Disp: , Rfl:    valACYclovir (VALTREX) 500 MG tablet, Take 1 tablet (500 mg total) by mouth 2 (two) times daily., Disp: 60 tablet, Rfl: 0   vitamin B-12 (CYANOCOBALAMIN) 1000 MCG tablet, Take 1,000 mcg by mouth daily., Disp: , Rfl:    tiZANidine (ZANAFLEX) 2 MG tablet, Take 1 tablet (2 mg total) by mouth every 8 (eight) hours as needed for muscle spasms. (Patient not taking: Reported on 12/23/2022), Disp: 20 tablet, Rfl: 1  Assessment     ICD-10-CM   1. Coronary artery disease involving native coronary artery of native heart without angina pectoris  I25.10     2. Hypercholesteremia  E78.00     3. Primary hypertension  I10       No orders of the defined types were  placed in this encounter.  Medications Discontinued During This Encounter  Medication Reason   insulin aspart (NOVOLOG) 100 UNIT/ML injection    prochlorperazine (COMPAZINE) 10 MG tablet     No orders of the defined types were placed in this encounter.    Recommendations:    Karina Foster  is a  82 y.o.   Caucasian female with hypertension, hyperlipidemia, uncontrolled diabetes mellitus and coronary artery disease and angioplasty and stenting to the proximal and mid LAD in 2010 has a residual 80% large D1 stenosis, statin intolerant and presently on Nexlizet.  On her last office visit 6 months ago I recommended that she start with Leqvio.  In the interim she has had significant complications from right mastectomy and has had multiple hospitalizations and are physician visits.  1. Coronary artery disease involving native coronary artery of native heart without angina pectoris Patient underwent right mastectomy, complicated by periprocedural infection and wound dehiscence.  Presently doing well without recurrence of angina pectoris and low risk nuclear stress test with normal LVEF by nuclear stress test.  Continue present medications.  2. Hypercholesteremia We were contemplating starting her on Leqvio on her last office visit however due to significant issues that came around with breast cancer, it is probably not the best situation to start her on anything new for now.  She will  continue with necklace that.  Once her wounds have healed, she plans to go on chemotherapy.  In view of the significant changes that could occur with chemotherapy, I have decided not to pursue cholesterol workup for now.  Fortunately from cardiac standpoint she has remained stable.  3. Primary hypertension Blood pressure Controlled.  Reviewed external labs, renal function is also stable.  I will see her back in 6 months for follow-up.     Yates Decamp, MD, [facility] 12/23/2022, 1:41 PM Office: [phone] Pager:  [phone]

## 2022-12-24 ENCOUNTER — Inpatient Hospital Stay: Payer: Medicare Other | Attending: Hematology and Oncology | Admitting: Hematology and Oncology

## 2022-12-24 ENCOUNTER — Inpatient Hospital Stay: Payer: Medicare Other

## 2022-12-24 ENCOUNTER — Other Ambulatory Visit: Payer: Self-pay

## 2022-12-24 ENCOUNTER — Encounter (HOSPITAL_BASED_OUTPATIENT_CLINIC_OR_DEPARTMENT_OTHER): Payer: Medicare Other | Attending: General Surgery | Admitting: Internal Medicine

## 2022-12-24 VITALS — BP 137/59 | HR 83 | Temp 97.6°F | Resp 18 | Ht 67.0 in | Wt 151.7 lb

## 2022-12-24 DIAGNOSIS — C50411 Malignant neoplasm of upper-outer quadrant of right female breast: Secondary | ICD-10-CM | POA: Diagnosis not present

## 2022-12-24 DIAGNOSIS — Z170 Estrogen receptor positive status [ER+]: Secondary | ICD-10-CM

## 2022-12-24 DIAGNOSIS — Z923 Personal history of irradiation: Secondary | ICD-10-CM | POA: Insufficient documentation

## 2022-12-24 DIAGNOSIS — G4730 Sleep apnea, unspecified: Secondary | ICD-10-CM | POA: Diagnosis not present

## 2022-12-24 DIAGNOSIS — Z794 Long term (current) use of insulin: Secondary | ICD-10-CM | POA: Insufficient documentation

## 2022-12-24 DIAGNOSIS — Z08 Encounter for follow-up examination after completed treatment for malignant neoplasm: Secondary | ICD-10-CM | POA: Insufficient documentation

## 2022-12-24 DIAGNOSIS — I252 Old myocardial infarction: Secondary | ICD-10-CM | POA: Insufficient documentation

## 2022-12-24 DIAGNOSIS — I2510 Atherosclerotic heart disease of native coronary artery without angina pectoris: Secondary | ICD-10-CM | POA: Diagnosis not present

## 2022-12-24 DIAGNOSIS — Z9011 Acquired absence of right breast and nipple: Secondary | ICD-10-CM | POA: Insufficient documentation

## 2022-12-24 DIAGNOSIS — T8131XA Disruption of external operation (surgical) wound, not elsewhere classified, initial encounter: Secondary | ICD-10-CM | POA: Insufficient documentation

## 2022-12-24 DIAGNOSIS — Z853 Personal history of malignant neoplasm of breast: Secondary | ICD-10-CM | POA: Diagnosis not present

## 2022-12-24 DIAGNOSIS — Z87891 Personal history of nicotine dependence: Secondary | ICD-10-CM | POA: Insufficient documentation

## 2022-12-24 DIAGNOSIS — Z95828 Presence of other vascular implants and grafts: Secondary | ICD-10-CM

## 2022-12-24 DIAGNOSIS — X58XXXA Exposure to other specified factors, initial encounter: Secondary | ICD-10-CM | POA: Insufficient documentation

## 2022-12-24 DIAGNOSIS — E1140 Type 2 diabetes mellitus with diabetic neuropathy, unspecified: Secondary | ICD-10-CM | POA: Insufficient documentation

## 2022-12-24 DIAGNOSIS — E11622 Type 2 diabetes mellitus with other skin ulcer: Secondary | ICD-10-CM | POA: Insufficient documentation

## 2022-12-24 DIAGNOSIS — S21001A Unspecified open wound of right breast, initial encounter: Secondary | ICD-10-CM

## 2022-12-24 DIAGNOSIS — Z09 Encounter for follow-up examination after completed treatment for conditions other than malignant neoplasm: Secondary | ICD-10-CM | POA: Insufficient documentation

## 2022-12-24 LAB — CBC WITH DIFFERENTIAL (CANCER CENTER ONLY)
Abs Immature Granulocytes: 0.03 10*3/uL (ref 0.00–0.07)
Basophils Absolute: 0 10*3/uL (ref 0.0–0.1)
Basophils Relative: 1 %
Eosinophils Absolute: 0.4 10*3/uL (ref 0.0–0.5)
Eosinophils Relative: 6 %
HCT: 33.3 % — ABNORMAL LOW (ref 36.0–46.0)
Hemoglobin: 11.1 g/dL — ABNORMAL LOW (ref 12.0–15.0)
Immature Granulocytes: 1 %
Lymphocytes Relative: 20 %
Lymphs Abs: 1.3 10*3/uL (ref 0.7–4.0)
MCH: 31.7 pg (ref 26.0–34.0)
MCHC: 33.3 g/dL (ref 30.0–36.0)
MCV: 95.1 fL (ref 80.0–100.0)
Monocytes Absolute: 0.7 10*3/uL (ref 0.1–1.0)
Monocytes Relative: 10 %
Neutro Abs: 4 10*3/uL (ref 1.7–7.7)
Neutrophils Relative %: 62 %
Platelet Count: 304 10*3/uL (ref 150–400)
RBC: 3.5 MIL/uL — ABNORMAL LOW (ref 3.87–5.11)
RDW: 14.9 % (ref 11.5–15.5)
WBC Count: 6.4 10*3/uL (ref 4.0–10.5)
nRBC: 0 % (ref 0.0–0.2)

## 2022-12-24 LAB — CMP (CANCER CENTER ONLY)
ALT: 15 U/L (ref 0–44)
AST: 19 U/L (ref 15–41)
Albumin: 4.2 g/dL (ref 3.5–5.0)
Alkaline Phosphatase: 44 U/L (ref 38–126)
Anion gap: 8 (ref 5–15)
BUN: 21 mg/dL (ref 8–23)
CO2: 25 mmol/L (ref 22–32)
Calcium: 9.8 mg/dL (ref 8.9–10.3)
Chloride: 103 mmol/L (ref 98–111)
Creatinine: 0.92 mg/dL (ref 0.44–1.00)
GFR, Estimated: >60 mL/min (ref >60)
Glucose, Bld: 163 mg/dL — ABNORMAL HIGH (ref 70–99)
Potassium: 4.5 mmol/L (ref 3.5–5.1)
Sodium: 136 mmol/L (ref 135–145)
Total Bilirubin: 0.4 mg/dL (ref 0.3–1.2)
Total Protein: 7.5 g/dL (ref 6.5–8.1)

## 2022-12-24 MED ORDER — SODIUM CHLORIDE 0.9% FLUSH
10.0000 mL | Freq: Once | INTRAVENOUS | Status: AC
  Administered 2022-12-24: 10 mL

## 2022-12-24 NOTE — Progress Notes (Signed)
[address] [place] ([id number]) [id number] Page 1 of 4 Visit Report for 12/24/2022 Abuse Risk Screen Details Patient Name: Date of Service: Karina Karina Foster, Karina 12/24/2022 8:00 A M Medical Record Number: [id number] Patient Account Number: [id number] Date of Birth/Sex: Treating RN: [birth year] (82 y.o. Toniann Fail Primary Care Liane Tribbey: Hoyle Sauer Other Clinician: Referring Kohlton Gilpatrick: Treating Euva Rundell/Extender: Jodie Echevaria, Ravisankar R Weeks in Treatment: 0 Abuse Risk Screen Items Answer ABUSE RISK SCREEN: Has anyone close to you tried to hurt or harm you recentlyo No Do you feel uncomfortable with anyone in your familyo No Has anyone forced you do things that you didnt want to doo No Electronic Signature(s) Signed: 12/24/2022 11:19:08 AM By: Fonnie Mu RN Entered By: Fonnie Mu on 12/24/2022 08:04:02 -------------------------------------------------------------------------------- Activities of Daily Living Details Patient Name: Date of Service: Karina Foster, Karina 12/24/2022 8:00 A M Medical Record Number: [id number] Patient Account Number: [id number] Date of Birth/Sex: Treating RN: 06/07/1941 (82 y.o. Toniann Fail Primary Care Kwamane Whack: Hoyle Sauer Other Clinician: Referring Chaya Dehaan: Treating Daquavion Catala/Extender: Jodie Echevaria, Ravisankar R Weeks in Treatment: 0 Activities of Daily Living Items Answer Activities of Daily Living (Please select one for each item) Drive Automobile Completely Able T Medications ake Completely Able Use T elephone Completely Able Care for Appearance Completely Able Use T oilet Completely Able Bath / Shower Completely Able Dress Self Completely Able Feed Self Completely Able Walk Completely Able Get In / Out Bed Completely Able Housework Completely Able Prepare Meals Completely Able Handle Money Completely Able Shop for Self Completely Able Electronic  Signature(s) Signed: 12/24/2022 11:19:08 AM By: Fonnie Mu RN Entered By: Fonnie Mu on 12/24/2022 08:04:16 -------------------------------------------------------------------------------- Education Screening Details Patient Name: Date of Service: Karina Dus. 12/24/2022 8:00 A M Medical Record Number: [id number] Patient Account Number: [id number] Date of Birth/Sex: Treating RN: Sep 13, 1940 (82 y.o. Toniann Fail Primary Care Gleb Mcguire: Hoyle Sauer Other Clinician: Referring Tavone Caesar: Treating Analiza Cowger/Extender: Jodie Echevaria, Ravisankar R Weeks in Treatment[address] [place] ([id number]) [id number] Page 2 of 4 Primary Learner Assessed: Patient Learning Preferences/Education Level/Primary Language Learning Preference: Explanation, Demonstration, Communication Board, Printed Material Highest Education Level: [employer] or Above Preferred Language: Economist Language Barrier: No Translator Needed: No Memory Deficit: No Emotional Barrier: No Cultural/Religious Beliefs Affecting Medical Care: No Physical Barrier Impaired Vision: Yes Glasses Impaired Hearing: No Decreased Hand dexterity: No Knowledge/Comprehension Knowledge Level: High Comprehension Level: High Ability to understand written instructions: High Ability to understand verbal instructions: High Motivation Anxiety Level: Calm Cooperation: Cooperative Education Importance: Denies Need Interest in Health Problems: Asks Questions Perception: Coherent Willingness to Engage in Self-Management High Activities: Readiness to Engage in Self-Management High Activities: Electronic Signature(s) Signed: 12/24/2022 11:19:08 AM By: Fonnie Mu RN Entered By: Fonnie Mu on 12/24/2022 08:06:52 -------------------------------------------------------------------------------- Fall Risk Assessment Details Patient Name: Date of  Service: Karina Dus. 12/24/2022 8:00 A M Medical Record Number: [id number] Patient Account Number: [id number] Date of Birth/Sex: Treating RN: 05-01-1941 (82 y.o. Karina Karina Foster, Karina Foster Primary Care Kacy Hegna: Hoyle Sauer Other Clinician: Referring Shamya Macfadden: Treating Rayn Shorb/Extender: Jodie Echevaria, Ravisankar R Weeks in Treatment: 0 Fall Risk Assessment Items Have you had 2 or more falls in the last 12 monthso 0 No Have you had any fall that resulted in injury in the last 12 monthso 0 No FALLS RISK SCREEN History of falling - immediate or within 3 months 0 No Secondary diagnosis (Do you have 2 or more medical diagnoseso) 0 No  Ambulatory aid None/bed rest/wheelchair/nurse 0 No Crutches/cane/walker 0 No Furniture 0 No Intravenous therapy Access/Saline/Heparin Lock 0 No Gait/Transferring Normal/ bed rest/ wheelchair 0 No Weak (short steps with or without shuffle, stooped but able to lift head while walking, may seek 0 No support from furniture) Impaired (short steps with shuffle, may have difficulty arising from chair, head down, impaired 0 No balance) Mental Status Oriented to own ability 0 No Overestimates or forgets limitations 0 No Risk Level: Low Risk Score: [address] ([id number]) [phone] [id number] Page 3 of 4 Electronic Signature(s) -------------------------------------------------------------------------------- Foot Assessment Details Patient Name: Date of Service: Karina Foster, Karina 12/24/2022 8:00 A M Medical Record Number: [id number] Patient Account Number: [id number] Date of Birth/Sex: Treating RN: 07/15/1941 (82 y.o. Karina Karina Foster, Karina Foster Primary Care Jabori Henegar: Hoyle Sauer Other Clinician: Referring Lathon Adan: Treating Truc Winfree/Extender: Jodie Echevaria, Ravisankar R Weeks in Treatment: 0 Foot Assessment Items Site Locations + = Sensation present, - = Sensation absent, C = Callus, U = Ulcer R = Redness,  W = Warmth, M = Maceration, PU = Pre-ulcerative lesion F = Fissure, S = Swelling, D = Dryness Assessment Right: Left: Other Deformity: No No Prior Foot Ulcer: No No Prior Amputation: No No Charcot Joint: No No Ambulatory Status: Gait: Notes N/A no LE wounds Electronic Signature(s) Signed: 12/24/2022 11:19:08 AM By: Fonnie Mu RN Entered By: Fonnie Mu on 12/24/2022 08:04:51 -------------------------------------------------------------------------------- Nutrition Risk Screening Details Patient Name: Date of Service: Karina Foster, Karina Karina Foster 12/24/2022 8:00 A M Medical Record Number: [id number] Patient Account Number: [id number] Date of Birth/Sex: Treating RN: 1940/09/19 (82 y.o. Toniann Fail Primary Care Errick Salts: Hoyle Sauer Other Clinician: Referring Dollene Mallery: Treating Sanyah Molnar/Extender: Jodie Echevaria, Ravisankar R Weeks in Treatment: 0 Height (in): Weight (lbs): Body Mass Index (BMI): [place] ([id number]) [phone] [id number] Page 4 of 4 Nutrition Risk Screening Items Score Screening NUTRITION RISK SCREEN: I have an illness or condition that made me change the kind and/or amount of food I eat 0 No I eat fewer than two meals per day 0 No I eat few fruits and vegetables, or milk products 0 No I have three or more drinks of beer, liquor or wine almost every day 0 No I have tooth or mouth problems that make it hard for me to eat 0 No I don't always have enough money to buy the food I need 0 No I eat alone most of the time 0 No I take three or more different prescribed or over-the-counter drugs a day 0 No Without wanting to, I have lost or gained 10 pounds in the last six months 0 No I am not always physically able to shop, cook and/or feed myself 0 No Nutrition Protocols Good Risk Protocol 0 No interventions needed Moderate Risk Protocol High Risk Proctocol Risk Level: Good Risk Score: 0 Electronic  Signature(s) Signed: 12/24/2022 11:19:08 AM By: Fonnie Mu RN Entered By: Fonnie Mu on 12/24/2022 08:04:40

## 2022-12-24 NOTE — Assessment & Plan Note (Addendum)
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 no role of antiestrogen therapy since her repeat prognostic panel was triple negative -------------------------------------------------------------------------------------------------------------------------------------------- Current treatment: Cycle 2 CMF (on hold for recovery from recent wound infection) Chemo toxicities: Hospitalization 11/21/2022-11/26/2022: Cellulitis with neutropenic fever: Following with wound care team completed antibiotics 2 days ago. Mild source: Currently on Valtrex  Based upon the wound situation, we decided to hold off on doing chemotherapy today and we will postpone it for 3 weeks.   Return to clinic in 3 weeks for cycle 2

## 2022-12-24 NOTE — Progress Notes (Signed)
Mentor, Sprague ([id number]) [id number] Page 1 of 8 Visit Report for 12/24/2022 Allergy List Details Patient Name: Date of Service: Karina Foster, Karina Foster 12/24/2022 8:00 A M Medical Record Number: [id number] Patient Account Number: [id number] Date of Birth/Sex: Treating RN: 1940-12-04 (82 y.o. Toniann Fail Primary Care Agapita Savarino: Hoyle Sauer Other Clinician: Referring Ilian Wessell: Treating Keshaun Dubey/Extender: Jodie Echevaria, Ravisankar R Weeks in Treatment: 0 Allergies Active Allergies azithromycin Demerol Allergy Notes Electronic Signature(s) Signed: 12/24/2022 11:19:08 AM By: Fonnie Mu RN Entered By: Fonnie Mu on 12/24/2022 08:03:29 -------------------------------------------------------------------------------- Arrival Information Details Patient Name: Date of Service: Karina Dus. 12/24/2022 8:00 A M Medical Record Number: [id number] Patient Account Number: [id number] Date of Birth/Sex: Treating RN: 1941-05-10 (82 y.o. Toniann Fail Primary Care Azoria Abbett: Hoyle Sauer Other Clinician: Referring Konstance Happel: Treating Ryelee Albee/Extender: Vevelyn Royals Weeks in Treatment: 0 Visit Information Patient Arrived: Ambulatory Arrival Time: 08:01 Accompanied By: self Transfer Assistance: None Patient Identification Verified: Yes Secondary Verification Process Completed: Yes Patient Requires Transmission-Based Precautions: No Patient Has Alerts: No Electronic Signature(s) Signed: 12/24/2022 11:19:08 AM By: Fonnie Mu RN Entered By: Fonnie Mu on 12/24/2022 08:02:10 -------------------------------------------------------------------------------- Clinic Level of Care Assessment Details Patient Name: Date of Service: Karina Foster 12/24/2022 8:00 A M Medical Record Number: [id number] Patient Account Number: [id number] Date of Birth/Sex: Treating RN: [birth year] (82 y.o. Toniann Fail Primary Care Zehra Rucci: Hoyle Sauer Other Clinician: Referring Kamaiya Antilla: Treating Jaliah Foody/Extender: Jodie Echevaria, Ravisankar R Weeks in Treatment: 0 Clinic Level of Care Assessment Items TOOL 4 Quantity Score X- 1 0 Use when only an EandM is performed on FOLLOW-UP visit ASSESSMENTS - Nursing Assessment / Reassessment X- 1 10 Reassessment of Co-morbidities (includes updates in patient status) CASEE, KNEPP ([id number]) [id number] Page 2 of 8 X- 1 5 Reassessment of Adherence to Treatment Plan ASSESSMENTS - Wound and Skin A ssessment / Reassessment X - Simple Wound Assessment / Reassessment - one wound 1 5 []  - 0 Complex Wound Assessment / Reassessment - multiple wounds []  - 0 Dermatologic / Skin Assessment (not related to wound area) ASSESSMENTS - Focused Assessment []  - 0 Circumferential Edema Measurements - multi extremities []  - 0 Nutritional Assessment / Counseling / Intervention []  - 0 Lower Extremity Assessment (monofilament, tuning fork, pulses) []  - 0 Peripheral Arterial Disease Assessment (using hand held doppler) ASSESSMENTS - Ostomy and/or Continence Assessment and Care []  - 0 Incontinence Assessment and Management []  - 0 Ostomy Care Assessment and Management (repouching, etc.) PROCESS - Coordination of Care X - Simple Patient / Family Education for ongoing care 1 15 []  - 0 Complex (extensive) Patient / Family Education for ongoing care X- 1 10 Staff obtains Chiropractor, Records, T Results / Process Orders est X- 1 10 Staff telephones HHA, Nursing Homes / Clarify orders / etc []  - 0 Routine Transfer to another Facility (non-emergent condition) []  - 0 Routine Hospital Admission (non-emergent condition) X- 1 15 New Admissions / Manufacturing engineer / Ordering NPWT Apligraf, etc. , []  - 0 Emergency Hospital Admission (emergent condition) X- 1 10 Simple Discharge Coordination []  - 0 Complex  (extensive) Discharge Coordination PROCESS - Special Needs []  - 0 Pediatric / Minor Patient Management []  - 0 Isolation Patient Management []  - 0 Hearing / Language / Visual special needs []  - 0 Assessment of Community assistance (transportation, D/C planning, etc.) []  - 0 Additional assistance / Altered mentation []  - 0 Support Surface(s) Assessment (bed, cushion, seat, etc.) INTERVENTIONS - Wound Cleansing /  Measurement X - Simple Wound Cleansing - one wound 1 5  - 0 Complex Wound Cleansing - multiple wounds X- 1 5 Wound Imaging (photographs - any number of wounds)  - 0 Wound Tracing (instead of photographs) X- 1 5 Simple Wound Measurement - one wound  - 0 Complex Wound Measurement - multiple wounds INTERVENTIONS - Wound Dressings X - Small Wound Dressing one or multiple wounds 1 10  - 0 Medium Wound Dressing one or multiple wounds  - 0 Large Wound Dressing one or multiple wounds  - 0 Application of Medications - topical  - 0 Application of Medications - injection Gibson ([id number]) [phone].[id number] Page 3 of 8 INTERVENTIONS - Miscellaneous  - 0 External ear exam  - 0 Specimen Collection (cultures, biopsies, blood, body fluids, etc.)  - 0 Specimen(s) / Culture(s) sent or taken to Lab for analysis  - 0 Patient Transfer (multiple staff / Michiel Sites Lift / Similar devices)  - 0 Simple Staple / Suture removal (25 or less)  - 0 Complex Staple / Suture removal (26 or more)  - 0 Hypo / Hyperglycemic Management (close monitor of Blood Glucose)  - 0 Ankle / Brachial Index (ABI) - do not check if billed separately X- 1 5 Vital Signs Has the patient been seen at the hospital within the last three years: Yes Total Score: 110 Level Of Care: New/Established - Level 3 Electronic Signature(s) Signed: 12/24/2022 11:19:08 AM By: Fonnie Mu RN Entered By: Fonnie Mu on 12/24/2022  08:48:17 -------------------------------------------------------------------------------- Encounter Discharge Information Details Patient Name: Date of Service: Karina Dus. 12/24/2022 8:00 A M Medical Record Number: [id number] Patient Account Number: [id number] Date of Birth/Sex: Treating RN: [birth year] (82 y.o. Toniann Fail Primary Care Paris Hohn: Hoyle Sauer Other Clinician: Referring [place]ily Zaragoza: Treating Pollyann Roa/Extender: Vevelyn Royals Weeks in Treatment: 0 Encounter Discharge Information Items Discharge Condition: Stable Ambulatory Status: Ambulatory Discharge Destination: Home Transportation: Private Auto Accompanied By: self Schedule Follow-up Appointment: Yes Clinical Summary of Care: Patient Declined Electronic Signature(s) Signed: 12/24/2022 11:19:08 AM By: Fonnie Mu RN Entered By: Fonnie Mu on 12/24/2022 09:02:43 -------------------------------------------------------------------------------- Lower Extremity Assessment Details Patient Name: Date of Service: Karina Dus. 12/24/2022 8:00 A M Medical Record Number: [id number] Patient Account Number: [id number] Date of Birth/Sex: Treating RN: April 26, 1941 (82 y.o. Toniann Fail Primary Care Gwenevere Goga: Hoyle Sauer Other Clinician: Referring Renton Berkley: Treating Kule Gascoigne/Extender: Jodie Echevaria, Ravisankar R Weeks in Treatment: 0 Electronic Signature(s) Signed: 12/24/2022 11:19:08 AM By: Fonnie Mu RN Entered By: Fonnie Mu on 12/24/2022 08:04:58 -------------------------------------------------------------------------------- Multi Wound Chart Details Patient Name: Date of Service: Karina Dus. 12/24/2022 8:00 A M Medical Record Number: [id number] Patient Account Number: [id number] AHMIYAH, COIL ([id number]) [id number] Page 4 of 8 Date of Birth/Sex: Treating RN: 1940/12/30 (82 y.o. F) Primary Care  Keivon Garden: Other Clinician: Chilton Greathouse R Referring Damari Hiltz: Treating Zeppelin Beckstrand/Extender: Jodie Echevaria, Ravisankar R Weeks in Treatment: 0 Vital Signs Height(in): Capillary Blood Glucose(mg/dl): 093 Weight(lbs): Pulse(bpm): 88 Body Mass Index(BMI): Blood Pressure(mmHg): 125/72 Temperature(F): 98.3 Respiratory Rate(breaths/min): 17 [1:Photos:] [N/A:N/A] Right Axilla N/A N/A Wound Location: Gradually Appeared N/A N/A Wounding Event: T be determined o N/A N/A Primary Etiology: Sleep Apnea, Coronary Artery N/A N/A Comorbid History: Disease, Myocardial Infarction, Type II Diabetes, Neuropathy, Received Chemotherapy, Received Radiation 10/14/2022 N/A N/A Date Acquired: 0 N/A N/A Weeks of Treatment: Open N/A N/A Wound Status: No N/A N/A Wound Recurrence: 3.3x3.2x1.5 N/A N/A Measurements L x W x D (cm) 8.294 N/A N/A  A (cm) : rea 12.441 N/A N/A Volume (cm) : 9 Starting Position 1 (o'clock): 3 Ending Position 1 (o'clock): 2.5 Maximum Distance 1 (cm): Yes N/A N/A Undermining: Full Thickness With Exposed Support N/A N/A Classification: Structures Medium N/A N/A Exudate Amount: Serosanguineous N/A N/A Exudate Type: red, brown N/A N/A Exudate Color: Distinct, outline attached N/A N/A Wound Margin: Large (67-100%) N/A N/A Granulation Amount: Red, Pink N/A N/A Granulation Quality: Small (1-33%) N/A N/A Necrotic Amount: Fat Layer (Subcutaneous Tissue): Yes N/A N/A Exposed Structures: Fascia: No Tendon: No Muscle: No Joint: No Bone: No Small (1-33%) N/A N/A Epithelialization: Excoriation: No N/A N/A Periwound Skin Texture: Induration: No Callus: No Crepitus: No Rash: No Scarring: No Maceration: No N/A N/A Periwound Skin Moisture: Dry/Scaly: No Atrophie Blanche: No N/A N/A Periwound Skin Color: Cyanosis: No Ecchymosis: No Erythema: No Hemosiderin Staining: No Mottled: No Pallor: No Rubor: No No Abnormality N/A  N/A Temperature: Yes N/A N/A Tenderness on Palpation: Treatment Notes Wound #1 (Axilla) Wound Laterality: Right [address] [place] G ([id number]) [id number] Page 5 of 8 Vashe 5.8 (oz) Discharge Instruction: Cleanse the wound with Vashe prior to applying a clean dressing using gauze sponges, not tissue or cotton balls. Peri-Wound Care Skin Prep Discharge Instruction: Use skin prep as directed Topical Primary Dressing vashe wet to dry Secondary Dressing Zetuvit Plus Silicone Border Dressing 5x5 (in/in) Discharge Instruction: Apply silicone border over primary dressing as directed. Secured With Compression Wrap Compression Stockings Add-Ons Electronic Signature(s) Signed: 12/24/2022 12:25:56 PM By: Geralyn Corwin DO Entered By: Geralyn Corwin on 12/24/2022 09:03:59 -------------------------------------------------------------------------------- Multi-Disciplinary Care Plan Details Patient Name: Date of Service: Karina Dus. 12/24/2022 8:00 A M Medical Record Number: [id number] Patient Account Number: [id number] Date of Birth/Sex: Treating RN: 02-28-1941 (82 y.o. Toniann Fail Primary Care Joniqua Sidle: Hoyle Sauer Other Clinician: Referring Ayrabella Labombard: Treating Jhoanna Heyde/Extender: Jodie Echevaria, Ravisankar R Weeks in Treatment: 0 Active Inactive Orientation to the Wound Care Program Nursing Diagnoses: Knowledge deficit related to the wound healing center program Goals: Patient/caregiver will verbalize understanding of the Wound Healing Center Program Date Initiated: 12/24/2022 Target Resolution Date: 01/13/2023 Goal Status: Active Interventions: Provide education on orientation to the wound center Notes: Wound/Skin Impairment Nursing Diagnoses: Impaired tissue integrity Knowledge deficit related to ulceration/compromised skin integrity Goals: Patient will have a decrease in wound volume by X% from date: (specify in  notes) Date Initiated: 12/24/2022 Target Resolution Date: 01/13/2023 Goal Status: Active Patient/caregiver will verbalize understanding of skin care regimen Date Initiated: 12/24/2022 Target Resolution Date: 01/14/2023 Goal Status: Active Ulcer/skin breakdown will have a volume reduction of 30% by week 4 Date Initiated: 12/24/2022 Target Resolution Date: 01/15/2023 Goal Status: Active Ulcer/skin breakdown will have a volume reduction of 50% by week [address] ([id number]) [phone].pdf Page 6 of 8 Date Initiated: 12/24/2022 Target Resolution Date: 01/14/2023 Goal Status: Active Interventions: Assess patient/caregiver ability to obtain necessary supplies Assess patient/caregiver ability to perform ulcer/skin care regimen upon admission and as needed Assess ulceration(s) every visit Notes: Electronic Signature(s) Signed: 12/24/2022 11:19:08 AM By: Fonnie Mu RN Entered By: Fonnie Mu on 12/24/2022 08:22:02 -------------------------------------------------------------------------------- Pain Assessment Details Patient Name: Date of Service: Karina Dus. 12/24/2022 8:00 A M Medical Record Number: [id number] Patient Account Number: [id number] Date of Birth/Sex: Treating RN: 1940-10-19 (82 y.o. Toniann Fail Primary Care Adahlia Stembridge: Hoyle Sauer Other Clinician: Referring Cortny Bambach: Treating Naina Sleeper/Extender: Jodie Echevaria, Ravisankar R Weeks in Treatment: 0 Active Problems Location of Pain Severity and Description of Pain Patient Has Paino  No Site Locations Pain Management and Medication Current Pain Management: Electronic Signature(s) Signed: 12/24/2022 11:19:08 AM By: Fonnie Mu RN Entered By: Fonnie Mu on 12/24/2022 08:05:04 -------------------------------------------------------------------------------- Patient/Caregiver Education Details Patient Name: Date of Service: Karina Dus  4/19/2024andnbsp8:00 A M Medical Record Number: [id number] Patient Account Number: [id number] Date of Birth/Gender: Treating RN: 1941/08/19 (82 y.o. Toniann Fail Primary Care Physician: Hoyle Sauer Other Clinician: Referring Physician: Treating Physician/Extender: Otho Najjar in Treatment: 0 Education Assessment Education Provided ToANNAMARIE, YAMAGUCHI ([id number]) [id number] Page 7 of 8 Patient Education Topics Provided Wound/Skin Impairment: Methods: Explain/Verbal Responses: Reinforcements needed, State content correctly Electronic Signature(s) Signed: 12/24/2022 11:19:08 AM By: Fonnie Mu RN Entered By: Fonnie Mu on 12/24/2022 08:22:12 -------------------------------------------------------------------------------- Wound Assessment Details Patient Name: Date of Service: Karina Dus. 12/24/2022 8:00 A M Medical Record Number: [id number] Patient Account Number: [id number] Date of Birth/Sex: Treating RN: 02/14/1941 (82 y.o. Ardis Rowan, Lauren Primary Care Rheda Kassab: Hoyle Sauer Other Clinician: Referring Hari Casaus: Treating Sindi Beckworth/Extender: Jodie Echevaria, Ravisankar R Weeks in Treatment: 0 Wound Status Wound Number: 1 Primary T be determined o Etiology: Wound Location: Right Axilla Wound Open Wounding Event: Gradually Appeared Status: Date Acquired: 10/14/2022 Comorbid Sleep Apnea, Coronary Artery Disease, Myocardial Infarction, Type Weeks Of Treatment: 0 History: II Diabetes, Neuropathy, Received Chemotherapy, Received Clustered Wound: No Radiation Photos Wound Measurements Length: (cm) 3.3 Width: (cm) 3.2 Depth: (cm) 1.5 Area: (cm) 8.294 Volume: (cm) 12.441 % Reduction in Area: % Reduction in Volume: Epithelialization: Small (1-33%) Tunneling: No Undermining: Yes Starting Position (o'clock): 9 Ending Position (o'clock): 3 Maximum Distance: (cm) 2.5 Wound  Description Classification: Full Thickness With Exposed Support Structures Wound Margin: Distinct, outline attached Exudate Amount: Medium Exudate Type: Serosanguineous Exudate Color: red, brown Foul Odor After Cleansing: No Slough/Fibrino Yes Wound Bed Granulation Amount: Large (67-100%) Exposed Structure Granulation Quality: Red, Pink Fascia Exposed: No Necrotic Amount: Small (1-33%) Fat Layer (Subcutaneous Tissue) Exposed: Yes Necrotic Quality: Adherent Slough Tendon Exposed: No Muscle Exposed: No Joint Exposed: No Bone Exposed: No [place] ([id number]) [id number] Page 8 of 8 Periwound Skin Texture Texture Color No Abnormalities Noted: No No Abnormalities Noted: No Callus: No Atrophie Blanche: No Crepitus: No Cyanosis: No Excoriation: No Ecchymosis: No Induration: No Erythema: No Rash: No Hemosiderin Staining: No Scarring: No Mottled: No Pallor: No Moisture Rubor: No No Abnormalities Noted: No Dry / Scaly: No Temperature / Pain Maceration: No Temperature: No Abnormality Tenderness on Palpation: Yes Electronic Signature(s) Signed: 12/24/2022 11:19:08 AM By: Fonnie Mu RN Entered By: Fonnie Mu on 12/24/2022 08:15:58 -------------------------------------------------------------------------------- Vitals Details Patient Name: Date of Service: Karina Dus. 12/24/2022 8:00 A M Medical Record Number: [id number] Patient Account Number: [id number] Date of Birth/Sex: Treating RN: 05/24/1941 (82 y.o. Ardis Rowan, Lauren Primary Care Godric Lavell: Hoyle Sauer Other Clinician: Referring Tannisha Kennington: Treating Mayumi Summerson/Extender: Jodie Echevaria, Ravisankar R Weeks in Treatment: 0 Vital Signs Time Taken: 08:02 Temperature (F): 98.3 Pulse (bpm): 88 Respiratory Rate (breaths/min): 17 Blood Pressure (mmHg): 125/72 Capillary Blood Glucose (mg/dl): 433 Reference Range: 80 - 120 mg / dl Electronic  Signature(s) Signed: 12/24/2022 11:19:08 AM By: Fonnie Mu RN Entered By: Fonnie Mu on 12/24/2022 08:03:21

## 2022-12-24 NOTE — Progress Notes (Signed)
[place], [place] ([id number]) [id number] Page 1 of 7 Visit Report for 12/24/2022 Chief Complaint Document Details Patient Name: Date of Service: Karina Foster, Karina Foster 12/24/2022 8:00 A M Medical Record Number: [id number] Patient Account Number: [id number] Date of Birth/Sex: Treating RN: 1941-05-09 (82 y.o. F) Primary Care Provider: Hoyle Sauer Other Clinician: Referring Provider: Treating Provider/Extender: Jodie Echevaria, Ravisankar R Weeks in Treatment: 0 Information Obtained from: Patient Chief Complaint 12/24/2022; Right-sided wound status postmastectomy Electronic Signature(s) Signed: 12/24/2022 12:25:56 PM By: Geralyn Corwin DO Entered By: Geralyn Corwin on 12/24/2022 09:04:48 -------------------------------------------------------------------------------- HPI Details Patient Name: Date of Service: Karina Dus. 12/24/2022 8:00 A M Medical Record Number: [id number] Patient Account Number: [id number] Date of Birth/Sex: Treating RN: 1941/06/12 (82 y.o. F) Primary Care Provider: Hoyle Sauer Other Clinician: Referring Provider: Treating Provider/Extender: Jodie Echevaria, Ravisankar R Weeks in Treatment: 0 History of Present Illness HPI Description: 12/24/2022 Karina Foster is a 82 year old female with a past medical history of controlled type 2 diabetes, right sided breast cancer status postmastectomy pursuing chemotherapy that presents the clinic for a 18-month history of nonhealing surgical wound dehiscence to the right side status postmastectomy. On 10/14/2022 patient had a right mastectomy by Dr. Donell Beers and subsequently developed cellulitis and wound dehiscence. She has been using saline wet-to-dry dressings. She currently denies signs of infection. She did not tolerate her first round of chemotherapy and this is being placed on hold for now. She did not have radiation or implants. She does state that 27 years ago she had a  lumpectomy to the site with radiation. Electronic Signature(s) Signed: 12/24/2022 12:25:56 PM By: Geralyn Corwin DO Entered By: Geralyn Corwin on 12/24/2022 09:12:15 -------------------------------------------------------------------------------- Physical Exam Details Patient Name: Date of Service: Karina Foster 12/24/2022 8:00 A M Medical Record Number: [id number] Patient Account Number: [id number] Date of Birth/Sex: Treating RN: May 19, 1941 (82 y.o. F) Primary Care Provider: Hoyle Sauer Other Clinician: Referring Provider: Treating Provider/Extender: Jodie Echevaria, Ravisankar R Weeks in Treatment: 0 Constitutional respirations regular, non-labored and within target range for patient.Marland Kitchen Psychiatric pleasant and cooperative. Notes T the right side postmastectomy surgical site there is wound dehiscence to the lateral aspect with granulation tissue at the opening and undermining o circumferentially. No signs of surrounding infection including increased warmth, erythema or purulent drainage. [place], [place] ([id number]) [id number] Page 2 of 7 Electronic Signature(s) Signed: 12/24/2022 12:25:56 PM By: Geralyn Corwin DO Entered By: Geralyn Corwin on 12/24/2022 09:14:35 -------------------------------------------------------------------------------- Physician Orders Details Patient Name: Date of Service: Karina Dus. 12/24/2022 8:00 A M Medical Record Number: [id number] Patient Account Number: [id number] Date of Birth/Sex: Treating RN: 01-Nov-1940 (82 y.o. Karina Foster Primary Care Provider: Hoyle Sauer Other Clinician: Referring Provider: Treating Provider/Extender: Jodie Echevaria, Ravisankar R Weeks in Treatment: 0 Verbal / Phone Orders: No Diagnosis Coding Follow-up Appointments ppointment in 2 weeks. - w/ Dr. Mikey Bussing and Maryruth Bun Rm # 9 Friday 01/07/23 @ 11:00 Return A Other: - once you receive the wound  vac, bring it into your next appointment with you. Once we apply the wound vac, The nurses at [facility] will begin changing the wound vac Monday's and Wednesday's and you'll come to the wound care center on Friday's. Bathing/ Shower/ Hygiene May shower with protection but do not get wound dressing(s) wet. Protect dressing(s) with water repellant cover (for example, large plastic bag) or a cast cover and may then take shower. Negative Presssure Wound Therapy Wound Vac to wound continuously at 146mm/hg pressure -  Medela wound vac with white and black foam combination. Order today 12/24/22. Black and White Foam combination Wound Treatment Wound #1 - Axilla Wound Laterality: Right Cleanser: Vashe 5.8 (oz) 1 x Per Day/15 Days Discharge Instructions: Cleanse the wound with Vashe prior to applying a clean dressing using gauze sponges, not tissue or cotton balls. Peri-Wound Care: Skin Prep (DME) (Generic) 1 x Per Day/15 Days Discharge Instructions: Use skin prep as directed Prim Dressing: vashe wet to dry ary 1 x Per Day/15 Days Secondary Dressing: Zetuvit Plus Silicone Border Dressing 5x5 (in/in) (DME) (Generic) 1 x Per Day/15 Days Discharge Instructions: Apply silicone border over primary dressing as directed. Electronic Signature(s) Signed: 12/24/2022 12:25:56 PM By: Geralyn Corwin DO Entered By: Geralyn Corwin on 12/24/2022 09:14:43 -------------------------------------------------------------------------------- Problem List Details Patient Name: Date of Service: Karina Dus. 12/24/2022 8:00 A M Medical Record Number: [id number] Patient Account Number: [id number] Date of Birth/Sex: Treating RN: Feb 01, 1941 (82 y.o. F) Primary Care Provider: Hoyle Sauer Other Clinician: Referring Provider: Treating Provider/Extender: Jodie Echevaria, Ravisankar R Weeks in Treatment: 0 Active Problems ICD-10 Encounter Code Description Active Date MDM Diagnosis S21.001A Unspecified  open wound of right breast, initial encounter 12/24/2022 No Yes C50.411 Malignant neoplasm of upper-outer quadrant of right female breast 12/24/2022 No Yes [place] ([id number]) [id number].pdf Page 3 of 7 E11.622 Type 2 diabetes mellitus with other skin ulcer 12/24/2022 No Yes Inactive Problems Resolved Problems Electronic Signature(s) Signed: 12/24/2022 12:25:56 PM By: Geralyn Corwin DO Entered By: Geralyn Corwin on 12/24/2022 09:03:54 -------------------------------------------------------------------------------- Progress Note Details Patient Name: Date of Service: Karina Dus. 12/24/2022 8:00 A M Medical Record Number: [id number] Patient Account Number: [id number] Date of Birth/Sex: Treating RN: 23-Aug-1941 (82 y.o. F) Primary Care Provider: Hoyle Sauer Other Clinician: Referring Provider: Treating Provider/Extender: Jodie Echevaria, Ravisankar R Weeks in Treatment: 0 Subjective Chief Complaint Information obtained from Patient 12/24/2022; Right-sided wound status postmastectomy History of Present Illness (HPI) 12/24/2022 Karina Foster is a 82 year old female with a past medical history of controlled type 2 diabetes, right sided breast cancer status postmastectomy pursuing chemotherapy that presents the clinic for a 46-month history of nonhealing surgical wound dehiscence to the right side status postmastectomy. On 10/14/2022 patient had a right mastectomy by Dr. Donell Beers and subsequently developed cellulitis and wound dehiscence. She has been using saline wet-to-dry dressings. She currently denies signs of infection. She did not tolerate her first round of chemotherapy and this is being placed on hold for now. She did not have radiation or implants. She does state that 27 years ago she had a lumpectomy to the site with radiation. Patient History Information obtained from Patient, Chart. Allergies azithromycin, Demerol Family  History Unknown History. Social History Former smoker, Marital Status - Married, Alcohol Use - Never, Drug Use - No History, Caffeine Use - Rarely. Medical History Respiratory Patient has history of Sleep Apnea Cardiovascular Patient has history of Coronary Artery Disease, Myocardial Infarction Endocrine Patient has history of Type II Diabetes Neurologic Patient has history of Neuropathy - HANDS and feet Oncologic Patient has history of Received Chemotherapy - right breast, Received Radiation - right breat ca Patient is treated with Insulin. Hospitalization/Surgery History - right total mastectomy. - portacath placement. - breast biopsy right. - cataract extraction. - coronary angioplasty with stent placement. Review of Systems (ROS) Constitutional Symptoms (General Health) Denies complaints or symptoms of Fatigue, Fever, Chills, Marked Weight Change. Eyes Denies complaints or symptoms of Dry Eyes, Vision Changes, Glasses / Contacts. Ear/Nose/Mouth/Throat Denies  complaints or symptoms of Chronic sinus problems or rhinitis. Respiratory Denies complaints or symptoms of Chronic or frequent coughs, Shortness of Breath. Cardiovascular HYPERLIPIDEMIA, CORONARY [address] [place], [place] ([id number]) [id number] Page 4 of 7 Gastrointestinal GERD Genitourinary Denies complaints or symptoms of Frequent urination, urinary incontinence Integumentary (Skin) Complains or has symptoms of Wounds. Oncologic breast ca Psychiatric Complains or has symptoms of Claustrophobia, mood disorder, depression Objective Constitutional respirations regular, non-labored and within target range for patient.. Vitals Time Taken: 8:02 AM, Temperature: 98.3 F, Pulse: 88 bpm, Respiratory Rate: 17 breaths/min, Blood Pressure: 125/72 mmHg, Capillary Blood Glucose: 111 mg/dl. Psychiatric pleasant and cooperative. General Notes: T the right side postmastectomy surgical site  there is wound dehiscence to the lateral aspect with granulation tissue at the opening and o undermining circumferentially. No signs of surrounding infection including increased warmth, erythema or purulent drainage. Integumentary (Hair, Skin) Wound #1 status is Open. Original cause of wound was Gradually Appeared. The date acquired was: 10/14/2022. The wound is located on the Right Axilla. The wound measures 3.3cm length x 3.2cm width x 1.5cm depth; 8.294cm^2 area and 12.441cm^3 volume. There is Fat Layer (Subcutaneous Tissue) exposed. There is no tunneling noted, however, there is undermining starting at 9:00 and ending at 3:00 with a maximum distance of 2.5cm. There is a medium amount of serosanguineous drainage noted. The wound margin is distinct with the outline attached to the wound base. There is large (67-100%) red, pink granulation within the wound bed. There is a small (1-33%) amount of necrotic tissue within the wound bed including Adherent Slough. The periwound skin appearance did not exhibit: Callus, Crepitus, Excoriation, Induration, Rash, Scarring, Dry/Scaly, Maceration, Atrophie Blanche, Cyanosis, Ecchymosis, Hemosiderin Staining, Mottled, Pallor, Rubor, Erythema. Periwound temperature was noted as No Abnormality. The periwound has tenderness on palpation. Assessment Active Problems ICD-10 Unspecified open wound of right breast, initial encounter Malignant neoplasm of upper-outer quadrant of right female breast Type 2 diabetes mellitus with other skin ulcer Patient presents with a 31-month history of nonhealing ulcer to the right side status postmastectomy from surgical wound dehiscence. No signs of infection. While she did not have radiation or implants placed she does have a history of radiation to the wound site 27 years ago. This is likely going to delay wound healing as well. I recommended a wound VAC. While she waits for this to arrive she can use Vashe wet-to-dry dressings. We  will initiate the wound VAC in 2 weeks. She resides at [facility] and has a nurses that can change the wound VAC there. Plan Follow-up Appointments: Return Appointment in 2 weeks. - w/ Dr. Mikey Bussing and Maryruth Bun Rm # 9 Friday 01/07/23 @ 11:00 Other: - once you receive the wound vac, bring it into your next appointment with you. Once we apply the wound vac, The nurses at [facility] will begin changing the wound vac Monday's and Wednesday's and you'll come to the wound care center on Friday's. Bathing/ Shower/ Hygiene: May shower with protection but do not get wound dressing(s) wet. Protect dressing(s) with water repellant cover (for example, large plastic bag) or a cast cover and may then take shower. Negative Presssure Wound Therapy: Wound Vac to wound continuously at 117mm/hg pressure - Medela wound vac with white and black foam combination. Order today 12/24/22. Black and White Foam combination WOUND #1: - Axilla Wound Laterality: Right Cleanser: Vashe 5.8 (oz) 1 x Per Day/15 Days Discharge Instructions: Cleanse the wound with Vashe prior to applying a clean dressing using gauze sponges, not  tissue or cotton balls. Peri-Wound Care: Skin Prep (DME) (Generic) 1 x Per Day/15 Days Discharge Instructions: Use skin prep as directed Prim Dressing: vashe wet to dry 1 x Per Day/15 Days ary Secondary Dressing: Zetuvit Plus Silicone Border Dressing 5x5 (in/in) (DME) (Generic) 1 x Per Day/15 Days Discharge Instructions: Apply silicone border over primary dressing as directed. [place], [place] ([id number]) [id number] Page 5 of 7 1. Order Wound VAC 2. Vashe wet-to-dry dressing 3. Follow-up in 2 weeks Electronic Signature(s) Signed: 12/24/2022 12:25:56 PM By: Geralyn Corwin DO Entered By: Geralyn Corwin on 12/24/2022 09:20:25 -------------------------------------------------------------------------------- HxROS Details Patient Name: Date of Service: Karina Dus.  12/24/2022 8:00 A M Medical Record Number: [id number] Patient Account Number: [id number] Date of Birth/Sex: Treating RN: 27-May-1941 (82 y.o. Karina Foster Primary Care Provider: Hoyle Sauer Other Clinician: Referring Provider: Treating Provider/Extender: Jodie Echevaria, Ravisankar R Weeks in Treatment: 0 Information Obtained From Patient Chart Constitutional Symptoms (General Health) Complaints and Symptoms: Negative for: Fatigue; Fever; Chills; Marked Weight Change Eyes Complaints and Symptoms: Negative for: Dry Eyes; Vision Changes; Glasses / Contacts Ear/Nose/Mouth/Throat Complaints and Symptoms: Negative for: Chronic sinus problems or rhinitis Respiratory Complaints and Symptoms: Negative for: Chronic or frequent coughs; Shortness of Breath Medical History: Positive for: Sleep Apnea Genitourinary Complaints and Symptoms: Negative for: Frequent urination Review of System Notes: urinary incontinence Integumentary (Skin) Complaints and Symptoms: Positive for: Wounds Psychiatric Complaints and Symptoms: Positive for: Claustrophobia Review of System Notes: mood disorder, depression Hematologic/Lymphatic Cardiovascular Complaints and Symptoms: Review of System Notes: HYPERLIPIDEMIA, CORONARY STENT PLACEMENT Medical History: Positive for: Coronary Artery Disease; Myocardial Infarction [place] ([id number]) [phone].pdf Page 6 of 7 Gastrointestinal Complaints and Symptoms: Review of System Notes: GERD Endocrine Medical History: Positive for: Type II Diabetes Treated with: Insulin Immunological Neurologic Medical History: Positive for: Neuropathy - HANDS and feet Oncologic Complaints and Symptoms: Review of System Notes: breast ca Medical History: Positive for: Received Chemotherapy - right breast; Received Radiation - right breat ca Immunizations Pneumococcal Vaccine: Received Pneumococcal Vaccination:  Yes Received Pneumococcal Vaccination On or After 60th Birthday: Yes Implantable Devices Yes Hospitalization / Surgery History Type of Hospitalization/Surgery right total mastectomy portacath placement breast biopsy right cataract extraction coronary angioplasty with stent placement Family and Social History Unknown History: Yes; Former smoker; Marital Status - Married; Alcohol Use: Never; Drug Use: No History; Caffeine Use: Rarely; Financial Concerns: No; Food, Clothing or Shelter Needs: No; Support System Lacking: No; Transportation Concerns: No Electronic Signature(s) Signed: 12/24/2022 11:19:08 AM By: Fonnie Mu RN Signed: 12/24/2022 12:25:56 PM By: Geralyn Corwin DO Entered By: Fonnie Mu on 12/24/2022 08:06:27 -------------------------------------------------------------------------------- SuperBill Details Patient Name: Date of Service: Karina Dus 12/24/2022 Medical Record Number: [id number] Patient Account Number: [id number] Date of Birth/Sex: Treating RN: [birth year] (82 y.o. Karina Foster Primary Care Provider: Hoyle Sauer Other Clinician: Referring Provider: Treating Provider/Extender: Vevelyn Royals Weeks in Treatment: 0 Diagnosis Coding ICD-10 Codes Code Description C50.411 Malignant neoplasm of upper-outer quadrant of right female breast E11.622 Type 2 diabetes mellitus with other skin ulcer S21.001A Unspecified open wound of right breast, initial encounter [place], [place] ([id number]) [phone].pdf Page 7 of 7 Facility Procedures : CPT4 Code: [id number] Description: 99213 - WOUND CARE VISIT-LEV 3 EST PT Modifier: Quantity: 1 Physician Procedures : CPT4 Code Description Modifier [id number] - WC PHYS LEVEL 4 - NEW PT ICD-10 Diagnosis Description C50.411 Malignant neoplasm of upper-outer quadrant of right female breast E11.622 Type 2 diabetes mellitus with other skin  ulcer S21.001A  Unspecified  open wound of right breast, initial encounter Quantity: 1 Electronic Signature(s) Signed: 12/24/2022 12:25:56 PM By: Geralyn Corwin DO Entered By: Geralyn Corwin on 12/24/2022 09:20:38

## 2022-12-28 DIAGNOSIS — A4902 Methicillin resistant Staphylococcus aureus infection, unspecified site: Secondary | ICD-10-CM | POA: Diagnosis not present

## 2022-12-28 DIAGNOSIS — R2681 Unsteadiness on feet: Secondary | ICD-10-CM | POA: Diagnosis not present

## 2022-12-28 DIAGNOSIS — M6281 Muscle weakness (generalized): Secondary | ICD-10-CM | POA: Diagnosis not present

## 2022-12-28 DIAGNOSIS — R2689 Other abnormalities of gait and mobility: Secondary | ICD-10-CM | POA: Diagnosis not present

## 2022-12-28 DIAGNOSIS — R1312 Dysphagia, oropharyngeal phase: Secondary | ICD-10-CM | POA: Diagnosis not present

## 2022-12-28 DIAGNOSIS — Z853 Personal history of malignant neoplasm of breast: Secondary | ICD-10-CM | POA: Diagnosis not present

## 2022-12-30 ENCOUNTER — Encounter: Payer: Self-pay | Admitting: *Deleted

## 2023-01-03 DIAGNOSIS — R2681 Unsteadiness on feet: Secondary | ICD-10-CM | POA: Diagnosis not present

## 2023-01-03 DIAGNOSIS — M6281 Muscle weakness (generalized): Secondary | ICD-10-CM | POA: Diagnosis not present

## 2023-01-03 DIAGNOSIS — A4902 Methicillin resistant Staphylococcus aureus infection, unspecified site: Secondary | ICD-10-CM | POA: Diagnosis not present

## 2023-01-03 DIAGNOSIS — R1312 Dysphagia, oropharyngeal phase: Secondary | ICD-10-CM | POA: Diagnosis not present

## 2023-01-03 DIAGNOSIS — R2689 Other abnormalities of gait and mobility: Secondary | ICD-10-CM | POA: Diagnosis not present

## 2023-01-03 DIAGNOSIS — Z853 Personal history of malignant neoplasm of breast: Secondary | ICD-10-CM | POA: Diagnosis not present

## 2023-01-06 DIAGNOSIS — Z853 Personal history of malignant neoplasm of breast: Secondary | ICD-10-CM | POA: Diagnosis not present

## 2023-01-06 DIAGNOSIS — M6281 Muscle weakness (generalized): Secondary | ICD-10-CM | POA: Diagnosis not present

## 2023-01-06 DIAGNOSIS — A4902 Methicillin resistant Staphylococcus aureus infection, unspecified site: Secondary | ICD-10-CM | POA: Diagnosis not present

## 2023-01-07 ENCOUNTER — Encounter (HOSPITAL_BASED_OUTPATIENT_CLINIC_OR_DEPARTMENT_OTHER): Payer: Medicare Other | Attending: Internal Medicine | Admitting: Internal Medicine

## 2023-01-07 DIAGNOSIS — Z09 Encounter for follow-up examination after completed treatment for conditions other than malignant neoplasm: Secondary | ICD-10-CM | POA: Insufficient documentation

## 2023-01-07 DIAGNOSIS — C50411 Malignant neoplasm of upper-outer quadrant of right female breast: Secondary | ICD-10-CM | POA: Diagnosis not present

## 2023-01-07 DIAGNOSIS — E1140 Type 2 diabetes mellitus with diabetic neuropathy, unspecified: Secondary | ICD-10-CM | POA: Diagnosis not present

## 2023-01-07 DIAGNOSIS — I2510 Atherosclerotic heart disease of native coronary artery without angina pectoris: Secondary | ICD-10-CM | POA: Insufficient documentation

## 2023-01-07 DIAGNOSIS — T8131XA Disruption of external operation (surgical) wound, not elsewhere classified, initial encounter: Secondary | ICD-10-CM | POA: Diagnosis not present

## 2023-01-07 DIAGNOSIS — Z08 Encounter for follow-up examination after completed treatment for malignant neoplasm: Secondary | ICD-10-CM | POA: Diagnosis not present

## 2023-01-07 DIAGNOSIS — Z9221 Personal history of antineoplastic chemotherapy: Secondary | ICD-10-CM | POA: Diagnosis not present

## 2023-01-07 DIAGNOSIS — I252 Old myocardial infarction: Secondary | ICD-10-CM | POA: Diagnosis not present

## 2023-01-07 DIAGNOSIS — Z853 Personal history of malignant neoplasm of breast: Secondary | ICD-10-CM | POA: Diagnosis not present

## 2023-01-07 DIAGNOSIS — S21001A Unspecified open wound of right breast, initial encounter: Secondary | ICD-10-CM

## 2023-01-07 DIAGNOSIS — E11622 Type 2 diabetes mellitus with other skin ulcer: Secondary | ICD-10-CM

## 2023-01-07 DIAGNOSIS — Z923 Personal history of irradiation: Secondary | ICD-10-CM | POA: Insufficient documentation

## 2023-01-07 DIAGNOSIS — Z87891 Personal history of nicotine dependence: Secondary | ICD-10-CM | POA: Insufficient documentation

## 2023-01-07 DIAGNOSIS — G4730 Sleep apnea, unspecified: Secondary | ICD-10-CM | POA: Diagnosis not present

## 2023-01-07 DIAGNOSIS — X58XXXA Exposure to other specified factors, initial encounter: Secondary | ICD-10-CM | POA: Diagnosis not present

## 2023-01-07 DIAGNOSIS — Z9011 Acquired absence of right breast and nipple: Secondary | ICD-10-CM | POA: Diagnosis not present

## 2023-01-08 NOTE — Progress Notes (Signed)
Patient Care Team: Chilton Greathouse, MD as PCP - General (Internal Medicine) Chilton Greathouse, MD as Consulting Physician (Internal Medicine) Serena Croissant, MD as Consulting Physician (Hematology and Oncology) Pershing Proud, RN as Oncology Nurse Navigator Donnelly Angelica, RN as Oncology Nurse Navigator  DIAGNOSIS:  Encounter Diagnosis  Name Primary?   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 female is here because of recent diagnosis of right breast cancer. She presents to the clinic for a follow-up.  She has issues with the wound healing that have not resolved.  She was improving very well with the wound VAC but it broke and she is waiting for another wound VAC to be provided to her currently later today.  Does not have any fevers or chills.   ALLERGIES:  is allergic to azithromycin and demerol [meperidine].  MEDICATIONS:  Current Outpatient Medications  Medication Sig Dispense Refill   acetaminophen (TYLENOL) 500 MG tablet Take 2 tablets (1,000 mg total) by mouth every 6 (six) hours. (Patient taking differently: Take 1,000 mg by mouth 2 (two) times daily.) 30  tablet 0   aspirin 325 MG tablet Take 325 mg by mouth daily.     BD PEN NEEDLE NANO U/F 32G X 4 MM MISC SMARTSIG:2 Pen Needle SUB-Q Daily     Bempedoic Acid-Ezetimibe (NEXLIZET) 180-10 MG TABS Take 1 tablet by mouth daily. (Patient taking differently: Take 1 tablet by mouth every other day.) 90 tablet 3   Cholecalciferol (VITAMIN D3) 25 MCG (1000 UT) tablet Take 1,000 Units by mouth daily.     citalopram (CELEXA) 40 MG tablet Take 40 mg by mouth daily.     Continuous Blood Gluc Sensor (FREESTYLE LIBRE 14 DAY SENSOR) MISC Apply topically every 14 (fourteen) days.     Cranberry Soft 500 MG CHEW Chew 500 mg by mouth daily.     fluocinonide gel (LIDEX) 0.05 % Apply 1 Application topically daily as needed (irritation on tongue).     ibuprofen (ADVIL) 200 MG tablet Take 200 mg by mouth every 6 (six) hours as needed for moderate pain.     Insulin Aspart FlexPen (NOVOLOG) 100 UNIT/ML 30 Units.     lidocaine (XYLOCAINE) 2 % solution Use as directed 15 mLs in the mouth or throat every 6 (six) hours as needed for mouth pain.     lidocaine-prilocaine (EMLA) cream Apply to affected area once (Patient taking differently: Apply 1 Application topically as needed (port). Apply to affected area once) 30 g 3   magic mouthwash (lidocaine, diphenhydrAMINE, alum & mag hydroxide) suspension Swish and spit 5 mLs by mouth 4 (four) times daily as needed for mouth pain. 240 mL 1  metoprolol tartrate (LOPRESSOR) 25 MG tablet TAKE 1/2 TABLET TWICE DAILY 180 tablet 3   Multiple Vitamins-Minerals (PRESERVISION AREDS) CAPS Take 1 capsule by mouth in the morning and at bedtime.     nitroGLYCERIN (NITROSTAT) 0.4 MG SL tablet Place 0.4 mg under the tongue every 5 (five) minutes as needed for chest pain.     ramipril (ALTACE) 2.5 MG capsule Take 2.5 mg by mouth daily.     Sodium Chloride-Sodium Bicarb (SODIUM BICARBONATE/SODIUM CHLORIDE) SOLN Swish and spit as needed for mouth pain     tiZANidine (ZANAFLEX) 2 MG tablet Take 1  tablet (2 mg total) by mouth every 8 (eight) hours as needed for muscle spasms. (Patient not taking: Reported on 12/23/2022) 20 tablet 1   TRESIBA FLEXTOUCH 200 UNIT/ML FlexTouch Pen Inject 32 Units into the skin daily after lunch. We've reduced your insulin dose, please follow up with your PCP outpatient and adjust this further as needed. (Patient taking differently: Inject 64 Units into the skin daily after lunch. We've reduced your insulin dose, please follow up with your PCP outpatient and adjust this further as needed.)     valACYclovir (VALTREX) 500 MG tablet Take 1 tablet (500 mg total) by mouth 2 (two) times daily. 60 tablet 0   vitamin B-12 (CYANOCOBALAMIN) 1000 MCG tablet Take 1,000 mcg by mouth daily.     No current facility-administered medications for this visit.    PHYSICAL EXAMINATION: ECOG PERFORMANCE STATUS: 1 - Symptomatic but completely ambulatory  Vitals:   01/14/23 1033  BP: (!) 120/59  Pulse: 91  Resp: 16  Temp: 98.1 F (36.7 C)  SpO2: 99%   Filed Weights   01/14/23 1033  Weight: 152 lb 8 oz (69.2 kg)     LABORATORY DATA:  I have reviewed the data as listed    Latest Ref Rng & Units 01/14/2023    9:57 AM 12/24/2022    9:50 AM 12/02/2022   11:02 AM  CMP  Glucose 70 - 99 mg/dL 161  096  045   BUN 8 - 23 mg/dL 17  21  21    Creatinine 0.44 - 1.00 mg/dL 4.09  8.11  9.14   Sodium 135 - 145 mmol/L 139  136  139   Potassium 3.5 - 5.1 mmol/L 3.8  4.5  3.7   Chloride 98 - 111 mmol/L 103  103  102   CO2 22 - 32 mmol/L 26  25  27    Calcium 8.9 - 10.3 mg/dL 9.5  9.8  9.1   Total Protein 6.5 - 8.1 g/dL 7.8  7.5  7.0   Total Bilirubin 0.3 - 1.2 mg/dL 0.7  0.4  0.3   Alkaline Phos 38 - 126 U/L 49  44  60   AST 15 - 41 U/L 15  19  11    ALT 0 - 44 U/L 16  15  12      Lab Results  Component Value Date   WBC 6.3 01/14/2023   HGB 11.9 (L) 01/14/2023   HCT 34.7 (L) 01/14/2023   MCV 95.3 01/14/2023   PLT 269 01/14/2023   NEUTROABS 4.4 01/14/2023    ASSESSMENT &  PLAN:  Malignant neoplasm of upper-outer quadrant of right breast in female, estrogen receptor positive (HCC) 10/14/2022:Right mastectomy: 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 no role of antiestrogen therapy since her repeat prognostic panel was triple negative -------------------------------------------------------------------------------------------------------------------------------------------- Current treatment: Cycle 3 CMF (on hold for recovery from recent wound infection) Chemo toxicities: Hospitalization 11/21/2022-11/26/2022: Cellulitis with neutropenic fever Mouth sore: Currently on Valtrex    Wound healing: Supposedly the wound was getting better with the help of wound VAC but it broke down and try to get her a new one.  We cannot reinitiate her chemotherapy until the wound has healed.   We will try to reassess her on 31st to see if she can receive the next chemo on that day.    No orders of the defined types were placed in this encounter.  The patient has a good understanding of the overall plan. she agrees with it. she will call with any problems that may develop before the next visit here. Total time spent: 30 mins including face to face time and time spent for planning, charting and co-ordination of care   Tamsen Meek, MD 01/14/23    I Janan Ridge am acting as a Neurosurgeon for [employer]  I have reviewed the above documentation for accuracy and completeness, and I agree with the above.

## 2023-01-10 DIAGNOSIS — A4902 Methicillin resistant Staphylococcus aureus infection, unspecified site: Secondary | ICD-10-CM | POA: Diagnosis not present

## 2023-01-10 DIAGNOSIS — M6281 Muscle weakness (generalized): Secondary | ICD-10-CM | POA: Diagnosis not present

## 2023-01-10 DIAGNOSIS — Z853 Personal history of malignant neoplasm of breast: Secondary | ICD-10-CM | POA: Diagnosis not present

## 2023-01-12 DIAGNOSIS — I2510 Atherosclerotic heart disease of native coronary artery without angina pectoris: Secondary | ICD-10-CM | POA: Diagnosis not present

## 2023-01-12 DIAGNOSIS — E1149 Type 2 diabetes mellitus with other diabetic neurological complication: Secondary | ICD-10-CM | POA: Diagnosis not present

## 2023-01-12 DIAGNOSIS — Z794 Long term (current) use of insulin: Secondary | ICD-10-CM | POA: Diagnosis not present

## 2023-01-12 DIAGNOSIS — I10 Essential (primary) hypertension: Secondary | ICD-10-CM | POA: Diagnosis not present

## 2023-01-13 MED FILL — Fosaprepitant Dimeglumine For IV Infusion 150 MG (Base Eq): INTRAVENOUS | Qty: 5 | Status: AC

## 2023-01-13 MED FILL — Dexamethasone Sodium Phosphate Inj 100 MG/10ML: INTRAMUSCULAR | Qty: 1 | Status: AC

## 2023-01-14 ENCOUNTER — Other Ambulatory Visit: Payer: Self-pay

## 2023-01-14 ENCOUNTER — Inpatient Hospital Stay: Payer: Medicare Other | Attending: Hematology and Oncology | Admitting: Hematology and Oncology

## 2023-01-14 ENCOUNTER — Inpatient Hospital Stay: Payer: Medicare Other

## 2023-01-14 VITALS — BP 120/59 | HR 91 | Temp 98.1°F | Resp 16 | Wt 152.5 lb

## 2023-01-14 DIAGNOSIS — Z95828 Presence of other vascular implants and grafts: Secondary | ICD-10-CM

## 2023-01-14 DIAGNOSIS — Z170 Estrogen receptor positive status [ER+]: Secondary | ICD-10-CM | POA: Diagnosis not present

## 2023-01-14 DIAGNOSIS — Z9011 Acquired absence of right breast and nipple: Secondary | ICD-10-CM | POA: Insufficient documentation

## 2023-01-14 DIAGNOSIS — Z885 Allergy status to narcotic agent status: Secondary | ICD-10-CM | POA: Insufficient documentation

## 2023-01-14 DIAGNOSIS — L03313 Cellulitis of chest wall: Secondary | ICD-10-CM | POA: Diagnosis not present

## 2023-01-14 DIAGNOSIS — C50411 Malignant neoplasm of upper-outer quadrant of right female breast: Secondary | ICD-10-CM | POA: Insufficient documentation

## 2023-01-14 DIAGNOSIS — Z881 Allergy status to other antibiotic agents status: Secondary | ICD-10-CM | POA: Insufficient documentation

## 2023-01-14 LAB — CMP (CANCER CENTER ONLY)
ALT: 16 U/L (ref 0–44)
AST: 15 U/L (ref 15–41)
Albumin: 4.4 g/dL (ref 3.5–5.0)
Alkaline Phosphatase: 49 U/L (ref 38–126)
Anion gap: 10 (ref 5–15)
BUN: 17 mg/dL (ref 8–23)
CO2: 26 mmol/L (ref 22–32)
Calcium: 9.5 mg/dL (ref 8.9–10.3)
Chloride: 103 mmol/L (ref 98–111)
Creatinine: 0.8 mg/dL (ref 0.44–1.00)
GFR, Estimated: >60 mL/min (ref >60)
Glucose, Bld: 174 mg/dL — ABNORMAL HIGH (ref 70–99)
Potassium: 3.8 mmol/L (ref 3.5–5.1)
Sodium: 139 mmol/L (ref 135–145)
Total Bilirubin: 0.7 mg/dL (ref 0.3–1.2)
Total Protein: 7.8 g/dL (ref 6.5–8.1)

## 2023-01-14 LAB — CBC WITH DIFFERENTIAL (CANCER CENTER ONLY)
Abs Immature Granulocytes: 0.03 10*3/uL (ref 0.00–0.07)
Basophils Absolute: 0 10*3/uL (ref 0.0–0.1)
Basophils Relative: 1 %
Eosinophils Absolute: 0.4 10*3/uL (ref 0.0–0.5)
Eosinophils Relative: 7 %
HCT: 34.7 % — ABNORMAL LOW (ref 36.0–46.0)
Hemoglobin: 11.9 g/dL — ABNORMAL LOW (ref 12.0–15.0)
Immature Granulocytes: 1 %
Lymphocytes Relative: 13 %
Lymphs Abs: 0.8 10*3/uL (ref 0.7–4.0)
MCH: 32.7 pg (ref 26.0–34.0)
MCHC: 34.3 g/dL (ref 30.0–36.0)
MCV: 95.3 fL (ref 80.0–100.0)
Monocytes Absolute: 0.7 10*3/uL (ref 0.1–1.0)
Monocytes Relative: 11 %
Neutro Abs: 4.4 10*3/uL (ref 1.7–7.7)
Neutrophils Relative %: 67 %
Platelet Count: 269 10*3/uL (ref 150–400)
RBC: 3.64 MIL/uL — ABNORMAL LOW (ref 3.87–5.11)
RDW: 14.9 % (ref 11.5–15.5)
WBC Count: 6.3 10*3/uL (ref 4.0–10.5)
nRBC: 0 % (ref 0.0–0.2)

## 2023-01-14 MED ORDER — SODIUM CHLORIDE 0.9% FLUSH
10.0000 mL | Freq: Once | INTRAVENOUS | Status: AC
  Administered 2023-01-14: 10 mL

## 2023-01-14 MED ORDER — INSULIN LISPRO 100 UNIT/ML IJ SOLN: 30.0000 [IU] | Freq: Three times a day (TID) | INTRAMUSCULAR | 11 refills | Status: DC

## 2023-01-14 NOTE — Assessment & Plan Note (Signed)
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 no role of antiestrogen therapy since her repeat prognostic panel was triple negative -------------------------------------------------------------------------------------------------------------------------------------------- Current treatment: Cycle 3 CMF (on hold for recovery from recent wound infection) Chemo toxicities: Hospitalization 11/21/2022-11/26/2022: Cellulitis with neutropenic fever Mouth sore: Currently on Valtrex   Wound healing:   Return to clinic in 3 weeks for cycle 4

## 2023-01-17 DIAGNOSIS — A4902 Methicillin resistant Staphylococcus aureus infection, unspecified site: Secondary | ICD-10-CM | POA: Diagnosis not present

## 2023-01-17 DIAGNOSIS — Z853 Personal history of malignant neoplasm of breast: Secondary | ICD-10-CM | POA: Diagnosis not present

## 2023-01-17 DIAGNOSIS — M6281 Muscle weakness (generalized): Secondary | ICD-10-CM | POA: Diagnosis not present

## 2023-01-18 ENCOUNTER — Encounter (HOSPITAL_BASED_OUTPATIENT_CLINIC_OR_DEPARTMENT_OTHER): Payer: Medicare Other | Admitting: Internal Medicine

## 2023-01-18 DIAGNOSIS — S21001A Unspecified open wound of right breast, initial encounter: Secondary | ICD-10-CM

## 2023-01-18 DIAGNOSIS — Z853 Personal history of malignant neoplasm of breast: Secondary | ICD-10-CM | POA: Diagnosis not present

## 2023-01-18 DIAGNOSIS — T8131XA Disruption of external operation (surgical) wound, not elsewhere classified, initial encounter: Secondary | ICD-10-CM | POA: Diagnosis not present

## 2023-01-18 DIAGNOSIS — Z9011 Acquired absence of right breast and nipple: Secondary | ICD-10-CM | POA: Diagnosis not present

## 2023-01-18 DIAGNOSIS — E11622 Type 2 diabetes mellitus with other skin ulcer: Secondary | ICD-10-CM

## 2023-01-18 DIAGNOSIS — C50411 Malignant neoplasm of upper-outer quadrant of right female breast: Secondary | ICD-10-CM | POA: Diagnosis not present

## 2023-01-18 DIAGNOSIS — Z08 Encounter for follow-up examination after completed treatment for malignant neoplasm: Secondary | ICD-10-CM | POA: Diagnosis not present

## 2023-01-18 DIAGNOSIS — E1140 Type 2 diabetes mellitus with diabetic neuropathy, unspecified: Secondary | ICD-10-CM | POA: Diagnosis not present

## 2023-01-19 DIAGNOSIS — A4902 Methicillin resistant Staphylococcus aureus infection, unspecified site: Secondary | ICD-10-CM | POA: Diagnosis not present

## 2023-01-19 DIAGNOSIS — Z853 Personal history of malignant neoplasm of breast: Secondary | ICD-10-CM | POA: Diagnosis not present

## 2023-01-19 DIAGNOSIS — M6281 Muscle weakness (generalized): Secondary | ICD-10-CM | POA: Diagnosis not present

## 2023-01-24 DIAGNOSIS — M6281 Muscle weakness (generalized): Secondary | ICD-10-CM | POA: Diagnosis not present

## 2023-01-24 DIAGNOSIS — A4902 Methicillin resistant Staphylococcus aureus infection, unspecified site: Secondary | ICD-10-CM | POA: Diagnosis not present

## 2023-01-24 DIAGNOSIS — Z853 Personal history of malignant neoplasm of breast: Secondary | ICD-10-CM | POA: Diagnosis not present

## 2023-01-25 ENCOUNTER — Encounter (HOSPITAL_BASED_OUTPATIENT_CLINIC_OR_DEPARTMENT_OTHER): Payer: Medicare Other | Admitting: Internal Medicine

## 2023-01-25 DIAGNOSIS — E11622 Type 2 diabetes mellitus with other skin ulcer: Secondary | ICD-10-CM | POA: Diagnosis not present

## 2023-01-25 DIAGNOSIS — T8131XA Disruption of external operation (surgical) wound, not elsewhere classified, initial encounter: Secondary | ICD-10-CM | POA: Diagnosis not present

## 2023-01-25 DIAGNOSIS — S21001A Unspecified open wound of right breast, initial encounter: Secondary | ICD-10-CM

## 2023-01-25 DIAGNOSIS — E1140 Type 2 diabetes mellitus with diabetic neuropathy, unspecified: Secondary | ICD-10-CM | POA: Diagnosis not present

## 2023-01-25 DIAGNOSIS — Z08 Encounter for follow-up examination after completed treatment for malignant neoplasm: Secondary | ICD-10-CM | POA: Diagnosis not present

## 2023-01-25 DIAGNOSIS — Z9011 Acquired absence of right breast and nipple: Secondary | ICD-10-CM | POA: Diagnosis not present

## 2023-01-25 DIAGNOSIS — Z853 Personal history of malignant neoplasm of breast: Secondary | ICD-10-CM | POA: Diagnosis not present

## 2023-01-26 DIAGNOSIS — M6281 Muscle weakness (generalized): Secondary | ICD-10-CM | POA: Diagnosis not present

## 2023-01-26 DIAGNOSIS — Z853 Personal history of malignant neoplasm of breast: Secondary | ICD-10-CM | POA: Diagnosis not present

## 2023-01-26 DIAGNOSIS — A4902 Methicillin resistant Staphylococcus aureus infection, unspecified site: Secondary | ICD-10-CM | POA: Diagnosis not present

## 2023-01-30 NOTE — Progress Notes (Signed)
Patient Care Team: Chilton Greathouse, MD as PCP - General (Internal Medicine) Chilton Greathouse, MD as Consulting Physician (Internal Medicine) Serena Croissant, MD as Consulting Physician (Hematology and Oncology) Pershing Proud, RN as Oncology Nurse Navigator Donnelly Angelica, RN as Oncology Nurse Navigator  DIAGNOSIS:  Encounter Diagnosis  Name Primary?   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 female is here because of recent diagnosis of right breast cancer. She presents to the clinic for a follow-up. She reports the wound infection is improving. She says she feels pretty good.  She is unfortunately lost a significant amount of her hair.  She is using a wig.   ALLERGIES:  is allergic to azithromycin and demerol [meperidine].  MEDICATIONS:  Current Outpatient Medications  Medication Sig Dispense Refill   acetaminophen (TYLENOL) 500 MG tablet Take 2 tablets (1,000 mg total) by mouth every 6 (six) hours. (Patient taking differently: Take 1,000 mg by mouth 2 (two) times daily.) 30 tablet 0   aspirin 325 MG tablet Take 325 mg by mouth daily.     BD PEN NEEDLE NANO  U/F 32G X 4 MM MISC SMARTSIG:2 Pen Needle SUB-Q Daily     Bempedoic Acid-Ezetimibe (NEXLIZET) 180-10 MG TABS Take 1 tablet by mouth daily. (Patient taking differently: Take 1 tablet by mouth every other day.) 90 tablet 3   Cholecalciferol (VITAMIN D3) 25 MCG (1000 UT) tablet Take 1,000 Units by mouth daily.     citalopram (CELEXA) 40 MG tablet Take 40 mg by mouth daily.     Continuous Blood Gluc Sensor (FREESTYLE LIBRE 14 DAY SENSOR) MISC Apply topically every 14 (fourteen) days.     Cranberry Soft 500 MG CHEW Chew 500 mg by mouth daily.     fluocinonide gel (LIDEX) 0.05 % Apply 1 Application topically daily as needed (irritation on tongue).     ibuprofen (ADVIL) 200 MG tablet Take 200 mg by mouth every 6 (six) hours as needed for moderate pain.     insulin lispro (HUMALOG) 100 UNIT/ML injection Inject 0.3 mLs (30 Units total) into the skin 3 (three) times daily before meals. 10 mL 11   lidocaine (XYLOCAINE) 2 % solution Use as directed 15 mLs in the mouth or throat every 6 (six) hours as needed for mouth pain.     lidocaine-prilocaine (EMLA) cream Apply to affected area once (Patient taking differently: Apply 1 Application topically as needed (port). Apply to affected area once) 30 g 3   magic mouthwash (lidocaine, diphenhydrAMINE, alum & mag hydroxide) suspension Swish and spit 5 mLs by mouth 4 (four) times daily as needed for mouth pain. 240 mL 1   metoprolol tartrate (LOPRESSOR) 25  MG tablet TAKE 1/2 TABLET TWICE DAILY 180 tablet 3   Multiple Vitamins-Minerals (PRESERVISION AREDS) CAPS Take 1 capsule by mouth in the morning and at bedtime.     nitroGLYCERIN (NITROSTAT) 0.4 MG SL tablet Place 0.4 mg under the tongue every 5 (five) minutes as needed for chest pain.     ramipril (ALTACE) 2.5 MG capsule Take 2.5 mg by mouth daily.     Sodium Chloride-Sodium Bicarb (SODIUM BICARBONATE/SODIUM CHLORIDE) SOLN Swish and spit as needed for mouth pain     TRESIBA FLEXTOUCH 200 UNIT/ML FlexTouch Pen Inject  32 Units into the skin daily after lunch. We've reduced your insulin dose, please follow up with your PCP outpatient and adjust this further as needed. (Patient taking differently: Inject 64 Units into the skin daily after lunch. We've reduced your insulin dose, please follow up with your PCP outpatient and adjust this further as needed.)     valACYclovir (VALTREX) 500 MG tablet Take 1 tablet (500 mg total) by mouth 2 (two) times daily. 60 tablet 0   vitamin B-12 (CYANOCOBALAMIN) 1000 MCG tablet Take 1,000 mcg by mouth daily.     tiZANidine (ZANAFLEX) 2 MG tablet Take 1 tablet (2 mg total) by mouth every 8 (eight) hours as needed for muscle spasms. (Patient not taking: Reported on 12/23/2022) 20 tablet 1   No current facility-administered medications for this visit.    PHYSICAL EXAMINATION: ECOG PERFORMANCE STATUS: 1 - Symptomatic but completely ambulatory  Vitals:   02/04/23 1041  BP: 115/87  Pulse: 86  Resp: 18  Temp: 97.8 F (36.6 C)  SpO2: 100%   Filed Weights   02/04/23 1041  Weight: 154 lb 11.2 oz (70.2 kg)      LABORATORY DATA:  I have reviewed the data as listed    Latest Ref Rng & Units 01/14/2023    9:57 AM 12/24/2022    9:50 AM 12/02/2022   11:02 AM  CMP  Glucose 70 - 99 mg/dL 161  096  045   BUN 8 - 23 mg/dL 17  21  21    Creatinine 0.44 - 1.00 mg/dL 4.09  8.11  9.14   Sodium 135 - 145 mmol/L 139  136  139   Potassium 3.5 - 5.1 mmol/L 3.8  4.5  3.7   Chloride 98 - 111 mmol/L 103  103  102   CO2 22 - 32 mmol/L 26  25  27    Calcium 8.9 - 10.3 mg/dL 9.5  9.8  9.1   Total Protein 6.5 - 8.1 g/dL 7.8  7.5  7.0   Total Bilirubin 0.3 - 1.2 mg/dL 0.7  0.4  0.3   Alkaline Phos 38 - 126 U/L 49  44  60   AST 15 - 41 U/L 15  19  11    ALT 0 - 44 U/L 16  15  12      Lab Results  Component Value Date   WBC 5.2 02/04/2023   HGB 11.9 (L) 02/04/2023   HCT 35.9 (L) 02/04/2023   MCV 95.0 02/04/2023   PLT 264 02/04/2023   NEUTROABS 3.1 02/04/2023    ASSESSMENT & PLAN:   Malignant neoplasm of upper-outer quadrant of right breast in female, estrogen receptor positive (HCC) 10/14/2022:Right mastectomy: 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 no role of antiestrogen therapy since her repeat prognostic panel was triple negative -------------------------------------------------------------------------------------------------------------------------------------------- Current treatment: Cycle 3 CMF (on hold for recovery from recent wound infection) Chemo toxicities: Hospitalization 11/21/2022-11/26/2022: Cellulitis with neutropenic fever Mouth sore: Currently on Valtrex     Wound healing: Supposedly the wound was getting better with the help of wound VAC .  But it has not fully healed and the patient is very worried about going through more chemotherapy at this time.  In addition she is taking a 2-week vacation on the beach in July and does not want to initiate her treatment until she comes back from the beach vacation.  We will arrange for her to come back on 03/29/2023 to start her chemotherapy again.      Orders Placed This Encounter  Procedures   CBC with Differential (Cancer Center Only)    Standing Status:   Future    Standing Expiration Date:   03/28/2024   CMP (Cancer Center only)    Standing Status:   Future    Standing Expiration Date:   03/28/2024   The patient has a good understanding of the overall plan. she agrees with it. she will call with any problems that may develop before the next visit here. Total time spent: 30 mins including face to face time and time spent for planning, charting and co-ordination of care   Tamsen Meek, MD 02/04/23    I Janan Ridge am acting as a Neurosurgeon for [employer]  I have reviewed the above documentation for accuracy and completeness, and I agree with the above.

## 2023-02-01 ENCOUNTER — Encounter (HOSPITAL_BASED_OUTPATIENT_CLINIC_OR_DEPARTMENT_OTHER): Payer: Medicare Other | Admitting: Internal Medicine

## 2023-02-01 DIAGNOSIS — S21001A Unspecified open wound of right breast, initial encounter: Secondary | ICD-10-CM

## 2023-02-01 DIAGNOSIS — Z08 Encounter for follow-up examination after completed treatment for malignant neoplasm: Secondary | ICD-10-CM | POA: Diagnosis not present

## 2023-02-01 DIAGNOSIS — Z9011 Acquired absence of right breast and nipple: Secondary | ICD-10-CM | POA: Diagnosis not present

## 2023-02-01 DIAGNOSIS — Z853 Personal history of malignant neoplasm of breast: Secondary | ICD-10-CM | POA: Diagnosis not present

## 2023-02-01 DIAGNOSIS — T8131XA Disruption of external operation (surgical) wound, not elsewhere classified, initial encounter: Secondary | ICD-10-CM | POA: Diagnosis not present

## 2023-02-01 DIAGNOSIS — E1140 Type 2 diabetes mellitus with diabetic neuropathy, unspecified: Secondary | ICD-10-CM | POA: Diagnosis not present

## 2023-02-03 DIAGNOSIS — M6281 Muscle weakness (generalized): Secondary | ICD-10-CM | POA: Diagnosis not present

## 2023-02-03 DIAGNOSIS — Z853 Personal history of malignant neoplasm of breast: Secondary | ICD-10-CM | POA: Diagnosis not present

## 2023-02-03 DIAGNOSIS — A4902 Methicillin resistant Staphylococcus aureus infection, unspecified site: Secondary | ICD-10-CM | POA: Diagnosis not present

## 2023-02-03 MED FILL — Fosaprepitant Dimeglumine For IV Infusion 150 MG (Base Eq): INTRAVENOUS | Qty: 5 | Status: AC

## 2023-02-03 MED FILL — Dexamethasone Sodium Phosphate Inj 100 MG/10ML: INTRAMUSCULAR | Qty: 1 | Status: AC

## 2023-02-04 ENCOUNTER — Inpatient Hospital Stay: Payer: Medicare Other

## 2023-02-04 ENCOUNTER — Inpatient Hospital Stay (HOSPITAL_BASED_OUTPATIENT_CLINIC_OR_DEPARTMENT_OTHER): Payer: Medicare Other | Admitting: Hematology and Oncology

## 2023-02-04 VITALS — BP 115/87 | HR 86 | Temp 97.8°F | Resp 18 | Ht 67.0 in | Wt 154.7 lb

## 2023-02-04 DIAGNOSIS — Z95828 Presence of other vascular implants and grafts: Secondary | ICD-10-CM

## 2023-02-04 DIAGNOSIS — Z9011 Acquired absence of right breast and nipple: Secondary | ICD-10-CM | POA: Diagnosis not present

## 2023-02-04 DIAGNOSIS — C50411 Malignant neoplasm of upper-outer quadrant of right female breast: Secondary | ICD-10-CM

## 2023-02-04 DIAGNOSIS — Z881 Allergy status to other antibiotic agents status: Secondary | ICD-10-CM | POA: Diagnosis not present

## 2023-02-04 DIAGNOSIS — Z170 Estrogen receptor positive status [ER+]: Secondary | ICD-10-CM | POA: Diagnosis not present

## 2023-02-04 DIAGNOSIS — Z885 Allergy status to narcotic agent status: Secondary | ICD-10-CM | POA: Diagnosis not present

## 2023-02-04 DIAGNOSIS — L03313 Cellulitis of chest wall: Secondary | ICD-10-CM | POA: Diagnosis not present

## 2023-02-04 LAB — CBC WITH DIFFERENTIAL (CANCER CENTER ONLY)
Abs Immature Granulocytes: 0.01 10*3/uL (ref 0.00–0.07)
Basophils Absolute: 0 10*3/uL (ref 0.0–0.1)
Basophils Relative: 1 %
Eosinophils Absolute: 0.3 10*3/uL (ref 0.0–0.5)
Eosinophils Relative: 5 %
HCT: 35.9 % — ABNORMAL LOW (ref 36.0–46.0)
Hemoglobin: 11.9 g/dL — ABNORMAL LOW (ref 12.0–15.0)
Immature Granulocytes: 0 %
Lymphocytes Relative: 21 %
Lymphs Abs: 1.1 10*3/uL (ref 0.7–4.0)
MCH: 31.5 pg (ref 26.0–34.0)
MCHC: 33.1 g/dL (ref 30.0–36.0)
MCV: 95 fL (ref 80.0–100.0)
Monocytes Absolute: 0.7 10*3/uL (ref 0.1–1.0)
Monocytes Relative: 13 %
Neutro Abs: 3.1 10*3/uL (ref 1.7–7.7)
Neutrophils Relative %: 60 %
Platelet Count: 264 10*3/uL (ref 150–400)
RBC: 3.78 MIL/uL — ABNORMAL LOW (ref 3.87–5.11)
RDW: 13.9 % (ref 11.5–15.5)
WBC Count: 5.2 10*3/uL (ref 4.0–10.5)
nRBC: 0 % (ref 0.0–0.2)

## 2023-02-04 LAB — CMP (CANCER CENTER ONLY)
ALT: 18 U/L (ref 0–44)
AST: 16 U/L (ref 15–41)
Albumin: 4.4 g/dL (ref 3.5–5.0)
Alkaline Phosphatase: 47 U/L (ref 38–126)
Anion gap: 8 (ref 5–15)
BUN: 19 mg/dL (ref 8–23)
CO2: 26 mmol/L (ref 22–32)
Calcium: 9.5 mg/dL (ref 8.9–10.3)
Chloride: 103 mmol/L (ref 98–111)
Creatinine: 0.84 mg/dL (ref 0.44–1.00)
GFR, Estimated: >60 mL/min (ref >60)
Glucose, Bld: 220 mg/dL — ABNORMAL HIGH (ref 70–99)
Potassium: 4.4 mmol/L (ref 3.5–5.1)
Sodium: 137 mmol/L (ref 135–145)
Total Bilirubin: 0.4 mg/dL (ref 0.3–1.2)
Total Protein: 7.5 g/dL (ref 6.5–8.1)

## 2023-02-04 MED ORDER — SODIUM CHLORIDE 0.9% FLUSH
10.0000 mL | Freq: Once | INTRAVENOUS | Status: AC
  Administered 2023-02-04: 10 mL

## 2023-02-04 NOTE — Assessment & Plan Note (Addendum)
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 no role of antiestrogen therapy since her repeat prognostic panel was triple negative -------------------------------------------------------------------------------------------------------------------------------------------- Current treatment: Cycle 3 CMF (on hold for recovery from recent wound infection) Chemo toxicities: Hospitalization 11/21/2022-11/26/2022: Cellulitis with neutropenic fever Mouth sore: Currently on Valtrex     Wound healing: Supposedly the wound was getting better with the help of wound VAC .  But it has not fully healed and the patient is very worried about going through more chemotherapy at this time.  In addition she is taking a 2-week vacation on the beach in July and does not want to initiate her treatment until she comes back from the beach vacation.  We will arrange for her to come back on 03/29/2023 to start her chemotherapy again.

## 2023-02-08 ENCOUNTER — Encounter (HOSPITAL_BASED_OUTPATIENT_CLINIC_OR_DEPARTMENT_OTHER): Payer: Medicare Other | Attending: Internal Medicine | Admitting: Internal Medicine

## 2023-02-08 DIAGNOSIS — C50411 Malignant neoplasm of upper-outer quadrant of right female breast: Secondary | ICD-10-CM | POA: Insufficient documentation

## 2023-02-08 DIAGNOSIS — T8189XA Other complications of procedures, not elsewhere classified, initial encounter: Secondary | ICD-10-CM | POA: Diagnosis not present

## 2023-02-08 DIAGNOSIS — L98492 Non-pressure chronic ulcer of skin of other sites with fat layer exposed: Secondary | ICD-10-CM | POA: Diagnosis not present

## 2023-02-08 DIAGNOSIS — E1140 Type 2 diabetes mellitus with diabetic neuropathy, unspecified: Secondary | ICD-10-CM | POA: Diagnosis not present

## 2023-02-08 DIAGNOSIS — E11622 Type 2 diabetes mellitus with other skin ulcer: Secondary | ICD-10-CM | POA: Diagnosis not present

## 2023-02-08 DIAGNOSIS — I2510 Atherosclerotic heart disease of native coronary artery without angina pectoris: Secondary | ICD-10-CM | POA: Insufficient documentation

## 2023-02-09 ENCOUNTER — Encounter (HOSPITAL_BASED_OUTPATIENT_CLINIC_OR_DEPARTMENT_OTHER): Payer: Medicare Other | Admitting: Physician Assistant

## 2023-02-09 DIAGNOSIS — E1140 Type 2 diabetes mellitus with diabetic neuropathy, unspecified: Secondary | ICD-10-CM | POA: Diagnosis not present

## 2023-02-09 DIAGNOSIS — L98492 Non-pressure chronic ulcer of skin of other sites with fat layer exposed: Secondary | ICD-10-CM | POA: Diagnosis not present

## 2023-02-09 DIAGNOSIS — E11622 Type 2 diabetes mellitus with other skin ulcer: Secondary | ICD-10-CM | POA: Diagnosis not present

## 2023-02-09 DIAGNOSIS — I2510 Atherosclerotic heart disease of native coronary artery without angina pectoris: Secondary | ICD-10-CM | POA: Diagnosis not present

## 2023-02-09 DIAGNOSIS — C50411 Malignant neoplasm of upper-outer quadrant of right female breast: Secondary | ICD-10-CM | POA: Diagnosis not present

## 2023-02-11 DIAGNOSIS — A4902 Methicillin resistant Staphylococcus aureus infection, unspecified site: Secondary | ICD-10-CM | POA: Diagnosis not present

## 2023-02-11 DIAGNOSIS — M6281 Muscle weakness (generalized): Secondary | ICD-10-CM | POA: Diagnosis not present

## 2023-02-11 DIAGNOSIS — Z853 Personal history of malignant neoplasm of breast: Secondary | ICD-10-CM | POA: Diagnosis not present

## 2023-02-14 NOTE — Progress Notes (Signed)
[address], [place] ([id number]) [id number] Page 1 of 1 Visit Report for 02/09/2023 SuperBill Details Patient Name: Date of Service: Karina Foster, Karina Foster 02/09/2023 Medical Record Number: [id number] Patient Account Number: [id number] Date of Birth/Sex: Treating RN: 14-Sep-1940 (82 y.o. F) Primary Care Provider: Hoyle Sauer Other Clinician: Referring Provider: Treating Provider/Extender: Laurette Schimke, Ravisankar R Weeks in Treatment: 6 Diagnosis Coding ICD-10 Codes Code Description S21.001A Unspecified open wound of right breast, initial encounter L98.492 Non-pressure chronic ulcer of skin of other sites with fat layer exposed C50.411 Malignant neoplasm of upper-outer quadrant of right female breast E11.622 Type 2 diabetes mellitus with other skin ulcer Facility Procedures CPT4 Code Description Modifier Quantity [id number] - WOUND VAC-50 SQ CM OR LESS 1 Electronic Signature(s) Signed: 02/09/2023 6:23:56 PM By: Allen Derry PA-C Signed: 02/14/2023 3:42:51 PM By: Thayer Dallas Entered By: Thayer Dallas on 02/09/2023 14:54:36

## 2023-02-15 ENCOUNTER — Encounter (HOSPITAL_BASED_OUTPATIENT_CLINIC_OR_DEPARTMENT_OTHER): Payer: Medicare Other | Admitting: Internal Medicine

## 2023-02-15 DIAGNOSIS — L98492 Non-pressure chronic ulcer of skin of other sites with fat layer exposed: Secondary | ICD-10-CM

## 2023-02-15 DIAGNOSIS — S21001A Unspecified open wound of right breast, initial encounter: Secondary | ICD-10-CM | POA: Diagnosis not present

## 2023-02-15 DIAGNOSIS — E11622 Type 2 diabetes mellitus with other skin ulcer: Secondary | ICD-10-CM | POA: Diagnosis not present

## 2023-02-15 DIAGNOSIS — E1140 Type 2 diabetes mellitus with diabetic neuropathy, unspecified: Secondary | ICD-10-CM | POA: Diagnosis not present

## 2023-02-15 DIAGNOSIS — I2510 Atherosclerotic heart disease of native coronary artery without angina pectoris: Secondary | ICD-10-CM | POA: Diagnosis not present

## 2023-02-15 DIAGNOSIS — C50411 Malignant neoplasm of upper-outer quadrant of right female breast: Secondary | ICD-10-CM | POA: Diagnosis not present

## 2023-02-15 NOTE — Progress Notes (Signed)
Karina Foster, Karina Foster ([id number]) [id number] Page 1 of 7 Visit Report for 01/25/2023 Chief Complaint Document Details Patient Name: Date of Service: Karina Foster, Karina Foster 01/25/2023 11:00 A M Medical Record Number: [id number] Patient Account Number: [id number] Date of Birth/Sex: Treating RN: 08/16/1941 (82 y.o. F) Primary Care Provider: Hoyle Sauer Other Clinician: Referring Provider: Treating Provider/Extender: Jodie Echevaria, Ravisankar R Weeks in Treatment: 4 Information Obtained from: Patient Chief Complaint 12/24/2022; Right-sided wound status postmastectomy Electronic Signature(s) Signed: 01/25/2023 12:02:08 PM By: Geralyn Corwin DO Entered By: Geralyn Corwin on 01/25/2023 11:56:17 -------------------------------------------------------------------------------- Debridement Details Patient Name: Date of Service: Karina Dus. 01/25/2023 11:00 A M Medical Record Number: [id number] Patient Account Number: [id number] Date of Birth/Sex: Treating RN: 1940/12/10 (82 y.o. Gevena Mart Primary Care Provider: Hoyle Sauer Other Clinician: Referring Provider: Treating Provider/Extender: Vevelyn Royals Weeks in Treatment: 4 Debridement Performed for Assessment: Wound #1 Right Axilla Performed By: Physician Geralyn Corwin, DO Debridement Type: Debridement Level of Consciousness (Pre-procedure): Awake and Alert Pre-procedure Verification/Time Out Yes - 11:50 Taken: Start Time: 11:51 Pain Control: Lidocaine 4% T opical Solution Percent of Wound Bed Debrided: 100% T Area Debrided (cm): otal 3.92 Tissue and other material debrided: Viable, Non-Viable, Slough, Subcutaneous, Slough Level: Skin/Subcutaneous Tissue Debridement Description: Excisional Instrument: Curette Bleeding: Minimum Hemostasis Achieved: Pressure End Time: 11:53 Procedural Pain: 0 Post Procedural Pain: 0 Response to Treatment: Procedure was  tolerated well Level of Consciousness (Post- Awake and Alert procedure): Post Debridement Measurements of Total Wound Length: (cm) 2.5 Width: (cm) 2 Depth: (cm) 1.5 Volume: (cm) 5.89 Character of Wound/Ulcer Post Debridement: Improved Post Procedure Diagnosis Same as Pre-procedure Notes Scribed for Dr Mikey Bussing by Brenton Grills RN. Electronic Signature(s) Signed: 01/25/2023 12:02:08 PM By: Debara Pickett ([id number]) [id number] Page 2 of 7 Signed: 02/15/2023 7:50:28 AM By: Brenton Grills Entered By: Brenton Grills on 01/25/2023 11:53:32 -------------------------------------------------------------------------------- HPI Details Patient Name: Date of Service: Karina Foster, Karina Foster 01/25/2023 11:00 A M Medical Record Number: [id number] Patient Account Number: [id number] Date of Birth/Sex: Treating RN: 1940/09/20 (82 y.o. F) Primary Care Provider: Hoyle Sauer Other Clinician: Referring Provider: Treating Provider/Extender: Vevelyn Royals Weeks in Treatment: 4 History of Present Illness HPI Description: 12/24/2022 [name] is a 82 year old female with a past medical history of controlled type 2 diabetes, right sided breast cancer status postmastectomy pursuing chemotherapy that presents the clinic for a 40-month history of nonhealing surgical wound dehiscence to the right side status postmastectomy. On 10/14/2022 patient had a right mastectomy by Dr. Donell Beers and subsequently developed cellulitis and wound dehiscence. She has been using saline wet-to-dry dressings. She currently denies signs of infection. She did not tolerate her first round of chemotherapy and this is being placed on hold for now. She did not have radiation or implants. She does state that 27 years ago she had a lumpectomy to the site with radiation. 5/3; patient presents for follow-up. She has been approved for the wound VAC and has this present  with her today. She lives at [facility] and there are nurses there that can change the wound VAC twice a week. She has been using Vashe wet-to-dry dressings. Wound is stable. 5/14; patient presents for follow-up. She has been using the wound VAC. She has some irritation to where the drape is placed. We will start Replicare to help with this issue. 5/21; patient presents for follow-up. She has been using the wound VAC. The irritation to the periwound has  improved nicely with Replicare/DuoDERM. Wound is smaller. She has no issues or complaints today. Electronic Signature(s) Signed: 01/25/2023 12:02:08 PM By: Geralyn Corwin DO Entered By: Geralyn Corwin on 01/25/2023 11:57:29 -------------------------------------------------------------------------------- Physical Exam Details Patient Name: Date of Service: Karina Foster, Karina Foster 01/25/2023 11:00 A M Medical Record Number: [id number] Patient Account Number: [id number] Date of Birth/Sex: Treating RN: 03-May-1941 (82 y.o. F) Primary Care Provider: Hoyle Sauer Other Clinician: Referring Provider: Treating Provider/Extender: Jodie Echevaria, Ravisankar R Weeks in Treatment: 4 Constitutional respirations regular, non-labored and within target range for patient.. Cardiovascular 2+ dorsalis pedis/posterior tibialis pulses. Psychiatric pleasant and cooperative. Notes T the right side postmastectomy surgical site there is wound dehiscence to the lateral aspect with granulation tissue and non viable tissue at the opening and o undermining circumferentially. No signs of surrounding infection including increased warmth, erythema or purulent drainage. Electronic Signature(s) Signed: 01/25/2023 12:02:08 PM By: Geralyn Corwin DO Entered By: Geralyn Corwin on 01/25/2023 11:58:03 -------------------------------------------------------------------------------- Physician Orders Details Patient Name: Date of Service: Karina Foster, Karina Foster  01/25/2023 11:00 A M Medical Record Number: [id number] Patient Account Number: [id number] Date of Birth/Sex: Treating RN: Oct 13, 1940 (82 y.o. Madyson, Lukach, Lodi ([id number]) [phone].pdf Page 3 of 7 Primary Care Provider: Hoyle Sauer Other Clinician: Referring Provider: Treating Provider/Extender: Jodie Echevaria, Ravisankar R Weeks in Treatment: 4 Verbal / Phone Orders: No Diagnosis Coding Follow-up Appointments ppointment in 1 week. - w/ Dr. Mikey Bussing and Lupita Leash Tuesday Rm # 9 Return A Other: - Once we apply the wound vac, The nurses at [facility] will begin changing the wound vac Friday's, and Tuesday's you will come to the wound care center and we will change it here. Wound vac will only be change twice a week, Tuesday's and Friday's. ***Just for the week of 01/10/23, [facility] will change the wound vac Monday 01/10/23 and Friday 01/14/23*** Bathing/ Shower/ Hygiene May shower with protection but do not get wound dressing(s) wet. Protect dressing(s) with water repellant cover (for example, large plastic bag) or a cast cover and may then take shower. Negative Presssure Wound Therapy Wound Vac to wound continuously at 134mm/hg pressure - Medela wound vac with white and black foam combination Black and White Foam combination Wound Treatment Wound #1 - Axilla Wound Laterality: Right Cleanser: Vashe 5.8 (oz) 1 x Per Day/15 Days Discharge Instructions: Cleanse the wound with Vashe prior to applying a clean dressing using gauze sponges, not tissue or cotton balls. Prim Dressing: DuoDERM CGF Border Dressing Square, 6x6 (in/in) (Generic) 1 x Per Day/15 Days ary Electronic Signature(s) Signed: 01/25/2023 12:02:08 PM By: Geralyn Corwin DO Entered By: Geralyn Corwin on 01/25/2023 11:59:07 -------------------------------------------------------------------------------- Problem List Details Patient Name: Date of Service: Karina Dus. 01/25/2023 11:00 A M Medical Record Number: [id number] Patient Account Number: [id number] Date of Birth/Sex: Treating RN: 1941/02/25 (82 y.o. Gevena Mart Primary Care Provider: Hoyle Sauer Other Clinician: Referring Provider: Treating Provider/Extender: Vevelyn Royals Weeks in Treatment: 4 Active Problems ICD-10 Encounter Code Description Active Date MDM Diagnosis S21.001A Unspecified open wound of right breast, initial encounter 12/24/2022 No Yes C50.411 Malignant neoplasm of upper-outer quadrant of right female breast 12/24/2022 No Yes E11.622 Type 2 diabetes mellitus with other skin ulcer 12/24/2022 No Yes Inactive Problems Resolved Problems Electronic Signature(s) Signed: 01/25/2023 12:02:08 PM By: Geralyn Corwin DO Entered By: Geralyn Corwin on 01/25/2023 11:54:05 Blickenstaff, [name] ([id number]) [id number] Page 4 of 7 -------------------------------------------------------------------------------- Progress Note Details Patient Name: Date of Service: Karina Foster, Karina York  N G. 01/25/2023 11:00 A M Medical Record Number: [id number] Patient Account Number: [id number] Date of Birth/Sex: Treating RN: 1940-12-16 (82 y.o. F) Primary Care Provider: Hoyle Sauer Other Clinician: Referring Provider: Treating Provider/Extender: Vevelyn Royals Weeks in Treatment: 4 Subjective Chief Complaint Information obtained from Patient 12/24/2022; Right-sided wound status postmastectomy History of Present Illness (HPI) 12/24/2022 [name] is a 82 year old female with a past medical history of controlled type 2 diabetes, right sided breast cancer status postmastectomy pursuing chemotherapy that presents the clinic for a 71-month history of nonhealing surgical wound dehiscence to the right side status postmastectomy. On 10/14/2022 patient had a right mastectomy by Dr. Donell Beers and subsequently developed cellulitis and  wound dehiscence. She has been using saline wet-to-dry dressings. She currently denies signs of infection. She did not tolerate her first round of chemotherapy and this is being placed on hold for now. She did not have radiation or implants. She does state that 27 years ago she had a lumpectomy to the site with radiation. 5/3; patient presents for follow-up. She has been approved for the wound VAC and has this present with her today. She lives at [facility] and there are nurses there that can change the wound VAC twice a week. She has been using Vashe wet-to-dry dressings. Wound is stable. 5/14; patient presents for follow-up. She has been using the wound VAC. She has some irritation to where the drape is placed. We will start Replicare to help with this issue. 5/21; patient presents for follow-up. She has been using the wound VAC. The irritation to the periwound has improved nicely with Replicare/DuoDERM. Wound is smaller. She has no issues or complaints today. Patient History Information obtained from Patient, Chart. Family History Unknown History. Social History Former smoker, Marital Status - Married, Alcohol Use - Never, Drug Use - No History, Caffeine Use - Rarely. Medical History Respiratory Patient has history of Sleep Apnea Cardiovascular Patient has history of Coronary Artery Disease, Myocardial Infarction Endocrine Patient has history of Type II Diabetes Neurologic Patient has history of Neuropathy - HANDS and feet Oncologic Patient has history of Received Chemotherapy - right breast, Received Radiation - right breat ca Hospitalization/Surgery History - right total mastectomy. - portacath placement. - breast biopsy right. - cataract extraction. - coronary angioplasty with stent placement. Objective Constitutional respirations regular, non-labored and within target range for patient.. Vitals Time Taken: 11:32 AM, Temperature: 98.1 F, Pulse: 76 bpm, Respiratory Rate: 18  breaths/min, Blood Pressure: 125/74 mmHg, Capillary Blood Glucose: 147 mg/dl. Cardiovascular 2+ dorsalis pedis/posterior tibialis pulses. Psychiatric pleasant and cooperative. Karina Foster, Karina Foster ([id number]) [id number] Page 5 of 7 General Notes: T the right side postmastectomy surgical site there is wound dehiscence to the lateral aspect with granulation tissue and non viable tissue at o the opening and undermining circumferentially. No signs of surrounding infection including increased warmth, erythema or purulent drainage. Integumentary (Hair, Skin) Wound #1 status is Open. Original cause of wound was Surgical Injury. The date acquired was: 10/14/2022. The wound has been in treatment 4 weeks. The wound is located on the Right Axilla. The wound measures 2.5cm length x 2cm width x 1.5cm depth; 3.927cm^2 area and 5.89cm^3 volume. There is Fat Layer (Subcutaneous Tissue) exposed. There is no tunneling noted, however, there is undermining starting at 12:00 and ending at 2:00 with a maximum distance of 1.5cm. There is a medium amount of serosanguineous drainage noted. The wound margin is distinct with the outline attached to the wound base. There is large (  67-100%) red, pink granulation within the wound bed. There is a small (1-33%) amount of necrotic tissue within the wound bed including Adherent Slough. The periwound skin appearance did not exhibit: Callus, Crepitus, Excoriation, Induration, Rash, Scarring, Dry/Scaly, Maceration, Atrophie Blanche, Cyanosis, Ecchymosis, Hemosiderin Staining, Mottled, Pallor, Rubor, Erythema. Periwound temperature was noted as No Abnormality. The periwound has tenderness on palpation. Assessment Active Problems ICD-10 Unspecified open wound of right breast, initial encounter Malignant neoplasm of upper-outer quadrant of right female breast Type 2 diabetes mellitus with other skin ulcer Patient's wound has shown improvement in size in  appearance since last clinic visit. I debrided nonviable tissue. I recommended continuing the course with the wound VAC. Follow-up in 1 week. Procedures Wound #1 Pre-procedure diagnosis of Wound #1 is an Open Surgical Wound located on the Right Axilla . There was a Excisional Skin/Subcutaneous Tissue Debridement with a total area of 3.92 sq cm performed by Geralyn Corwin, DO. With the following instrument(s): Curette to remove Viable and Non-Viable tissue/material. Material removed includes Subcutaneous Tissue and Slough and after achieving pain control using Lidocaine 4% T opical Solution. No specimens were taken. A time out was conducted at 11:50, prior to the start of the procedure. A Minimum amount of bleeding was controlled with Pressure. The procedure was tolerated well with a pain level of 0 throughout and a pain level of 0 following the procedure. Post Debridement Measurements: 2.5cm length x 2cm width x 1.5cm depth; 5.89cm^3 volume. Character of Wound/Ulcer Post Debridement is improved. Post procedure Diagnosis Wound #1: Same as Pre-Procedure General Notes: Scribed for Dr Mikey Bussing by Brenton Grills RN.Marland Kitchen Plan Follow-up Appointments: Return Appointment in 1 week. - w/ Dr. Mikey Bussing and Lupita Leash Tuesday Rm # 9 Other: - Once we apply the wound vac, The nurses at [facility] will begin changing the wound vac Friday's, and Tuesday's you will come to the wound care center and we will change it here. Wound vac will only be change twice a week, Tuesday's and Friday's. ***Just for the week of 01/10/23, [facility] will change the wound vac Monday 01/10/23 and Friday 01/14/23*** Bathing/ Shower/ Hygiene: May shower with protection but do not get wound dressing(s) wet. Protect dressing(s) with water repellant cover (for example, large plastic bag) or a cast cover and may then take shower. Negative Presssure Wound Therapy: Wound Vac to wound continuously at 181mm/hg pressure - Medela wound vac with  white and black foam combination Black and White Foam combination WOUND #1: - Axilla Wound Laterality: Right Cleanser: Vashe 5.8 (oz) 1 x Per Day/15 Days Discharge Instructions: Cleanse the wound with Vashe prior to applying a clean dressing using gauze sponges, not tissue or cotton balls. Prim Dressing: DuoDERM CGF Border Dressing Square, 6x6 (in/in) (Generic) 1 x Per Day/15 Days ary 1. Continue wound VAC 2. Follow-up in 1 week 3. In office sharp debridement Electronic Signature(s) Signed: 01/25/2023 12:02:08 PM By: Geralyn Corwin DO Entered By: Geralyn Corwin on 01/25/2023 12:00:47 HxROS Details -------------------------------------------------------------------------------- Karina Foster ([id number]) [id number] Page 6 of 7 Patient Name: Date of Service: Karina Foster, Karina Foster 01/25/2023 11:00 A M Medical Record Number: [id number] Patient Account Number: [id number] Date of Birth/Sex: Treating RN: [birth year] (82 y.o. F) Primary Care Provider: Hoyle Sauer Other Clinician: Referring Provider: Treating Provider/Extender: Jodie Echevaria, Ravisankar R Weeks in Treatment: 4 Information Obtained From Patient Chart Respiratory Medical History: Positive for: Sleep Apnea Cardiovascular Medical History: Positive for: Coronary Artery Disease; Myocardial Infarction Endocrine Medical History: Positive for: Type II Diabetes Treated with: Insulin  Neurologic Medical History: Positive for: Neuropathy - HANDS and feet Oncologic Medical History: Positive for: Received Chemotherapy - right breast; Received Radiation - right breat ca Immunizations Pneumococcal Vaccine: Received Pneumococcal Vaccination: Yes Received Pneumococcal Vaccination On or After 60th Birthday: Yes Implantable Devices Yes Hospitalization / Surgery History Type of Hospitalization/Surgery right total mastectomy portacath placement breast biopsy right cataract  extraction coronary angioplasty with stent placement Family and Social History Unknown History: Yes; Former smoker; Marital Status - Married; Alcohol Use: Never; Drug Use: No History; Caffeine Use: Rarely; Financial Concerns: No; Food, Clothing or Shelter Needs: No; Support System Lacking: No; Transportation Concerns: No Electronic Signature(s) Signed: 01/25/2023 12:02:08 PM By: Geralyn Corwin DO Entered By: Geralyn Corwin on 01/25/2023 11:57:35 -------------------------------------------------------------------------------- SuperBill Details Patient Name: Date of Service: Karina Dus 01/25/2023 Medical Record Number: [id number] Patient Account Number: [id number] Date of Birth/Sex: Treating RN: 12/17/1940 (82 y.o. F) Primary Care Provider: Hoyle Sauer Other Clinician: Referring Provider: Treating Provider/Extender: Jodie Echevaria, Serafina Royals Weeks in Treatment: [address], [place] ([id number]) [id number] Page 7 of 7 Diagnosis Coding ICD-10 Codes Code Description S21.001A Unspecified open wound of right breast, initial encounter C50.411 Malignant neoplasm of upper-outer quadrant of right female breast E11.622 Type 2 diabetes mellitus with other skin ulcer Facility Procedures : CPT4 Code: [id number] Description: 11042 - DEB SUBQ TISSUE 20 SQ CM/< ICD-10 Diagnosis Description S21.001A Unspecified open wound of right breast, initial encounter E11.622 Type 2 diabetes mellitus with other skin ulcer Modifier: Quantity: 1 : CPT4 Code: [id number] Description: 97607 NEG PRESS WND TX <=50 SQ CM ICD-10 Diagnosis Description S21.001A Unspecified open wound of right breast, initial encounter C50.411 Malignant neoplasm of upper-outer quadrant of right female breast Modifier: Quantity: 1 Physician Procedures : CPT4 Code Description Modifier [id number] 11042 - WC PHYS SUBQ TISS 20 SQ CM ICD-10 Diagnosis Description S21.001A Unspecified open wound of  right breast, initial encounter E11.622 Type 2 diabetes mellitus with other skin ulcer Quantity: 1 Electronic Signature(s) Signed: 01/25/2023 4:32:32 PM By: Geralyn Corwin DO Signed: 02/15/2023 7:50:28 AM By: Brenton Grills Previous Signature: 01/25/2023 12:02:08 PM Version By: Geralyn Corwin DO Entered By: Brenton Grills on 01/25/2023 12:20:43

## 2023-02-15 NOTE — Progress Notes (Signed)
[address], [place] ([id number]) [id number] Page 1 of 6 Visit Report for 01/25/2023 Arrival Information Details Patient Name: Date of Service: Karina Foster, Karina Foster 01/25/2023 11:00 A M Medical Record Number: [id number] Patient Account Number: [id number] Date of Birth/Sex: Treating RN: December 13, 1940 (82 y.o. Gevena Mart Primary Care Cashlyn Huguley: Hoyle Sauer Other Clinician: Referring Marlei Glomski: Treating Ytzel Gubler/Extender: Vevelyn Royals Weeks in Treatment: 4 Visit Information History Since Last Visit All ordered tests and consults were completed: Yes Patient Arrived: Ambulatory Added or deleted any medications: No Arrival Time: 11:31 Any new allergies or adverse reactions: No Accompanied By: self Had a fall or experienced change in No Transfer Assistance: None activities of daily living that may affect Patient Identification Verified: Yes risk of falls: Secondary Verification Process Completed: Yes Signs or symptoms of abuse/neglect since last visito No Patient Requires Transmission-Based Precautions: No Hospitalized since last visit: No Patient Has Alerts: No Implantable device outside of the clinic excluding No cellular tissue based products placed in the center since last visit: Has Dressing in Place as Prescribed: Yes Pain Present Now: No Electronic Signature(s) Signed: 02/15/2023 7:50:28 AM By: Brenton Grills Entered By: Brenton Grills on 01/25/2023 11:32:14 -------------------------------------------------------------------------------- Encounter Discharge Information Details Patient Name: Date of Service: Karina Foster. 01/25/2023 11:00 A M Medical Record Number: [id number] Patient Account Number: [id number] Date of Birth/Sex: Treating RN: 1941-05-23 (82 y.o. Gevena Mart Primary Care Farha Dano: Hoyle Sauer Other Clinician: Referring Shriley Joffe: Treating Kirsti Mcalpine/Extender: Vevelyn Royals Weeks in Treatment: 4 Encounter Discharge Information Items Post Procedure Vitals Discharge Condition: Stable Temperature (F): 98.1 Ambulatory Status: Ambulatory Pulse (bpm): 78 Discharge Destination: Home Respiratory Rate (breaths/min): 18 Transportation: Private Auto Blood Pressure (mmHg): 144/67 Accompanied By: self Schedule Follow-up Appointment: Yes Clinical Summary of Care: Patient Declined Electronic Signature(s) Signed: 02/15/2023 7:50:28 AM By: Brenton Grills Entered By: Brenton Grills on 01/25/2023 12:18:29 -------------------------------------------------------------------------------- Lower Extremity Assessment Details Patient Name: Date of Service: Karina Foster 01/25/2023 11:00 A M Medical Record Number: [id number] Patient Account Number: [id number] Date of Birth/Sex: Treating RN: 16-Jul-1941 (82 y.o. Gevena Mart Primary Care Jamorion Gomillion: Hoyle Sauer Other Clinician: Referring Chrishaun Sasso: Treating Nylan Nakatani/Extender: Jodie Echevaria, Ravisankar R Weeks in Treatment: 4 Electronic Signature(s) Signed: 02/15/2023 7:50:28 AM By: Frankey Shown, Signed: 02/15/2023 7:50:28 AM By: Danelle Earthly ([id number]) [id number] Page 2 of 6 Entered By: Brenton Grills on 01/25/2023 11:35:00 -------------------------------------------------------------------------------- Multi Wound Chart Details Patient Name: Date of Service: Karina Foster 01/25/2023 11:00 A M Medical Record Number: [id number] Patient Account Number: [id number] Date of Birth/Sex: Treating RN: [birth year] (82 y.o. F) Primary Care Shemeika Starzyk: Hoyle Sauer Other Clinician: Referring Eain Mullendore: Treating Davit Vassar/Extender: Jodie Echevaria, Ravisankar R Weeks in Treatment: 4 Vital Signs Height(in): Capillary Blood Glucose(mg/dl): 742 Weight(lbs): Pulse(bpm): 76 Body Mass Index(BMI): Blood Pressure(mmHg): 125/74 Temperature(F): 98.1 Respiratory  Rate(breaths/min): 18 [1:Photos:] [N/A:N/A] Right Axilla N/A N/A Wound Location: Surgical Injury N/A N/A Wounding Event: Open Surgical Wound N/A N/A Primary Etiology: Sleep Apnea, Coronary Artery N/A N/A Comorbid History: Disease, Myocardial Infarction, Type II Diabetes, Neuropathy, Received Chemotherapy, Received Radiation 10/14/2022 N/A N/A Date Acquired: 4 N/A N/A Weeks of Treatment: Open N/A N/A Wound Status: No N/A N/A Wound Recurrence: 2.5x2x1.5 N/A N/A Measurements L x W x D (cm) 3.927 N/A N/A A (cm) : rea 5.89 N/A N/A Volume (cm) : 52.70% N/A N/A % Reduction in A rea: 52.70% N/A N/A % Reduction in Volume: 12 Starting Position 1 (o'clock): 2 Ending Position  1 (o'clock): 1.5 Maximum Distance 1 (cm): Yes N/A N/A Undermining: Full Thickness With Exposed Support N/A N/A Classification: Structures Medium N/A N/A Exudate A mount: Serosanguineous N/A N/A Exudate Type: red, brown N/A N/A Exudate Color: Distinct, outline attached N/A N/A Wound Margin: Large (67-100%) N/A N/A Granulation A mount: Red, Pink N/A N/A Granulation Quality: Small (1-33%) N/A N/A Necrotic A mount: Fat Layer (Subcutaneous Tissue): Yes N/A N/A Exposed Structures: Fascia: No Tendon: No Muscle: No Joint: No Bone: No Small (1-33%) N/A N/A Epithelialization: Debridement - Excisional N/A N/A Debridement: Pre-procedure Verification/Time Out 11:50 N/A N/A Taken: Lidocaine 4% Topical Solution N/A N/A Pain Control: Subcutaneous, Slough N/A N/A Tissue Debrided: Skin/Subcutaneous Tissue N/A N/A Level: 3.92 N/A N/A Debridement A (sq cm): rea Curette N/A N/A Instrument: Minimum N/A N/A Bleeding: Pressure N/A N/A Hemostasis A chieved: 0 N/A N/A Procedural Pain: 0 N/A N/A Post Procedural PainJASSMIN, [name] ([id number]) [id number] Page 3 of 6 Procedure was tolerated well N/A N/A Debridement Treatment Response: 2.5x2x1.5 N/A N/A Post  Debridement Measurements L x W x D (cm) 5.89 N/A N/A Post Debridement Volume: (cm) Excoriation: No N/A N/A Periwound Skin Texture: Induration: No Callus: No Crepitus: No Rash: No Scarring: No Maceration: No N/A N/A Periwound Skin Moisture: Dry/Scaly: No Atrophie Blanche: No N/A N/A Periwound Skin Color: Cyanosis: No Ecchymosis: No Erythema: No Hemosiderin Staining: No Mottled: No Pallor: No Rubor: No No Abnormality N/A N/A Temperature: Yes N/A N/A Tenderness on Palpation: Debridement N/A N/A Procedures Performed: Treatment Notes Electronic Signature(s) Signed: 01/25/2023 12:02:08 PM By: Geralyn Corwin DO Entered By: Geralyn Corwin on 01/25/2023 11:56:05 -------------------------------------------------------------------------------- Multi-Disciplinary Care Plan Details Patient Name: Date of Service: Karina Foster. 01/25/2023 11:00 A M Medical Record Number: [id number] Patient Account Number: [id number] Date of Birth/Sex: Treating RN: 1940-09-27 (82 y.o. Gevena Mart Primary Care Yuvonne Lanahan: Hoyle Sauer Other Clinician: Referring Tyce Delcid: Treating Khari Lett/Extender: Jodie Echevaria, Ravisankar R Weeks in Treatment: 4 Active Inactive Electronic Signature(s) Signed: 02/15/2023 7:50:28 AM By: Brenton Grills Entered By: Brenton Grills on 01/25/2023 11:44:31 -------------------------------------------------------------------------------- Negative Pressure Wound Therapy Maintenance (NPWT) Details Patient Name: Date of Service: AMIRYAH, BLONG 01/25/2023 11:00 A M Medical Record Number: [id number] Patient Account Number: [id number] Date of Birth/Sex: Treating RN: 10/31/1940 (82 y.o. Gevena Mart Primary Care Nakari Bracknell: Hoyle Sauer Other Clinician: Referring Robina Hamor: Treating Shalona Harbour/Extender: Jodie Echevaria, Ravisankar R Weeks in Treatment: 4 NPWT Maintenance Performed for: Wound #1 Right Axilla Performed By: Brenton Grills,  RN Type: VAC System Coverage Size (sq cm): 5 Pressure Type: Constant Pressure Setting: 125 mmHG Drain Type: None Primary Contact: Other : Duoderm Sponge/Dressing Type: Foam- Black,Foam- White Date Initiated: 01/18/2023 Quantity of Sponges/Gauze Removed: 2 Canister Exudate Volume: 5 Quantity of Sponges/Gauze Inserted: 2 Days On NPWT : [address] ([id number]) [id number] Page 4 of 6 Post Procedure Diagnosis Same as Pre-procedure Notes Scribed for Dr Mikey Bussing by Brenton Grills RN. Electronic Signature(s) Signed: 02/15/2023 7:50:28 AM By: Brenton Grills Entered By: Brenton Grills on 01/25/2023 12:19:43 -------------------------------------------------------------------------------- Pain Assessment Details Patient Name: Date of Service: KAITRIN, DECOUX 01/25/2023 11:00 A M Medical Record Number: [id number] Patient Account Number: [id number] Date of Birth/Sex: Treating RN: July 22, 1941 (82 y.o. Gevena Mart Primary Care Trinisha Paget: Hoyle Sauer Other Clinician: Referring Erma Raiche: Treating Giuseppina Quinones/Extender: Vevelyn Royals Weeks in Treatment: 4 Active Problems Location of Pain Severity and Description of Pain Patient Has Paino No Site Locations Pain Management and Medication Current Pain Management: Electronic Signature(s) Signed: 02/15/2023  7:50:28 AM By: Isabelle Course By: Brenton Grills on 01/25/2023 11:34:39 -------------------------------------------------------------------------------- Patient/Caregiver Education Details Patient Name: Date of Service: Schloesser, SUSA N G. 5/21/2024andnbsp11:00 A M Medical Record Number: [id number] Patient Account Number: [id number] Date of Birth/Gender: Treating RN: September 04, 1941 (82 y.o. Gevena Mart Primary Care Physician: Hoyle Sauer Other Clinician: Referring Physician: Treating Physician/Extender: Otho Najjar in Treatment:  4 Education Assessment Education Provided To: Patient Education Topics Provided IYSHA, MISHKIN ([id number]) [id number] Page 5 of 6 Wound/Skin Impairment: Methods: Explain/Verbal Responses: State content correctly Electronic Signature(s) Signed: 02/15/2023 7:50:28 AM By: Brenton Grills Entered By: Brenton Grills on 01/25/2023 11:44:51 -------------------------------------------------------------------------------- Wound Assessment Details Patient Name: Date of Service: RONNETT, PULLIN 01/25/2023 11:00 A M Medical Record Number: [id number] Patient Account Number: [id number] Date of Birth/Sex: Treating RN: [birth year] (82 y.o. Gevena Mart Primary Care Derick Seminara: Hoyle Sauer Other Clinician: Referring Monserratt Knezevic: Treating Quinn Bartling/Extender: Jodie Echevaria, Ravisankar R Weeks in Treatment: 4 Wound Status Wound Number: 1 Primary Open Surgical Wound Etiology: Wound Location: Right Axilla Wound Open Wounding Event: Surgical Injury Status: Date Acquired: 10/14/2022 Comorbid Sleep Apnea, Coronary Artery Disease, Myocardial Infarction, Type Weeks Of Treatment: 4 History: II Diabetes, Neuropathy, Received Chemotherapy, Received Clustered Wound: No Radiation Photos Wound Measurements Length: (cm) 2.5 Width: (cm) 2 Depth: (cm) 1.5 Area: (cm) 3.927 Volume: (cm) 5.89 % Reduction in Area: 52.7% % Reduction in Volume: 52.7% Epithelialization: Small (1-33%) Tunneling: No Undermining: Yes Starting Position (o'clock): 12 Ending Position (o'clock): 2 Maximum Distance: (cm) 1.5 Wound Description Classification: Full Thickness With Exposed Support Structures Wound Margin: Distinct, outline attached Exudate Amount: Medium Exudate Type: Serosanguineous Exudate Color: red, brown Foul Odor After Cleansing: No Slough/Fibrino Yes Wound Bed Granulation Amount: Large (67-100%) Exposed Structure Granulation Quality: Red, Pink Fascia Exposed:  No Necrotic Amount: Small (1-33%) Fat Layer (Subcutaneous Tissue) Exposed: Yes Necrotic Quality: Adherent Slough Tendon Exposed: No Muscle Exposed: No Joint Exposed: No Bone Exposed: No [address] CECIL, BIXBY ([id number]) [id number] Page 6 of 6 No Abnormalities Noted: No No Abnormalities Noted: No Callus: No Atrophie Blanche: No Crepitus: No Cyanosis: No Excoriation: No Ecchymosis: No Induration: No Erythema: No Rash: No Hemosiderin Staining: No Scarring: No Mottled: No Pallor: No Moisture Rubor: No No Abnormalities Noted: No Dry / Scaly: No Temperature / Pain Maceration: No Temperature: No Abnormality Tenderness on Palpation: Yes Electronic Signature(s) Signed: 02/15/2023 7:50:28 AM By: Brenton Grills Entered By: Brenton Grills on 01/25/2023 11:55:45 -------------------------------------------------------------------------------- Vitals Details Patient Name: Date of Service: Karina Foster. 01/25/2023 11:00 A M Medical Record Number: [id number] Patient Account Number: [id number] Date of Birth/Sex: Treating RN: Jan 31, 1941 (82 y.o. Gevena Mart Primary Care Kelena Garrow: Hoyle Sauer Other Clinician: Referring Zebadiah Willert: Treating Kenzlie Disch/Extender: Jodie Echevaria, Ravisankar R Weeks in Treatment: 4 Vital Signs Time Taken: 11:32 Temperature (F): 98.1 Pulse (bpm): 76 Respiratory Rate (breaths/min): 18 Blood Pressure (mmHg): 125/74 Capillary Blood Glucose (mg/dl): 742 Reference Range: 80 - 120 mg / dl Electronic Signature(s) Signed: 02/15/2023 7:50:28 AM By: Brenton Grills Entered By: Brenton Grills on 01/25/2023 11:34:32

## 2023-02-15 NOTE — Progress Notes (Signed)
[address] [place] ([id number]) [id number] Page 1 of 6 Visit Report for 01/18/2023 Chief Complaint Document Details Patient Name: Date of Service: Karina Foster, Karina Foster 01/18/2023 11:00 A M Medical Record Number: [id number] Patient Account Number: [id number] Date of Birth/Sex: Treating RN: [birth year] (82 y.o. F) Primary Care Provider: Hoyle Sauer Other Clinician: Referring Provider: Treating Provider/Extender: Jodie Echevaria, Ravisankar R Weeks in Treatment: 3 Information Obtained from: Patient Chief Complaint 12/24/2022; Right-sided wound status postmastectomy Electronic Signature(s) Signed: 01/18/2023 2:24:30 PM By: Geralyn Corwin DO Entered By: Geralyn Corwin on 01/18/2023 11:44:25 -------------------------------------------------------------------------------- HPI Details Patient Name: Date of Service: Karina Foster. 01/18/2023 11:00 A M Medical Record Number: [id number] Patient Account Number: [id number] Date of Birth/Sex: Treating RN: [birth year] (82 y.o. F) Primary Care Provider: Hoyle Sauer Other Clinician: Referring Provider: Treating Provider/Extender: Vevelyn Royals Weeks in Treatment: 3 History of Present Illness HPI Description: 12/24/2022 Ms. Karina Foster is a 82 year old female with a past medical history of controlled type 2 diabetes, right sided breast cancer status postmastectomy pursuing chemotherapy that presents the clinic for a 41-month history of nonhealing surgical wound dehiscence to the right side status postmastectomy. On 10/14/2022 patient had a right mastectomy by Dr. Donell Beers and subsequently developed cellulitis and wound dehiscence. She has been using saline wet-to-dry dressings. She currently denies signs of infection. She did not tolerate her first round of chemotherapy and this is being placed on hold for now. She did not have radiation or implants. She does state that 27 years ago she had  a lumpectomy to the site with radiation. 5/3; patient presents for follow-up. She has been approved for the wound VAC and has this present with her today. [facility] lives at [facility] and there are nurses there that can change the wound VAC twice a week. She has been using Vashe wet-to-dry dressings. Wound is stable. 5/14; patient presents for follow-up. She has been using the wound VAC. She has some irritation to where the drape is placed. We will start Replicare to help with this issue. Electronic Signature(s) Signed: 01/18/2023 2:24:30 PM By: Geralyn Corwin DO Entered By: Geralyn Corwin on 01/18/2023 11:45:01 -------------------------------------------------------------------------------- Physical Exam Details Patient Name: Date of Service: Karina Foster 01/18/2023 11:00 A M Medical Record Number: [id number] Patient Account Number: [id number] Date of Birth/Sex: Treating RN: 10/18/1940 (82 y.o. F) Primary Care Provider: Hoyle Sauer Other Clinician: Referring Provider: Treating Provider/Extender: Jodie Echevaria, Ravisankar R Weeks in Treatment: 3 Constitutional respirations regular, non-labored and within target range for patient.Marland Kitchen Psychiatric pleasant and cooperative. [address], [place] ([id number]) [id number] Page 2 of 6 Notes T the right side postmastectomy surgical site there is wound dehiscence to the lateral aspect with granulation tissue at the opening and undermining o circumferentially. No signs of surrounding infection including increased warmth, erythema or purulent drainage. Electronic Signature(s) Signed: 01/18/2023 2:24:30 PM By: Geralyn Corwin DO Entered By: Geralyn Corwin on 01/18/2023 11:45:52 -------------------------------------------------------------------------------- Physician Orders Details Patient Name: Date of Service: Karina Foster. 01/18/2023 11:00 A M Medical Record Number: [id number] Patient Account Number:  [id number] Date of Birth/Sex: Treating RN: 05-10-1941 (82 y.o. Gevena Foster Primary Care Provider: Hoyle Sauer Other Clinician: Referring Provider: Treating Provider/Extender: Vevelyn Royals Weeks in Treatment: 3 Verbal / Phone Orders: No Diagnosis Coding Follow-up Appointments ppointment in 1 week. - w/ Dr. Mikey Bussing and Lupita Leash Tuesday Rm # 9 01/25/23 @ 11:00 Return A Other: - Once we apply the wound vac, The nurses at [facility]  will begin changing the wound vac Friday's, and Tuesday's you will come to the wound care center and we will change it here. Wound vac will only be change twice a week, Tuesday's and Friday's. ***Just for the week of 01/10/23, [facility] will change the wound vac Monday 01/10/23 and Friday 01/14/23*** Bathing/ Shower/ Hygiene May shower with protection but do not get wound dressing(s) wet. Protect dressing(s) with water repellant cover (for example, large plastic bag) or a cast cover and may then take shower. Negative Presssure Wound Therapy Wound Vac to wound continuously at 144mm/hg pressure - Medela wound vac with white and black foam combination Black and White Foam combination Wound Treatment Wound #1 - Axilla Wound Laterality: Right Topical: BlastX 1 x Per Day/15 Days Prim Dressing: DuoDERM CGF Border Dressing Square, 6x6 (in/in) ary 1 x Per Day/15 Days Secondary Dressing: Zetuvit Plus Silicone Border Dressing 5x5 (in/in) (Generic) 1 x Per Day/15 Days Discharge Instructions: Ordering in case you need them if wound vac comes off Electronic Signature(s) Signed: 01/18/2023 2:24:30 PM By: Geralyn Corwin DO Entered By: Geralyn Corwin on 01/18/2023 11:45:59 -------------------------------------------------------------------------------- Problem List Details Patient Name: Date of Service: Karina Foster. 01/18/2023 11:00 A M Medical Record Number: [id number] Patient Account Number: [id number] Date of Birth/Sex: Treating  RN: [birth year] (82 y.o. F) Primary Care Provider: Hoyle Sauer Other Clinician: Referring Provider: Treating Provider/Extender: Vevelyn Royals Weeks in Treatment: 3 Active Problems ICD-10 Encounter Code Description Active Date MDM Diagnosis S21.001A Unspecified open wound of right breast, initial encounter 12/24/2022 No Yes LANEY, LOUDERBACK ([id number]) [id number] Page 3 of 6 C50.411 Malignant neoplasm of upper-outer quadrant of right female breast 12/24/2022 No Yes E11.622 Type 2 diabetes mellitus with other skin ulcer 12/24/2022 No Yes Inactive Problems Resolved Problems Electronic Signature(s) Signed: 01/18/2023 2:24:30 PM By: Geralyn Corwin DO Entered By: Geralyn Corwin on 01/18/2023 11:44:11 -------------------------------------------------------------------------------- Progress Note Details Patient Name: Date of Service: Karina Foster. 01/18/2023 11:00 A M Medical Record Number: [id number] Patient Account Number: [id number] Date of Birth/Sex: Treating RN: 04-Feb-1941 (82 y.o. F) Primary Care Provider: Hoyle Sauer Other Clinician: Referring Provider: Treating Provider/Extender: Vevelyn Royals Weeks in Treatment: 3 Subjective Chief Complaint Information obtained from Patient 12/24/2022; Right-sided wound status postmastectomy History of Present Illness (HPI) 12/24/2022 Ms. Karina Foster is a 82 year old female with a past medical history of controlled type 2 diabetes, right sided breast cancer status postmastectomy pursuing chemotherapy that presents the clinic for a 61-month history of nonhealing surgical wound dehiscence to the right side status postmastectomy. On 10/14/2022 patient had a right mastectomy by Dr. Donell Beers and subsequently developed cellulitis and wound dehiscence. She has been using saline wet-to-dry dressings. She currently denies signs of infection. She did not tolerate her  first round of chemotherapy and this is being placed on hold for now. She did not have radiation or implants. She does state that 27 years ago she had a lumpectomy to the site with radiation. 5/3; patient presents for follow-up. She has been approved for the wound VAC and has this present with her today. She lives at [facility] and there are nurses there that can change the wound VAC twice a week. She has been using Vashe wet-to-dry dressings. Wound is stable. 5/14; patient presents for follow-up. She has been using the wound VAC. She has some irritation to where the drape is placed. We will start Replicare to help with this issue. Patient History Information obtained from Patient, Chart. Family History Unknown  History. Social History Former smoker, Marital Status - Married, Alcohol Use - Never, Drug Use - No History, Caffeine Use - Rarely. Medical History Respiratory Patient has history of Sleep Apnea Cardiovascular Patient has history of Coronary Artery Disease, Myocardial Infarction Endocrine Patient has history of Type II Diabetes Neurologic Patient has history of Neuropathy - HANDS and feet Oncologic Patient has history of Received Chemotherapy - right breast, Received Radiation - right breat ca Hospitalization/Surgery History - right total mastectomy. - portacath placement. - breast biopsy right. - cataract extraction. - coronary angioplasty with stent placement. [address] [place] ([id number]) [id number] Page 4 of 6 Objective Constitutional respirations regular, non-labored and within target range for patient.. Vitals Time Taken: 11:14 AM, Temperature: 98.5 F, Pulse: 83 bpm, Respiratory Rate: 18 breaths/min, Blood Pressure: 122/75 mmHg, Capillary Blood Glucose: 121 mg/dl. Psychiatric pleasant and cooperative. General Notes: T the right side postmastectomy surgical site there is wound dehiscence to the lateral aspect with granulation tissue at the  opening and o undermining circumferentially. No signs of surrounding infection including increased warmth, erythema or purulent drainage. Integumentary (Hair, Skin) Wound #1 status is Open. Original cause of wound was Surgical Injury. The date acquired was: 10/14/2022. The wound has been in treatment 3 weeks. The wound is located on the Right Axilla. The wound measures 2.7cm length x 2.5cm width x 1.7cm depth; 5.301cm^2 area and 9.012cm^3 volume. There is Fat Layer (Subcutaneous Tissue) exposed. There is no tunneling or undermining noted. There is a medium amount of serosanguineous drainage noted. The wound margin is distinct with the outline attached to the wound base. There is large (67-100%) red, pink granulation within the wound bed. There is a small (1-33%) amount of necrotic tissue within the wound bed including Adherent Slough. The periwound skin appearance did not exhibit: Callus, Crepitus, Excoriation, Induration, Rash, Scarring, Dry/Scaly, Maceration, Atrophie Blanche, Cyanosis, Ecchymosis, Hemosiderin Staining, Mottled, Pallor, Rubor, Erythema. Periwound temperature was noted as No Abnormality. The periwound has tenderness on palpation. Assessment Active Problems ICD-10 Unspecified open wound of right breast, initial encounter Malignant neoplasm of upper-outer quadrant of right female breast Type 2 diabetes mellitus with other skin ulcer Patient has been tolerating the wound VAC well. The wound appears well-healing. She is having some irritation where the drape is being placed and I recommended Replicare under this. She is not having a lot of drainage in the canister. I will see her weekly. She has home health that changes the wound VAC weekly as well. Plan Follow-up Appointments: Return Appointment in 1 week. - w/ Dr. Mikey Bussing and Lupita Leash Tuesday Rm # 9 01/25/23 @ 11:00 Other: - Once we apply the wound vac, The nurses at [facility] will begin changing the wound vac Friday's, and  Tuesday's you will come to the wound care center and we will change it here. Wound vac will only be change twice a week, Tuesday's and Friday's. ***Just for the week of 01/10/23, [facility] will change the wound vac Monday 01/10/23 and Friday 01/14/23*** Bathing/ Shower/ Hygiene: May shower with protection but do not get wound dressing(s) wet. Protect dressing(s) with water repellant cover (for example, large plastic bag) or a cast cover and may then take shower. Negative Presssure Wound Therapy: Wound Vac to wound continuously at 158mm/hg pressure - Medela wound vac with white and black foam combination Black and White Foam combination WOUND #1: - Axilla Wound Laterality: Right Topical: BlastX 1 x Per Day/15 Days Prim Dressing: DuoDERM CGF Border Dressing Square, 6x6 (in/in) 1 x Per Day/15 Days ary  Secondary Dressing: Zetuvit Plus Silicone Border Dressing 5x5 (in/in) (Generic) 1 x Per Day/15 Days Discharge Instructions: Ordering in case you need them if wound vac comes off 1. Continue wound VAC 2. Follow-up in 1 week Electronic Signature(s) Signed: 01/18/2023 2:24:30 PM By: Geralyn Corwin DO Entered By: Geralyn Corwin on 01/18/2023 11:47:34 HxROS Details -------------------------------------------------------------------------------- Karina Foster ([id number]) [id number] Page 5 of 6 Patient Name: Date of Service: Karina Foster, Karina Foster 01/18/2023 11:00 A M Medical Record Number: [id number] Patient Account Number: [id number] Date of Birth/Sex: Treating RN: 1940-09-08 (82 y.o. F) Primary Care Provider: Hoyle Sauer Other Clinician: Referring Provider: Treating Provider/Extender: Vevelyn Royals Weeks in Treatment: 3 Information Obtained From Patient Chart Respiratory Medical History: Positive for: Sleep Apnea Cardiovascular Medical History: Positive for: Coronary Artery Disease; Myocardial Infarction Endocrine Medical  History: Positive for: Type II Diabetes Treated with: Insulin Neurologic Medical History: Positive for: Neuropathy - HANDS and feet Oncologic Medical History: Positive for: Received Chemotherapy - right breast; Received Radiation - right breat ca Immunizations Pneumococcal Vaccine: Received Pneumococcal Vaccination: Yes Received Pneumococcal Vaccination On or After 60th Birthday: Yes Implantable Devices Yes Hospitalization / Surgery History Type of Hospitalization/Surgery right total mastectomy portacath placement breast biopsy right cataract extraction coronary angioplasty with stent placement Family and Social History Unknown History: Yes; Former smoker; Marital Status - Married; Alcohol Use: Never; Drug Use: No History; Caffeine Use: Rarely; Financial Concerns: No; Food, Clothing or Shelter Needs: No; Support System Lacking: No; Transportation Concerns: No Electronic Signature(s) Signed: 01/18/2023 2:24:30 PM By: Geralyn Corwin DO Entered By: Geralyn Corwin on 01/18/2023 11:45:06 -------------------------------------------------------------------------------- SuperBill Details Patient Name: Date of Service: Karina Foster 01/18/2023 Medical Record Number: [id number] Patient Account Number: [id number] Date of Birth/Sex: Treating RN: November 15, 1940 (82 y.o. F) Primary Care Provider: Hoyle Sauer Other Clinician: Referring Provider: Treating Provider/Extender: Jodie Echevaria, Serafina Royals Weeks in Treatment: [address] [place] ([id number]) [id number] Page 6 of 6 Diagnosis Coding ICD-10 Codes Code Description S21.001A Unspecified open wound of right breast, initial encounter C50.411 Malignant neoplasm of upper-outer quadrant of right female breast E11.622 Type 2 diabetes mellitus with other skin ulcer Facility Procedures : CPT4 Code: [id number] Description: 32440 - WOUND VAC-50 SQ CM OR LESS ICD-10 Diagnosis Description S21.001A  Unspecified open wound of right breast, initial encounter Modifier: Quantity: 1 Physician Procedures : CPT4 Code Description Modifier [id number] - WC PHYS LEVEL 3 - EST PT ICD-10 Diagnosis Description S21.001A Unspecified open wound of right breast, initial encounter C50.411 Malignant neoplasm of upper-outer quadrant of right female breast E11.622  Type 2 diabetes mellitus with other skin ulcer Quantity: 1 : [id number] - WC PHYS TX WOUND VAC < 50 SQ CM ICD-10 Diagnosis Description S21.001A Unspecified open wound of right breast, initial encounter Quantity: 1 Electronic Signature(s) Signed: 01/18/2023 2:24:30 PM By: Geralyn Corwin DO Signed: 02/15/2023 7:49:05 AM By: Brenton Grills Entered By: Brenton Grills on 01/18/2023 11:54:14

## 2023-02-15 NOTE — Progress Notes (Signed)
[place], [place] ([id number]) [id number] Page 1 of 6 Visit Report for 01/18/2023 Arrival Information Details Patient Name: Date of Service: LANIER, FELTY 01/18/2023 11:00 A M Medical Record Number: [id number] Patient Account Number: [id number] Date of Birth/Sex: Treating RN: 09-04-1941 (82 y.o. Gevena Mart Primary Care Jasmarie Coppock: Hoyle Sauer Other Clinician: Referring Ugo Thoma: Treating Dore Oquin/Extender: Vevelyn Royals Weeks in Treatment: 3 Visit Information History Since Last Visit All ordered tests and consults were completed: Yes Patient Arrived: Ambulatory Added or deleted any medications: No Arrival Time: 11:09 Any new allergies or adverse reactions: No Accompanied By: self Had a fall or experienced change in No Transfer Assistance: None activities of daily living that may affect Patient Identification Verified: Yes risk of falls: Secondary Verification Process Completed: Yes Signs or symptoms of abuse/neglect since last visito No Patient Requires Transmission-Based Precautions: No Hospitalized since last visit: No Patient Has Alerts: No Implantable device outside of the clinic excluding No cellular tissue based products placed in the center since last visit: Has Dressing in Place as Prescribed: Yes Pain Present Now: No Electronic Signature(s) Signed: 02/15/2023 7:49:05 AM By: Brenton Grills Entered By: Brenton Grills on 01/18/2023 11:14:28 -------------------------------------------------------------------------------- Encounter Discharge Information Details Patient Name: Date of Service: [name] 01/18/2023 11:00 A M Medical Record Number: [id number] Patient Account Number: [id number] Date of Birth/Sex: Treating RN: Feb 12, 1941 (82 y.o. Gevena Mart Primary Care Kerina Simoneau: Hoyle Sauer Other Clinician: Referring Chandrika Sandles: Treating Emoni Yang/Extender: Vevelyn Royals Weeks in Treatment: 3 Encounter Discharge Information Items Discharge Condition: Stable Ambulatory Status: Ambulatory Discharge Destination: Home Transportation: Private Auto Accompanied By: self Schedule Follow-up Appointment: Yes Clinical Summary of Care: Patient Declined Electronic Signature(s) Signed: 02/15/2023 7:49:05 AM By: Brenton Grills Entered By: Brenton Grills on 01/18/2023 11:55:26 -------------------------------------------------------------------------------- Lower Extremity Assessment Details Patient Name: Date of Service: DIEGO, DELANCEY 01/18/2023 11:00 A M Medical Record Number: [id number] Patient Account Number: [id number] Date of Birth/Sex: Treating RN: [birth year] (82 y.o. Gevena Mart Primary Care Kaiyden Simkin: Hoyle Sauer Other Clinician: Referring Annamay Laymon: Treating Idona Stach/Extender: Jodie Echevaria, Ravisankar R Weeks in Treatment: 3 Electronic Signature(s) Signed: 02/15/2023 7:49:05 AM By: Frankey Shown, Signed: 02/15/2023 7:49:05 AM By: Danelle Earthly ([id number]) [id number] Page 2 of 6 Entered By: Brenton Grills on 01/18/2023 11:16:12 -------------------------------------------------------------------------------- Multi Wound Chart Details Patient Name: Date of Service: ARIYANA, FAW 01/18/2023 11:00 A M Medical Record Number: [id number] Patient Account Number: [id number] Date of Birth/Sex: Treating RN: 11/15/1940 (82 y.o. F) Primary Care Quince Santana: Hoyle Sauer Other Clinician: Referring Chandrea Zellman: Treating Brandilynn Taormina/Extender: Jodie Echevaria, Ravisankar R Weeks in Treatment: 3 Vital Signs Height(in): Capillary Blood Glucose(mg/dl): 062 Weight(lbs): Pulse(bpm): 83 Body Mass Index(BMI): Blood Pressure(mmHg): 122/75 Temperature(F): 98.5 Respiratory Rate(breaths/min): 18 [1:Photos:] [N/A:N/A] Right Axilla N/A N/A Wound Location: Surgical Injury N/A N/A Wounding  Event: Open Surgical Wound N/A N/A Primary Etiology: Sleep Apnea, Coronary Artery N/A N/A Comorbid History: Disease, Myocardial Infarction, Type II Diabetes, Neuropathy, Received Chemotherapy, Received Radiation 10/14/2022 N/A N/A Date Acquired: 3 N/A N/A Weeks of Treatment: Open N/A N/A Wound Status: No N/A N/A Wound Recurrence: 2.7x2.5x1.7 N/A N/A Measurements L x W x D (cm) 5.301 N/A N/A A (cm) : rea 9.012 N/A N/A Volume (cm) : 36.10% N/A N/A % Reduction in Area: 27.60% N/A N/A % Reduction in Volume: Full Thickness With Exposed Support N/A N/A Classification: Structures Medium N/A N/A Exudate Amount: Serosanguineous N/A N/A Exudate Type: red, brown N/A N/A Exudate Color: Distinct,  outline attached N/A N/A Wound Margin: Large (67-100%) N/A N/A Granulation Amount: Red, Pink N/A N/A Granulation Quality: Small (1-33%) N/A N/A Necrotic Amount: Fat Layer (Subcutaneous Tissue): Yes N/A N/A Exposed Structures: Fascia: No Tendon: No Muscle: No Joint: No Bone: No Small (1-33%) N/A N/A Epithelialization: Excoriation: No N/A N/A Periwound Skin Texture: Induration: No Callus: No Crepitus: No Rash: No Scarring: No Maceration: No N/A N/A Periwound Skin Moisture: Dry/Scaly: No Atrophie Blanche: No N/A N/A Periwound Skin Color: Cyanosis: No Ecchymosis: No Erythema: No Hemosiderin Staining: No Mottled: No Pallor: No Rubor: No No Abnormality N/A N/A Temperature: [place], [place] ([id number]) [id number] Page 3 of 6 Yes N/A N/A Tenderness on Palpation: Treatment Notes Electronic Signature(s) Signed: 01/18/2023 2:24:30 PM By: Geralyn Corwin DO Entered By: Geralyn Corwin on 01/18/2023 11:44:16 -------------------------------------------------------------------------------- Multi-Disciplinary Care Plan Details Patient Name: Date of Service: [name] 01/18/2023 11:00 A M Medical Record Number: [id number] Patient Account  Number: [id number] Date of Birth/Sex: Treating RN: [birth year] (82 y.o. Gevena Mart Primary Care Tavyn Kurka: Hoyle Sauer Other Clinician: Referring Selita Staiger: Treating Monasia Lair/Extender: Jodie Echevaria, Ravisankar R Weeks in Treatment: 3 Active Inactive Electronic Signature(s) Signed: 02/15/2023 7:49:05 AM By: Brenton Grills Entered By: Brenton Grills on 01/18/2023 14:20:44 -------------------------------------------------------------------------------- Negative Pressure Wound Therapy Application (NPWT) Details Patient Name: Date of Service: MAYLANIE, SCHLUTER 01/18/2023 11:00 A M Medical Record Number: [id number] Patient Account Number: [id number] Date of Birth/Sex: Treating RN: 1940-09-19 (82 y.o. Gevena Mart Primary Care Yukari Flax: Hoyle Sauer Other Clinician: Referring Dorotha Hirschi: Treating Jaileen Janelle/Extender: Jodie Echevaria, Ravisankar R Weeks in Treatment: 3 NPWT Application Performed for: Wound #1 Right Axilla Performed By: Brenton Grills, RN Type: VAC System Coverage Size (sq cm): 6.75 Pressure Type: Constant Pressure Setting: 125 mmHG Drain Type: None Primary Contact: Other Other: Duoderm Quantity of Sponges/Gauze Inserted: 1 Sponge/Dressing Type: Foam- Black Date Initiated: 01/18/2023 Response to Treatment: Tolerated well Post Procedure Diagnosis Same as Pre-procedure Electronic Signature(s) Signed: 02/15/2023 7:49:05 AM By: Brenton Grills Entered By: Brenton Grills on 01/18/2023 11:53:44 -------------------------------------------------------------------------------- Pain Assessment Details Patient Name: Date of Service: TOVIA, DORA 01/18/2023 11:00 A M Medical Record Number: [id number] Patient Account Number: [id number] Date of Birth/Sex: Treating RN: [birth year] (82 y.o. Gevena Mart Primary Care Gunter Conde: Hoyle Sauer Other Clinician: Referring Wilna Pennie: Treating Lysha Schrade/Extender: Vevelyn Royals Weeks in Treatment: [address] [place] ([id number]) [id number] Page 4 of 6 Active Problems Location of Pain Severity and Description of Pain Patient Has Paino No Site Locations Pain Management and Medication Current Pain Management: Electronic Signature(s) Signed: 02/15/2023 7:49:05 AM By: Brenton Grills Entered By: Brenton Grills on 01/18/2023 11:15:51 -------------------------------------------------------------------------------- Patient/Caregiver Education Details Patient Name: Date of Service: Aikey, SUSA N G. 5/14/2024andnbsp11:00 A M Medical Record Number: [id number] Patient Account Number: [id number] Date of Birth/Gender: Treating RN: 15-May-1941 (82 y.o. Gevena Mart Primary Care Physician: Hoyle Sauer Other Clinician: Referring Physician: Treating Physician/Extender: Otho Najjar in Treatment: 3 Education Assessment Education Provided To: Patient Education Topics Provided Wound/Skin Impairment: Methods: Explain/Verbal Responses: State content correctly Electronic Signature(s) Signed: 02/15/2023 7:49:05 AM By: Brenton Grills Entered By: Brenton Grills on 01/18/2023 11:36:30 -------------------------------------------------------------------------------- Wound Assessment Details Patient Name: Date of Service: KLOHE, SIVERSON 01/18/2023 11:00 A M Medical Record Number: [id number] Patient Account Number: [id number] Date of Birth/Sex: Treating RN: 04-20-1941 (82 y.o. Gevena Mart Primary Care Alejandria Wessells: Hoyle Sauer Other Clinician: Referring Morna Flud: Treating Selinda Korzeniewski/Extender: Jodie Echevaria, Ravisankar R Weeks in Treatment:  3 Wound Status TATE, LOGALBO ([id number]) [id number] Page 5 of 6 Wound Number: 1 Primary Open Surgical Wound Etiology: Wound Location: Right Axilla Wound Open Wounding Event: Surgical Injury Status: Date Acquired:  10/14/2022 Comorbid Sleep Apnea, Coronary Artery Disease, Myocardial Infarction, Type Weeks Of Treatment: 3 History: II Diabetes, Neuropathy, Received Chemotherapy, Received Clustered Wound: No Radiation Photos Wound Measurements Length: (cm) 2.7 Width: (cm) 2.5 Depth: (cm) 1.7 Area: (cm) 5.301 Volume: (cm) 9.012 % Reduction in Area: 36.1% % Reduction in Volume: 27.6% Epithelialization: Small (1-33%) Tunneling: No Undermining: No Wound Description Classification: Full Thickness With Exposed Support Structures Wound Margin: Distinct, outline attached Exudate Amount: Medium Exudate Type: Serosanguineous Exudate Color: red, brown Foul Odor After Cleansing: No Slough/Fibrino Yes Wound Bed Granulation Amount: Large (67-100%) Exposed Structure Granulation Quality: Red, Pink Fascia Exposed: No Necrotic Amount: Small (1-33%) Fat Layer (Subcutaneous Tissue) Exposed: Yes Necrotic Quality: Adherent Slough Tendon Exposed: No Muscle Exposed: No Joint Exposed: No Bone Exposed: No Periwound Skin Texture Texture Color No Abnormalities Noted: No No Abnormalities Noted: No Callus: No Atrophie Blanche: No Crepitus: No Cyanosis: No Excoriation: No Ecchymosis: No Induration: No Erythema: No Rash: No Hemosiderin Staining: No Scarring: No Mottled: No Pallor: No Moisture Rubor: No No Abnormalities Noted: No Dry / Scaly: No Temperature / Pain Maceration: No Temperature: No Abnormality Tenderness on Palpation: Yes Electronic Signature(s) Signed: 01/18/2023 4:28:59 PM By: Redmond Pulling RN, BSN Signed: 02/15/2023 7:49:05 AM By: Brenton Grills Entered By: Redmond Pulling on 01/18/2023 11:32:59 -------------------------------------------------------------------------------- Vitals Details Patient Name: Date of Service: [name] 01/18/2023 11:00 A M Medical Record Number: [id number] Patient Account Number: [id number] Date of Birth/Sex: Treating RN: 12-20-1940 (82 y.o. Gevena Mart Primary Care Ladavia Lindenbaum: Hoyle Sauer Other Clinician: HENLIE, SHILL ([id number]) [id number] Page 6 of 6 Referring Etoy Mcdonnell: Treating Sania Noy/Extender: Jodie Echevaria, Ravisankar R Weeks in Treatment: 3 Vital Signs Time Taken: 11:14 Temperature (F): 98.5 Pulse (bpm): 83 Respiratory Rate (breaths/min): 18 Blood Pressure (mmHg): 122/75 Capillary Blood Glucose (mg/dl): 696 Reference Range: 80 - 120 mg / dl Electronic Signature(s) Signed: 02/15/2023 7:49:05 AM By: Brenton Grills Entered By: Brenton Grills on 01/18/2023 11:15:31

## 2023-02-16 NOTE — Progress Notes (Signed)
[address] [place] ([id number]) [id number] Page 1 of 7 Visit Report for 02/15/2023 Arrival Information Details Patient Name: Date of Service: Karina Foster, Karina Foster 02/15/2023 2:00 PM Medical Record Number: [id number] Patient Account Number: [id number] Date of Birth/Sex: Treating RN: 23-Nov-1940 (82 y.o. F) Primary Care Naaman Curro: Hoyle Sauer Other Clinician: Referring Dayle Mcnerney: Treating Annebelle Bostic/Extender: Vevelyn Royals Weeks in Treatment: 7 Visit Information History Since Last Visit Added or deleted any medications: No Patient Arrived: Ambulatory Any new allergies or adverse reactions: No Arrival Time: 14:26 Had a fall or experienced change in No Accompanied By: self activities of daily living that may affect Transfer Assistance: None risk of falls: Patient Identification Verified: Yes Signs or symptoms of abuse/neglect since last visito No Secondary Verification Process Completed: Yes Hospitalized since last visit: No Patient Requires Transmission-Based Precautions: No Implantable device outside of the clinic excluding No Patient Has Alerts: No cellular tissue based products placed in the center since last visit: Has Dressing in Place as Prescribed: Yes Pain Present Now: No Electronic Signature(s) Signed: 02/15/2023 4:51:19 PM By: Thayer Dallas Entered By: Thayer Dallas on 02/15/2023 14:29:44 -------------------------------------------------------------------------------- Encounter Discharge Information Details Patient Name: Date of Service: Karina Dus. 02/15/2023 2:00 PM Medical Record Number: [id number] Patient Account Number: [id number] Date of Birth/Sex: Treating RN: October 13, 1940 (82 y.o. Gevena Mart Primary Care Diamond Jentz: Hoyle Sauer Other Clinician: Referring Lorry Anastasi: Treating Izik Bingman/Extender: Vevelyn Royals Weeks in Treatment: 7 Encounter Discharge Information Items Post Procedure  Vitals Discharge Condition: Stable Temperature (F): 98 Ambulatory Status: Ambulatory Pulse (bpm): 68 Discharge Destination: Home Respiratory Rate (breaths/min): 18 Transportation: Private Auto Blood Pressure (mmHg): 110/64 Accompanied By: self Schedule Follow-up Appointment: Yes Clinical Summary of Care: Patient Declined Electronic Signature(s) Signed: 02/15/2023 4:39:58 PM By: Brenton Grills Entered By: Brenton Grills on 02/15/2023 15:22:56 -------------------------------------------------------------------------------- Lower Extremity Assessment Details Patient Name: Date of Service: Karina Foster, Karina Foster 02/15/2023 2:00 PM Medical Record Number: [id number] Patient Account Number: [id number] Date of Birth/Sex: Treating RN: [birth year] (82 y.o. F) Primary Care Jaquann Guarisco: Hoyle Sauer Other Clinician: Referring Margarett Viti: Treating Samyah Bilbo/Extender: Jodie Echevaria, Ravisankar R Weeks in Treatment: 7 Electronic Signature(s) Signed: 02/15/2023 4:51:19 PM By: Corwin Levins, [name] ([id number]) [id number] Page 2 of 7 Entered By: Thayer Dallas on 02/15/2023 14:32:15 -------------------------------------------------------------------------------- Multi Wound Chart Details Patient Name: Date of Service: Karina Foster, Karina Foster 02/15/2023 2:00 PM Medical Record Number: [id number] Patient Account Number: [id number] Date of Birth/Sex: Treating RN: 03-06-1941 (82 y.o. F) Primary Care Yissel Habermehl: Chilton Greathouse R Other Clinician: Referring [place] Latulippe: Treating Suzi Hernan/Extender: Jodie Echevaria, Ravisankar R Weeks in Treatment: 7 Vital Signs Height(in): Pulse(bpm): 78 Weight(lbs): Blood Pressure(mmHg): 135/64 Body Mass Index(BMI): Temperature(F): 98.6 Respiratory Rate(breaths/min): 18 [1:Photos:] [N/A:N/A] Right Axilla N/A N/A Wound Location: Surgical Injury N/A N/A Wounding Event: Open Surgical Wound N/A N/A Primary Etiology: Sleep Apnea,  Coronary Artery N/A N/A Comorbid History: Disease, Myocardial Infarction, Type II Diabetes, Neuropathy, Received Chemotherapy, Received Radiation 10/14/2022 N/A N/A Date Acquired: 7 N/A N/A Weeks of Treatment: Open N/A N/A Wound Status: No N/A N/A Wound Recurrence: 1.5x1.4x0.1 N/A N/A Measurements L x W x D (cm) 1.649 N/A N/A A (cm) : rea 0.165 N/A N/A Volume (cm) : 80.10% N/A N/A % Reduction in A rea: 98.70% N/A N/A % Reduction in Volume: 12 Position 1 (o'clock): 0.5 Maximum Distance 1 (cm): Yes N/A N/A Tunneling: Full Thickness With Exposed Support N/A N/A Classification: Structures Medium N/A N/A Exudate A mount: Serosanguineous N/A N/A Exudate Type: red, brown  N/A N/A Exudate Color: Large (67-100%) N/A N/A Granulation A mount: Red, Pink N/A N/A Granulation Quality: Small (1-33%) N/A N/A Necrotic A mount: Fat Layer (Subcutaneous Tissue): Yes N/A N/A Exposed Structures: Small (1-33%) N/A N/A Epithelialization: Excoriation: No N/A N/A Periwound Skin Texture: Induration: No Callus: No Crepitus: No Rash: No Scarring: No Maceration: No N/A N/A Periwound Skin Moisture: Dry/Scaly: No Atrophie Blanche: No N/A N/A Periwound Skin Color: Cyanosis: No Ecchymosis: No Erythema: No Hemosiderin Staining: No Mottled: No Pallor: No Rubor: No Cellular or Tissue Based Product N/A N/A Procedures Performed: Treatment Notes Karina Foster, Karina Foster ([id number]) [id number] Page 3 of 7 Wound #1 (Axilla) Wound Laterality: Right Cleanser Vashe 5.8 (oz) Discharge Instruction: Cleanse the wound with Vashe prior to applying a clean dressing using gauze sponges, not tissue or cotton balls. Peri-Wound Care Topical Primary Dressing DuoDERM CGF Border Dressing Square, 6x6 (in/in) Secondary Dressing Drawtex 4x4 in Discharge Instruction: Apply over primary dressing as directed. Secured With Compression Wrap Compression Stockings Add-Ons Electronic  Signature(s) Signed: 02/15/2023 4:34:44 PM By: Geralyn Corwin DO Entered By: Geralyn Corwin on 02/15/2023 15:35:03 -------------------------------------------------------------------------------- Multi-Disciplinary Care Plan Details Patient Name: Date of Service: Karina Dus. 02/15/2023 2:00 PM Medical Record Number: [id number] Patient Account Number: [id number] Date of Birth/Sex: Treating RN: 1940-09-10 (82 y.o. Gevena Mart Primary Care Flannery Cavallero: Hoyle Sauer Other Clinician: Referring Eric Morganti: Treating Deaisha Welborn/Extender: Vevelyn Royals Weeks in Treatment: 7 Active Inactive Negative Pressure Wound Therapy Nursing Diagnoses: Knowledge deficit related to use and safety of the device Goals: Patient/caregiver agrees to and verbalizes understanding of need to use Date Initiated: 02/01/2023 Target Resolution Date: 02/12/2023 Goal Status: Active Interventions: Assess drainage (amount and color) Assess patient nutrition upon admission and as needed per policy Assess patient understanding of disease process and management Assess patient/caregiver ability to obtain necessary supplies Assess wound bed for efficacy of treatment Monitor and protect skin around the wound Provide education on use, care, and troubleshooting Treatment Activities: Incontinence or moisture care in place (i.e., Barrier creams, foley catheter, colostomy, diapers, etc.) : 02/01/2023 Referred to DME Phillippe Orlick for dressing supplies : 02/01/2023 Turning and Repositioning schedule in place : 02/01/2023 Notes: Electronic Signature(s) Signed: 02/15/2023 4:39:58 PM By: Brenton Grills Entered By: Brenton Grills on 02/15/2023 14:54:01 Spark, [name] ([id number]) [id number] Page 4 of 7 -------------------------------------------------------------------------------- Negative Pressure Wound Therapy Maintenance (NPWT) Details Patient Name: Date of Service: Karina Foster, Karina Foster 02/15/2023 2:00 PM Medical Record Number: [id number] Patient Account Number: [id number] Date of Birth/Sex: Treating RN: November 02, 1940 (82 y.o. Arta Silence Primary Care Kimberley Speece: Hoyle Sauer Other Clinician: Referring Amritha Yorke: Treating Essie Lagunes/Extender: Jodie Echevaria, Ravisankar R Weeks in Treatment: 7 NPWT Maintenance Performed for: Wound #1 Right Axilla Performed By: Shawn Stall, RN Type: VAC System Coverage Size (sq cm): 2.1 Pressure Type: Constant Pressure Setting: 125 mmHG Drain Type: None Primary Contact: Non-Adherent Sponge/Dressing Type: Foam- Black Date Initiated: 01/18/2023 Dressing Removed: Yes Quantity of Sponges/Gauze Removed: x1 black foam Canister Changed: No Canister Exudate Volume: 25 Dressing Reapplied: Yes Quantity of Sponges/Gauze Inserted: x1 black foam Respones T Treatment: o tolerated well Days On NPWT : 29 Post Procedure Diagnosis Same as Pre-procedure Electronic Signature(s) Signed: 02/15/2023 6:01:11 PM By: Shawn Stall RN, BSN Entered By: Shawn Stall on 02/15/2023 17:08:45 -------------------------------------------------------------------------------- Pain Assessment Details Patient Name: Date of Service: Karina Dus. 02/15/2023 2:00 PM Medical Record Number: [id number] Patient Account Number: [id number] Date of Birth/Sex: Treating RN: 1941-03-10 (82 y.o. F) Primary Care Karmella Bouvier: Hoyle Sauer  Other Clinician: Referring Ciaran Begay: Treating Margaretann Abate/Extender: Jodie Echevaria, Ravisankar R Weeks in Treatment: 7 Active Problems Location of Pain Severity and Description of Pain Patient Has Paino No Site Locations Pain Management and Medication Current Pain Management: Karina Foster, Karina Foster ([id number]) [id number] Page 5 of 7 Electronic Signature(s) Signed: 02/15/2023 4:51:19 PM By: Thayer Dallas Entered By: Thayer Dallas on 02/15/2023  14:32:07 -------------------------------------------------------------------------------- Patient/Caregiver Education Details Patient Name: Date of Service: Karina Dus 6/11/2024andnbsp2:00 PM Medical Record Number: [id number] Patient Account Number: [id number] Date of Birth/Gender: Treating RN: 07/05/1941 (82 y.o. Gevena Mart Primary Care Physician: Hoyle Sauer Other Clinician: Referring Physician: Treating Physician/Extender: Otho Najjar in Treatment: 7 Education Assessment Education Provided To: Patient Education Topics Provided Wound/Skin Impairment: Methods: Explain/Verbal Responses: State content correctly Electronic Signature(s) Signed: 02/15/2023 4:39:58 PM By: Brenton Grills Entered By: Brenton Grills on 02/15/2023 14:54:25 -------------------------------------------------------------------------------- Wound Assessment Details Patient Name: Date of Service: Karina Foster, Karina Foster 02/15/2023 2:00 PM Medical Record Number: [id number] Patient Account Number: [id number] Date of Birth/Sex: Treating RN: 1941-06-12 (82 y.o. F) Primary Care Tekila Caillouet: Hoyle Sauer Other Clinician: Referring Solace Wendorff: Treating Lilymarie Scroggins/Extender: Jodie Echevaria, Ravisankar R Weeks in Treatment: 7 Wound Status Wound Number: 1 Primary Open Surgical Wound Etiology: Wound Location: Right Axilla Wound Open Wounding Event: Surgical Injury Status: Date Acquired: 10/14/2022 Comorbid Sleep Apnea, Coronary Artery Disease, Myocardial Infarction, Type Weeks Of Treatment: 7 History: II Diabetes, Neuropathy, Received Chemotherapy, Received Clustered Wound: No Radiation Photos Wound Measurements Length: (cm) 1.5 Width: (cm) 1.4 Karina Foster, Karina Foster ([id number]) Depth: (cm) Area: (cm) Volume: (cm) % Reduction in Area: 80.1% % Reduction in Volume: 98.7% [id number] Page 6 of 7 0.1 Epithelialization: Small (1-33%) 1.649  Tunneling: Yes 0.165 Position (o'clock): 12 Maximum Distance: (cm) 0.5 Undermining: No Wound Description Classification: Full Thickness With Exposed Suppo Exudate Amount: Medium Exudate Type: Serosanguineous Exudate Color: red, brown rt Structures Wound Bed Granulation Amount: Large (67-100%) Exposed Structure Granulation Quality: Red, Pink Fat Layer (Subcutaneous Tissue) Exposed: Yes Necrotic Amount: Small (1-33%) Necrotic Quality: Adherent Slough Periwound Skin Texture Texture Color No Abnormalities Noted: No No Abnormalities Noted: No Callus: No Atrophie Blanche: No Crepitus: No Cyanosis: No Excoriation: No Ecchymosis: No Induration: No Erythema: No Rash: No Hemosiderin Staining: No Scarring: No Mottled: No Pallor: No Moisture Rubor: No No Abnormalities Noted: No Dry / Scaly: No Maceration: No Treatment Notes Wound #1 (Axilla) Wound Laterality: Right Cleanser Vashe 5.8 (oz) Discharge Instruction: Cleanse the wound with Vashe prior to applying a clean dressing using gauze sponges, not tissue or cotton balls. Peri-Wound Care Topical Primary Dressing DuoDERM CGF Border Dressing Square, 6x6 (in/in) Secondary Dressing Drawtex 4x4 in Discharge Instruction: Apply over primary dressing as directed. Secured With Compression Wrap Compression Stockings Facilities manager) Signed: 02/15/2023 4:51:19 PM By: Thayer Dallas Entered By: Thayer Dallas on 02/15/2023 14:54:53 -------------------------------------------------------------------------------- Vitals Details Patient Name: Date of Service: Karina Dus. 02/15/2023 2:00 PM Medical Record Number: [id number] Patient Account Number: [id number] Date of Birth/Sex: Treating RN: 24-Sep-1940 (82 y.o. F) Primary Care Jordon Bourquin: Hoyle Sauer Other Clinician: Referring Unika Nazareno: Treating Patrick Sohm/Extender: Vevelyn Royals Weeks in Treatment: 63 Karina Foster, [place] Foster ([id number])  [id number] Page 7 of 7 Vital Signs Time Taken: 14:31 Temperature (F): 98.6 Pulse (bpm): 78 Respiratory Rate (breaths/min): 18 Blood Pressure (mmHg): 135/64 Reference Range: 80 - 120 mg / dl Electronic Signature(s) Signed: 02/15/2023 4:51:19 PM By: Thayer Dallas Entered By: Thayer Dallas on 02/15/2023 14:32:01

## 2023-02-16 NOTE — Progress Notes (Addendum)
[address] [place] ([id number]) [id number] Page 1 of 7 Visit Report for 02/15/2023 Chief Complaint Document Details Patient Name: Date of Service: Karina Foster, Karina Foster 02/15/2023 2:00 PM Medical Record Number: [id number] Patient Account Number: [id number] Date of Birth/Sex: Treating RN: Jun 05, 1941 (82 y.o. F) Primary Care Provider: Hoyle Sauer Other Clinician: Referring Provider: Treating Provider/Extender: Jodie Echevaria, Ravisankar R Weeks in Treatment: 7 Information Obtained from: Patient Chief Complaint 12/24/2022; Right-sided wound status postmastectomy Electronic Signature(s) Signed: 02/15/2023 4:34:44 PM By: Geralyn Corwin DO Entered By: Geralyn Corwin on 02/15/2023 15:35:11 -------------------------------------------------------------------------------- Cellular or Tissue Based Product Details Patient Name: Date of Service: Karina Foster, Karina Foster 02/15/2023 2:00 PM Medical Record Number: [id number] Patient Account Number: [id number] Date of Birth/Sex: Treating RN: November 25, 1940 (82 y.o. Arta Silence Primary Care Provider: Hoyle Sauer Other Clinician: Referring Provider: Treating Provider/Extender: Vevelyn Royals Weeks in Treatment: 7 Cellular or Tissue Based Product Type Wound #1 Right Axilla Applied to: Performed By: Physician Geralyn Corwin, DO Cellular or Tissue Based Product Type: Theraskin Level of Consciousness (Pre-procedure): Awake and Alert Pre-procedure Verification/Time Out Yes - 15:10 Taken: Location: trunk / arms / legs Wound Size (sq cm): 2.1 Product Size (sq cm): 6 Waste Size (sq cm): 0 Amount of Product Applied (sq cm): 6 Instrument Used: Curette Lot #: [id number] Order #: [id number] Expiration Date: 10/10/2027 Fenestrated: Yes Instrument: Blade Reconstituted: Yes Solution Type: NS Solution Amount: 5ml Lot #: [id number] Solution Expiration Date: 09/27/2026 Secured: Yes Secured With:  Steri-Strips Dressing Applied: Yes Primary Dressing: Adaptic Procedural Pain: 0 Post Procedural Pain: 0 Response to Treatment: Procedure was tolerated well Level of Consciousness (Post- Awake and Alert procedure): Post Procedure Diagnosis Same as Pre-procedure Notes Scribed for Dr Mikey Bussing by Brenton Grills RN. [address] [place] ([id number]) [id number] Page 2 of 7 Electronic Signature(s) Signed: 02/15/2023 6:01:11 PM By: Shawn Stall RN, BSN Signed: 02/16/2023 9:06:47 AM By: Geralyn Corwin DO Previous Signature: 02/15/2023 3:34:20 PM Version By: Geralyn Corwin DO Previous Signature: 02/15/2023 4:39:58 PM Version By: Brenton Grills Entered By: Shawn Stall on 02/15/2023 17:07:48 -------------------------------------------------------------------------------- HPI Details Patient Name: Date of Service: Karina Dus. 02/15/2023 2:00 PM Medical Record Number: [id number] Patient Account Number: [id number] Date of Birth/Sex: Treating RN: 1940/09/26 (82 y.o. F) Primary Care Provider: Hoyle Sauer Other Clinician: Referring Provider: Treating Provider/Extender: Vevelyn Royals Weeks in Treatment: 7 History of Present Illness HPI Description: 12/24/2022 [name] is a 82 year old female with a past medical history of controlled type 2 diabetes, right sided breast cancer status postmastectomy pursuing chemotherapy that presents the clinic for a 54-month history of nonhealing surgical wound dehiscence to the right side status postmastectomy. On 10/14/2022 patient had a right mastectomy by Dr. Donell Beers and subsequently developed cellulitis and wound dehiscence. She has been using saline wet-to-dry dressings. She currently denies signs of infection. She did not tolerate her first round of chemotherapy and this is being placed on hold for now. She did not have radiation or implants. She does state that 27 years ago she had a lumpectomy to  the site with radiation. 5/3; patient presents for follow-up. She has been approved for the wound VAC and has this present with her today. [facility] lives at [facility] and there are nurses there that can change the wound VAC twice a week. She has been using Vashe wet-to-dry dressings. Wound is stable. 5/14; patient presents for follow-up. She has been using the wound VAC. She has some irritation to where the drape is  placed. We will start Replicare to help with this issue. 5/21; patient presents for follow-up. She has been using the wound VAC. The irritation to the periwound has improved nicely with Replicare/DuoDERM. Wound is smaller. She has no issues or complaints today. 5/28; patient presents for follow-up. We have been using the wound VAC. She has no issues or complaints today. Wound is smaller. She has slough accumulation. 6/4; patient has been using a wound VAC and the wound actually looks quite healthy. The 1 cm superior tunnel is probably about the same. Some adherent slough and fibrinous debris on the surface of the wound. There has been problems with wound VAC supplies specifically she has no black foam 6/11; patient presents for follow-up. She has been approved for TheraSkin and she was agreeable to having this placed today. She has been using the wound VAC without issues. Electronic Signature(s) Signed: 02/15/2023 4:34:44 PM By: Geralyn Corwin DO Entered By: Geralyn Corwin on 02/15/2023 15:35:43 -------------------------------------------------------------------------------- Physical Exam Details Patient Name: Date of Service: Karina Foster, Karina Foster 02/15/2023 2:00 PM Medical Record Number: [id number] Patient Account Number: [id number] Date of Birth/Sex: Treating RN: [birth year] (82 y.o. F) Primary Care Provider: Hoyle Sauer Other Clinician: Referring Provider: Treating Provider/Extender: Jodie Echevaria, Ravisankar R Weeks in Treatment: 7 Constitutional respirations  regular, non-labored and within target range for patient.Marland Kitchen Psychiatric pleasant and cooperative. Notes Right side postmastectomy surgical site towards the right axilla with open wound with granulation tissue and slough. No signs of surrounding infection. Electronic Signature(s) Signed: 02/15/2023 4:34:44 PM By: Geralyn Corwin DO Entered By: Geralyn Corwin on 02/15/2023 15:36:41 Whack, [name] ([id number]) [id number] Page 3 of 7 -------------------------------------------------------------------------------- Physician Orders Details Patient Name: Date of Service: Karina Foster, Karina Foster 02/15/2023 2:00 PM Medical Record Number: [id number] Patient Account Number: [id number] Date of Birth/Sex: Treating RN: 1941/07/28 (82 y.o. Gevena Mart Primary Care Provider: Hoyle Sauer Other Clinician: Referring Provider: Treating Provider/Extender: Vevelyn Royals Weeks in Treatment: 7 Verbal / Phone Orders: No Diagnosis Coding Follow-up Appointments ppointment in 1 week. - w/ Dr. Mikey Bussing and Lupita Leash Tuesday Rm # 9 Return A Other: - Once we apply the wound vac, The nurses at [facility] will begin changing the wound vac Friday's, and Tuesday's you will come to the wound care center and we will change it here. DO NOT REMOVE SKIN SUB OR ADAPTIC OR STERI STRIPS. Wound vac will only be change twice a week, Tuesday's and Friday's. ***Just for the week of 01/10/23, [facility] will change the wound vac Monday 01/10/23 and Friday 01/14/23*** Cellular or Tissue Based Products Wound #1 Right Axilla daptic or Mepitel. (DO NOT REMOVE). - Cellular or Tissue Based Product applied to wound bed, secured with steri-strips, cover with A THERASKIN # 1 APPLIED - DO NOT REMOVE SKIN SUB OR ADAPTIC OR STERI STRIPS. Bathing/ Shower/ Hygiene May shower with protection but do not get wound dressing(s) wet. Protect dressing(s) with water repellant cover (for example,  large plastic bag) or a cast cover and may then take shower. Negative Presssure Wound Therapy Wound Vac to wound continuously at 132mm/hg pressure - Medela wound vac with white and black foam combination. HOLD for week of 02/08/23 only. Black and White Foam combination Wound Treatment Wound #1 - Axilla Wound Laterality: Right Cleanser: Vashe 5.8 (oz) 1 x Per Day/15 Days Discharge Instructions: Cleanse the wound with Vashe prior to applying a clean dressing using gauze sponges, not tissue or cotton balls. Prim Dressing: DuoDERM CGF Border Dressing Square, 6x6 (in/in) (  Generic) 1 x Per Day/15 Days ary Secondary Dressing: Drawtex 4x4 in 1 x Per Day/15 Days Discharge Instructions: Apply over primary dressing as directed. Electronic Signature(s) Signed: 02/15/2023 4:34:44 PM By: Geralyn Corwin DO Previous Signature: 02/15/2023 3:34:20 PM Version By: Geralyn Corwin DO Entered By: Geralyn Corwin on 02/15/2023 15:36:49 -------------------------------------------------------------------------------- Problem List Details Patient Name: Date of Service: Karina Dus. 02/15/2023 2:00 PM Medical Record Number: [id number] Patient Account Number: [id number] Date of Birth/Sex: Treating RN: 1941-09-02 (82 y.o. Gevena Mart Primary Care Provider: Hoyle Sauer Other Clinician: Referring Provider: Treating Provider/Extender: Vevelyn Royals Weeks in Treatment: 7 Active Problems ICD-10 Encounter Code Description Active Date MDM Diagnosis S21.001A Unspecified open wound of right breast, initial encounter 12/24/2022 No Yes Karina Foster, Karina Foster ([id number]) [id number] Page 4 of 7 [phone] Non-pressure chronic ulcer of skin of other sites with fat layer exposed 02/01/2023 No Yes C50.411 Malignant neoplasm of upper-outer quadrant of right female breast 12/24/2022 No Yes E11.622 Type 2 diabetes mellitus with other skin ulcer 12/24/2022 No Yes Inactive  Problems Resolved Problems Electronic Signature(s) Signed: 02/15/2023 4:34:44 PM By: Geralyn Corwin DO Previous Signature: 02/15/2023 3:34:20 PM Version By: Geralyn Corwin DO Entered By: Geralyn Corwin on 02/15/2023 15:34:58 -------------------------------------------------------------------------------- Progress Note Details Patient Name: Date of Service: Karina Dus. 02/15/2023 2:00 PM Medical Record Number: [id number] Patient Account Number: [id number] Date of Birth/Sex: Treating RN: 1940-10-26 (82 y.o. F) Primary Care Provider: Hoyle Sauer Other Clinician: Referring Provider: Treating Provider/Extender: Vevelyn Royals Weeks in Treatment: 7 Subjective Chief Complaint Information obtained from Patient 12/24/2022; Right-sided wound status postmastectomy History of Present Illness (HPI) 12/24/2022 [name] is a 82 year old female with a past medical history of controlled type 2 diabetes, right sided breast cancer status postmastectomy pursuing chemotherapy that presents the clinic for a 72-month history of nonhealing surgical wound dehiscence to the right side status postmastectomy. On 10/14/2022 patient had a right mastectomy by Dr. Donell Beers and subsequently developed cellulitis and wound dehiscence. She has been using saline wet-to-dry dressings. She currently denies signs of infection. She did not tolerate her first round of chemotherapy and this is being placed on hold for now. She did not have radiation or implants. She does state that 27 years ago she had a lumpectomy to the site with radiation. 5/3; patient presents for follow-up. She has been approved for the wound VAC and has this present with her today. She lives at [facility] and there are nurses there that can change the wound VAC twice a week. She has been using Vashe wet-to-dry dressings. Wound is stable. 5/14; patient presents for follow-up. She has been using the wound VAC. She has  some irritation to where the drape is placed. We will start Replicare to help with this issue. 5/21; patient presents for follow-up. She has been using the wound VAC. The irritation to the periwound has improved nicely with Replicare/DuoDERM. Wound is smaller. She has no issues or complaints today. 5/28; patient presents for follow-up. We have been using the wound VAC. She has no issues or complaints today. Wound is smaller. She has slough accumulation. 6/4; patient has been using a wound VAC and the wound actually looks quite healthy. The 1 cm superior tunnel is probably about the same. Some adherent slough and fibrinous debris on the surface of the wound. There has been problems with wound VAC supplies specifically she has no black foam 6/11; patient presents for follow-up. She has been approved for [employer] and  she was agreeable to having this placed today. She has been using the wound VAC without issues. Patient History Information obtained from Patient, Chart. Family History Unknown History. Social History Former smoker, Marital Status - Married, Alcohol Use - Never, Drug Use - No History, Caffeine Use - Rarely. Medical History Respiratory Patient has history of Sleep Apnea Cardiovascular KRISTALEE, [place] ([id number]) [id number] Page 5 of 7 Patient has history of Coronary Artery Disease, Myocardial Infarction Endocrine Patient has history of Type II Diabetes Neurologic Patient has history of Neuropathy - HANDS and feet Oncologic Patient has history of Received Chemotherapy - right breast, Received Radiation - right breat ca Hospitalization/Surgery History - right total mastectomy. - portacath placement. - breast biopsy right. - cataract extraction. - coronary angioplasty with stent placement. Objective Constitutional respirations regular, non-labored and within target range for patient.. Vitals Time Taken: 2:31 PM, Temperature: 98.6 F, Pulse: 78 bpm,  Respiratory Rate: 18 breaths/min, Blood Pressure: 135/64 mmHg. Psychiatric pleasant and cooperative. General Notes: Right side postmastectomy surgical site towards the right axilla with open wound with granulation tissue and slough. No signs of surrounding infection. Integumentary (Hair, Skin) Wound #1 status is Open. Original cause of wound was Surgical Injury. The date acquired was: 10/14/2022. The wound has been in treatment 7 weeks. The wound is located on the Right Axilla. The wound measures 1.5cm length x 1.4cm width x 0.1cm depth; 1.649cm^2 area and 0.165cm^3 volume. There is Fat Layer (Subcutaneous Tissue) exposed. There is no undermining noted, however, there is tunneling at 12:00 with a maximum distance of 0.5cm. There is a medium amount of serosanguineous drainage noted. There is large (67-100%) red, pink granulation within the wound bed. There is a small (1-33%) amount of necrotic tissue within the wound bed including Adherent Slough. The periwound skin appearance did not exhibit: Callus, Crepitus, Excoriation, Induration, Rash, Scarring, Dry/Scaly, Maceration, Atrophie Blanche, Cyanosis, Ecchymosis, Hemosiderin Staining, Mottled, Pallor, Rubor, Erythema. 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 I debrided nonviable tissue. TheraSkin was placed in standard fashion. We continued the wound VAC. Follow-up in 1 week. Procedures Wound #1 Pre-procedure diagnosis of Wound #1 is an Open Surgical Wound located on the Right Axilla. A skin graft procedure using a bioengineered skin substitute/cellular or tissue based product was performed by Geralyn Corwin, DO with the following instrument(s): Curette. Theraskin was  applied and secured with Steri-Strips. 6 sq cm of product was utilized and 0 sq cm was wasted. Post Application, Adaptic was applied. A Time Out was conducted at 15:10, prior to the start of the procedure. The procedure was tolerated well with a pain level of 0 throughout and a pain level of 0 following the procedure. Post procedure Diagnosis Wound #1: Same as Pre-Procedure General Notes: Scribed for Dr Mikey Bussing by Brenton Grills RN. Plan Follow-up Appointments: Return Appointment in 1 week. - w/ Dr. Mikey Bussing and Lupita Leash Tuesday Rm # 9 Other: - Once we apply the wound vac, The nurses at [facility] will begin changing the wound vac Friday's, and Tuesday's you will come to the wound care center and we will change it here. DO NOT REMOVE SKIN SUB OR ADAPTIC OR STERI STRIPS. Wound vac will only be change twice a week, Tuesday's and Friday's. ***Just for the week of 01/10/23, [facility] will change the wound  vac Monday 01/10/23 and Friday 01/14/23*** Cellular or Tissue Based Products: MARGOT, PFINGSTEN ([id number]) [id number] Page 6 of 7 Wound #1 Right Axilla: Cellular or Tissue Based Product applied to wound bed, secured with steri-strips, cover with Adaptic or Mepitel. (DO NOT REMOVE). - THERASKIN # 1 APPLIED - DO NOT REMOVE SKIN SUB OR ADAPTIC OR STERI STRIPS. Bathing/ Shower/ Hygiene: May shower with protection but do not get wound dressing(s) wet. Protect dressing(s) with water repellant cover (for example, large plastic bag) or a cast cover and may then take shower. Negative Presssure Wound Therapy: Wound Vac to wound continuously at 175mm/hg pressure - Medela wound vac with white and black foam combination. HOLD for week of 02/08/23 only. Black and White Foam combination WOUND #1: - Axilla Wound Laterality: Right Cleanser: Vashe 5.8 (oz) 1 x Per Day/15 Days Discharge Instructions: Cleanse the wound with Vashe prior to applying a clean dressing using gauze sponges, not  tissue or cotton balls. Prim Dressing: DuoDERM CGF Border Dressing Square, 6x6 (in/in) (Generic) 1 x Per Day/15 Days ary Secondary Dressing: Drawtex 4x4 in 1 x Per Day/15 Days Discharge Instructions: Apply over primary dressing as directed. 1. In office sharp debridement 2. TheraSkin placed in standard fashion 3. Continue wound VAC 4. Follow-up in 1 week Electronic Signature(s) Signed: 02/15/2023 6:01:11 PM By: Shawn Stall RN, BSN Signed: 02/16/2023 9:06:47 AM By: Geralyn Corwin DO Previous Signature: 02/15/2023 4:34:44 PM Version By: Geralyn Corwin DO Entered By: Shawn Stall on 02/15/2023 17:13:40 -------------------------------------------------------------------------------- HxROS Details Patient Name: Date of Service: Karina Dus. 02/15/2023 2:00 PM Medical Record Number: [id number] Patient Account Number: [id number] Date of Birth/Sex: Treating RN: 05-Feb-1941 (82 y.o. F) Primary Care Provider: Hoyle Sauer Other Clinician: Referring Provider: Treating Provider/Extender: Jodie Echevaria, Ravisankar R Weeks in Treatment: 7 Information Obtained From Patient Chart Respiratory Medical History: Positive for: Sleep Apnea Cardiovascular Medical History: Positive for: Coronary Artery Disease; Myocardial Infarction Endocrine Medical History: Positive for: Type II Diabetes Treated with: Insulin Neurologic Medical History: Positive for: Neuropathy - HANDS and feet Oncologic Medical History: Positive for: Received Chemotherapy - right breast; Received Radiation - right breat ca Immunizations Pneumococcal Vaccine: Received Pneumococcal Vaccination: Yes Received Pneumococcal Vaccination On or After 60th Birthday: Yes Implantable [address] [place], [place] ([id number]) [phone].pdf Page 7 of 7 Yes Hospitalization / Surgery History Type of Hospitalization/Surgery right total mastectomy portacath placement breast biopsy  right cataract extraction coronary angioplasty with stent placement Family and Social History Unknown History: Yes; Former smoker; Marital Status - Married; Alcohol Use: Never; Drug Use: No History; Caffeine Use: Rarely; Financial Concerns: No; Food, Clothing or Shelter Needs: No; Support System Lacking: No; Transportation Concerns: No Electronic Signature(s) Signed: 02/15/2023 4:34:44 PM By: Geralyn Corwin DO Entered By: Geralyn Corwin on 02/15/2023 15:35:49 -------------------------------------------------------------------------------- SuperBill Details Patient Name: Date of Service: Karina Dus 02/15/2023 Medical Record Number: [id number] Patient Account Number: [id number] Date of Birth/Sex: Treating RN: Apr 01, 1941 (82 y.o. Gevena Mart Primary Care Provider: Hoyle Sauer Other Clinician: Referring Provider: Treating Provider/Extender: Vevelyn Royals Weeks in Treatment: 7 Diagnosis Coding ICD-10 Codes Code Description S21.001A Unspecified open wound of right breast, initial encounter L98.492 Non-pressure chronic ulcer of skin of other sites with fat layer exposed C50.411 Malignant neoplasm of upper-outer quadrant of right female breast E11.622 Type 2 diabetes mellitus with other skin ulcer Facility Procedures : CPT4 Code: [id number] Description: Q4121- Theraskin per 1sq cm extra small -6 sq cm Modifier: Quantity: 6 : CPT4 Code: [id number] Description: 15271 -  SKIN SUB GRAFT TRNK/ARM/LEG ICD-10 Diagnosis Description L98.492 Non-pressure chronic ulcer of skin of other sites with fat layer exposed S21.001A Unspecified open wound of right breast, initial encounter E11.622 Type 2 diabetes  mellitus with other skin ulcer Modifier: Quantity: 1 Physician Procedures : CPT4 Code Description Modifier [id number] 15271 - WC PHYS SKIN SUB GRAFT TRNK/ARM/LEG ICD-10 Diagnosis Description L98.492 Non-pressure chronic ulcer of skin of other sites with fat  layer exposed S21.001A Unspecified open wound of right breast, initial  encounter E11.622 Type 2 diabetes mellitus with other skin ulcer Quantity: 1 Electronic Signature(s) Signed: 02/15/2023 6:01:11 PM By: Shawn Stall RN, BSN Signed: 02/16/2023 9:06:47 AM By: Geralyn Corwin DO Previous Signature: 02/15/2023 4:34:44 PM Version By: Geralyn Corwin DO Previous Signature: 02/15/2023 3:34:20 PM Version By: Geralyn Corwin DO Entered By: Shawn Stall on 02/15/2023 17:13:25

## 2023-02-21 ENCOUNTER — Encounter (HOSPITAL_BASED_OUTPATIENT_CLINIC_OR_DEPARTMENT_OTHER): Payer: Medicare Other | Admitting: Internal Medicine

## 2023-02-21 DIAGNOSIS — E11622 Type 2 diabetes mellitus with other skin ulcer: Secondary | ICD-10-CM | POA: Diagnosis not present

## 2023-02-21 DIAGNOSIS — S21001A Unspecified open wound of right breast, initial encounter: Secondary | ICD-10-CM

## 2023-02-21 DIAGNOSIS — L98492 Non-pressure chronic ulcer of skin of other sites with fat layer exposed: Secondary | ICD-10-CM

## 2023-02-21 DIAGNOSIS — C50411 Malignant neoplasm of upper-outer quadrant of right female breast: Secondary | ICD-10-CM | POA: Diagnosis not present

## 2023-02-21 DIAGNOSIS — I2510 Atherosclerotic heart disease of native coronary artery without angina pectoris: Secondary | ICD-10-CM | POA: Diagnosis not present

## 2023-02-21 DIAGNOSIS — E1140 Type 2 diabetes mellitus with diabetic neuropathy, unspecified: Secondary | ICD-10-CM | POA: Diagnosis not present

## 2023-02-24 ENCOUNTER — Encounter: Payer: Self-pay | Admitting: *Deleted

## 2023-02-25 ENCOUNTER — Ambulatory Visit: Payer: Medicare Other | Admitting: Hematology and Oncology

## 2023-02-25 ENCOUNTER — Ambulatory Visit: Payer: Medicare Other

## 2023-02-25 ENCOUNTER — Other Ambulatory Visit: Payer: Medicare Other

## 2023-02-25 NOTE — Progress Notes (Signed)
[address] [place] ([id number]) [id number] Page 1 of 7 Visit Report for 02/21/2023 Arrival Information Details Patient Name: Date of Service: Karina Foster, Karina Foster 02/21/2023 3:45 PM Medical Record Number: [id number] Patient Account Number: [id number] Date of Birth/Sex: Treating RN: 03-Nov-1940 (82 y.o. Katrinka Blazing Primary Care Dorma Altman: Hoyle Sauer Other Clinician: Referring Jorge Retz: Treating Rozann Holts/Extender: Vevelyn Royals Weeks in Treatment: 8 Visit Information History Since Last Visit Added or deleted any medications: No Patient Arrived: Ambulatory Any new allergies or adverse reactions: No Arrival Time: 16:03 Had a fall or experienced change in No Accompanied By: self activities of daily living that may affect Transfer Assistance: None risk of falls: Patient Identification Verified: Yes Signs or symptoms of abuse/neglect since last visito No Patient Requires Transmission-Based Precautions: No Hospitalized since last visit: No Patient Has Alerts: No Has Dressing in Place as Prescribed: Yes Pain Present Now: Yes Electronic Signature(s) Signed: 02/22/2023 12:13:42 PM By: Karie Schwalbe RN Entered By: Karie Schwalbe on 02/21/2023 16:03:57 -------------------------------------------------------------------------------- Encounter Discharge Information Details Patient Name: Date of Service: Karina Foster 02/21/2023 3:45 PM Medical Record Number: [id number] Patient Account Number: [id number] Date of Birth/Sex: Treating RN: September 29, 1940 (82 y.o. Gevena Mart Primary Care Bluma Buresh: Hoyle Sauer Other Clinician: Referring Trashawn Oquendo: Treating Deerica Waszak/Extender: Vevelyn Royals Weeks in Treatment: 8 Encounter Discharge Information Items Post Procedure Vitals Discharge Condition: Stable Temperature (F): 98.4 Ambulatory Status: Ambulatory Pulse (bpm): 78 Discharge Destination: Home Respiratory  Rate (breaths/min): 18 Transportation: Private Auto Blood Pressure (mmHg): 142/68 Accompanied By: self Schedule Follow-up Appointment: Yes Clinical Summary of Care: Patient Declined Electronic Signature(s) Signed: 02/25/2023 10:19:19 AM By: Brenton Grills Entered By: Brenton Grills on 02/21/2023 16:56:54 [place], [name] ([id number]) [id number] Page 2 of 7 -------------------------------------------------------------------------------- Lower Extremity Assessment Details Patient Name: Date of Service: Karina Foster, Karina Foster 02/21/2023 3:45 PM Medical Record Number: [id number] Patient Account Number: [id number] Date of Birth/Sex: Treating RN: 05/04/1941 (82 y.o. Katrinka Blazing Primary Care Siddhant Hashemi: Hoyle Sauer Other Clinician: Referring Rexann Lueras: Treating Ambika Zettlemoyer/Extender: Jodie Echevaria, Ravisankar R Weeks in Treatment: 8 Electronic Signature(s) Signed: 02/22/2023 12:13:42 PM By: Karie Schwalbe RN Entered By: Karie Schwalbe on 02/21/2023 16:05:19 -------------------------------------------------------------------------------- Multi Wound Chart Details Patient Name: Date of Service: Karina Foster 02/21/2023 3:45 PM Medical Record Number: [id number] Patient Account Number: [id number] Date of Birth/Sex: Treating RN: [birth year] (82 y.o. F) Primary Care Hudsyn Barich: Hoyle Sauer Other Clinician: Referring Christa Fasig: Treating Damaso Laday/Extender: Jodie Echevaria, Ravisankar R Weeks in Treatment: 8 Vital Signs Height(in): 67 Capillary Blood Glucose(mg/dl): 536 Weight(lbs): 644 Pulse(bpm): 81 Body Mass Index(BMI): 23.5 Blood Pressure(mmHg): 120/75 Temperature(F): 99 Respiratory Rate(breaths/min): 18 [1:Photos:] [N/A:N/A] Right Axilla N/A N/A Wound Location: Surgical Injury N/A N/A Wounding Event: Open Surgical Wound N/A N/A Primary Etiology: Sleep Apnea, Coronary Artery N/A N/A Comorbid History: Disease, Myocardial Infarction,  Type II Diabetes, Neuropathy, Received Chemotherapy, Received Radiation 10/14/2022 N/A N/A Date Acquired: 8 N/A N/A Weeks of Treatment: Open N/A N/A Wound Status: No N/A N/A Wound Recurrence: 1.4x1.3x0.1 N/A N/A Measurements L x W x D (cm) 1.429 N/A N/A A (cm) : rea 0.143 N/A N/A Volume (cm) : 82.80% N/A N/A % Reduction in A rea: 98.90% N/A N/A % Reduction in Volume: 12 Position 1 (o'clock): 0.5 Maximum Distance 1 (cm): Yes N/A N/A Tunneling: Full Thickness With Exposed Support N/A N/A Classification: Structures Medium N/A N/A Exudate A mount: Serosanguineous N/A N/A Exudate Type: red, brown N/A N/A Exudate Color: Large (67-100%) N/A N/A Granulation A mount:  Red, Pink N/A N/A Granulation Quality: Small (1-33%) N/A N/A Necrotic A mount: Fat Layer (Subcutaneous Tissue): Yes N/A N/A Exposed Structures: Small (1-33%) N/A N/A EpithelializationKIEREN, Karina Foster ([id number]) [id number] Page 3 of 7 Debridement - Selective/Open Wound N/A N/A Debridement: 16:23 N/A N/A Pre-procedure Verification/Time Out Taken: [facility] N/A N/A Tissue Debrided: Non-Viable Tissue N/A N/A Level: 1.43 N/A N/A Debridement A (sq cm): rea Curette N/A N/A Instrument: Minimum N/A N/A Bleeding: Pressure N/A N/A Hemostasis A chieved: 0 N/A N/A Procedural Pain: 0 N/A N/A Post Procedural Pain: Procedure was tolerated well N/A N/A Debridement Treatment Response: 1.4x1.3x0.1 N/A N/A Post Debridement Measurements L x W x D (cm) 0.143 N/A N/A Post Debridement Volume: (cm) Excoriation: No N/A N/A Periwound Skin Texture: Induration: No Callus: No Crepitus: No Rash: No Scarring: No Maceration: No N/A N/A Periwound Skin Moisture: Dry/Scaly: No Atrophie Blanche: No N/A N/A Periwound Skin Color: Cyanosis: No Ecchymosis: No Erythema: No Hemosiderin Staining: No Mottled: No Pallor: No Rubor: No Cellular or Tissue Based Product N/A N/A Procedures  Performed: Debridement Treatment Notes Electronic Signature(s) Signed: 02/21/2023 4:37:01 PM By: Geralyn Corwin DO Entered By: Geralyn Corwin on 02/21/2023 16:34:43 -------------------------------------------------------------------------------- Multi-Disciplinary Care Plan Details Patient Name: Date of Service: Karina Foster 02/21/2023 3:45 PM Medical Record Number: [id number] Patient Account Number: [id number] Date of Birth/Sex: Treating RN: [birth year] (82 y.o. Gevena Mart Primary Care Bennetta Rudden: Hoyle Sauer Other Clinician: Referring Adonai Helzer: Treating Jashira Cotugno/Extender: Vevelyn Royals Weeks in Treatment: 8 Active Inactive Negative Pressure Wound Therapy Nursing Diagnoses: Knowledge deficit related to use and safety of the device Goals: Patient/caregiver agrees to and verbalizes understanding of need to use Date Initiated: 02/01/2023 Target Resolution Date: 02/12/2023 Goal Status: Active Interventions: Assess drainage (amount and color) Assess patient nutrition upon admission and as needed per policy Assess patient understanding of disease process and management Assess patient/caregiver ability to obtain necessary supplies Assess wound bed for efficacy of treatment Monitor and protect skin around the wound [place] ([id number]) [id number] Page 4 of 7 Provide education on use, care, and troubleshooting Treatment Activities: Incontinence or moisture care in place (i.e., Barrier creams, foley catheter, colostomy, diapers, etc.) : 02/01/2023 Referred to DME Ladarrell Cornwall for dressing supplies : 02/01/2023 Turning and Repositioning schedule in place : 02/01/2023 Notes: Electronic Signature(s) Signed: 02/25/2023 10:19:19 AM By: Brenton Grills Entered By: Brenton Grills on 02/21/2023 16:55:29 -------------------------------------------------------------------------------- Pain Assessment Details Patient Name: Date of  Service: Karina Foster, Karina Foster 02/21/2023 3:45 PM Medical Record Number: [id number] Patient Account Number: [id number] Date of Birth/Sex: Treating RN: 04/27/1941 (82 y.o. Katrinka Blazing Primary Care Malakhai Beitler: Hoyle Sauer Other Clinician: Referring Ginger Leeth: Treating Sheng Pritz/Extender: Jodie Echevaria, Ravisankar R Weeks in Treatment: 8 Active Problems Location of Pain Severity and Description of Pain Patient Has Paino No Site Locations Pain Management and Medication Current Pain Management: Electronic Signature(s) Signed: 02/22/2023 12:13:42 PM By: Karie Schwalbe RN Entered By: Karie Schwalbe on 02/21/2023 16:05:08 -------------------------------------------------------------------------------- Patient/Caregiver Education Details Patient Name: Date of Service: Karina Foster 6/17/2024andnbsp3:45 PM Medical Record Number: [id number] Patient Account Number: [id number] Date of Birth/Gender: Treating RN: [birth year] (82 y.o. Jolette, Lana, [place] ([id number]) [id number].pdf Page 5 of 7 Primary Care Physician: Hoyle Sauer Other Clinician: Referring Physician: Treating Physician/Extender: Vevelyn Royals Weeks in Treatment: 8 Education Assessment Education Provided To: Patient Education Topics Provided Wound/Skin Impairment: Methods: Explain/Verbal Responses: State content correctly Electronic Signature(s) Signed: 02/25/2023 10:19:19 AM By: Brenton Grills Entered By: Brenton Grills  on 02/21/2023 16:55:54 -------------------------------------------------------------------------------- Wound Assessment Details Patient Name: Date of Service: Karina Foster, Karina Foster 02/21/2023 3:45 PM Medical Record Number: [id number] Patient Account Number: [id number] Date of Birth/Sex: Treating RN: 03/26/1941 (82 y.o. Katrinka Blazing Primary Care Hanley Woerner: Hoyle Sauer Other Clinician: Referring Aryel Edelen: Treating  Trachelle Low/Extender: Jodie Echevaria, Ravisankar R Weeks in Treatment: 8 Wound Status Wound Number: 1 Primary Open Surgical Wound Etiology: Wound Location: Right Axilla Wound Open Wounding Event: Surgical Injury Status: Date Acquired: 10/14/2022 Comorbid Sleep Apnea, Coronary Artery Disease, Myocardial Infarction, Type Weeks Of Treatment: 8 History: II Diabetes, Neuropathy, Received Chemotherapy, Received Clustered Wound: No Radiation Photos Wound Measurements Length: (cm) 1.4 Width: (cm) 1.3 Depth: (cm) 0.1 Area: (cm) 1.429 Volume: (cm) 0.143 % Reduction in Area: 82.8% % Reduction in Volume: 98.9% Epithelialization: Small (1-33%) Tunneling: Yes Position (o'clock): 12 Maximum Distance: (cm) 0.5 Wound Description Classification: Full Thickness With Exposed Support Structures Exudate Amount: Medium Exudate Type: Serosanguineous Exudate Color: red, brown Karina Foster, Karina Foster ([id number]) [id number] Page 6 of 7 Wound Bed Granulation Amount: Large (67-100%) Exposed Structure Granulation Quality: Red, Pink Fat Layer (Subcutaneous Tissue) Exposed: Yes Necrotic Amount: Small (1-33%) Necrotic Quality: Adherent Slough Periwound Skin Texture Texture Color No Abnormalities Noted: No No Abnormalities Noted: No Callus: No Atrophie Blanche: No Crepitus: No Cyanosis: No Excoriation: No Ecchymosis: No Induration: No Erythema: No Rash: No Hemosiderin Staining: No Scarring: No Mottled: No Pallor: No Moisture Rubor: No No Abnormalities Noted: No Dry / Scaly: No Maceration: No Treatment Notes Wound #1 (Axilla) Wound Laterality: Right Cleanser Vashe 5.8 (oz) Discharge Instruction: Cleanse the wound with Vashe prior to applying a clean dressing using gauze sponges, not tissue or cotton balls. Peri-Wound Care Topical Primary Dressing ADAPTIC TOUCH 3x4.25 (in/in) Discharge Instruction: Apply to wound bed over skin sub DuoDERM CGF Border Dressing  Square, 6x6 (in/in) Discharge Instruction: when using the wound vac [place] Discharge Instruction: Skin Sub to Axilla/Breast Secondary Dressing Drawtex 4x4 in Discharge Instruction: Apply over primary dressing/ adaptic Secured With WOUND [facility] Discharge Instruction: to Right Axilla/Breast Compression Wrap Compression Stockings Add-Ons Electronic Signature(s) Signed: 02/22/2023 12:13:42 PM By: Karie Schwalbe RN Entered By: Karie Schwalbe on 02/21/2023 16:17:28 -------------------------------------------------------------------------------- Vitals Details Patient Name: Date of Service: Karina Foster 02/21/2023 3:45 PM Medical Record Number: [id number] Patient Account Number: [id number] Date of Birth/Sex: Treating RN: 23-Dec-1940 (82 y.o. Katrinka Blazing Primary Care Majid Mccravy: Hoyle Sauer Other Clinician: Referring Maretta Overdorf: Treating Meghann [place]/Extender: Vevelyn Royals [place], [place] ([id number]) [id number] Page 7 of 7 Weeks in Treatment: 8 Vital Signs Time Taken: 16:04 Temperature (F): 99 Height (in): 67 Pulse (bpm): 81 Weight (lbs): 150 Respiratory Rate (breaths/min): 18 Body Mass Index (BMI): 23.5 Blood Pressure (mmHg): 120/75 Capillary Blood Glucose (mg/dl): 440 Reference Range: 80 - 120 mg / dl Electronic Signature(s) Signed: 02/22/2023 12:13:42 PM By: Karie Schwalbe RN Entered By: Karie Schwalbe on 02/21/2023 16:04:46

## 2023-02-25 NOTE — Progress Notes (Addendum)
CARLEENE, CLAR ([id number]) [id number] Page 1 of 9 Visit Report for 02/21/2023 Chief Complaint Document Details Patient Name: Date of Service: Karina Foster, Karina Foster 02/21/2023 3:45 PM Medical Record Number: [id number] Patient Account Number: [id number] Date of Birth/Sex: Treating RN: March 08, 1941 (82 y.o. F) Primary Care Provider: Hoyle Sauer Other Clinician: Referring Provider: Treating Provider/Extender: Jodie Echevaria, Ravisankar R Weeks in Treatment: 8 Information Obtained from: Patient Chief Complaint 12/24/2022; Right-sided wound status postmastectomy Electronic Signature(s) Signed: 02/21/2023 4:37:01 PM By: Geralyn Corwin DO Entered By: Geralyn Corwin on 02/21/2023 16:34:49 -------------------------------------------------------------------------------- Cellular or Tissue Based Product Details Patient Name: Date of Service: KRISI, ROWDEN 02/21/2023 3:45 PM Medical Record Number: [id number] Patient Account Number: [id number] Date of Birth/Sex: Treating RN: [birth year] (82 y.o. Gevena Mart Primary Care Provider: Hoyle Sauer Other Clinician: Referring Provider: Treating Provider/Extender: Vevelyn Royals Weeks in Treatment: 8 Cellular or Tissue Based Product Type Wound #1 Right Axilla Applied to: Performed By: Physician Geralyn Corwin, DO Cellular or Tissue Based Product Type: Theraskin Level of Consciousness (Pre-procedure): Awake and Alert Pre-procedure Verification/Time Out Yes - 16:23 Taken: Location: trunk / arms / legs Wound Size (sq cm): 1.82 Product Size (sq cm): 6.5 Waste Size (sq cm): 1.5 Amount of Product Applied (sq cm): 5 Instrument Used: Forceps, Scissors Lot #: [id number] Order #: 2 Expiration Date: 10/19/2027 Fenestrated: No Reconstituted: Yes Solution Type: Normal Saline Solution Amount: 10 ml Lot #: [id number] KS Solution Expiration Date: 09/27/2024 Secured: Yes Secured With:  Steri-Strips Dressing Applied: Yes Primary Dressing: Adaptic,Drawtex Procedural Pain: 0 Post Procedural Pain: 0 Response to Treatment: Procedure was tolerated well Level of Consciousness (Post- Awake and [address] JARONDA, CLUGSTON ([id number]) [id number] Page 2 of 9 procedure): Post Procedure Diagnosis Same as Pre-procedure Notes Scribed for Dr Mikey Bussing by Brenton Grills RN Electronic Signature(s) Signed: 02/21/2023 4:37:01 PM By: Geralyn Corwin DO Signed: 02/25/2023 10:19:19 AM By: Brenton Grills Entered By: Brenton Grills on 02/21/2023 16:35:28 -------------------------------------------------------------------------------- Debridement Details Patient Name: Date of Service: IRIE, SATCHELL 02/21/2023 3:45 PM Medical Record Number: [id number] Patient Account Number: [id number] Date of Birth/Sex: Treating RN: 1941/02/20 (82 y.o. Gevena Mart Primary Care Provider: Hoyle Sauer Other Clinician: Referring Provider: Treating Provider/Extender: Vevelyn Royals Weeks in Treatment: 8 Debridement Performed for Assessment: Wound #1 Right Axilla Performed By: Physician Geralyn Corwin, DO Debridement Type: Debridement Level of Consciousness (Pre-procedure): Awake and Alert Pre-procedure Verification/Time Out Yes - 16:23 Taken: Start Time: 16:23 Percent of Wound Bed Debrided: 100% T Area Debrided (cm): otal 1.43 Tissue and other material debrided: Non-Viable, Slough, Slough Level: Non-Viable Tissue Debridement Description: Selective/Open Wound Instrument: Curette Bleeding: Minimum Hemostasis Achieved: Pressure End Time: 16:24 Procedural Pain: 0 Post Procedural Pain: 0 Response to Treatment: Procedure was tolerated well Level of Consciousness (Post- Awake and Alert procedure): Post Debridement Measurements of Total Wound Length: (cm) 1.4 Width: (cm) 1.3 Depth: (cm) 0.1 Volume: (cm) 0.143 Character of Wound/Ulcer Post  Debridement: Improved Post Procedure Diagnosis Same as Pre-procedure Notes Scribed for Dr Leanord Hawking by Brenton Grills RN. Electronic Signature(s) Signed: 02/21/2023 4:37:01 PM By: Geralyn Corwin DO Signed: 02/25/2023 10:19:19 AM By: Brenton Grills Entered By: Brenton Grills on 02/21/2023 16:32:26 Brave, [name] ([id number]) [id number] Page 3 of 9 -------------------------------------------------------------------------------- HPI Details Patient Name: Date of Service: KAITLAND, LABELLE 02/21/2023 3:45 PM Medical Record Number: [id number] Patient Account Number: [id number] Date of Birth/Sex: Treating RN: [birth year] (82 y.o. F) Primary Care Provider: Hoyle Sauer Other Clinician: Referring Provider: Treating Provider/Extender:  Geralyn Corwin Avva, Ravisankar R Weeks in Treatment: 8 History of Present Illness HPI Description: 12/24/2022 [name] is a 82 year old female with a past medical history of controlled type 2 diabetes, right sided breast cancer status postmastectomy pursuing chemotherapy that presents the clinic for a 61-month history of nonhealing surgical wound dehiscence to the right side status postmastectomy. On 10/14/2022 patient had a right mastectomy by Dr. Donell Beers and subsequently developed cellulitis and wound dehiscence. She has been using saline wet-to-dry dressings. She currently denies signs of infection. She did not tolerate her first round of chemotherapy and this is being placed on hold for now. She did not have radiation or implants. She does state that 27 years ago she had a lumpectomy to the site with radiation. 5/3; patient presents for follow-up. She has been approved for the wound VAC and has this present with her today. She lives [facility] and there are nurses there that can change the wound VAC twice a week. She has been using Vashe wet-to-dry dressings. Wound is stable. 5/14; patient presents for follow-up. She has  been using the wound VAC. She has some irritation to where the drape is placed. We will start Replicare to help with this issue. 5/21; patient presents for follow-up. She has been using the wound VAC. The irritation to the periwound has improved nicely with Replicare/DuoDERM. Wound is smaller. She has no issues or complaints today. 5/28; patient presents for follow-up. We have been using the wound VAC. She has no issues or complaints today. Wound is smaller. She has slough accumulation. 6/4; patient has been using a wound VAC and the wound actually looks quite healthy. The 1 cm superior tunnel is probably about the same. Some adherent slough and fibrinous debris on the surface of the wound. There has been problems with wound VAC supplies specifically she has no black foam 6/11; patient presents for follow-up. She has been approved for TheraSkin and she was agreeable to having this placed today. She has been using the wound VAC without issues. 6/17; patient presents for follow-up. Theraskin was placed in standard fashion at last clinic visit. The wound VAC was continued. She has no issues or complaints today. Wound is smaller. Electronic Signature(s) Signed: 02/21/2023 4:37:01 PM By: Geralyn Corwin DO Entered By: Geralyn Corwin on 02/21/2023 16:35:17 -------------------------------------------------------------------------------- Physical Exam Details Patient Name: Date of Service: KELISHA, TURNQUIST 02/21/2023 3:45 PM Medical Record Number: [id number] Patient Account Number: [id number] Date of Birth/Sex: Treating RN: [birth year] (82 y.o. F) Primary Care Provider: Hoyle Sauer Other Clinician: Referring Provider: Treating Provider/Extender: Jodie Echevaria, Ravisankar R Weeks in Treatment: 8 Constitutional respirations regular, non-labored and within target range for patient.Marland Kitchen Psychiatric pleasant and cooperative. Notes Right side postmastectomy surgical site towards the  right axilla with open wound with granulation tissue and slough. No signs of surrounding infection. Electronic Signature(s) Signed: 02/21/2023 4:37:01 PM By: Jolaine Artist, Signed: 02/21/2023 4:37:01 PM By: Maretta Bees ([id number]) [id number] Page 4 of 9 Entered By: Geralyn Corwin on 02/21/2023 16:35:46 -------------------------------------------------------------------------------- Physician Orders Details Patient Name: Date of Service: LATRISH, GILLIAND 02/21/2023 3:45 PM Medical Record Number: [id number] Patient Account Number: [id number] Date of Birth/Sex: Treating RN: Nov 01, 1940 (82 y.o. Gevena Mart Primary Care Provider: Hoyle Sauer Other Clinician: Referring Provider: Treating Provider/Extender: Vevelyn Royals Weeks in Treatment: 8 Verbal / Phone Orders: No Diagnosis Coding ICD-10 Coding Code Description S21.001A Unspecified open wound of right breast, initial encounter L98.492 Non-pressure  chronic ulcer of skin of other sites with fat layer exposed C50.411 Malignant neoplasm of upper-outer quadrant of right female breast E11.622 Type 2 diabetes mellitus with other skin ulcer Follow-up Appointments ppointment in 1 week. - w/ Dr. Mikey Bussing Room 8 Tuesday June 25th at 3:45pm Return A Other: - Once we apply the wound vac, The nurses at [facility] will begin changing the wound vac Friday's, and Tuesday's you will come to the wound care center and we will change it here. DO NOT REMOVE SKIN SUB OR ADAPTIC OR STERI STRIPS. Wound vac will only be change twice a week, Tuesdays and Fridays Cellular or Tissue Based Products Wound #1 Right Axilla daptic or Mepitel. (DO NOT REMOVE). - Cellular or Tissue Based Product applied to wound bed, secured with steri-strips, cover with A THERASKIN # 2 APPLIED - DO NOT REMOVE SKIN SUB OR ADAPTIC OR STERI STRIPS.(02/20/22) Bathing/ Shower/ Hygiene May shower  with protection but do not get wound dressing(s) wet. Protect dressing(s) with water repellant cover (for example, large plastic bag) or a cast cover and may then take shower. Negative Presssure Wound Therapy Wound Vac to wound continuously at 121mm/hg pressure - Medela wound vac with white and black foam combination. HOLD for week of 02/08/23 only. Black and White Foam combination Wound Treatment Wound #1 - Axilla Wound Laterality: Right Cleanser: Vashe 5.8 (oz) 1 x Per Day/15 Days Discharge Instructions: Cleanse the wound with Vashe prior to applying a clean dressing using gauze sponges, not tissue or cotton balls. Prim Dressing: ADAPTIC TOUCH 3x4.25 (in/in) 1 x Per Day/15 Days ary Discharge Instructions: Apply to wound bed over skin sub Prim Dressing: DuoDERM CGF Border Dressing Square, 6x6 (in/in) (Generic) 1 x Per Day/15 Days ary Discharge Instructions: when using the wound vac Prim Dressing: Theraskin 1 x Per Day/15 Days ary Discharge Instructions: Skin Sub to Axilla/Breast Secondary Dressing: Drawtex 4x4 in 1 x Per Day/15 Days Discharge Instructions: Apply over primary dressing/ adaptic Secured With: WOUND VAC 1 x Per Day/15 Days Discharge Instructions: to Right Axilla/Breast Electronic Signature(s) [place], [place] ([id number]) [id number] Page 5 of 9 Signed: 02/22/2023 3:45:24 PM By: Geralyn Corwin DO Signed: 02/25/2023 10:19:19 AM By: Brenton Grills Previous Signature: 02/21/2023 4:37:01 PM Version By: Geralyn Corwin DO Entered By: Brenton Grills on 02/21/2023 16:50:06 -------------------------------------------------------------------------------- Problem List Details Patient Name: Date of Service: [name] 02/21/2023 3:45 PM Medical Record Number: [id number] Patient Account Number: [id number] Date of Birth/Sex: Treating RN: April 01, 1941 (82 y.o. F) Primary Care Provider: Hoyle Sauer Other Clinician: Referring Provider: Treating  Provider/Extender: Vevelyn Royals Weeks in Treatment: 8 Active Problems ICD-10 Encounter Code Description Active Date MDM Diagnosis S21.001A Unspecified open wound of right breast, initial encounter 12/24/2022 No Yes L98.492 Non-pressure chronic ulcer of skin of other sites with fat layer exposed 02/01/2023 No Yes C50.411 Malignant neoplasm of upper-outer quadrant of right female breast 12/24/2022 No Yes E11.622 Type 2 diabetes mellitus with other skin ulcer 12/24/2022 No Yes Inactive Problems Resolved Problems Electronic Signature(s) Signed: 02/21/2023 4:37:01 PM By: Geralyn Corwin DO Entered By: Geralyn Corwin on 02/21/2023 16:34:38 -------------------------------------------------------------------------------- Progress Note Details Patient Name: Date of Service: [name] 02/21/2023 3:45 PM Medical Record Number: [id number] Patient Account Number: [id number] Date of Birth/Sex: Treating RN: Dec 23, 1940 (82 y.o. F) Primary Care Provider: Hoyle Sauer Other Clinician: Referring Provider: Treating Provider/Extender: Vevelyn Royals Weeks in Treatment: 8 Subjective Chief Complaint Information obtained from Patient 12/24/2022; Right-sided wound status postmastectomy Vallecillo, Shaton G (  [id number]) [id number] Page 6 of 9 History of Present Illness (HPI) 12/24/2022 [name] is a 82 year old female with a past medical history of controlled type 2 diabetes, right sided breast cancer status postmastectomy pursuing chemotherapy that presents the clinic for a 39-month history of nonhealing surgical wound dehiscence to the right side status postmastectomy. On 10/14/2022 patient had a right mastectomy by Dr. Donell Beers and subsequently developed cellulitis and wound dehiscence. She has been using saline wet-to-dry dressings. She currently denies signs of infection. She did not tolerate her first round of  chemotherapy and this is being placed on hold for now. She did not have radiation or implants. She does state that 27 years ago she had a lumpectomy to the site with radiation. 5/3; patient presents for follow-up. She has been approved for the wound VAC and has this present with her today. She lives at [facility] and there are nurses there that can change the wound VAC twice a week. She has been using Vashe wet-to-dry dressings. Wound is stable. 5/14; patient presents for follow-up. She has been using the wound VAC. She has some irritation to where the drape is placed. We will start Replicare to help with this issue. 5/21; patient presents for follow-up. She has been using the wound VAC. The irritation to the periwound has improved nicely with Replicare/DuoDERM. Wound is smaller. She has no issues or complaints today. 5/28; patient presents for follow-up. We have been using the wound VAC. She has no issues or complaints today. Wound is smaller. She has slough accumulation. 6/4; patient has been using a wound VAC and the wound actually looks quite healthy. The 1 cm superior tunnel is probably about the same. Some adherent slough and fibrinous debris on the surface of the wound. There has been problems with wound VAC supplies specifically she has no black foam 6/11; patient presents for follow-up. She has been approved for TheraSkin and she was agreeable to having this placed today. She has been using the wound VAC without issues. 6/17; patient presents for follow-up. Theraskin was placed in standard fashion at last clinic visit. The wound VAC was continued. She has no issues or complaints today. Wound is smaller. Patient History Information obtained from Patient, Chart. Family History Unknown History. Social History Former smoker, Marital Status - Married, Alcohol Use - Never, Drug Use - No History, Caffeine Use - Rarely. Medical History Respiratory Patient has history of Sleep  Apnea Cardiovascular Patient has history of Coronary Artery Disease, Myocardial Infarction Endocrine Patient has history of Type II Diabetes Neurologic Patient has history of Neuropathy - HANDS and feet Oncologic Patient has history of Received Chemotherapy - right breast, Received Radiation - right breat ca Hospitalization/Surgery History - right total mastectomy. - portacath placement. - breast biopsy right. - cataract extraction. - coronary angioplasty with stent placement. Objective Constitutional respirations regular, non-labored and within target range for patient.. Vitals Time Taken: 4:04 PM, Height: 67 in, Weight: 150 lbs, BMI: 23.5, Temperature: 99 F, Pulse: 81 bpm, Respiratory Rate: 18 breaths/min, Blood Pressure: 120/75 mmHg, Capillary Blood Glucose: 149 mg/dl. Psychiatric pleasant and cooperative. General Notes: Right side postmastectomy surgical site towards the right axilla with open wound with granulation tissue and slough. No signs of surrounding infection. Integumentary (Hair, Skin) Wound #1 status is Open. Original cause of wound was Surgical Injury. The date acquired was: 10/14/2022. The wound has been in treatment 8 weeks. The wound is located on the Right Axilla. The wound measures 1.4cm length x 1.3cm width x 0.1cm  depth; 1.429cm^2 area and 0.143cm^3 volume. There is Fat Layer (Subcutaneous Tissue) exposed. There is tunneling at 12:00 with a maximum distance of 0.5cm. There is a medium amount of serosanguineous drainage noted. There is large (67-100%) red, pink granulation within the wound bed. There is a small (1-33%) amount of necrotic tissue within the wound bed including Adherent Slough. The periwound skin appearance did not exhibit: Callus, Crepitus, Excoriation, Induration, Rash, Scarring, Dry/Scaly, Maceration, Atrophie Blanche, Cyanosis, Ecchymosis, Hemosiderin Staining, Mottled, Pallor, Rubor, Erythema. MARY-ANN, WOZNY ([id number])  [id number] Page 7 of 9 Assessment Active Problems ICD-10 Unspecified open wound of right breast, initial encounter Non-pressure chronic ulcer of skin of other sites with fat layer exposed Malignant neoplasm of upper-outer quadrant of right female breast Type 2 diabetes mellitus with other skin ulcer Patient's wound has shown improvement in size in appearance since last clinic visit. I debrided nonviable tissue. TheraSkin #2 was placed in standard fashion. Wound VAC was continued. Follow-up in 1 week. Procedures Wound #1 Pre-procedure diagnosis of Wound #1 is an Open Surgical Wound located on the Right Axilla . There was a Selective/Open Wound Non-Viable Tissue Debridement with a total area of 1.43 sq cm performed by Geralyn Corwin, DO. With the following instrument(s): Curette to remove Non-Viable tissue/material. Material removed includes Slough. No specimens were taken. A time out was conducted at 16:23, prior to the start of the procedure. A Minimum amount of bleeding was controlled with Pressure. The procedure was tolerated well with a pain level of 0 throughout and a pain level of 0 following the procedure. Post Debridement Measurements: 1.4cm length x 1.3cm width x 0.1cm depth; 0.143cm^3 volume. Character of Wound/Ulcer Post Debridement is improved. Post procedure Diagnosis Wound #1: Same as Pre-Procedure General Notes: Scribed for Dr Leanord Hawking by Brenton Grills RN.. Pre-procedure diagnosis of Wound #1 is an Open Surgical Wound located on the Right Axilla. A skin graft procedure using a bioengineered skin substitute/cellular or tissue based product was performed by Geralyn Corwin, DO with the following instrument(s): Forceps and Scissors. Theraskin was applied and secured with Steri-Strips. 5 sq cm of product was utilized and 1.5 sq cm was wasted. Post Application, Adaptic,Drawtex was applied. A Time Out was conducted at 16:23, prior to the start of the  procedure. The procedure was tolerated well with a pain level of 0 throughout and a pain level of 0 following the procedure. Post procedure Diagnosis Wound #1: Same as Pre-Procedure General Notes: Scribed for Dr Mikey Bussing by Brenton Grills RN. Plan Follow-up Appointments: Return Appointment in 1 week. - w/ Dr. Mikey Bussing Room 8 Tuesday June 25th at 3:45pm Other: - Once we apply the wound vac, The nurses at [facility] will begin changing the wound vac Friday's, and Tuesday's you will come to the wound care center and we will change it here. DO NOT REMOVE SKIN SUB OR ADAPTIC OR STERI STRIPS. Wound vac will only be change twice a week, Tuesdays and Fridays Cellular or Tissue Based Products: Wound #1 Right Axilla: Cellular or Tissue Based Product applied to wound bed, secured with steri-strips, cover with Adaptic or Mepitel. (DO NOT REMOVE). - THERASKIN # 2 APPLIED - DO NOT REMOVE SKIN SUB OR ADAPTIC OR STERI STRIPS.(02/20/22) Bathing/ Shower/ Hygiene: May shower with protection but do not get wound dressing(s) wet. Protect dressing(s) with water repellant cover (for example, large plastic bag) or a cast cover and may then take shower. Negative Presssure Wound Therapy: Wound Vac to wound continuously at 161mm/hg pressure - Medela wound vac with white  and black foam combination. HOLD for week of 02/08/23 only. Black and White Foam combination WOUND #1: - Axilla Wound Laterality: Right Cleanser: Vashe 5.8 (oz) 1 x Per Day/15 Days Discharge Instructions: Cleanse the wound with Vashe prior to applying a clean dressing using gauze sponges, not tissue or cotton balls. Prim Dressing: ADAPTIC TOUCH 3x4.25 (in/in) 1 x Per Day/15 Days ary Discharge Instructions: Apply to wound bed over skin sub Prim Dressing: DuoDERM CGF Border Dressing Square, 6x6 (in/in) (Generic) 1 x Per Day/15 Days ary Discharge Instructions: when using the wound vac Prim Dressing: Theraskin 1 x Per Day/15 Days ary Discharge  Instructions: Skin Sub to Axilla/Breast Secondary Dressing: Drawtex 4x4 in 1 x Per Day/15 Days Discharge Instructions: Apply over primary dressing/ adaptic Secured With: WOUND VAC 1 x Per Day/15 Days Discharge Instructions: to Right Axilla/Breast 1. In office sharp debridement 2. TheraSkin placed in standard fashion 3. Follow-up in 1 week 4. Continue wound VAC Electronic Signature(s) Signed: 02/23/2023 3:27:24 PM By: Shawn Stall RN, BSN Signed: 02/24/2023 4:07:22 PM By: Geralyn Corwin DO Previous Signature: 02/21/2023 4:37:01 PM Version By: Debara Pickett ([id number]) [id number] Page 8 of 9 Entered By: Shawn Stall on 02/23/2023 15:23:31 -------------------------------------------------------------------------------- HxROS Details Patient Name: Date of Service: TERETHA, ILG 02/21/2023 3:45 PM Medical Record Number: [id number] Patient Account Number: [id number] Date of Birth/Sex: Treating RN: 1940-10-09 (82 y.o. F) Primary Care Provider: Hoyle Sauer Other Clinician: Referring Provider: Treating Provider/Extender: Jodie Echevaria, Ravisankar R Weeks in Treatment: 8 Information Obtained From Patient Chart Respiratory Medical History: Positive for: Sleep Apnea Cardiovascular Medical History: Positive for: Coronary Artery Disease; Myocardial Infarction Endocrine Medical History: Positive for: Type II Diabetes Treated with: Insulin Neurologic Medical History: Positive for: Neuropathy - HANDS and feet Oncologic Medical History: Positive for: Received Chemotherapy - right breast; Received Radiation - right breat ca Immunizations Pneumococcal Vaccine: Received Pneumococcal Vaccination: Yes Received Pneumococcal Vaccination On or After 60th Birthday: Yes Implantable Devices Yes Hospitalization / Surgery History Type of Hospitalization/Surgery right total mastectomy portacath placement breast biopsy  right cataract extraction coronary angioplasty with stent placement Family and Social History Unknown History: Yes; Former smoker; Marital Status - Married; Alcohol Use: Never; Drug Use: No History; Caffeine Use: Rarely; Financial Concerns: No; Food, Clothing or Shelter Needs: No; Support System Lacking: No; Transportation Concerns: No Electronic Signature(s) Signed: 02/21/2023 4:37:01 PM By: Geralyn Corwin DO Entered By: Geralyn Corwin on 02/21/2023 16:35:22 Arment, [name] ([id number]) [id number] Page 9 of 9 -------------------------------------------------------------------------------- SuperBill Details Patient Name: Date of Service: NIALA, CABBAGESTALK 02/21/2023 Medical Record Number: [id number] Patient Account Number: [id number] Date of Birth/Sex: Treating RN: [birth year] (82 y.o. F) Primary Care Provider: Hoyle Sauer Other Clinician: Referring Provider: Treating Provider/Extender: Jodie Echevaria, Ravisankar R Weeks in Treatment: 8 Diagnosis Coding ICD-10 Codes Code Description S21.001A Unspecified open wound of right breast, initial encounter L98.492 Non-pressure chronic ulcer of skin of other sites with fat layer exposed C50.411 Malignant neoplasm of upper-outer quadrant of right female breast E11.622 Type 2 diabetes mellitus with other skin ulcer Facility Procedures : CPT4 Code: [id number] Description: 15271 - SKIN SUB GRAFT TRNK/ARM/LEG ICD-10 Diagnosis Description L98.492 Non-pressure chronic ulcer of skin of other sites with fat layer exposed S21.001A Unspecified open wound of right breast, initial encounter E11.622 Type 2 diabetes  mellitus with other skin ulcer Modifier: Quantity: 1 : CPT4 Code: 42706237 Description: S2831- [place] per 1sq cm extra small -6 sq cm Modifier: Quantity: 6 Physician Procedures : CPT4 Code Description  Modifier [id number] 15271 - WC PHYS SKIN SUB GRAFT TRNK/ARM/LEG ICD-10 Diagnosis Description  L98.492 Non-pressure chronic ulcer of skin of other sites with fat layer exposed S21.001A Unspecified open wound of right breast, initial  encounter E11.622 Type 2 diabetes mellitus with other skin ulcer Quantity: 1 Electronic Signature(s) Signed: 03/07/2023 11:18:25 AM By: Pearletha Alfred Signed: 03/07/2023 1:38:23 PM By: Geralyn Corwin DO Previous Signature: 02/21/2023 4:37:01 PM Version By: Geralyn Corwin DO Entered By: Pearletha Alfred on 03/07/2023 11:18:25

## 2023-03-01 ENCOUNTER — Encounter (HOSPITAL_BASED_OUTPATIENT_CLINIC_OR_DEPARTMENT_OTHER): Payer: Medicare Other | Admitting: Internal Medicine

## 2023-03-01 DIAGNOSIS — L98492 Non-pressure chronic ulcer of skin of other sites with fat layer exposed: Secondary | ICD-10-CM

## 2023-03-01 DIAGNOSIS — E11622 Type 2 diabetes mellitus with other skin ulcer: Secondary | ICD-10-CM | POA: Diagnosis not present

## 2023-03-01 DIAGNOSIS — C50411 Malignant neoplasm of upper-outer quadrant of right female breast: Secondary | ICD-10-CM

## 2023-03-01 DIAGNOSIS — E1140 Type 2 diabetes mellitus with diabetic neuropathy, unspecified: Secondary | ICD-10-CM | POA: Diagnosis not present

## 2023-03-01 DIAGNOSIS — S21001A Unspecified open wound of right breast, initial encounter: Secondary | ICD-10-CM

## 2023-03-01 DIAGNOSIS — I2510 Atherosclerotic heart disease of native coronary artery without angina pectoris: Secondary | ICD-10-CM | POA: Diagnosis not present

## 2023-03-02 NOTE — Progress Notes (Addendum)
[address], [place] ([id number]) [id number] Page 1 of 9 Visit Report for 03/01/2023 Chief Complaint Document Details Patient Name: Date of Service: Karina Foster, Karina Foster 03/01/2023 3:45 PM Medical Record Number: [id number] Patient Account Number: [id number] Date of Birth/Sex: Treating RN: [birth year] (82 y.o. F) Primary Care Provider: Hoyle Sauer Other Clinician: Referring Provider: Treating Provider/Extender: Jodie Echevaria, Ravisankar R Weeks in Treatment: 9 Information Obtained from: Patient Chief Complaint 12/24/2022; Right-sided wound status postmastectomy Electronic Signature(s) Signed: 03/01/2023 4:25:00 PM By: Geralyn Corwin DO Entered By: Geralyn Corwin on 03/01/2023 16:20:19 -------------------------------------------------------------------------------- Cellular or Tissue Based Product Details Patient Name: Date of Service: Karina Foster, Karina Foster 03/01/2023 3:45 PM Medical Record Number: [id number] Patient Account Number: [id number] Date of Birth/Sex: Treating RN: [birth year] (82 y.o. F) Primary Care Provider: Hoyle Sauer Other Clinician: Referring Provider: Treating Provider/Extender: Vevelyn Royals Weeks in Treatment: 9 Cellular or Tissue Based Product Type Wound #1 Right Axilla Applied to: Performed By: Physician Geralyn Corwin, DO Cellular or Tissue Based Product Type: Theraskin Level of Consciousness (Pre-procedure): Awake and Alert Pre-procedure Verification/Time Out Yes - 16:04 Taken: Location: trunk / arms / legs Wound Size (sq cm): 0.75 Product Size (sq cm): 6 Waste Size (sq cm): 3.5 Waste Reason: wound size Amount of Product Applied (sq cm): 2.5 Instrument Used: Forceps, Scissors Lot #: [id number] Expiration Date: 04/13/2027 Fenestrated: No Reconstituted: Yes Solution Type: normal saline Solution Amount: 4 ml Lot #: [id number] Solution Expiration Date: 02/03/2025 Secured: Yes Secured With:  Steri-Strips Dressing Applied: Yes Primary Dressing: adaptic Procedural Pain: 0 Post Procedural Pain: 0 Response to Treatment: Procedure was tolerated well Level of Consciousness (Post- Awake and [address] P[place]ER, WELLE ([id number]) [id number] Page 2 of 9 procedure): Post Procedure Diagnosis Same as Pre-procedure Notes scribed for Dr. Mikey Bussing by Samuella Bruin, RN Electronic Signature(s) Signed: 03/07/2023 12:24:14 PM By: Pearletha Alfred Signed: 03/07/2023 1:38:23 PM By: Geralyn Corwin DO Previous Signature: 03/01/2023 4:25:00 PM Version By: Geralyn Corwin DO Previous Signature: 03/02/2023 3:47:51 PM Version By: Samuella Bruin Entered By: Pearletha Alfred on 03/07/2023 12:24:14 -------------------------------------------------------------------------------- HPI Details Patient Name: Date of Service: Karina Foster, Karina Foster 03/01/2023 3:45 PM Medical Record Number: [id number] Patient Account Number: [id number] Date of Birth/Sex: Treating RN: 29-Dec-1940 (82 y.o. F) Primary Care Provider: Hoyle Sauer Other Clinician: Referring Provider: Treating Provider/Extender: Jodie Echevaria, Ravisankar R Weeks in Treatment: 9 History of Present Illness HPI Description: 12/24/2022 [name] is a 82 year old female with a past medical history of controlled type 2 diabetes, right sided breast cancer status postmastectomy pursuing chemotherapy that presents the clinic for a 71-month history of nonhealing surgical wound dehiscence to the right side status postmastectomy. On 10/14/2022 patient had a right mastectomy by Dr. Donell Beers and subsequently developed cellulitis and wound dehiscence. She has been using saline wet-to-dry dressings. She currently denies signs of infection. She did not tolerate her first round of chemotherapy and this is being placed on hold for now. She did not have radiation or implants. She does state that 27 years ago she had a lumpectomy to  the site with radiation. 5/3; patient presents for follow-up. She has been approved for the wound VAC and has this present with her today. She lives at [facility] and there are nurses there that can change the wound VAC twice a week. She has been using Vashe wet-to-dry dressings. Wound is stable. 5/14; patient presents for follow-up. She has been using the wound VAC. She has some irritation to where the drape is placed. We  will start Replicare to help with this issue. 5/21; patient presents for follow-up. She has been using the wound VAC. The irritation to the periwound has improved nicely with Replicare/DuoDERM. Wound is smaller. She has no issues or complaints today. 5/28; patient presents for follow-up. We have been using the wound VAC. She has no issues or complaints today. Wound is smaller. She has slough accumulation. 6/4; patient has been using a wound VAC and the wound actually looks quite healthy. The 1 cm superior tunnel is probably about the same. Some adherent slough and fibrinous debris on the surface of the wound. There has been problems with wound VAC supplies specifically she has no black foam 6/11; patient presents for follow-up. She has been approved for TheraSkin and she was agreeable to having this placed today. She has been using the wound VAC without issues. 6/17; patient presents for follow-up. Theraskin was placed in standard fashion at last clinic visit. The wound VAC was continued. She has no issues or complaints today. Wound is smaller. 6/25; patient presents for follow-up. We have been using TheraSkin to the wound bed. Wound is smaller. We are also using a wound VAC. She has no issues or complaints today. Electronic Signature(s) Signed: 03/01/2023 4:25:00 PM By: Geralyn Corwin DO Entered By: Geralyn Corwin on 03/01/2023 16:20:52 Physical Exam Details -------------------------------------------------------------------------------- Karina Foster ([id number])  [id number] Page 3 of 9 Patient Name: Date of Service: Karina Foster, Karina Foster 03/01/2023 3:45 PM Medical Record Number: [id number] Patient Account Number: [id number] Date of Birth/Sex: Treating RN: [birth year] (82 y.o. F) Primary Care Provider: Hoyle Sauer Other Clinician: Referring Provider: Treating Provider/Extender: Jodie Echevaria, Ravisankar R Weeks in Treatment: 9 Constitutional respirations regular, non-labored and within target range for patient.. Cardiovascular 2+ dorsalis pedis/posterior tibialis pulses. Psychiatric pleasant and cooperative. Notes Right side postmastectomy surgical site towards the right axilla with open wound with granulation tissue. No signs of surrounding infection. Electronic Signature(s) Signed: 03/01/2023 4:25:00 PM By: Geralyn Corwin DO Entered By: Geralyn Corwin on 03/01/2023 16:23:14 -------------------------------------------------------------------------------- Physician Orders Details Patient Name: Date of Service: Karina Foster, Karina Foster 03/01/2023 3:45 PM Medical Record Number: [id number] Patient Account Number: [id number] Date of Birth/Sex: Treating RN: 12-13-1940 (82 y.o. [name] Primary Care Provider: Hoyle Sauer Other Clinician: Referring Provider: Treating Provider/Extender: Vevelyn Royals Weeks in Treatment: 9 Verbal / Phone Orders: No Diagnosis Coding Follow-up Appointments ppointment in 1 week. - w/ Dr. Mikey Bussing Room 8 Return A Other: - Once we apply the wound vac, The nurses at [facility] will begin changing the wound vac Friday's, and Tuesday's you will come to the wound care center and we will change it here. DO NOT REMOVE SKIN SUB OR ADAPTIC OR STERI STRIPS. Wound vac will only be change twice a week, Tuesdays and Fridays Cellular or Tissue Based Products Wound #1 Right Axilla daptic or Mepitel. (DO NOT REMOVE). - Cellular or Tissue Based Product  applied to wound bed, secured with steri-strips, cover with A THERASKIN # 3 APPLIED - DO NOT REMOVE SKIN SUB OR ADAPTIC OR STERI STRIPS.(02/28/22) Bathing/ Shower/ Hygiene May shower with protection but do not get wound dressing(s) wet. Protect dressing(s) with water repellant cover (for example, large plastic bag) or a cast cover and may then take shower. Negative Presssure Wound Therapy Wound Vac to wound continuously at 126mm/hg pressure - Medela wound vac with white and black foam combination. HOLD for week of 02/08/23 only. Black and White Foam combination Wound Treatment Wound #1 - Axilla  Wound Laterality: Right Cleanser: Vashe 5.8 (oz) 1 x Per Day/15 Days Discharge Instructions: Cleanse the wound with Vashe prior to applying a clean dressing using gauze sponges, not tissue or cotton balls. Prim Dressing: ADAPTIC TOUCH 3x4.25 (in/in) 1 x Per Day/15 Days ary Discharge Instructions: Apply to wound bed over skin sub Prim Dressing: DuoDERM CGF Border Dressing Square, 6x6 (in/in) (Generic) 1 x Per Day/15 Days ary Discharge Instructions: when using the wound vac MARDELL, SCHULDT ([id number]) [id number] Page 4 of 9 Prim Dressing: Theraskin 1 x Per Day/15 Days ary Discharge Instructions: Skin Sub to Axilla/Breast Secondary Dressing: Drawtex 4x4 in 1 x Per Day/15 Days Discharge Instructions: Apply over primary dressing/ adaptic Secured With: WOUND VAC 1 x Per Day/15 Days Discharge Instructions: to Right Axilla/Breast Patient Medications llergies: azithromycin, Demerol A Notifications Medication Indication Start End 03/01/2023 lidocaine DOSE topical 4 % cream - cream topical Electronic Signature(s) Signed: 03/01/2023 4:25:00 PM By: Geralyn Corwin DO Entered By: Geralyn Corwin on 03/01/2023 16:23:21 -------------------------------------------------------------------------------- Problem List Details Patient Name: Date of Service: Karina Foster  03/01/2023 3:45 PM Medical Record Number: [id number] Patient Account Number: [id number] Date of Birth/Sex: Treating RN: [birth year] (82 y.o. F) Primary Care Provider: Hoyle Sauer Other Clinician: Referring Provider: Treating Provider/Extender: Jodie Echevaria, Ravisankar R Weeks in Treatment: 9 Active Problems ICD-10 Encounter Code Description Active Date MDM Diagnosis S21.001A Unspecified open wound of right breast, initial encounter 12/24/2022 No Yes L98.492 Non-pressure chronic ulcer of skin of other sites with fat layer exposed 02/01/2023 No Yes C50.411 Malignant neoplasm of upper-outer quadrant of right female breast 12/24/2022 No Yes E11.622 Type 2 diabetes mellitus with other skin ulcer 12/24/2022 No Yes Inactive Problems Resolved Problems Electronic Signature(s) Signed: 03/01/2023 4:25:00 PM By: Geralyn Corwin DO Entered By: Geralyn Corwin on 03/01/2023 16:20:05 Karina Foster ([id number]) [id number] Page 5 of 9 -------------------------------------------------------------------------------- Progress Note Details Patient Name: Date of Service: TACORI, WILLHOITE 03/01/2023 3:45 PM Medical Record Number: [id number] Patient Account Number: [id number] Date of Birth/Sex: Treating RN: 1941/07/23 (82 y.o. F) Primary Care Provider: Hoyle Sauer Other Clinician: Referring Provider: Treating Provider/Extender: Jodie Echevaria, Ravisankar R Weeks in Treatment: 9 Subjective Chief Complaint Information obtained from Patient 12/24/2022; Right-sided wound status postmastectomy History of Present Illness (HPI) 12/24/2022 [name] is a 82 year old female with a past medical history of controlled type 2 diabetes, right sided breast cancer status postmastectomy pursuing chemotherapy that presents the clinic for a 32-month history of nonhealing surgical wound dehiscence to the right side status postmastectomy. On 10/14/2022 patient  had a right mastectomy by Dr. Donell Beers and subsequently developed cellulitis and wound dehiscence. She has been using saline wet-to-dry dressings. She currently denies signs of infection. She did not tolerate her first round of chemotherapy and this is being placed on hold for now. She did not have radiation or implants. She does state that 27 years ago she had a lumpectomy to the site with radiation. 5/3; patient presents for follow-up. She has been approved for the wound VAC and has this present with her today. She lives at [facility] and there are nurses there that can change the wound VAC twice a week. She has been using Vashe wet-to-dry dressings. Wound is stable. 5/14; patient presents for follow-up. She has been using the wound VAC. She has some irritation to where the drape is placed. We will start Replicare to help with this issue. 5/21; patient presents for follow-up. She has been using the wound VAC. The irritation to  the periwound has improved nicely with Replicare/DuoDERM. Wound is smaller. She has no issues or complaints today. 5/28; patient presents for follow-up. We have been using the wound VAC. She has no issues or complaints today. Wound is smaller. She has slough accumulation. 6/4; patient has been using a wound VAC and the wound actually looks quite healthy. The 1 cm superior tunnel is probably about the same. Some adherent slough and fibrinous debris on the surface of the wound. There has been problems with wound VAC supplies specifically she has no black foam 6/11; patient presents for follow-up. She has been approved for TheraSkin and she was agreeable to having this placed today. She has been using the wound VAC without issues. 6/17; patient presents for follow-up. Theraskin was placed in standard fashion at last clinic visit. The wound VAC was continued. She has no issues or complaints today. Wound is smaller. 6/25; patient presents for follow-up. We have been using TheraSkin  to the wound bed. Wound is smaller. We are also using a wound VAC. She has no issues or complaints today. Patient History Information obtained from Patient, Chart. Family History Unknown History. Social History Former smoker, Marital Status - Married, Alcohol Use - Never, Drug Use - No History, Caffeine Use - Rarely. Medical History Respiratory Patient has history of Sleep Apnea Cardiovascular Patient has history of Coronary Artery Disease, Myocardial Infarction Endocrine Patient has history of Type II Diabetes Neurologic Patient has history of Neuropathy - HANDS and feet Oncologic Patient has history of Received Chemotherapy - right breast, Received Radiation - right breat ca Hospitalization/Surgery History - right total mastectomy. - portacath placement. - breast biopsy right. - cataract extraction. - coronary angioplasty with stent placement. [address] [place] ([id number]) [id number] Page 6 of 9 Objective Constitutional respirations regular, non-labored and within target range for patient.. Vitals Time Taken: 3:50 PM, Height: 67 in, Weight: 150 lbs, BMI: 23.5, Temperature: 97.6 F, Pulse: 96 bpm, Respiratory Rate: 18 breaths/min, Blood Pressure: 152/71 mmHg, Capillary Blood Glucose: 146 mg/dl. Cardiovascular 2+ dorsalis pedis/posterior tibialis pulses. Psychiatric pleasant and cooperative. General Notes: Right side postmastectomy surgical site towards the right axilla with open wound with granulation tissue. No signs of surrounding infection. Integumentary (Hair, Skin) Wound #1 status is Open. Original cause of wound was Surgical Injury. The date acquired was: 10/14/2022. The wound has been in treatment 9 weeks. The wound is located on the Right Axilla. The wound measures 1.5cm length x 0.5cm width x 0.1cm depth; 0.589cm^2 area and 0.059cm^3 volume. There is Fat Layer (Subcutaneous Tissue) exposed. There is no undermining noted, however, there is tunneling  at 12:00 with a maximum distance of 0.4cm. There is a medium amount of serosanguineous drainage noted. The wound margin is distinct with the outline attached to the wound base. There is large (67-100%) red, pink granulation within the wound bed. There is a small (1-33%) amount of necrotic tissue within the wound bed including Adherent Slough. The periwound skin appearance did not exhibit: Callus, Crepitus, Excoriation, Induration, Rash, Scarring, Dry/Scaly, Maceration, Atrophie Blanche, Cyanosis, Ecchymosis, Hemosiderin Staining, Mottled, Pallor, Rubor, Erythema. 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 TheraSkin was placed in standard fashion. Continue the wound VAC. Follow-up in 1 week. Procedures Wound #1 Pre-procedure diagnosis  of Wound #1 is an Open Surgical Wound located on the Right Axilla. A skin graft procedure using a bioengineered skin substitute/cellular or tissue based product was performed by Geralyn Corwin, DO with the following instrument(s): Forceps and Scissors. Theraskin was applied and secured with Steri-Strips. 3.5 sq cm of product was utilized and 3.5 sq cm was wasted due to wound size. Post Application, adaptic was applied. A Time Out was conducted at 16:04, prior to the start of the procedure. The procedure was tolerated well with a pain level of 0 throughout and a pain level of 0 following the procedure. Post procedure Diagnosis Wound #1: Same as Pre-Procedure General Notes: scribed for Dr. Mikey Bussing by Samuella Bruin, RN. Plan Follow-up Appointments: Return Appointment in 1 week. - w/ Dr. Mikey Bussing Room 8 Other: - Once we apply the wound vac, The  nurses at [facility] will begin changing the wound vac Friday's, and Tuesday's you will come to the wound care center and we will change it here. DO NOT REMOVE SKIN SUB OR ADAPTIC OR STERI STRIPS. Wound vac will only be change twice a week, Tuesdays and Fridays Cellular or Tissue Based Products: Wound #1 Right Axilla: Cellular or Tissue Based Product applied to wound bed, secured with steri-strips, cover with Adaptic or Mepitel. (DO NOT REMOVE). Marcellus Scott # 3 APPLIED - DO NOT REMOVE SKIN SUB OR ADAPTIC OR STERI STRIPS.(02/28/22) Bathing/ Shower/ Hygiene: May shower with protection but do not get wound dressing(s) wet. Protect dressing(s) with water repellant cover (for example, large plastic bag) or a cast cover and may then take shower. Negative Presssure Wound Therapy: Wound Vac to wound continuously at 170mm/hg pressure - Medela wound vac with white and black foam combination. HOLD for week of 02/08/23 only. Black and White Foam combination The following medication(s) was prescribed: lidocaine topical 4 % cream cream topical was prescribed at facility WOUND #1: - Axilla Wound Laterality: Right Cleanser: Vashe 5.8 (oz) 1 x Per Day/15 Days Discharge Instructions: Cleanse the wound with Vashe prior to applying a clean dressing using gauze sponges, not tissue or cotton balls. [address], [place] ([id number]) [id number] Page 7 of 9 Prim Dressing: ADAPTIC TOUCH 3x4.25 (in/in) 1 x Per Day/15 Days ary Discharge Instructions: Apply to wound bed over skin sub Prim Dressing: DuoDERM CGF Border Dressing Square, 6x6 (in/in) (Generic) 1 x Per Day/15 Days ary Discharge Instructions: when using the wound vac Prim Dressing: Theraskin 1 x Per Day/15 Days ary Discharge Instructions: Skin Sub to Axilla/Breast Secondary Dressing: Drawtex 4x4 in 1 x Per Day/15 Days Discharge Instructions: Apply over primary dressing/ adaptic Secured With: WOUND VAC 1 x Per Day/15 Days Discharge  Instructions: to Right Axilla/Breast 1. TheraSkinplaced in standard fashion 2. Continue wound VAC 3. Follow-up in 1 week Electronic Signature(s) Signed: 03/01/2023 4:25:00 PM By: Geralyn Corwin DO Entered By: Geralyn Corwin on 03/01/2023 16:24:08 -------------------------------------------------------------------------------- HxROS Details Patient Name: Date of Service: AMARIYANA, Karina Foster 03/01/2023 3:45 PM Medical Record Number: [id number] Patient Account Number: [id number] Date of Birth/Sex: Treating RN: 29-Jul-1941 (82 y.o. F) Primary Care Provider: Hoyle Sauer Other Clinician: Referring Provider: Treating Provider/Extender: Jodie Echevaria, Ravisankar R Weeks in Treatment: 9 Information Obtained From Patient Chart Respiratory Medical History: Positive for: Sleep Apnea Cardiovascular Medical History: Positive for: Coronary Artery Disease; Myocardial Infarction Endocrine Medical History: Positive for: Type II Diabetes Treated with: Insulin Neurologic Medical History: Positive for: Neuropathy - HANDS and feet Oncologic Medical History: Positive for: Received Chemotherapy - right breast; Received Radiation - right breat  ca Immunizations Pneumococcal Vaccine: Received Pneumococcal Vaccination: Yes Received Pneumococcal Vaccination On or After 60th Birthday: Yes Implantable [address] [place] ([id number]) [id number] Page 8 of 9 Hospitalization / Surgery History Type of Hospitalization/Surgery right total mastectomy portacath placement breast biopsy right cataract extraction coronary angioplasty with stent placement Family and Social History Unknown History: Yes; Former smoker; Marital Status - Married; Alcohol Use: Never; Drug Use: No History; Caffeine Use: Rarely; Financial Concerns: No; Food, Clothing or Shelter Needs: No; Support System Lacking: No; Transportation Concerns: No Electronic Signature(s) Signed:  03/01/2023 4:25:00 PM By: Geralyn Corwin DO Entered By: Geralyn Corwin on 03/01/2023 16:20:58 -------------------------------------------------------------------------------- SuperBill Details Patient Name: Date of Service: Karina Foster 03/01/2023 Medical Record Number: [id number] Patient Account Number: [id number] Date of Birth/Sex: Treating RN: [birth year] (82 y.o. F) Primary Care Provider: Hoyle Sauer Other Clinician: Referring Provider: Treating Provider/Extender: Jodie Echevaria, Ravisankar R Weeks in Treatment: 9 Diagnosis Coding ICD-10 Codes Code Description S21.001A Unspecified open wound of right breast, initial encounter L98.492 Non-pressure chronic ulcer of skin of other sites with fat layer exposed C50.411 Malignant neoplasm of upper-outer quadrant of right female breast E11.622 Type 2 diabetes mellitus with other skin ulcer Facility Procedures : CPT4 Code: 78295621 Description: 15271 - SKIN SUB GRAFT TRNK/ARM/LEG ICD-10 Diagnosis Description S21.001A Unspecified open wound of right breast, initial encounter E11.622 Type 2 diabetes mellitus with other skin ulcer L98.492 Non-pressure chronic ulcer of skin of other  sites with fat layer exposed C50.411 Malignant neoplasm of upper-outer quadrant of right female breast Modifier: Quantity: 1 : CPT4 Code: 30865784 Description: 69629 - WOUND VAC-50 SQ CM OR LESS ICD-10 Diagnosis Description S21.001A Unspecified open wound of right breast, initial encounter L98.492 Non-pressure chronic ulcer of skin of other sites with fat layer exposed C50.411 Malignant neoplasm of  upper-outer quadrant of right female breast E11.622 Type 2 diabetes mellitus with other skin ulcer Modifier: Quantity: 1 : CPT4 Code: 52841324 Description: M0102- Theraskin per 1sq cm extra small -6 sq cm ICD-10 Diagnosis Description S21.001A Unspecified open wound of right breast, initial encounter E11.622 Type 2 diabetes mellitus with other skin  ulcer L98.492 Non-pressure chronic ulcer of  skin of other sites with fat layer exposed C50.411 Malignant neoplasm of upper-outer quadrant of right female breast Modifier: Quantity: 6 Physician Procedures : CPT4 Code Description Modifier [id number] 15271 - WC PHYS SKIN SUB GRAFT TRNK/ARM/LEG ICD-10 Diagnosis Description Karina Foster, Karina Foster ([id number]) [phone] S21.001A Unspecified open wound of right breast, initial encounter E11.622 Type 2  diabetes mellitus with other skin ulcer L98.492 Non-pressure chronic ulcer of skin of other sites with fat layer exposed C50.411 Malignant neoplasm of upper-outer quadrant of right female breast Quantity: 1 [id number] Page 9 of 9 : [id number] - WC PHYS TX WOUND VAC < 50 SQ CM ICD-10 Diagnosis Description S21.001A Unspecified open wound of right breast, initial encounter L98.492 Non-pressure chronic ulcer of skin of other sites with fat layer exposed C50.411 Malignant neoplasm  of upper-outer quadrant of right female breast E11.622 Type 2 diabetes mellitus with other skin ulcer Quantity: 1 Electronic Signature(s) Signed: 03/07/2023 12:25:16 PM By: Pearletha Alfred Signed: 03/07/2023 1:38:23 PM By: Geralyn Corwin DO Previous Signature: 03/01/2023 4:25:00 PM Version By: Geralyn Corwin DO Entered By: Pearletha Alfred on 03/07/2023 12:25:16

## 2023-03-04 NOTE — Progress Notes (Signed)
[place], [place] ([id number]) [id number] Page 1 of 8 Visit Report for 03/01/2023 Arrival Information Details Patient Name: Date of Service: Karina Foster, Karina Foster 03/01/2023 3:45 PM Medical Record Number: [id number] Patient Account Number: [id number] Date of Birth/Sex: Treating RN: [birth year] (82 y.o. Karina Foster Primary Care Karina Foster: Karina Foster Other Clinician: Referring Karina Foster: Treating Karina Foster/Extender: Karina Foster Foster in Treatment: 9 Visit Information History Since Last Visit Added or deleted any medications: No Patient Arrived: Ambulatory Any new allergies or adverse reactions: No Arrival Time: 15:38 Had a fall or experienced change in No Accompanied By: self activities of daily living that may affect Transfer Assistance: None risk of falls: Patient Identification Verified: Yes Signs or symptoms of abuse/neglect since last visito No Secondary Verification Process Completed: Yes Hospitalized since last visit: No Patient Requires Transmission-Based Precautions: No Implantable device outside of the clinic excluding No Patient Has Alerts: No cellular tissue based products placed in the center since last visit: Has Dressing in Place as Prescribed: Yes Pain Present Now: No Electronic Signature(s) Signed: 03/02/2023 3:47:51 PM By: Karina Foster Entered By: Karina Foster on 03/01/2023 15:50:24 -------------------------------------------------------------------------------- Encounter Discharge Information Details Patient Name: Date of Service: Karina Foster 03/01/2023 3:45 PM Medical Record Number: [id number] Patient Account Number: [id number] Date of Birth/Sex: Treating RN: 21-Apr-1941 (82 y.o. Karina Foster Primary Care [name]: Karina Foster Other Clinician: Referring Karina Foster: Treating Karina Foster/Extender: Karina Foster Foster in Treatment: 9 Encounter Discharge  Information Items Post Procedure Vitals Discharge Condition: Stable Temperature (F): 97.6 Ambulatory Status: Ambulatory Pulse (bpm): 96 Discharge Destination: Home Respiratory Rate (breaths/min): 18 Transportation: Private Auto Blood Pressure (mmHg): 152/71 Accompanied By: self Schedule Follow-up Appointment: Yes Clinical Summary of Care: Patient Declined Electronic Signature(s) Signed: 03/02/2023 3:47:51 PM By: Karina Foster Entered By: Karina Foster on 03/01/2023 16:13:40 Foster, Karina Medin ([id number]) [id number] Page 2 of 8 -------------------------------------------------------------------------------- Lower Extremity Assessment Details Patient Name: Date of Service: Karina Foster, Karina Foster 03/01/2023 3:45 PM Medical Record Number: [id number] Patient Account Number: [id number] Date of Birth/Sex: Treating RN: 11/10/1940 (82 y.o. Karina Foster Primary Care Raad Clayson: Karina Foster Other Clinician: Referring [name]: Treating Juandavid Dallman/Extender: Karina Foster, Karina Foster in Treatment: 9 Electronic Signature(s) Signed: 03/02/2023 3:47:51 PM By: Karina Foster By: Karina Foster on 03/01/2023 15:52:56 -------------------------------------------------------------------------------- Multi Wound Chart Details Patient Name: Date of Service: Karina Foster, Karina Foster 03/01/2023 3:45 PM Medical Record Number: [id number] Patient Account Number: [id number] Date of Birth/Sex: Treating RN: July 10, 1941 (82 y.o. F) F) Primary Care Karina Foster: Karina Foster Other Clinician: Referring [name]: Treating Marna Weniger/Extender: Karina Foster, Karina Foster in Treatment: 9 Vital Signs Height(in): 67 Capillary Blood Glucose(mg/dl): 063 Weight(lbs): 016 Pulse(bpm): 96 Body Mass Index(BMI): 23.5 Blood Pressure(mmHg): 152/71 Temperature(F): 97.6 Respiratory Rate(breaths/min): 18 [1:Photos:] [N/A:N/A] Right Axilla N/A  N/A Wound Location: Surgical Injury N/A N/A Wounding Event: Open Surgical Wound N/A N/A Primary Etiology: Sleep Apnea, Coronary Artery N/A N/A Comorbid History: Disease, Myocardial Infarction, Type II Diabetes, Neuropathy, Received Chemotherapy, Received Radiation 10/14/2022 N/A N/A Date Acquired: 9 N/A N/A Foster of Treatment: Open N/A N/A Wound Status: No N/A N/A Wound Recurrence: 1.5x0.5x0.1 N/A N/A Measurements L x W x D (cm) 0.589 N/A N/A A (cm) : rea 0.059 N/A N/A Volume (cm) : 92.90% N/A N/A % Reduction in A rea: 99.50% N/A N/A % Reduction in Volume: 12 Position 1 (o'clock): 0.4 Maximum Distance 1 (cm): Yes N/A N/A Tunneling: Full Thickness With Exposed Support N/A N/A Classification: Structures Medium N/A  N/A Exudate Amount: Karina Foster ([id number]) [id number] Page 3 of 8 Serosanguineous N/A N/A Exudate Type: red, brown N/A N/A Exudate Color: Distinct, outline attached N/A N/A Wound Margin: Large (67-100%) N/A N/A Granulation Amount: Red, Pink N/A N/A Granulation Quality: Small (1-33%) N/A N/A Necrotic Amount: Fat Layer (Subcutaneous Tissue): Yes N/A N/A Exposed Structures: Fascia: No Tendon: No Muscle: No Joint: No Bone: No Medium (34-66%) N/A N/A Epithelialization: Excoriation: No N/A N/A Periwound Skin Texture: Induration: No Callus: No Crepitus: No Rash: No Scarring: No Maceration: No N/A N/A Periwound Skin Moisture: Dry/Scaly: No Atrophie Blanche: No N/A N/A Periwound Skin Color: Cyanosis: No Ecchymosis: No Erythema: No Hemosiderin Staining: No Mottled: No Pallor: No Rubor: No Cellular or Tissue Based Product N/A N/A Procedures Performed: Negative Pressure Wound Therapy Maintenance (NPWT) Treatment Notes Wound #1 (Axilla) Wound Laterality: Right Cleanser Vashe 5.8 (oz) Discharge Instruction: Cleanse the wound with Vashe prior to applying a clean dressing using gauze sponges, not tissue or  cotton balls. Peri-Wound Care Topical Primary Dressing ADAPTIC TOUCH 3x4.25 (in/in) Discharge Instruction: Apply to wound bed over skin sub DuoDERM CGF Border Dressing Square, 6x6 (in/in) Discharge Instruction: when using the wound vac Theraskin Discharge Instruction: Skin Sub to Axilla/Breast Secondary Dressing Drawtex 4x4 in Discharge Instruction: Apply over primary dressing/ adaptic Secured With WOUND [facility] Discharge Instruction: to Right Axilla/Breast Compression Wrap Compression Stockings Add-Ons Electronic Signature(s) Signed: 03/01/2023 4:25:00 PM By: Geralyn Corwin DO Entered By: Geralyn Corwin on 03/01/2023 16:20:10 Wicke, Karina Medin ([id number]) [id number] Page 4 of 8 -------------------------------------------------------------------------------- Multi-Disciplinary Care Plan Details Patient Name: Date of Service: Karina Foster, Karina Foster 03/01/2023 3:45 PM Medical Record Number: [id number] Patient Account Number: [id number] Date of Birth/Sex: Treating RN: May 17, 1941 (82 y.o. Karina Foster Primary Care Jaquelinne Glendening: Karina Foster Other Clinician: Referring Yousif Edelson: Treating Jnaya Butrick/Extender: Karina Foster, Karina Foster in Treatment: 9 Active Inactive Negative Pressure Wound Therapy Nursing Diagnoses: Knowledge deficit related to use and safety of the device Goals: Patient/caregiver agrees to and verbalizes understanding of need to use Date Initiated: 02/01/2023 Target Resolution Date: 04/08/2023 Goal Status: Active Interventions: Assess drainage (amount and color) Assess patient nutrition upon admission and as needed per policy Assess patient understanding of disease process and management Assess patient/caregiver ability to obtain necessary supplies Assess wound bed for efficacy of treatment Monitor and protect skin around the wound Provide education on use, care, and troubleshooting Treatment  Activities: Incontinence or moisture care in place (i.e., Barrier creams, foley catheter, colostomy, diapers, etc.) : 02/01/2023 Referred to DME Oneal Biglow for dressing supplies : 02/01/2023 Turning and Repositioning schedule in place : 02/01/2023 Notes: Electronic Signature(s) Signed: 03/02/2023 3:47:51 PM By: Karina Foster Entered By: Karina Foster on 03/01/2023 15:54:30 -------------------------------------------------------------------------------- Negative Pressure Wound Therapy Maintenance (NPWT) Details Patient Name: Date of Service: Karina Foster, Karina Foster 03/01/2023 3:45 PM Medical Record Number: [id number] Patient Account Number: [id number] Date of Birth/Sex: Treating RN: [birth year] (82 y.o. Karina Foster Primary Care Linday Rhodes: Karina Foster Other Clinician: Referring Kahlil Cowans: Treating Ghina Bittinger/Extender: Karina Foster, Karina Foster in Treatment: 9 NPWT Maintenance Performed for: Wound #1 Right Axilla Performed By: Karina Bruin, RN Type: VAC System Coverage Size (sq cm): 0.75 Pressure Type: Constant Pressure Setting: 125 mmHG Drain Type: None Primary Contact: Non-Adherent Sponge/Dressing Type: Foam- Black Date Initiated: 01/18/2023 Dressing Removed: Yes Quantity of Sponges/Gauze Removed: 1 Canister Changed: Yes Dressing Reapplied: Yes Quantity of Sponges/Gauze Inserted: 1 Days On NPWT : [address], [place] ([id number]) [id number] Page 5 of 8 Post Procedure Diagnosis Same  as Architectural technologist) Signed: 03/02/2023 3:47:51 PM By: Karina Foster Entered By: Karina Foster on 03/01/2023 16:09:10 -------------------------------------------------------------------------------- Pain Assessment Details Patient Name: Date of Service: Karina Foster, Karina Foster 03/01/2023 3:45 PM Medical Record Number: [id number] Patient Account Number: [id number] Date of Birth/Sex: Treating RN: 06-Nov-1940 (82 y.o. Karina Foster Primary Care Keiron Iodice: Karina Foster Other Clinician: Referring Jaklyn Alen: Treating Glyn Zendejas/Extender: Karina Foster Foster in Treatment: 9 Active Problems Location of Pain Severity and Description of Pain Patient Has Paino No Site Locations Rate the pain. Current Pain Level: 0 Pain Management and Medication Current Pain Management: Electronic Signature(s) Signed: 03/02/2023 3:47:51 PM By: Karina Foster Entered By: Karina Foster on 03/01/2023 15:52:52 -------------------------------------------------------------------------------- Patient/Caregiver Education Details Patient Name: Date of Service: Karina Foster 6/25/2024andnbsp3:45 PM Medical Record Number: [id number] Patient Account Number: [id number] Date of Birth/Gender: Treating RN: [birth year] (82 y.o. Karina Foster Primary Care Physician: Karina Foster Other Clinician: Referring Physician: Treating Physician/Extender: Karina Foster Foster in Treatment: [address], [place] ([id number]) [id number] Page 6 of 8 Education Assessment Education Provided To: Patient Education Topics Provided Wound/Skin Impairment: Methods: Explain/Verbal Responses: Reinforcements needed, State content correctly Electronic Signature(s) Signed: 03/02/2023 3:47:51 PM By: Karina Foster Entered By: Karina Foster on 03/01/2023 15:55:11 -------------------------------------------------------------------------------- Wound Assessment Details Patient Name: Date of Service: Karina Foster, Karina Foster 03/01/2023 3:45 PM Medical Record Number: [id number] Patient Account Number: [id number] Date of Birth/Sex: Treating RN: 1941/05/19 (82 y.o. Karina Foster Primary Care Ebrahim Deremer: Karina Foster Other Clinician: Referring Darrius Montano: Treating Journee Kohen/Extender: Karina Foster, Karina Foster in Treatment: 9 Wound  Status Wound Number: 1 Primary Open Surgical Wound Etiology: Wound Location: Right Axilla Wound Open Wounding Event: Surgical Injury Status: Date Acquired: 10/14/2022 Comorbid Sleep Apnea, Coronary Artery Disease, Myocardial Infarction, Type Foster Of Treatment: 9 History: II Diabetes, Neuropathy, Received Chemotherapy, Received Clustered Wound: No Radiation Photos Wound Measurements Length: (cm) 1.5 Width: (cm) 0.5 Depth: (cm) 0.1 Area: (cm) 0.589 Volume: (cm) 0.059 % Reduction in Area: 92.9% % Reduction in Volume: 99.5% Epithelialization: Medium (34-66%) Tunneling: Yes Position (o'clock): 12 Maximum Distance: (cm) 0.4 Undermining: No Wound Description Classification: Full Thickness With Exposed Support Structures Wound Margin: Distinct, outline attached Exudate Amount: Medium Exudate Type: Serosanguineous Exudate Color: red, brown Schwarzkopf, Josey G ([id number]) Wound Bed Granulation Amount: Large (67-100%) Granulation Quality: Red, Pink Necrotic Amount: Small (1-33%) Necrotic Quality: Adherent Slough Foul Odor After Cleansing: No Slough/Fibrino Yes [phone].pdf Page 7 of 8 Exposed Structure Fascia Exposed: No Fat Layer (Subcutaneous Tissue) Exposed: Yes Tendon Exposed: No Muscle Exposed: No Joint Exposed: No Bone Exposed: No Periwound Skin Texture Texture Color No Abnormalities Noted: No No Abnormalities Noted: No Callus: No Atrophie Blanche: No Crepitus: No Cyanosis: No Excoriation: No Ecchymosis: No Induration: No Erythema: No Rash: No Hemosiderin Staining: No Scarring: No Mottled: No Pallor: No Moisture Rubor: No No Abnormalities Noted: No Dry / Scaly: No Maceration: No Treatment Notes Wound #1 (Axilla) Wound Laterality: Right Cleanser Vashe 5.8 (oz) Discharge Instruction: Cleanse the wound with Vashe prior to applying a clean dressing using gauze sponges, not tissue or cotton balls. Peri-Wound Care Topical Primary  Dressing ADAPTIC TOUCH 3x4.25 (in/in) Discharge Instruction: Apply to wound bed over skin sub DuoDERM CGF Border Dressing Square, 6x6 (in/in) Discharge Instruction: when using the wound vac Theraskin Discharge Instruction: Skin Sub to Axilla/Breast Secondary Dressing Drawtex 4x4 in Discharge Instruction: Apply over primary dressing/ adaptic Secured With WOUND [facility] Discharge Instruction: to Right Axilla/Breast Compression Wrap Compression Stockings  Add-Ons Electronic Signature(s) Signed: 03/02/2023 3:47:51 PM By: Karina Foster Signed: 03/04/2023 11:18:15 AM By: Thayer Dallas Entered By: Thayer Dallas on 03/01/2023 15:58:57 -------------------------------------------------------------------------------- Vitals Details Patient Name: Date of Service: Karina Foster, Karina Foster 03/01/2023 3:45 PM Medical Record Number: [id number] Patient Account Number: [id number] Date of Birth/Sex: Treating RN: 06/18/1941 (82 y.o. [address] Foster [place], [place] ([id number]) [id number] Page 8 of 8 Primary Care Brandis Wixted: Chilton Greathouse R Other Clinician: Referring Yazlyn Wentzel: Treating Beata Beason/Extender: Karina Foster, Karina Foster in Treatment: 9 Vital Signs Time Taken: 15:50 Temperature (F): 97.6 Height (in): 67 Pulse (bpm): 96 Weight (lbs): 150 Respiratory Rate (breaths/min): 18 Body Mass Index (BMI): 23.5 Blood Pressure (mmHg): 152/71 Capillary Blood Glucose (mg/dl): 782 Reference Range: 80 - 120 mg / dl Electronic Signature(s) Signed: 03/02/2023 3:47:51 PM By: Karina Foster Entered By: Karina Foster on 03/01/2023 15:52:39

## 2023-03-07 ENCOUNTER — Encounter (HOSPITAL_BASED_OUTPATIENT_CLINIC_OR_DEPARTMENT_OTHER): Payer: Medicare Other | Attending: Internal Medicine | Admitting: Internal Medicine

## 2023-03-07 DIAGNOSIS — Z9011 Acquired absence of right breast and nipple: Secondary | ICD-10-CM | POA: Diagnosis not present

## 2023-03-07 DIAGNOSIS — E11622 Type 2 diabetes mellitus with other skin ulcer: Secondary | ICD-10-CM | POA: Insufficient documentation

## 2023-03-07 DIAGNOSIS — S21001A Unspecified open wound of right breast, initial encounter: Secondary | ICD-10-CM

## 2023-03-07 DIAGNOSIS — Z87891 Personal history of nicotine dependence: Secondary | ICD-10-CM | POA: Diagnosis not present

## 2023-03-07 DIAGNOSIS — C50411 Malignant neoplasm of upper-outer quadrant of right female breast: Secondary | ICD-10-CM | POA: Insufficient documentation

## 2023-03-07 DIAGNOSIS — L98492 Non-pressure chronic ulcer of skin of other sites with fat layer exposed: Secondary | ICD-10-CM | POA: Diagnosis not present

## 2023-03-07 NOTE — Progress Notes (Signed)
[address], [place] ([id number]) [id number] Page 1 of 7 Visit Report for 03/07/2023 Arrival Information Details Patient Name: Date of Service: Karina Foster, Karina Foster 03/07/2023 1:15 PM Medical Record Number: [id number] Patient Account Number: [id number] Date of Birth/Sex: Treating RN: 09/29/1940 (82 y.o. F) Primary Care Shaquana Buel: Hoyle Sauer Other Clinician: Referring Carola Viramontes: Treating Aleysia Oltmann/Extender: Jodie Echevaria, Ravisankar R Weeks in Treatment: 10 Visit Information History Since Last Visit Added or deleted any medications: No Patient Arrived: Ambulatory Any new allergies or adverse reactions: No Arrival Time: 13:50 Had a fall or experienced change in No Accompanied By: self activities of daily living that may affect Transfer Assistance: None risk of falls: Patient Identification Verified: Yes Signs or symptoms of abuse/neglect since last visito No Secondary Verification Process Completed: Yes Hospitalized since last visit: No Patient Requires Transmission-Based Precautions: No Implantable device outside of the clinic excluding No Patient Has Alerts: No cellular tissue based products placed in the center since last visit: Has Dressing in Place as Prescribed: Yes Pain Present Now: No Electronic Signature(s) Signed: 03/07/2023 4:27:23 PM By: Thayer Dallas Entered By: Thayer Dallas on 03/07/2023 14:00:19 -------------------------------------------------------------------------------- Encounter Discharge Information Details Patient Name: Date of Service: Karina Foster 03/07/2023 1:15 PM Medical Record Number: [id number] Patient Account Number: [id number] Date of Birth/Sex: Treating RN: 22-Feb-1941 (82 y.o. Arta Silence Primary Care Ciela Mahajan: Hoyle Sauer Other Clinician: Referring Merridy Pascoe: Treating Tahjae Clausing/Extender: Vevelyn Royals Weeks in Treatment: 10 Encounter Discharge Information Items Post Procedure  Vitals Discharge Condition: Stable Temperature (F): 98.4 Ambulatory Status: Ambulatory Pulse (bpm): 80 Discharge Destination: Home Respiratory Rate (breaths/min): 20 Transportation: Private Auto Blood Pressure (mmHg): 135/75 Accompanied By: self Schedule Follow-up Appointment: Yes Clinical Summary of Care: Electronic Signature(s) Signed: 03/07/2023 4:45:04 PM By: Shawn Stall RN, BSN Entered By: Shawn Stall on 03/07/2023 14:39:34 Brickle, [name] ([id number]) [id number] Page 2 of 7 -------------------------------------------------------------------------------- Lower Extremity Assessment Details Patient Name: Date of Service: Karina Foster 03/07/2023 1:15 PM Medical Record Number: [id number] Patient Account Number: [id number] Date of Birth/Sex: Treating RN: [birth year] (82 y.o. F) Primary Care Abelardo Seidner: Hoyle Sauer Other Clinician: Referring Camika Marsico: Treating Zakhi Dupre/Extender: Jodie Echevaria, Ravisankar R Weeks in Treatment: 10 Electronic Signature(s) Signed: 03/07/2023 4:27:23 PM By: Thayer Dallas Entered By: Thayer Dallas on 03/07/2023 14:10:56 -------------------------------------------------------------------------------- Multi Wound Chart Details Patient Name: Date of Service: Karina Foster 03/07/2023 1:15 PM Medical Record Number: [id number] Patient Account Number: [id number] Date of Birth/Sex: Treating RN: Mar 11, 1941 (82 y.o. F) Primary Care Kanda Deluna: Chilton Greathouse R Other Clinician: Referring Cochise Dinneen: Treating Ravneet Spilker/Extender: Jodie Echevaria, Ravisankar R Weeks in Treatment: 10 Vital Signs Height(in): 67 Capillary Blood Glucose(mg/dl): 536 Weight(lbs): 644 Pulse(bpm): 80 Body Mass Index(BMI): 23.5 Blood Pressure(mmHg): 135/75 Temperature(F): 98.4 Respiratory Rate(breaths/min): 18 [1:Photos:] [N/A:N/A] Right Axilla N/A N/A Wound Location: Surgical Injury N/A N/A Wounding Event: Open Surgical Wound  N/A N/A Primary Etiology: Sleep Apnea, Coronary Artery N/A N/A Comorbid History: Disease, Myocardial Infarction, Type II Diabetes, Neuropathy, Received Chemotherapy, Received Radiation 10/14/2022 N/A N/A Date Acquired: 10 N/A N/A Weeks of Treatment: Open N/A N/A Wound Status: No N/A N/A Wound Recurrence: 0.6x0.5x0.1 N/A N/A Measurements L x W x D (cm) 0.236 N/A N/A A (cm) : rea 0.024 N/A N/A Volume (cm) : 97.20% N/A N/A % Reduction in Area: 99.80% N/A N/A % Reduction in Volume: Full Thickness With Exposed Support N/A N/A Classification: Structures Medium N/A N/A Exudate Amount: Serosanguineous N/A N/A Exudate Type: red, brown N/A N/A Exudate Color: Distinct, outline attached N/A  N/A Wound Margin: MAUDY, JENG ([id number]) [id number] Page 3 of 7 Large (67-100%) N/A N/A Granulation Amount: Red, Pink N/A N/A Granulation Quality: None Present (0%) N/A N/A Necrotic Amount: Fat Layer (Subcutaneous Tissue): Yes N/A N/A Exposed Structures: Fascia: No Tendon: No Muscle: No Joint: No Bone: No Medium (34-66%) N/A N/A Epithelialization: Excoriation: No N/A N/A Periwound Skin Texture: Induration: No Callus: No Crepitus: No Rash: No Scarring: No Maceration: No N/A N/A Periwound Skin Moisture: Dry/Scaly: No Atrophie Blanche: No N/A N/A Periwound Skin Color: Cyanosis: No Ecchymosis: No Erythema: No Hemosiderin Staining: No Mottled: No Pallor: No Rubor: No Cellular or Tissue Based Product N/A N/A Procedures Performed: Negative Pressure Wound Therapy Maintenance (NPWT) Treatment Notes Electronic Signature(s) Signed: 03/07/2023 2:53:49 PM By: Geralyn Corwin DO Entered By: Geralyn Corwin on 03/07/2023 14:38:40 -------------------------------------------------------------------------------- Multi-Disciplinary Care Plan Details Patient Name: Date of Service: Karina Foster 03/07/2023 1:15 PM Medical Record Number:  [id number] Patient Account Number: [id number] Date of Birth/Sex: Treating RN: 1940-10-27 (82 y.o. Arta Silence Primary Care Leea Rambeau: Hoyle Sauer Other Clinician: Referring Sheccid Lahmann: Treating Elyjah Hazan/Extender: Jodie Echevaria, Ravisankar R Weeks in Treatment: 10 Active Inactive Negative Pressure Wound Therapy Nursing Diagnoses: Knowledge deficit related to use and safety of the device Goals: Patient/caregiver agrees to and verbalizes understanding of need to use Date Initiated: 02/01/2023 Target Resolution Date: 04/08/2023 Goal Status: Active Interventions: Assess drainage (amount and color) Assess patient nutrition upon admission and as needed per policy Assess patient understanding of disease process and management Assess patient/caregiver ability to obtain necessary supplies Assess wound bed for efficacy of treatment Monitor and protect skin around the wound Provide education on use, care, and troubleshooting Treatment Activities: Incontinence or moisture care in place (i.e., Barrier creams, foley catheter, colostomy, diapers, etc.) : 02/01/2023 FEATHER, EUSTAQUIO ([id number]) [id number] Page 4 of 7 Referred to DME Breyden Jeudy for dressing supplies : 02/01/2023 Turning and Repositioning schedule in place : 02/01/2023 Notes: Electronic Signature(s) Signed: 03/07/2023 4:45:04 PM By: Shawn Stall RN, BSN Entered By: Shawn Stall on 03/07/2023 14:15:37 -------------------------------------------------------------------------------- Negative Pressure Wound Therapy Maintenance (NPWT) Details Patient Name: Date of Service: JAMESIA, URIBE 03/07/2023 1:15 PM Medical Record Number: [id number] Patient Account Number: [id number] Date of Birth/Sex: Treating RN: 05-16-1941 (82 y.o. Arta Silence Primary Care Mayrin Schmuck: Hoyle Sauer Other Clinician: Referring Tinita Brooker: Treating Tiara Maultsby/Extender: Jodie Echevaria, Ravisankar R Weeks in  Treatment: 10 NPWT Maintenance Performed for: Wound #1 Right Axilla Performed By: Shawn Stall, RN Type: VAC System Coverage Size (sq cm): 0.3 Pressure Type: Constant Pressure Setting: 125 mmHG Drain Type: None Primary Contact: Non-Adherent Sponge/Dressing Type: Foam- Black Date Initiated: 01/18/2023 Quantity of Sponges/Gauze Removed: x1 black foam Canister Changed: No Canister Exudate Volume: 25 Dressing Reapplied: Yes Quantity of Sponges/Gauze Inserted: x1 black foam Respones T Treatment: o tolerated well Days On NPWT : 49 Post Procedure Diagnosis Same as Pre-procedure Electronic Signature(s) Signed: 03/07/2023 4:45:04 PM By: Shawn Stall RN, BSN Entered By: Shawn Stall on 03/07/2023 14:38:32 -------------------------------------------------------------------------------- Pain Assessment Details Patient Name: Date of Service: Karina Foster 03/07/2023 1:15 PM Medical Record Number: [id number] Patient Account Number: [id number] Date of Birth/Sex: Treating RN: 03-06-1941 (82 y.o. F) Primary Care Niamh Rada: Hoyle Sauer Other Clinician: Referring Roslin Norwood: Treating Raequon Catanzaro/Extender: Jodie Echevaria, Ravisankar R Weeks in Treatment: 10 Active Problems Location of Pain Severity and Description of Pain Patient Has Paino No Site Locations [place], [place] ([id number]) [phone].pdf Page 5 of 7 Pain Management and Medication Current Pain Management: Electronic Signature(s) Signed:  03/07/2023 4:27:23 PM By: Thayer Dallas Entered By: Thayer Dallas on 03/07/2023 14:10:49 -------------------------------------------------------------------------------- Patient/Caregiver Education Details Patient Name: Date of Service: Karina Foster 7/1/2024andnbsp1:15 PM Medical Record Number: [id number] Patient Account Number: [id number] Date of Birth/Gender: Treating RN: 1941-05-18 (82 y.o. Arta Silence Primary Care Physician: Hoyle Sauer  Other Clinician: Referring Physician: Treating Physician/Extender: Otho Najjar in Treatment: 10 Education Assessment Education Provided To: Patient Education Topics Provided Wound/Skin Impairment: Handouts: Caring for Your Ulcer Methods: Explain/Verbal Responses: Reinforcements needed Electronic Signature(s) Signed: 03/07/2023 4:45:04 PM By: Shawn Stall RN, BSN Entered By: Shawn Stall on 03/07/2023 14:15:47 -------------------------------------------------------------------------------- Wound Assessment Details Patient Name: Date of Service: Karina Foster 03/07/2023 1:15 PM Medical Record Number: [id number] Patient Account Number: [id number] Date of Birth/Sex: Treating RN: 11-16-1940 (82 y.o. F) Primary Care Damyah Gugel: Hoyle Sauer Other Clinician: MIRKA, UPRIGHT ([id number]) [id number] Page 6 of 7 Referring Jamiya Nims: Treating Jamorion Gomillion/Extender: Jodie Echevaria, Ravisankar R Weeks in Treatment: 10 Wound Status Wound Number: 1 Primary Open Surgical Wound Etiology: Wound Location: Right Axilla Wound Open Wounding Event: Surgical Injury Status: Date Acquired: 10/14/2022 Comorbid Sleep Apnea, Coronary Artery Disease, Myocardial Infarction, Type Weeks Of Treatment: 10 History: II Diabetes, Neuropathy, Received Chemotherapy, Received Clustered Wound: No Radiation Photos Wound Measurements Length: (cm) 0.6 Width: (cm) 0.5 Depth: (cm) 0.1 Area: (cm) 0.236 Volume: (cm) 0.024 % Reduction in Area: 97.2% % Reduction in Volume: 99.8% Epithelialization: Medium (34-66%) Tunneling: No Undermining: No Wound Description Classification: Full Thickness With Exposed Support Structures Wound Margin: Distinct, outline attached Exudate Amount: Medium Exudate Type: Serosanguineous Exudate Color: red, brown Foul Odor After Cleansing: No Slough/Fibrino Yes Wound Bed Granulation Amount: Large (67-100%) Exposed  Structure Granulation Quality: Red, Pink Fascia Exposed: No Necrotic Amount: None Present (0%) Fat Layer (Subcutaneous Tissue) Exposed: Yes Tendon Exposed: No Muscle Exposed: No Joint Exposed: No Bone Exposed: No Periwound Skin Texture Texture Color No Abnormalities Noted: No No Abnormalities Noted: No Callus: No Atrophie Blanche: No Crepitus: No Cyanosis: No Excoriation: No Ecchymosis: No Induration: No Erythema: No Rash: No Hemosiderin Staining: No Scarring: No Mottled: No Pallor: No Moisture Rubor: No No Abnormalities Noted: No Dry / Scaly: No Maceration: No Treatment Notes Wound #1 (Axilla) Wound Laterality: Right Cleanser Vashe 5.8 (oz) Discharge Instruction: Cleanse the wound with Vashe prior to applying a clean dressing using gauze sponges, not tissue or cotton balls. Peri-Wound Care Topical EASTHER, GALLEY ([id number]) [id number] Page 7 of 7 Primary Dressing ADAPTIC TOUCH 3x4.25 (in/in) Discharge Instruction: Apply to wound bed over skin sub DuoDERM CGF Border Dressing Square, 6x6 (in/in) Discharge Instruction: when using the wound vac [place] Discharge Instruction: Skin Sub to Axilla/Breast Vashe wet to dry Discharge Instruction: On Friday 03/11/2023 remove all dressings and apply vashe wet to dry daily till return to wound center. Secondary Dressing Drawtex 4x4 in Discharge Instruction: Apply over primary dressing/ adaptic Secured With WOUND [facility] Discharge Instruction: to Right Axilla/Breast Compression Wrap Compression Stockings Add-Ons Electronic Signature(s) Signed: 03/07/2023 4:45:04 PM By: Shawn Stall RN, BSN Entered By: Shawn Stall on 03/07/2023 14:26:32 -------------------------------------------------------------------------------- Vitals Details Patient Name: Date of Service: Karina Foster 03/07/2023 1:15 PM Medical Record Number: [id number] Patient Account Number: [id number] Date of Birth/Sex: Treating  RN: 21-Feb-1941 (82 y.o. F) Primary Care Tyris Eliot: Hoyle Sauer Other Clinician: Referring Harith Mccadden: Treating Leilanni Halvorson/Extender: Jodie Echevaria, Ravisankar R Weeks in Treatment: 10 Vital Signs Time Taken: 14:00 Temperature (F): 98.4 Height (in): 67 Pulse (bpm): 80 Weight (lbs): 150 Respiratory Rate (  breaths/min): 18 Body Mass Index (BMI): 23.5 Blood Pressure (mmHg): 135/75 Capillary Blood Glucose (mg/dl): 960 Reference Range: 80 - 120 mg / dl Electronic Signature(s) Signed: 03/07/2023 4:27:23 PM By: Thayer Dallas Entered By: Thayer Dallas on 03/07/2023 14:10:41

## 2023-03-07 NOTE — Progress Notes (Addendum)
[address] [place] ([id number]) [id number] Page 1 of 9 Visit Report for 03/07/2023 Chief Complaint Document Details Patient Name: Date of Service: Karina Foster, Karina Foster 03/07/2023 1:15 PM Medical Record Number: [id number] Patient Account Number: [id number] Date of Birth/Sex: Treating RN: 1941-05-11 (82 y.o. F) Primary Care Provider: Hoyle Sauer Other Clinician: Referring Provider: Treating Provider/Extender: Jodie Echevaria, Ravisankar R Weeks in Treatment: 10 Information Obtained from: Patient Chief Complaint 12/24/2022; Right-sided wound status postmastectomy Electronic Signature(s) Signed: 03/07/2023 2:53:49 PM By: Geralyn Corwin DO Entered By: Geralyn Corwin on 03/07/2023 14:39:01 -------------------------------------------------------------------------------- Cellular or Tissue Based Product Details Patient Name: Date of Service: Karina Foster, Karina Foster 03/07/2023 1:15 PM Medical Record Number: [id number] Patient Account Number: [id number] Date of Birth/Sex: Treating RN: [birth year] (82 y.o. Arta Silence Primary Care Provider: Hoyle Sauer Other Clinician: Referring Provider: Treating Provider/Extender: Vevelyn Royals Weeks in Treatment: 10 Cellular or Tissue Based Product Type Wound #1 Right Axilla Applied to: Performed By: Physician Geralyn Corwin, DO Cellular or Tissue Based Product Type: Theraskin Level of Consciousness (Pre-procedure): Awake and Alert Pre-procedure Verification/Time Out Yes - 14:30 Taken: Location: trunk / arms / legs Wound Size (sq cm): 0.3 Product Size (sq cm): 6 Waste Size (sq cm): 3 Waste Reason: wound size Amount of Product Applied (sq cm): 3 Instrument Used: Forceps, Scissors Lot #: [id number] Order #: 4 Expiration Date: 09/16/2027 Fenestrated: No Reconstituted: Yes Solution Type: normal saline Solution Amount: 10 mL Lot #: [id number] Solution Expiration Date: 02/03/2025 Secured:  Yes Secured With: Steri-Strips, adaptic Dressing Applied: No Procedural Pain: 0 Post Procedural Pain: 0 Response to Treatment: Procedure was tolerated well Level of Consciousness (Post- Awake and [address] Karina, LEATHERMAN ([id number]) [id number] Page 2 of 9 procedure): Post Procedure Diagnosis Same as Pre-procedure Electronic Signature(s) Signed: 03/07/2023 4:20:44 PM By: Geralyn Corwin DO Signed: 03/07/2023 4:45:04 PM By: Shawn Stall RN, BSN Previous Signature: 03/07/2023 2:53:49 PM Version By: Geralyn Corwin DO Entered By: Shawn Stall on 03/07/2023 15:09:34 -------------------------------------------------------------------------------- HPI Details Patient Name: Date of Service: Karina Foster, Karina Foster 03/07/2023 1:15 PM Medical Record Number: [id number] Patient Account Number: [id number] Date of Birth/Sex: Treating RN: [birth year] (82 y.o. F) Primary Care Provider: Hoyle Sauer Other Clinician: Referring Provider: Treating Provider/Extender: Jodie Echevaria, Ravisankar R Weeks in Treatment: 10 History of Present Illness HPI Description: 12/24/2022 Ms. Karina Foster is a 82 year old female with a past medical history of controlled type 2 diabetes, right sided breast cancer status postmastectomy pursuing chemotherapy that presents the clinic for a 22-month history of nonhealing surgical wound dehiscence to the right side status postmastectomy. On 10/14/2022 patient had a right mastectomy by Dr. Donell Beers and subsequently developed cellulitis and wound dehiscence. She has been using saline wet-to-dry dressings. She currently denies signs of infection. She did not tolerate her first round of chemotherapy and this is being placed on hold for now. She did not have radiation or implants. She does state that 27 years ago she had a lumpectomy to the site with radiation. 5/3; patient presents for follow-up. She has been approved for the wound VAC and has this present  with her today. She lives at [facility] and there are nurses there that can change the wound VAC twice a week. She has been using Vashe wet-to-dry dressings. Wound is stable. 5/14; patient presents for follow-up. She has been using the wound VAC. She has some irritation to where the drape is placed. We will start Replicare to help with this issue. 5/21; patient presents for follow-up.  She has been using the wound VAC. The irritation to the periwound has improved nicely with Replicare/DuoDERM. Wound is smaller. She has no issues or complaints today. 5/28; patient presents for follow-up. We have been using the wound VAC. She has no issues or complaints today. Wound is smaller. She has slough accumulation. 6/4; patient has been using a wound VAC and the wound actually looks quite healthy. The 1 cm superior tunnel is probably about the same. Some adherent slough and fibrinous debris on the surface of the wound. There has been problems with wound VAC supplies specifically she has no black foam 6/11; patient presents for follow-up. She has been approved for TheraSkin and she was agreeable to having this placed today. She has been using the wound VAC without issues. 6/17; patient presents for follow-up. Theraskin was placed in standard fashion at last clinic visit. The wound VAC was continued. She has no issues or complaints today. Wound is smaller. 6/25; patient presents for follow-up. We have been using TheraSkin to the wound bed. Wound is smaller. We are also using a wound VAC. She has no issues or complaints today. 7/1; patient presents for follow-up. We have been using TheraSkin to the wound bed along with a wound VAC. The wound is smaller today. She is going out of town for 3 weeks and does not want to continue the wound VAC. We will place this on hold for now. Electronic Signature(s) Signed: 03/07/2023 2:53:49 PM By: Geralyn Corwin DO Entered By: Geralyn Corwin on 03/07/2023  14:39:35 -------------------------------------------------------------------------------- Physical Exam Details Patient Name: Date of Service: Karina Foster, Karina Foster 03/07/2023 1:15 PM [place], [name] ([id number]) [id number] Page 3 of 9 Medical Record Number: [id number] Patient Account Number: [id number] Date of Birth/Sex: Treating RN: [birth year] (82 y.o. F) Primary Care Provider: Hoyle Sauer Other Clinician: Referring Provider: Treating Provider/Extender: Jodie Echevaria, Ravisankar R Weeks in Treatment: 10 Constitutional respirations regular, non-labored and within target range for patient.Marland Kitchen Psychiatric pleasant and cooperative. Notes Right side postmastectomy surgical site towards the right axilla with open wound with granulation tissue. No signs of surrounding infection. Electronic Signature(s) Signed: 03/07/2023 2:53:49 PM By: Geralyn Corwin DO Entered By: Geralyn Corwin on 03/07/2023 14:39:58 -------------------------------------------------------------------------------- Physician Orders Details Patient Name: Date of Service: Karina Foster, Karina Foster 03/07/2023 1:15 PM Medical Record Number: [id number] Patient Account Number: [id number] Date of Birth/Sex: Treating RN: 01-22-1941 (82 y.o. Arta Silence Primary Care Provider: Hoyle Sauer Other Clinician: Referring Provider: Treating Provider/Extender: Vevelyn Royals Weeks in Treatment: 10 Verbal / Phone Orders: No Diagnosis Coding ICD-10 Coding Code Description S21.001A Unspecified open wound of right breast, initial encounter L98.492 Non-pressure chronic ulcer of skin of other sites with fat layer exposed C50.411 Malignant neoplasm of upper-outer quadrant of right female breast E11.622 Type 2 diabetes mellitus with other skin ulcer Follow-up Appointments Return appointment in 3 weeks. - Dr. Mikey Bussing Monday 03/28/2023 1230pm room 7 (after your trip to the beach  please return) Other: - DO NOT REMOVE SKIN SUB OR ADAPTIC OR STERI STRIPS- home health to remove on Friday all products the theraskin, adaptic, steri- strips, and wound vac. Wound vac on hold for two weeks until patient returns to wound center on Monday 03/28/2023. Bathing/ Shower/ Hygiene May shower with protection but do not get wound dressing(s) wet. Protect dressing(s) with water repellant cover (for example, large plastic bag) or a cast cover and may then take shower. Negative Presssure Wound Therapy Wound Vac to wound continuously at 151mm/hg pressure -  Medela wound vac with white and black foam combination. ***HOLD FOR TWO WEEKS.***** Black and White Foam combination Home Health New wound care orders this week; continue Home Health for wound care. May utilize formulary equivalent dressing for wound treatment orders unless otherwise specified. - [facility] nurses to hold wound vac x2 weeks. Nurse to remove all dressings on Friday and apply Vashe wet to dry. Wound Treatment Wound #1 - Axilla Wound Laterality: Right Cleanser: Vashe 5.8 (oz) 1 x Per Day/15 Days Discharge Instructions: Cleanse the wound with Vashe prior to applying a clean dressing using gauze sponges, not tissue or cotton balls. Prim Dressing: ADAPTIC TOUCH 3x4.25 (in/in) 1 x Per Day/15 Days ary Discharge Instructions: Apply to wound bed over skin sub AYZEL, STIGALL ([id number]) [id number] Page 4 of 9 Prim Dressing: DuoDERM CGF Border Dressing Square, 6x6 (in/in) (Generic) 1 x Per Day/15 Days ary Discharge Instructions: when using the wound vac Prim Dressing: Theraskin 1 x Per Day/15 Days ary Discharge Instructions: Skin Sub to Axilla/Breast Prim Dressing: Vashe wet to dry 1 x Per Day/15 Days ary Discharge Instructions: On Friday 03/11/2023 remove all dressings and apply vashe wet to dry daily till return to wound center. Secondary Dressing: Drawtex 4x4 in 1 x Per Day/15 Days Discharge  Instructions: Apply over primary dressing/ adaptic Secured With: WOUND VAC 1 x Per Day/15 Days Discharge Instructions: to Right Axilla/Breast Electronic Signature(s) Signed: 03/07/2023 2:53:49 PM By: Geralyn Corwin DO Entered By: Geralyn Corwin on 03/07/2023 14:40:05 -------------------------------------------------------------------------------- Problem List Details Patient Name: Date of Service: Karina Foster 03/07/2023 1:15 PM Medical Record Number: [id number] Patient Account Number: [id number] Date of Birth/Sex: Treating RN: 12/30/1940 (82 y.o. Arta Silence Primary Care Provider: Hoyle Sauer Other Clinician: Referring Provider: Treating Provider/Extender: Vevelyn Royals Weeks in Treatment: 10 Active Problems ICD-10 Encounter Code Description Active Date MDM Diagnosis S21.001A Unspecified open wound of right breast, initial encounter 12/24/2022 No Yes L98.492 Non-pressure chronic ulcer of skin of other sites with fat layer exposed 02/01/2023 No Yes C50.411 Malignant neoplasm of upper-outer quadrant of right female breast 12/24/2022 No Yes E11.622 Type 2 diabetes mellitus with other skin ulcer 12/24/2022 No Yes Inactive Problems Resolved Problems Electronic Signature(s) Signed: 03/07/2023 2:53:49 PM By: Geralyn Corwin DO Entered By: Geralyn Corwin on 03/07/2023 14:38:28 [place], [name] ([id number]) [id number] Page 5 of 9 -------------------------------------------------------------------------------- Progress Note Details Patient Name: Date of Service: Karina Foster, Karina Foster 03/07/2023 1:15 PM Medical Record Number: [id number] Patient Account Number: [id number] Date of Birth/Sex: Treating RN: 1941/04/30 (82 y.o. F) Primary Care Provider: Hoyle Sauer Other Clinician: Referring Provider: Treating Provider/Extender: Jodie Echevaria, Ravisankar R Weeks in Treatment: 10 Subjective Chief Complaint Information  obtained from Patient 12/24/2022; Right-sided wound status postmastectomy History of Present Illness (HPI) 12/24/2022 Ms. Karina Foster is a 82 year old female with a past medical history of controlled type 2 diabetes, right sided breast cancer status postmastectomy pursuing chemotherapy that presents the clinic for a 74-month history of nonhealing surgical wound dehiscence to the right side status postmastectomy. On 10/14/2022 patient had a right mastectomy by Dr. Donell Beers and subsequently developed cellulitis and wound dehiscence. She has been using saline wet-to-dry dressings. She currently denies signs of infection. She did not tolerate her first round of chemotherapy and this is being placed on hold for now. She did not have radiation or implants. She does state that 27 years ago she had a lumpectomy to the site with radiation. 5/3; patient presents for follow-up. She has been approved  for the wound VAC and has this present with her today. She lives at [facility] and there are nurses there that can change the wound VAC twice a week. She has been using Vashe wet-to-dry dressings. Wound is stable. 5/14; patient presents for follow-up. She has been using the wound VAC. She has some irritation to where the drape is placed. We will start Replicare to help with this issue. 5/21; patient presents for follow-up. She has been using the wound VAC. The irritation to the periwound has improved nicely with Replicare/DuoDERM. Wound is smaller. She has no issues or complaints today. 5/28; patient presents for follow-up. We have been using the wound VAC. She has no issues or complaints today. Wound is smaller. She has slough accumulation. 6/4; patient has been using a wound VAC and the wound actually looks quite healthy. The 1 cm superior tunnel is probably about the same. Some adherent slough and fibrinous debris on the surface of the wound. There has been problems with wound VAC supplies specifically she has no  black foam 6/11; patient presents for follow-up. She has been approved for TheraSkin and she was agreeable to having this placed today. She has been using the wound VAC without issues. 6/17; patient presents for follow-up. Theraskin was placed in standard fashion at last clinic visit. The wound VAC was continued. She has no issues or complaints today. Wound is smaller. 6/25; patient presents for follow-up. We have been using TheraSkin to the wound bed. Wound is smaller. We are also using a wound VAC. She has no issues or complaints today. 7/1; patient presents for follow-up. We have been using TheraSkin to the wound bed along with a wound VAC. The wound is smaller today. She is going out of town for 3 weeks and does not want to continue the wound VAC. We will place this on hold for now. Patient History Information obtained from Patient, Chart. Family History Unknown History. Social History Former smoker, Marital Status - Married, Alcohol Use - Never, Drug Use - No History, Caffeine Use - Rarely. Medical History Respiratory Patient has history of Sleep Apnea Cardiovascular Patient has history of Coronary Artery Disease, Myocardial Infarction Endocrine Patient has history of Type II Diabetes Neurologic Patient has history of Neuropathy - HANDS and feet Oncologic Patient has history of Received Chemotherapy - right breast, Received Radiation - right breat ca Hospitalization/Surgery History - right total mastectomy. - portacath placement. - breast biopsy right. - cataract extraction. - coronary angioplasty with stent placement. [address], [place] ([id number]) [id number] Page 6 of 9 Objective Constitutional respirations regular, non-labored and within target range for patient.. Vitals Time Taken: 2:00 PM, Height: 67 in, Weight: 150 lbs, BMI: 23.5, Temperature: 98.4 F, Pulse: 80 bpm, Respiratory Rate: 18 breaths/min, Blood Pressure: 135/75 mmHg, Capillary Blood  Glucose: 150 mg/dl. Psychiatric pleasant and cooperative. General Notes: Right side postmastectomy surgical site towards the right axilla with open wound with granulation tissue. No signs of surrounding infection. Integumentary (Hair, Skin) Wound #1 status is Open. Original cause of wound was Surgical Injury. The date acquired was: 10/14/2022. The wound has been in treatment 10 weeks. The wound is located on the Right Axilla. The wound measures 0.6cm length x 0.5cm width x 0.1cm depth; 0.236cm^2 area and 0.024cm^3 volume. There is Fat Layer (Subcutaneous Tissue) exposed. There is no tunneling or undermining noted. There is a medium amount of serosanguineous drainage noted. The wound margin is distinct with the outline attached to the wound base. There is large (67-100%) red, pink  granulation within the wound bed. There is no necrotic tissue within the wound bed. The periwound skin appearance did not exhibit: Callus, Crepitus, Excoriation, Induration, Rash, Scarring, Dry/Scaly, Maceration, Atrophie Blanche, Cyanosis, Ecchymosis, Hemosiderin Staining, Mottled, Pallor, Rubor, Erythema. 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 The tunneled area is epithelialized. She will be out of town for the next 3 weeks and does not want to continue the wound VAC. She will leave on Saturday, July 6. For now I recommended reapplying TheraSkin and continuing the wound VAC until she leaves from out of town and then she can do a simple wet-to-dry dressing. Ideally I would do a different dressing however I will not be able to address well any issues if they were to arise. She knows to call with any  questions or concerns. Follow-up in 3 weeks. Procedures Wound #1 Pre-procedure diagnosis of Wound #1 is an Open Surgical Wound located on the Right Axilla. A skin graft procedure using a bioengineered skin substitute/cellular or tissue based product was performed by Geralyn Corwin, DO with the following instrument(s): Forceps and Scissors. Theraskin was applied and secured with Steri-Strips and adaptic. 3 sq cm of product was utilized and 3 sq cm was wasted due to wound size. Post Application, no dressing was applied. A Time Out was conducted at 14:30, prior to the start of the procedure. The procedure was tolerated well with a pain level of 0 throughout and a pain level of 0 following the procedure. Post procedure Diagnosis Wound #1: Same as Pre-Procedure . Plan Follow-up Appointments: Return appointment in 3 weeks. - Dr. Mikey Bussing Monday 03/28/2023 1230pm room 7 (after your trip to the beach please return) Other: - DO NOT REMOVE SKIN SUB OR ADAPTIC OR STERI STRIPS- home health to remove on Friday all products the theraskin, adaptic, steri-strips, and wound vac. Wound vac on hold for two weeks until patient returns to wound center on Monday 03/28/2023. Bathing/ Shower/ Hygiene: May shower with protection but do not get wound dressing(s) wet. Protect dressing(s) with water repellant cover (for example, large plastic bag) or a cast cover and may then take shower. Negative Presssure Wound Therapy: Wound Vac to wound continuously at 143mm/hg pressure - Medela wound vac with white and black foam combination. ***HOLD FOR TWO WEEKS.***** Black and White Foam combination Home Health: New wound care orders this week; continue Home Health for wound care. May utilize formulary equivalent dressing for wound treatment orders unless otherwise specified. - [facility] nurses to hold wound vac x2 weeks. Nurse to remove all dressings on Friday and apply Vashe wet to dry. WOUND #1: - Axilla Wound Laterality:  Right Cleanser: Vashe 5.8 (oz) 1 x Per Day/15 Days Discharge Instructions: Cleanse the wound with Vashe prior to applying a clean dressing using gauze sponges, not tissue or cotton balls. Prim Dressing: ADAPTIC TOUCH 3x4.25 (in/in) 1 x Per Day/15 Days ary Discharge Instructions: Apply to wound bed over skin sub Prim Dressing: DuoDERM CGF Border Dressing Square, 6x6 (in/in) (Generic) 1 x Per Day/15 Days ary Discharge Instructions: when using the wound vac Karina Foster, Karina Foster ([id number]) [id number] Page 7 of 9 Prim Dressing: Theraskin 1 x Per Day/15 Days ary Discharge Instructions: Skin Sub to Axilla/Breast Prim Dressing: Vashe wet to  dry 1 x Per Day/15 Days ary Discharge Instructions: On Friday 03/11/2023 remove all dressings and apply vashe wet to dry daily till return to wound center. Secondary Dressing: Drawtex 4x4 in 1 x Per Day/15 Days Discharge Instructions: Apply over primary dressing/ adaptic Secured With: WOUND VAC 1 x Per Day/15 Days Discharge Instructions: to Right Axilla/Breast 1. TheraSkin placed in standard fashion today 2. Continue wound VAC 3. T off wound VAC and TheraSkin on 7/5 prior to beach trip and start Vashe wet-to-dry dressings daily ake 4. Follow-up in 3 weeks Electronic Signature(s) Signed: 03/07/2023 4:20:44 PM By: Geralyn Corwin DO Signed: 03/07/2023 4:45:04 PM By: Shawn Stall RN, BSN Previous Signature: 03/07/2023 2:53:49 PM Version By: Geralyn Corwin DO Entered By: Shawn Stall on 03/07/2023 15:09:50 -------------------------------------------------------------------------------- HxROS Details Patient Name: Date of Service: Karina Foster, Karina Foster 03/07/2023 1:15 PM Medical Record Number: [id number] Patient Account Number: [id number] Date of Birth/Sex: Treating RN: [birth year] (82 y.o. F) Primary Care Provider: Hoyle Sauer Other Clinician: Referring Provider: Treating Provider/Extender: Jodie Echevaria, Ravisankar R Weeks  in Treatment: 10 Information Obtained From Patient Chart Respiratory Medical History: Positive for: Sleep Apnea Cardiovascular Medical History: Positive for: Coronary Artery Disease; Myocardial Infarction Endocrine Medical History: Positive for: Type II Diabetes Treated with: Insulin Neurologic Medical History: Positive for: Neuropathy - HANDS and feet Oncologic Medical History: Positive for: Received Chemotherapy - right breast; Received Radiation - right breat ca Immunizations Pneumococcal Vaccine: Received Pneumococcal Vaccination: Yes Received Pneumococcal Vaccination On or After 60th Birthday: Yes Implantable [address] [place] ([id number]) [id number] Page 8 of 9 Hospitalization / Surgery History Type of Hospitalization/Surgery right total mastectomy portacath placement breast biopsy right cataract extraction coronary angioplasty with stent placement Family and Social History Unknown History: Yes; Former smoker; Marital Status - Married; Alcohol Use: Never; Drug Use: No History; Caffeine Use: Rarely; Financial Concerns: No; Food, Clothing or Shelter Needs: No; Support System Lacking: No; Transportation Concerns: No Electronic Signature(s) Signed: 03/07/2023 2:53:49 PM By: Geralyn Corwin DO Entered By: Geralyn Corwin on 03/07/2023 14:39:41 -------------------------------------------------------------------------------- SuperBill Details Patient Name: Date of Service: Karina Foster 03/07/2023 Medical Record Number: [id number] Patient Account Number: [id number] Date of Birth/Sex: Treating RN: [birth year] (82 y.o. 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 E11.622 Type 2 diabetes mellitus with other skin ulcer Facility Procedures : CPT4 Code: [id number] Description: 15271 - SKIN SUB GRAFT TRNK/ARM/LEG ICD-10 Diagnosis Description L98.492 Non-pressure chronic ulcer of skin of other sites with fat layer exposed Modifier: Quantity: 1 : CPT4 Code: 43329518 Description: 84166 - WOUND VAC-50 SQ CM OR LESS ICD-10 Diagnosis Description L98.492 Non-pressure chronic ulcer of skin of other sites with fat layer exposed Modifier: Quantity: 1 : CPT4 Code: [id number] Description: U9323- Theraskin per 1sq cm extra small -6 sq cm ICD-10 Diagnosis Description L98.492 Non-pressure chronic ulcer of skin of other sites with fat layer exposed Modifier: Quantity: 6 Physician Procedures : CPT4 Code Description Modifier [id number] 15271 - WC PHYS SKIN SUB GRAFT TRNK/ARM/LEG ICD-10 Diagnosis Description 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

## 2023-03-27 NOTE — Progress Notes (Signed)
Patient Care Team: Chilton Greathouse, MD as PCP - General (Internal Medicine) Chilton Greathouse, MD as Consulting Physician (Internal Medicine) Serena Croissant, MD as Consulting Physician (Hematology and Oncology) Pershing Proud, RN as Oncology Nurse Navigator Donnelly Angelica, RN as Oncology Nurse Navigator  DIAGNOSIS: No diagnosis found.  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  MEDICATIONS:  Current Outpatient Medications  Medication Sig Dispense Refill   acetaminophen (TYLENOL) 500 MG tablet Take 2 tablets (1,000 mg total) by mouth every 6 (six) hours. (Patient taking differently: Take 1,000 mg by mouth 2 (two) times daily.) 30 tablet 0   aspirin 325 MG tablet Take 325 mg by mouth daily.     BD PEN NEEDLE NANO U/F 32G X 4 MM MISC SMARTSIG:2 Pen Needle SUB-Q Daily     Bempedoic Acid-Ezetimibe (NEXLIZET) 180-10 MG TABS Take 1 tablet by mouth daily. (Patient taking differently: Take 1 tablet by mouth every other day.) 90 tablet 3   Cholecalciferol (VITAMIN D3) 25 MCG (1000 UT) tablet Take 1,000 Units by mouth daily.     citalopram (CELEXA) 40 MG tablet Take 40 mg by mouth daily.     Continuous Blood Gluc Sensor (FREESTYLE LIBRE 14 DAY  SENSOR) MISC Apply topically every 14 (fourteen) days.     Cranberry Soft 500 MG CHEW Chew 500 mg by mouth daily.     fluocinonide gel (LIDEX) 0.05 % Apply 1 Application topically daily as needed (irritation on tongue).     ibuprofen (ADVIL) 200 MG tablet Take 200 mg by mouth every 6 (six) hours as needed for moderate pain.     insulin lispro (HUMALOG) 100 UNIT/ML injection Inject 0.3 mLs (30 Units total) into the skin 3 (three) times daily before meals. 10 mL 11   lidocaine (XYLOCAINE) 2 % solution Use as directed 15 mLs in the mouth or throat every 6 (six) hours as needed for mouth pain.     lidocaine-prilocaine (EMLA) cream Apply to affected area once (Patient taking differently: Apply 1 Application topically as needed (port). Apply to affected area once) 30 g 3   magic mouthwash (lidocaine, diphenhydrAMINE, alum & mag hydroxide) suspension Swish and spit 5 mLs by mouth 4 (four) times daily as needed for mouth pain. 240 mL 1   metoprolol tartrate (LOPRESSOR) 25 MG tablet TAKE 1/2 TABLET TWICE DAILY 180 tablet 3   Multiple Vitamins-Minerals (PRESERVISION AREDS) CAPS Take 1 capsule by mouth in the morning and at bedtime.     nitroGLYCERIN (NITROSTAT) 0.4 MG SL tablet Place 0.4 mg under the tongue every 5 (five) minutes as needed for chest pain.     ramipril (ALTACE) 2.5 MG capsule Take 2.5 mg by mouth daily.     Sodium Chloride-Sodium Bicarb (SODIUM  BICARBONATE/SODIUM CHLORIDE) SOLN Swish and spit as needed for mouth pain     tiZANidine (ZANAFLEX) 2 MG tablet Take 1 tablet (2 mg total) by mouth every 8 (eight) hours as needed for muscle spasms. (Patient not taking: Reported on 12/23/2022) 20 tablet 1   TRESIBA FLEXTOUCH 200 UNIT/ML FlexTouch Pen Inject 32 Units into the skin daily after lunch. We've reduced your insulin dose, please follow up with your PCP outpatient and adjust this further as needed. (Patient taking differently: Inject 64 Units into the skin daily after lunch. We've reduced your  insulin dose, please follow up with your PCP outpatient and adjust this further as needed.)     valACYclovir (VALTREX) 500 MG tablet Take 1 tablet (500 mg total) by mouth 2 (two) times daily. 60 tablet 0   vitamin B-12 (CYANOCOBALAMIN) 1000 MCG tablet Take 1,000 mcg by mouth daily.     No current facility-administered medications for this visit.    PHYSICAL EXAMINATION: ECOG PERFORMANCE STATUS: {CHL ONC ECOG PS:[phone]}  There were no vitals filed for this visit. There were no vitals filed for this visit.  BREAST:*** No palpable masses or nodules in either right or left breasts. No palpable axillary supraclavicular or infraclavicular adenopathy no breast tenderness or nipple discharge. (exam performed in the presence of a chaperone)  LABORATORY DATA:  I have reviewed the data as listed    Latest Ref Rng & Units 02/04/2023   10:11 AM 01/14/2023    9:57 AM 12/24/2022    9:50 AM  CMP  Glucose 70 - 99 mg/dL 657  846  962   BUN 8 - 23 mg/dL 19  17  21    Creatinine 0.44 - 1.00 mg/dL 9.52  8.41  3.24   Sodium 135 - 145 mmol/L 137  139  136   Potassium 3.5 - 5.1 mmol/L 4.4  3.8  4.5   Chloride 98 - 111 mmol/L 103  103  103   CO2 22 - 32 mmol/L 26  26  25    Calcium 8.9 - 10.3 mg/dL 9.5  9.5  9.8   Total Protein 6.5 - 8.1 g/dL 7.5  7.8  7.5   Total Bilirubin 0.3 - 1.2 mg/dL 0.4  0.7  0.4   Alkaline Phos 38 - 126 U/L 47  49  44   AST 15 - 41 U/L 16  15  19    ALT 0 - 44 U/L 18  16  15      Lab Results  Component Value Date   WBC 5.2 02/04/2023   HGB 11.9 (L) 02/04/2023   HCT 35.9 (L) 02/04/2023   MCV 95.0 02/04/2023   PLT 264 02/04/2023   NEUTROABS 3.1 02/04/2023    ASSESSMENT & PLAN:  No problem-specific Assessment & Plan notes found for this encounter.    No orders of the defined types were placed in this encounter.  The patient has a good understanding of the overall plan. she agrees with it. she will call with any problems that may develop before the next visit  here. Total time spent: 30 mins including face to face time and time spent for planning, charting and co-ordination of care   Sherlyn Lick, CMA 03/27/23    I Janan Ridge am acting as a Neurosurgeon for [employer]  ***

## 2023-03-28 ENCOUNTER — Encounter (HOSPITAL_BASED_OUTPATIENT_CLINIC_OR_DEPARTMENT_OTHER): Payer: Medicare Other | Admitting: Internal Medicine

## 2023-03-28 DIAGNOSIS — Z9011 Acquired absence of right breast and nipple: Secondary | ICD-10-CM | POA: Diagnosis not present

## 2023-03-28 DIAGNOSIS — S21001A Unspecified open wound of right breast, initial encounter: Secondary | ICD-10-CM

## 2023-03-28 DIAGNOSIS — Z87891 Personal history of nicotine dependence: Secondary | ICD-10-CM | POA: Diagnosis not present

## 2023-03-28 DIAGNOSIS — C50411 Malignant neoplasm of upper-outer quadrant of right female breast: Secondary | ICD-10-CM

## 2023-03-28 DIAGNOSIS — L98492 Non-pressure chronic ulcer of skin of other sites with fat layer exposed: Secondary | ICD-10-CM | POA: Diagnosis not present

## 2023-03-28 DIAGNOSIS — E11622 Type 2 diabetes mellitus with other skin ulcer: Secondary | ICD-10-CM

## 2023-03-28 MED FILL — Fosaprepitant Dimeglumine For IV Infusion 150 MG (Base Eq): INTRAVENOUS | Qty: 5 | Status: AC

## 2023-03-28 MED FILL — Dexamethasone Sodium Phosphate Inj 100 MG/10ML: INTRAMUSCULAR | Qty: 1 | Status: AC

## 2023-03-28 NOTE — Progress Notes (Signed)
[address], [place] ([id number]) [id number] Page 1 of 7 Visit Report for 03/28/2023 Chief Complaint Document Details Patient Name: Date of Service: Karina Foster, Karina Foster 03/28/2023 12:30 PM Medical Record Number: [id number] Patient Account Number: [id number] Date of Birth/Sex: Treating RN: 1940/12/11 (82 y.o. F) Primary Care Provider: Hoyle Sauer Other Clinician: Referring Provider: Treating Provider/Extender: Jodie Echevaria, Ravisankar R Weeks in Treatment: 13 Information Obtained from: Patient Chief Complaint 12/24/2022; Right-sided wound status postmastectomy Electronic Signature(s) Signed: 03/28/2023 4:55:26 PM By: Geralyn Corwin DO Entered By: Geralyn Corwin on 03/28/2023 13:27:27 -------------------------------------------------------------------------------- HPI Details Patient Name: Date of Service: Karina Foster. 03/28/2023 12:30 PM Medical Record Number: [id number] Patient Account Number: [id number] Date of Birth/Sex: Treating RN: [birth year] (82 y.o. F) Primary Care Provider: Hoyle Sauer Other Clinician: Referring Provider: Treating Provider/Extender: Jodie Echevaria, Ravisankar R Weeks in Treatment: 13 History of Present Illness HPI Description: 12/24/2022 Ms. Karina Foster is a 82 year old female with a past medical history of controlled type 2 diabetes, right sided breast cancer status postmastectomy pursuing chemotherapy that presents the clinic for a 97-month history of nonhealing surgical wound dehiscence to the right side status postmastectomy. On 10/14/2022 patient had a right mastectomy by Dr. Donell Beers and subsequently developed cellulitis and wound dehiscence. She has been using saline wet-to-dry dressings. She currently denies signs of infection. She did not tolerate her first round of chemotherapy and this is being placed on hold for now. She did not have radiation or implants. She does state that 27 years ago she had  a lumpectomy to the site with radiation. 5/3; patient presents for follow-up. She has been approved for the wound VAC and has this present with her today. She lives at [facility] and there are nurses there that can change the wound VAC twice a week. She has been using Vashe wet-to-dry dressings. Wound is stable. 5/14; patient presents for follow-up. She has been using the wound VAC. She has some irritation to where the drape is placed. We will start Replicare to help with this issue. 5/21; patient presents for follow-up. She has been using the wound VAC. The irritation to the periwound has improved nicely with Replicare/DuoDERM. Wound is smaller. She has no issues or complaints today. 5/28; patient presents for follow-up. We have been using the wound VAC. She has no issues or complaints today. Wound is smaller. She has slough accumulation. 6/4; patient has been using a wound VAC and the wound actually looks quite healthy. The 1 cm superior tunnel is probably about the same. Some adherent slough and fibrinous debris on the surface of the wound. There has been problems with wound VAC supplies specifically she has no black foam 6/11; patient presents for follow-up. She has been approved for TheraSkin and she was agreeable to having this placed today. She has been using the wound VAC without issues. 6/17; patient presents for follow-up. Theraskin was placed in standard fashion at last clinic visit. The wound VAC was continued. She has no issues or complaints today. Wound is smaller. 6/25; patient presents for follow-up. We have been using TheraSkin to the wound bed. Wound is smaller. We are also using a wound VAC. She has no issues or complaints today. [address], [place] ([id number]) [id number] Page 2 of 7 7/1; patient presents for follow-up. We have been using TheraSkin to the wound bed along with a wound VAC. The wound is smaller today. She is going out of town for 3 weeks  and does not want to continue the wound  VAC. We will place this on hold for now. 7/22; patient presents for follow-up. She was out of town for the past 3 weeks on a beach trip for her birthday. TheraSkin was placed at last clinic visit and she took the skin substitute off as directed at the end of the week it was placed. She has been using Vashe wet-to-dry dressings. Her wound is healed. Electronic Signature(s) Signed: 03/28/2023 4:55:26 PM By: Geralyn Corwin DO Entered By: Geralyn Corwin on 03/28/2023 13:29:06 -------------------------------------------------------------------------------- Physical Exam Details Patient Name: Date of Service: Karina Foster, Karina Foster 03/28/2023 12:30 PM Medical Record Number: [id number] Patient Account Number: [id number] Date of Birth/Sex: Treating RN: 01-04-1941 (82 y.o. F) Primary Care Provider: Hoyle Sauer Other Clinician: Referring Provider: Treating Provider/Extender: Jodie Echevaria, Ravisankar R Weeks in Treatment: 13 Constitutional respirations regular, non-labored and within target range for patient.Marland Kitchen Psychiatric pleasant and cooperative. Notes Right side postmastectomy surgical site towards the right axilla with Epithelization to the previous wound site. Electronic Signature(s) Signed: 03/28/2023 4:55:26 PM By: Geralyn Corwin DO Entered By: Geralyn Corwin on 03/28/2023 13:29:36 -------------------------------------------------------------------------------- Physician Orders Details Patient Name: Date of Service: Karina Foster, Karina Foster 03/28/2023 12:30 PM Medical Record Number: [id number] Patient Account Number: [id number] Date of Birth/Sex: Treating RN: 05-May-1941 (82 y.o. Debara Pickett, Millard.Loa Primary Care Provider: Hoyle Sauer Other Clinician: Referring Provider: Treating Provider/Extender: Vevelyn Royals Weeks in Treatment: 80 Verbal / Phone Orders: No Diagnosis Coding ICD-10 Coding Code  Description S21.001A Unspecified open wound of right breast, initial encounter L98.492 Non-pressure chronic ulcer of skin of other sites with fat layer exposed C50.411 Malignant neoplasm of upper-outer quadrant of right female breast E11.622 Type 2 diabetes mellitus with other skin ulcer Discharge From [facility] Services Discharge from Wound Care Center - Call if any future wound care needs. Electronic Signature(s) Signed: 03/28/2023 4:55:26 PM By: Jolaine Artist, Signed: 03/28/2023 4:55:26 PM By: Maretta Bees ([id number]) [id number] Page 3 of 7 Entered By: Geralyn Corwin on 03/28/2023 13:29:45 -------------------------------------------------------------------------------- Problem List Details Patient Name: Date of Service: Karina Foster, Karina Foster 03/28/2023 12:30 PM Medical Record Number: [id number] Patient Account Number: [id number] Date of Birth/Sex: Treating RN: 1941/06/04 (82 y.o. Arta Silence Primary Care Provider: Hoyle Sauer Other Clinician: Referring Provider: Treating Provider/Extender: Vevelyn Royals Weeks in Treatment: 65 Active Problems ICD-10 Encounter Code Description Active Date MDM Diagnosis S21.001A Unspecified open wound of right breast, initial encounter 12/24/2022 No Yes L98.492 Non-pressure chronic ulcer of skin of other sites with fat layer exposed 02/01/2023 No Yes C50.411 Malignant neoplasm of upper-outer quadrant of right female breast 12/24/2022 No Yes E11.622 Type 2 diabetes mellitus with other skin ulcer 12/24/2022 No Yes Inactive Problems Resolved Problems Electronic Signature(s) Signed: 03/28/2023 4:55:26 PM By: Geralyn Corwin DO Entered By: Geralyn Corwin on 03/28/2023 13:27:14 -------------------------------------------------------------------------------- Progress Note Details Patient Name: Date of Service: Karina Foster 03/28/2023 12:30 PM Medical Record Number:  [id number] Patient Account Number: [id number] Date of Birth/Sex: Treating RN: 22-Jan-1941 (82 y.o. F) Primary Care Provider: Hoyle Sauer Other Clinician: Referring Provider: Treating Provider/Extender: Vevelyn Royals Weeks in Treatment: 73 Subjective Chief Complaint Information obtained from Patient 12/24/2022; Right-sided wound status postmastectomy History of Present Illness (HPI) Karina Foster, Karina Foster ([id number]) [id number] Page 4 of 7 12/24/2022 Ms. Karina Foster is a 82 year old female with a past medical history of controlled type 2 diabetes, right sided breast cancer status postmastectomy pursuing chemotherapy that presents the clinic for a 60-month  history of nonhealing surgical wound dehiscence to the right side status postmastectomy. On 10/14/2022 patient had a right mastectomy by Dr. Donell Beers and subsequently developed cellulitis and wound dehiscence. She has been using saline wet-to-dry dressings. She currently denies signs of infection. She did not tolerate her first round of chemotherapy and this is being placed on hold for now. She did not have radiation or implants. She does state that 27 years ago she had a lumpectomy to the site with radiation. 5/3; patient presents for follow-up. She has been approved for the wound VAC and has this present with her today. She lives at [facility] and there are nurses there that can change the wound VAC twice a week. She has been using Vashe wet-to-dry dressings. Wound is stable. 5/14; patient presents for follow-up. She has been using the wound VAC. She has some irritation to where the drape is placed. We will start Replicare to help with this issue. 5/21; patient presents for follow-up. She has been using the wound VAC. The irritation to the periwound has improved nicely with Replicare/DuoDERM. Wound is smaller. She has no issues or complaints today. 5/28; patient presents for follow-up. We have  been using the wound VAC. She has no issues or complaints today. Wound is smaller. She has slough accumulation. 6/4; patient has been using a wound VAC and the wound actually looks quite healthy. The 1 cm superior tunnel is probably about the same. Some adherent slough and fibrinous debris on the surface of the wound. There has been problems with wound VAC supplies specifically she has no black foam 6/11; patient presents for follow-up. She has been approved for TheraSkin and she was agreeable to having this placed today. She has been using the wound VAC without issues. 6/17; patient presents for follow-up. Theraskin was placed in standard fashion at last clinic visit. The wound VAC was continued. She has no issues or complaints today. Wound is smaller. 6/25; patient presents for follow-up. We have been using TheraSkin to the wound bed. Wound is smaller. We are also using a wound VAC. She has no issues or complaints today. 7/1; patient presents for follow-up. We have been using TheraSkin to the wound bed along with a wound VAC. The wound is smaller today. She is going out of town for 3 weeks and does not want to continue the wound VAC. We will place this on hold for now. 7/22; patient presents for follow-up. She was out of town for the past 3 weeks on a beach trip for her birthday. TheraSkin was placed at last clinic visit and she took the skin substitute off as directed at the end of the week it was placed. She has been using Vashe wet-to-dry dressings. Her wound is healed. Patient History Information obtained from Patient, Chart. Family History Unknown History. Social History Former smoker, Marital Status - Married, Alcohol Use - Never, Drug Use - No History, Caffeine Use - Rarely. Medical History Respiratory Patient has history of Sleep Apnea Cardiovascular Patient has history of Coronary Artery Disease, Myocardial Infarction Endocrine Patient has history of Type II  Diabetes Neurologic Patient has history of Neuropathy - HANDS and feet Oncologic Patient has history of Received Chemotherapy - right breast, Received Radiation - right breat ca Hospitalization/Surgery History - right total mastectomy. - portacath placement. - breast biopsy right. - cataract extraction. - coronary angioplasty with stent placement. Objective Constitutional respirations regular, non-labored and within target range for patient.. Vitals Time Taken: 12:45 PM, Height: 67 in, Weight: 150 lbs,  BMI: 23.5, Temperature: 98.9 F, Pulse: 88 bpm, Respiratory Rate: 16 breaths/min, Blood Pressure: 150/78 mmHg, Capillary Blood Glucose: 152 mg/dl. Psychiatric pleasant and cooperative. General Notes: Right side postmastectomy surgical site towards the right axilla with Epithelization to the previous wound site. Integumentary (Hair, Skin) Wound #1 status is Open. Original cause of wound was Surgical Injury. The date acquired was: 10/14/2022. The wound has been in treatment 13 weeks. The wound is located on the Right Axilla. The wound measures 0cm length x 0cm width x 0cm depth; 0cm^2 area and 0cm^3 volume. There is Fat Layer (Subcutaneous Tissue) exposed. There is no tunneling or undermining noted. There is a none present amount of drainage noted. The wound margin is distinct ADDELYNN, Karina Foster ([id number]) [id number] Page 5 of 7 with the outline attached to the wound base. There is no granulation within the wound bed. There is no necrotic tissue within the wound bed. The periwound skin appearance did not exhibit: Callus, Crepitus, Excoriation, Induration, Rash, Scarring, Dry/Scaly, Maceration, Atrophie Blanche, Cyanosis, Ecchymosis, Hemosiderin Staining, Mottled, Pallor, Rubor, Erythema. Assessment Active Problems ICD-10 Unspecified open wound of right breast, initial encounter Non-pressure chronic ulcer of skin of other sites with fat layer exposed Malignant neoplasm  of upper-outer quadrant of right female breast Type 2 diabetes mellitus with other skin ulcer Patient has done well with TheraSkin followed by Vashe wet-to-dry dressings over the past 2 weeks. The wound has healed. She may follow-up as needed. Plan Discharge From [facility] Services: Discharge from Wound Care Center - Call if any future wound care needs. 1. Discharge from clinic due to closed wound 2. Follow-up as needed Electronic Signature(s) Signed: 03/28/2023 4:55:26 PM By: Geralyn Corwin DO Entered By: Geralyn Corwin on 03/28/2023 13:31:17 -------------------------------------------------------------------------------- HxROS Details Patient Name: Date of Service: Karina Foster, Karina Foster 03/28/2023 12:30 PM Medical Record Number: [id number] Patient Account Number: [id number] Date of Birth/Sex: Treating RN: [birth year] (82 y.o. F) Primary Care Provider: Hoyle Sauer Other Clinician: Referring Provider: Treating Provider/Extender: Jodie Echevaria, Ravisankar R Weeks in Treatment: 13 Information Obtained From Patient Chart Respiratory Medical History: Positive for: Sleep Apnea Cardiovascular Medical History: Positive for: Coronary Artery Disease; Myocardial Infarction Endocrine Medical History: Positive for: Type II Diabetes Treated with: Insulin Neurologic Medical History: Karina Foster, Karina Foster ([id number]) [id number] Page 6 of 7 Positive for: Neuropathy - HANDS and feet Oncologic Medical History: Positive for: Received Chemotherapy - right breast; Received Radiation - right breat ca Immunizations Pneumococcal Vaccine: Received Pneumococcal Vaccination: Yes Received Pneumococcal Vaccination On or After 60th Birthday: Yes Implantable Devices Yes Hospitalization / Surgery History Type of Hospitalization/Surgery right total mastectomy portacath placement breast biopsy right cataract extraction coronary angioplasty with stent placement Family  and Social History Unknown History: Yes; Former smoker; Marital Status - Married; Alcohol Use: Never; Drug Use: No History; Caffeine Use: Rarely; Financial Concerns: No; Food, Clothing or Shelter Needs: No; Support System Lacking: No; Transportation Concerns: No Electronic Signature(s) Signed: 03/28/2023 4:55:26 PM By: Geralyn Corwin DO Entered By: Geralyn Corwin on 03/28/2023 13:29:12 -------------------------------------------------------------------------------- SuperBill Details Patient Name: Date of Service: Karina Foster 03/28/2023 Medical Record Number: [id number] Patient Account Number: [id number] Date of Birth/Sex: Treating RN: 1941/04/27 (82 y.o. Arta Silence Primary Care Provider: Hoyle Sauer Other Clinician: Referring Provider: Treating Provider/Extender: Vevelyn Royals Weeks in Treatment: 13 Diagnosis Coding ICD-10 Codes Code Description S21.001A Unspecified open wound of right breast, initial encounter L98.492 Non-pressure chronic ulcer of skin of other sites with fat layer exposed C50.411 Malignant neoplasm of  upper-outer quadrant of right female breast E11.622 Type 2 diabetes mellitus with other skin ulcer Facility Procedures : CPT4 Code: [id number] Description: 99213 - WOUND CARE VISIT-LEV 3 EST PT Modifier: Quantity: 1 Physician Procedures : CPT4 Code Description Modifier [id number] 99213 - WC PHYS LEVEL 3 - EST PT ICD-10 Diagnosis Description S21.001A Unspecified open wound of right breast, initial encounter L98.492 Non-pressure chronic ulcer of skin of other sites with fat layer exposed  C50.411 Malignant neoplasm of upper-outer quadrant of right female breast E11.622 Type 2 diabetes mellitus with other skin ulcer Karina Foster, Karina Foster ([id number]) [id number] Quantity: 1 Page 7 of 7 Electronic Signature(s) Signed: 03/28/2023 4:55:26 PM By: Geralyn Corwin DO Entered By: Geralyn Corwin on 03/28/2023  13:31:42

## 2023-03-29 ENCOUNTER — Inpatient Hospital Stay: Payer: Medicare Other | Attending: Hematology and Oncology

## 2023-03-29 ENCOUNTER — Inpatient Hospital Stay: Payer: Medicare Other

## 2023-03-29 ENCOUNTER — Inpatient Hospital Stay (HOSPITAL_BASED_OUTPATIENT_CLINIC_OR_DEPARTMENT_OTHER): Payer: Medicare Other | Admitting: Hematology and Oncology

## 2023-03-29 ENCOUNTER — Other Ambulatory Visit: Payer: Self-pay

## 2023-03-29 ENCOUNTER — Telehealth: Payer: Self-pay | Admitting: Hematology and Oncology

## 2023-03-29 ENCOUNTER — Encounter: Payer: Self-pay | Admitting: *Deleted

## 2023-03-29 VITALS — BP 120/59 | HR 80 | Temp 97.3°F | Resp 18 | Ht 67.0 in | Wt 154.0 lb

## 2023-03-29 DIAGNOSIS — C50411 Malignant neoplasm of upper-outer quadrant of right female breast: Secondary | ICD-10-CM | POA: Insufficient documentation

## 2023-03-29 DIAGNOSIS — Z170 Estrogen receptor positive status [ER+]: Secondary | ICD-10-CM

## 2023-03-29 DIAGNOSIS — Z5111 Encounter for antineoplastic chemotherapy: Secondary | ICD-10-CM | POA: Diagnosis not present

## 2023-03-29 DIAGNOSIS — R5383 Other fatigue: Secondary | ICD-10-CM | POA: Diagnosis not present

## 2023-03-29 DIAGNOSIS — Z9011 Acquired absence of right breast and nipple: Secondary | ICD-10-CM | POA: Diagnosis not present

## 2023-03-29 DIAGNOSIS — Z95828 Presence of other vascular implants and grafts: Secondary | ICD-10-CM

## 2023-03-29 LAB — CMP (CANCER CENTER ONLY)
ALT: 21 U/L (ref 0–44)
AST: 19 U/L (ref 15–41)
Albumin: 4.7 g/dL (ref 3.5–5.0)
Alkaline Phosphatase: 39 U/L (ref 38–126)
Anion gap: 9 (ref 5–15)
BUN: 21 mg/dL (ref 8–23)
CO2: 26 mmol/L (ref 22–32)
Calcium: 9.9 mg/dL (ref 8.9–10.3)
Chloride: 103 mmol/L (ref 98–111)
Creatinine: 1.14 mg/dL — ABNORMAL HIGH (ref 0.44–1.00)
GFR, Estimated: 49 mL/min — ABNORMAL LOW (ref >60)
Glucose, Bld: 158 mg/dL — ABNORMAL HIGH (ref 70–99)
Potassium: 5.2 mmol/L — ABNORMAL HIGH (ref 3.5–5.1)
Sodium: 138 mmol/L (ref 135–145)
Total Bilirubin: 0.3 mg/dL (ref 0.3–1.2)
Total Protein: 7.6 g/dL (ref 6.5–8.1)

## 2023-03-29 LAB — CBC WITH DIFFERENTIAL (CANCER CENTER ONLY)
Abs Immature Granulocytes: 0.02 10*3/uL (ref 0.00–0.07)
Basophils Absolute: 0.1 10*3/uL (ref 0.0–0.1)
Basophils Relative: 1 %
Eosinophils Absolute: 0.3 10*3/uL (ref 0.0–0.5)
Eosinophils Relative: 5 %
HCT: 36.9 % (ref 36.0–46.0)
Hemoglobin: 12.7 g/dL (ref 12.0–15.0)
Immature Granulocytes: 0 %
Lymphocytes Relative: 24 %
Lymphs Abs: 1.5 10*3/uL (ref 0.7–4.0)
MCH: 31.7 pg (ref 26.0–34.0)
MCHC: 34.4 g/dL (ref 30.0–36.0)
MCV: 92 fL (ref 80.0–100.0)
Monocytes Absolute: 0.7 10*3/uL (ref 0.1–1.0)
Monocytes Relative: 12 %
Neutro Abs: 3.6 10*3/uL (ref 1.7–7.7)
Neutrophils Relative %: 58 %
Platelet Count: 257 10*3/uL (ref 150–400)
RBC: 4.01 MIL/uL (ref 3.87–5.11)
RDW: 14.6 % (ref 11.5–15.5)
WBC Count: 6.3 10*3/uL (ref 4.0–10.5)
nRBC: 0 % (ref 0.0–0.2)

## 2023-03-29 MED ORDER — SODIUM CHLORIDE 0.9 % IV SOLN
494.0000 mg/m2 | Freq: Once | INTRAVENOUS | Status: AC
  Administered 2023-03-29: 900 mg via INTRAVENOUS
  Filled 2023-03-29: qty 45

## 2023-03-29 MED ORDER — SODIUM CHLORIDE 0.9 % IV SOLN: Freq: Once | INTRAVENOUS | Status: AC

## 2023-03-29 MED ORDER — ALTEPLASE 2 MG IJ SOLR
2.0000 mg | Freq: Once | INTRAMUSCULAR | Status: AC
  Administered 2023-03-29: 2 mg
  Filled 2023-03-29: qty 2

## 2023-03-29 MED ORDER — SODIUM CHLORIDE 0.9 % IV SOLN
150.0000 mg | Freq: Once | INTRAVENOUS | Status: AC
  Administered 2023-03-29: 150 mg via INTRAVENOUS
  Filled 2023-03-29: qty 150
  Filled 2023-03-29 (×2): qty 5

## 2023-03-29 MED ORDER — METHOTREXATE SODIUM CHEMO INJECTION (PF) 50 MG/2ML
40.0000 mg/m2 | Freq: Once | INTRAMUSCULAR | Status: AC
  Administered 2023-03-29: 74 mg via INTRAVENOUS
  Filled 2023-03-29: qty 2.96

## 2023-03-29 MED ORDER — FLUOROURACIL CHEMO INJECTION 2.5 GM/50ML
400.0000 mg/m2 | Freq: Once | INTRAVENOUS | Status: AC
  Administered 2023-03-29: 750 mg via INTRAVENOUS
  Filled 2023-03-29: qty 15

## 2023-03-29 MED ORDER — SODIUM CHLORIDE 0.9% FLUSH
10.0000 mL | Freq: Once | INTRAVENOUS | Status: AC
  Administered 2023-03-29: 10 mL

## 2023-03-29 MED ORDER — PALONOSETRON HCL INJECTION 0.25 MG/5ML
0.2500 mg | Freq: Once | INTRAVENOUS | Status: AC
  Administered 2023-03-29: 0.25 mg via INTRAVENOUS

## 2023-03-29 MED ORDER — SODIUM CHLORIDE 0.9 % IV SOLN
10.0000 mg | Freq: Once | INTRAVENOUS | Status: AC
  Administered 2023-03-29: 10 mg via INTRAVENOUS
  Filled 2023-03-29: qty 10
  Filled 2023-03-29 (×2): qty 1

## 2023-03-29 NOTE — Assessment & Plan Note (Addendum)
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 no role of antiestrogen therapy since her repeat prognostic panel was triple negative -------------------------------------------------------------------------------------------------------------------------------------------- Current treatment: Cycle 3 CMF (on hold for recovery from recent wound infection) Chemo toxicities: Hospitalization 11/21/2022-11/26/2022: Cellulitis with neutropenic fever Fatigue: Persistent Reduce the dosage of chemotherapy with cycle 3. Patient's wound has healed and the wound VAC has come out yesterday.  Return to clinic every 3 weeks for chemotherapy.

## 2023-03-29 NOTE — Telephone Encounter (Signed)
Scheduled appointments per WQ. Patient is aware of the made appointments.  

## 2023-03-29 NOTE — Progress Notes (Signed)
[address], [place] ([id number]) [id number] Page 1 of 7 Visit Report for 03/28/2023 Arrival Information Details Patient Name: Date of Service: DRAVEN, LAINE 03/28/2023 12:30 PM Medical Record Number: [id number] Patient Account Number: [id number] Date of Birth/Sex: Treating RN: Mar 15, 1941 (82 y.o. Debara Pickett, Millard.Loa Primary Care Shannah Conteh: Hoyle Sauer Other Clinician: Referring Danyle Boening: Treating Hollace Michelli/Extender: Vevelyn Royals Weeks in Treatment: 13 Visit Information History Since Last Visit Added or deleted any medications: No Patient Arrived: Ambulatory Any new allergies or adverse reactions: No Arrival Time: 12:43 Had a fall or experienced change in No Accompanied By: self activities of daily living that may affect Transfer Assistance: None risk of falls: Patient Identification Verified: Yes Signs or symptoms of abuse/neglect since last visito No Secondary Verification Process Completed: Yes Hospitalized since last visit: No Patient Requires Transmission-Based Precautions: No Implantable device outside of the clinic excluding No Patient Has Alerts: No cellular tissue based products placed in the center since last visit: Has Dressing in Place as Prescribed: Yes Pain Present Now: No Electronic Signature(s) Signed: 03/29/2023 4:56:09 PM By: Shawn Stall RN, BSN Entered By: Shawn Stall on 03/28/2023 12:44:34 -------------------------------------------------------------------------------- Clinic Level of Care Assessment Details Patient Name: Date of Service: HENDEL, GATLIFF 03/28/2023 12:30 PM Medical Record Number: [id number] Patient Account Number: [id number] Date of Birth/Sex: Treating RN: 12-07-1940 (82 y.o. Arta Silence Primary Care Keyshawna Prouse: Hoyle Sauer Other Clinician: Referring Terisha Losasso: Treating Berel Najjar/Extender: Vevelyn Royals Weeks in Treatment: 13 Clinic Level of Care  Assessment Items TOOL 4 Quantity Score X- 1 0 Use when only an EandM is performed on FOLLOW-UP visit ASSESSMENTS - Nursing Assessment / Reassessment X- 1 10 Reassessment of Co-morbidities (includes updates in patient status) X- 1 5 Reassessment of Adherence to Treatment Plan ASSESSMENTS - Wound and Skin A ssessment / Reassessment X - Simple Wound Assessment / Reassessment - one wound 1 5 []  - 0 Complex Wound Assessment / Reassessment - multiple wounds X- 1 10 Dermatologic / Skin Assessment (not related to wound area) ASSESSMENTS - Focused Assessment []  - 0 Circumferential Edema Measurements - multi extremities []  - 0 Nutritional Assessment / Counseling / Intervention Ebro ([id number]) [id number] Page 2 of 7 []  - 0 Lower Extremity Assessment (monofilament, tuning fork, pulses) []  - 0 Peripheral Arterial Disease Assessment (using hand held doppler) ASSESSMENTS - Ostomy and/or Continence Assessment and Care []  - 0 Incontinence Assessment and Management []  - 0 Ostomy Care Assessment and Management (repouching, etc.) PROCESS - Coordination of Care X - Simple Patient / Family Education for ongoing care 1 15 []  - 0 Complex (extensive) Patient / Family Education for ongoing care X- 1 10 Staff obtains Chiropractor, Records, T Results / Process Orders est []  - 0 Staff telephones HHA, Nursing Homes / Clarify orders / etc []  - 0 Routine Transfer to another Facility (non-emergent condition) []  - 0 Routine Hospital Admission (non-emergent condition) []  - 0 New Admissions / Manufacturing engineer / Ordering NPWT Apligraf, etc. , []  - 0 Emergency Hospital Admission (emergent condition) X- 1 10 Simple Discharge Coordination []  - 0 Complex (extensive) Discharge Coordination PROCESS - Special Needs []  - 0 Pediatric / Minor Patient Management []  - 0 Isolation Patient Management []  - 0 Hearing / Language / Visual special needs []  -  0 Assessment of Community assistance (transportation, D/C planning, etc.) []  - 0 Additional assistance / Altered mentation []  - 0 Support Surface(s) Assessment (bed, cushion, seat, etc.) INTERVENTIONS - Wound Cleansing / Measurement X -  Simple Wound Cleansing - one wound 1 5 []  - 0 Complex Wound Cleansing - multiple wounds X- 1 5 Wound Imaging (photographs - any number of wounds) []  - 0 Wound Tracing (instead of photographs) X- 1 5 Simple Wound Measurement - one wound []  - 0 Complex Wound Measurement - multiple wounds INTERVENTIONS - Wound Dressings X - Small Wound Dressing one or multiple wounds 1 10 []  - 0 Medium Wound Dressing one or multiple wounds []  - 0 Large Wound Dressing one or multiple wounds []  - 0 Application of Medications - topical []  - 0 Application of Medications - injection INTERVENTIONS - Miscellaneous []  - 0 External ear exam []  - 0 Specimen Collection (cultures, biopsies, blood, body fluids, etc.) []  - 0 Specimen(s) / Culture(s) sent or taken to Lab for analysis []  - 0 Patient Transfer (multiple staff / Nurse, adult / Similar devices) []  - 0 Simple Staple / Suture removal (25 or less) []  - 0 Complex Staple / Suture removal (26 or more) []  - 0 Hypo / Hyperglycemic Management (close monitor of Blood Glucose) SAOIRSE, LEGERE ([id number]) [id number] Page 3 of 7 []  - 0 Ankle / Brachial Index (ABI) - do not check if billed separately X- 1 5 Vital Signs Has the patient been seen at the hospital within the last three years: Yes Total Score: 95 Level Of Care: New/Established - Level 3 Electronic Signature(s) Signed: 03/29/2023 4:56:09 PM By: Shawn Stall RN, BSN Entered By: Shawn Stall on 03/28/2023 12:50:52 -------------------------------------------------------------------------------- Encounter Discharge Information Details Patient Name: Date of Service: [name] 03/28/2023 12:30 PM Medical Record Number:  [id number] Patient Account Number: [id number] Date of Birth/Sex: Treating RN: 1941/04/09 (82 y.o. Arta Silence Primary Care Christabell Loseke: Hoyle Sauer Other Clinician: Referring  Arana: Treating Dodie Parisi/Extender: Vevelyn Royals Weeks in Treatment: 30 Encounter Discharge Information Items Discharge Condition: Stable Ambulatory Status: Ambulatory Discharge Destination: Home Transportation: Private Auto Accompanied By: self Schedule Follow-up Appointment: No Clinical Summary of Care: Electronic Signature(s) Signed: 03/29/2023 4:56:09 PM By: Shawn Stall RN, BSN Entered By: Shawn Stall on 03/28/2023 12:51:13 -------------------------------------------------------------------------------- Lower Extremity Assessment Details Patient Name: Date of Service: ITATI, BROCKSMITH 03/28/2023 12:30 PM Medical Record Number: [id number] Patient Account Number: [id number] Date of Birth/Sex: Treating RN: 1941-07-26 (82 y.o. Arta Silence Primary Care Nyasha Rahilly: Hoyle Sauer Other Clinician: Referring Britania Shreeve: Treating Dinita Migliaccio/Extender: Vevelyn Royals Weeks in Treatment: 13 Electronic Signature(s) Signed: 03/29/2023 4:56:09 PM By: Shawn Stall RN, BSN Entered By: Shawn Stall on 03/28/2023 12:45:51 -------------------------------------------------------------------------------- Multi Wound Chart Details Patient Name: Date of Service: [name] 03/28/2023 12:30 PM Medical Record Number: [id number] Patient Account Number: [id number] SHARON, RUBIS ([id number]) [id number] Page 4 of 7 Date of Birth/Sex: Treating RN: 1941/04/15 (82 y.o. F) Primary Care Toribio Seiber: Other Clinician: Chilton Greathouse R Referring Kathline Banbury: Treating Mishayla Sliwinski/Extender: Jodie Echevaria, Ravisankar R Weeks in Treatment: 13 Vital Signs Height(in): 67 Capillary Blood Glucose(mg/dl): 937 Weight(lbs): 169 Pulse(bpm): 88 Body  Mass Index(BMI): 23.5 Blood Pressure(mmHg): 150/78 Temperature(F): 98.9 Respiratory Rate(breaths/min): 16 [1:Photos:] [N/A:N/A] Right Axilla N/A N/A Wound Location: Surgical Injury N/A N/A Wounding Event: Open Surgical Wound N/A N/A Primary Etiology: Sleep Apnea, Coronary Artery N/A N/A Comorbid History: Disease, Myocardial Infarction, Type II Diabetes, Neuropathy, Received Chemotherapy, Received Radiation 10/14/2022 N/A N/A Date Acquired: 13 N/A N/A Weeks of Treatment: Open N/A N/A Wound Status: No N/A N/A Wound Recurrence: 0x0x0 N/A N/A Measurements L x W x D (cm) 0 N/A N/A A (cm) :  rea 0 N/A N/A Volume (cm) : 100.00% N/A N/A % Reduction in Area: 100.00% N/A N/A % Reduction in Volume: Full Thickness With Exposed Support N/A N/A Classification: Structures None Present N/A N/A Exudate Amount: Distinct, outline attached N/A N/A Wound Margin: None Present (0%) N/A N/A Granulation Amount: None Present (0%) N/A N/A Necrotic Amount: Fat Layer (Subcutaneous Tissue): Yes N/A N/A Exposed Structures: Fascia: No Tendon: No Muscle: No Joint: No Bone: No Large (67-100%) N/A N/A Epithelialization: Excoriation: No N/A N/A Periwound Skin Texture: Induration: No Callus: No Crepitus: No Rash: No Scarring: No Maceration: No N/A N/A Periwound Skin Moisture: Dry/Scaly: No Atrophie Blanche: No N/A N/A Periwound Skin Color: Cyanosis: No Ecchymosis: No Erythema: No Hemosiderin Staining: No Mottled: No Pallor: No Rubor: No Treatment Notes Electronic Signature(s) Signed: 03/28/2023 4:55:26 PM By: Geralyn Corwin DO Entered By: Geralyn Corwin on 03/28/2023 13:27:20 Ezra, [name] ([id number]) [id number] Page 5 of 7 -------------------------------------------------------------------------------- Multi-Disciplinary Care Plan Details Patient Name: Date of Service: ELVERTA, DIMICELI 03/28/2023 12:30 PM Medical Record Number:  [id number] Patient Account Number: [id number] Date of Birth/Sex: Treating RN: 04/19/1941 (82 y.o. Arta Silence Primary Care Deagen Krass: Hoyle Sauer Other Clinician: Referring Darreon Lutes: Treating Taneil Lazarus/Extender: Jodie Echevaria, Serafina Royals Weeks in Treatment: 13 Active Inactive Electronic Signature(s) Signed: 03/29/2023 4:56:09 PM By: Shawn Stall RN, BSN Entered By: Shawn Stall on 03/28/2023 12:50:19 -------------------------------------------------------------------------------- Pain Assessment Details Patient Name: Date of Service: SYMPHANY, FLEISSNER 03/28/2023 12:30 PM Medical Record Number: [id number] Patient Account Number: [id number] Date of Birth/Sex: Treating RN: [birth year] (82 y.o. Arta Silence Primary Care Shandi Godfrey: Hoyle Sauer Other Clinician: Referring Keymora Grillot: Treating Kealie Barrie/Extender: Vevelyn Royals Weeks in Treatment: 37 Active Problems Location of Pain Severity and Description of Pain Patient Has Paino No Site Locations Pain Management and Medication Current Pain Management: Electronic Signature(s) Signed: 03/29/2023 4:56:09 PM By: Shawn Stall RN, BSN Entered By: Shawn Stall on 03/28/2023 12:45:47 Fluegge, [name] ([id number]) [id number] Page 6 of 7 -------------------------------------------------------------------------------- Wound Assessment Details Patient Name: Date of Service: KRISTEENA, MEINEKE 03/28/2023 12:30 PM Medical Record Number: [id number] Patient Account Number: [id number] Date of Birth/Sex: Treating RN: 08-Apr-1941 (82 y.o. Arta Silence Primary Care Fardeen Steinberger: Hoyle Sauer Other Clinician: Referring Aimie Wagman: Treating Douglas Smolinsky/Extender: Jodie Echevaria, Ravisankar R Weeks in Treatment: 13 Wound Status Wound Number: 1 Primary Open Surgical Wound Etiology: Wound Location: Right Axilla Wound Open Wounding Event: Surgical Injury Status: Date  Acquired: 10/14/2022 Comorbid Sleep Apnea, Coronary Artery Disease, Myocardial Infarction, Type Weeks Of Treatment: 13 History: II Diabetes, Neuropathy, Received Chemotherapy, Received Clustered Wound: No Radiation Photos Wound Measurements Length: (cm) Width: (cm) Depth: (cm) Area: (cm) Volume: (cm) 0 % Reduction in Area: 100% 0 % Reduction in Volume: 100% 0 Epithelialization: Large (67-100%) 0 Tunneling: No 0 Undermining: No Wound Description Classification: Full Thickness With Exposed Suppor Wound Margin: Distinct, outline attached Exudate Amount: None Present t Structures Foul Odor After Cleansing: No Slough/Fibrino No Wound Bed Granulation Amount: None Present (0%) Exposed Structure Necrotic Amount: None Present (0%) Fascia Exposed: No Fat Layer (Subcutaneous Tissue) Exposed: Yes Tendon Exposed: No Muscle Exposed: No Joint Exposed: No Bone Exposed: No Periwound Skin Texture Texture Color No Abnormalities Noted: No No Abnormalities Noted: No Callus: No Atrophie Blanche: No Crepitus: No Cyanosis: No Excoriation: No Ecchymosis: No Induration: No Erythema: No Rash: No Hemosiderin Staining: No Scarring: No Mottled: No Pallor: No Moisture Rubor: No No Abnormalities Noted: No Dry / Scaly: No Maceration: No Halleck, Chelsy G (  [id number]) [id number] Page 7 of 7 Electronic Signature(s) Signed: 03/29/2023 4:56:09 PM By: Shawn Stall RN, BSN Entered By: Shawn Stall on 03/28/2023 12:47:30 -------------------------------------------------------------------------------- Vitals Details Patient Name: Date of Service: LARNA, CAPELLE 03/28/2023 12:30 PM Medical Record Number: [id number] Patient Account Number: [id number] Date of Birth/Sex: Treating RN: 23-Mar-1941 (82 y.o. Arta Silence Primary Care Kristan Brummitt: Hoyle Sauer Other Clinician: Referring Courtni Balash: Treating Lecil Tapp/Extender: Jodie Echevaria, Ravisankar R Weeks in  Treatment: 13 Vital Signs Time Taken: 12:45 Temperature (F): 98.9 Height (in): 67 Pulse (bpm): 88 Weight (lbs): 150 Respiratory Rate (breaths/min): 16 Body Mass Index (BMI): 23.5 Blood Pressure (mmHg): 150/78 Capillary Blood Glucose (mg/dl): 644 Reference Range: 80 - 120 mg / dl Electronic Signature(s) Signed: 03/29/2023 4:56:09 PM By: Shawn Stall RN, BSN Entered By: Shawn Stall on 03/28/2023 12:45:30

## 2023-03-29 NOTE — Patient Instructions (Signed)
[facility]  Discharge Instructions: Thank you for choosing [facility] to provide your oncology and hematology care.   If you have a lab appointment with the Cancer Center, please go directly to the Cancer Center and check in at the registration area.   Wear comfortable clothing and clothing appropriate for easy access to any Portacath or PICC line.   We strive to give you quality time with your provider. You may need to reschedule your appointment if you arrive late (15 or more minutes).  Arriving late affects you and other patients whose appointments are after yours.  Also, if you miss three or more appointments without notifying the office, you may be dismissed from the clinic at the provider's discretion.      For prescription refill requests, have your pharmacy contact our office and allow 72 hours for refills to be completed.    Today you received the following chemotherapy and/or immunotherapy agents cytoxan, methotrexate, adrucil    To help prevent nausea and vomiting after your treatment, we encourage you to take your nausea medication as directed.  BELOW ARE SYMPTOMS THAT SHOULD BE REPORTED IMMEDIATELY: *FEVER GREATER THAN 100.4 F (38 C) OR HIGHER *CHILLS OR SWEATING *NAUSEA AND VOMITING THAT IS NOT CONTROLLED WITH YOUR NAUSEA MEDICATION *UNUSUAL SHORTNESS OF BREATH *UNUSUAL BRUISING OR BLEEDING *URINARY PROBLEMS (pain or burning when urinating, or frequent urination) *BOWEL PROBLEMS (unusual diarrhea, constipation, pain near the anus) TENDERNESS IN MOUTH AND THROAT WITH OR WITHOUT PRESENCE OF ULCERS (sore throat, sores in mouth, or a toothache) UNUSUAL RASH, SWELLING OR PAIN  UNUSUAL VAGINAL DISCHARGE OR ITCHING   Items with * indicate a potential emergency and should be followed up as soon as possible or go to the Emergency Department if any problems should occur.  Please show the CHEMOTHERAPY ALERT CARD or IMMUNOTHERAPY  ALERT CARD at check-in to the Emergency Department and triage nurse.  Should you have questions after your visit or need to cancel or reschedule your appointment, please contact [facility]  Dept: [phone]  and follow the prompts.  Office hours are 8:00 a.m. to 4:30 p.m. Monday - Friday. Please note that voicemails left after 4:00 p.m. may not be returned until the following business day.  We are closed weekends and major holidays. You have access to a nurse at all times for urgent questions. Please call the main number to the clinic Dept: [phone] and follow the prompts.   For any non-urgent questions, you may also contact your provider using MyChart. We now offer e-Visits for anyone 73 and older to request care online for non-urgent symptoms. For details visit [email or web address].   Also download the MyChart app! Go to the app store, search "MyChart", open the app, select [facility], and log in with your MyChart username and password.

## 2023-04-04 DIAGNOSIS — I10 Essential (primary) hypertension: Secondary | ICD-10-CM | POA: Diagnosis not present

## 2023-04-04 DIAGNOSIS — E1149 Type 2 diabetes mellitus with other diabetic neurological complication: Secondary | ICD-10-CM | POA: Diagnosis not present

## 2023-04-04 DIAGNOSIS — M25611 Stiffness of right shoulder, not elsewhere classified: Secondary | ICD-10-CM | POA: Diagnosis not present

## 2023-04-04 DIAGNOSIS — Z1212 Encounter for screening for malignant neoplasm of rectum: Secondary | ICD-10-CM | POA: Diagnosis not present

## 2023-04-04 DIAGNOSIS — C50411 Malignant neoplasm of upper-outer quadrant of right female breast: Secondary | ICD-10-CM | POA: Diagnosis not present

## 2023-04-04 DIAGNOSIS — Z170 Estrogen receptor positive status [ER+]: Secondary | ICD-10-CM | POA: Diagnosis not present

## 2023-04-04 DIAGNOSIS — E785 Hyperlipidemia, unspecified: Secondary | ICD-10-CM | POA: Diagnosis not present

## 2023-04-05 ENCOUNTER — Encounter: Payer: Self-pay | Admitting: *Deleted

## 2023-04-14 DIAGNOSIS — K148 Other diseases of tongue: Secondary | ICD-10-CM | POA: Diagnosis not present

## 2023-04-14 DIAGNOSIS — R82998 Other abnormal findings in urine: Secondary | ICD-10-CM | POA: Diagnosis not present

## 2023-04-14 DIAGNOSIS — F39 Unspecified mood [affective] disorder: Secondary | ICD-10-CM | POA: Diagnosis not present

## 2023-04-14 DIAGNOSIS — G629 Polyneuropathy, unspecified: Secondary | ICD-10-CM | POA: Diagnosis not present

## 2023-04-14 DIAGNOSIS — N3281 Overactive bladder: Secondary | ICD-10-CM | POA: Diagnosis not present

## 2023-04-14 DIAGNOSIS — I10 Essential (primary) hypertension: Secondary | ICD-10-CM | POA: Diagnosis not present

## 2023-04-14 DIAGNOSIS — Z23 Encounter for immunization: Secondary | ICD-10-CM | POA: Diagnosis not present

## 2023-04-14 DIAGNOSIS — E785 Hyperlipidemia, unspecified: Secondary | ICD-10-CM | POA: Diagnosis not present

## 2023-04-14 DIAGNOSIS — C50411 Malignant neoplasm of upper-outer quadrant of right female breast: Secondary | ICD-10-CM | POA: Diagnosis not present

## 2023-04-14 DIAGNOSIS — Z1339 Encounter for screening examination for other mental health and behavioral disorders: Secondary | ICD-10-CM | POA: Diagnosis not present

## 2023-04-14 DIAGNOSIS — Z1331 Encounter for screening for depression: Secondary | ICD-10-CM | POA: Diagnosis not present

## 2023-04-14 DIAGNOSIS — E1149 Type 2 diabetes mellitus with other diabetic neurological complication: Secondary | ICD-10-CM | POA: Diagnosis not present

## 2023-04-14 DIAGNOSIS — I2510 Atherosclerotic heart disease of native coronary artery without angina pectoris: Secondary | ICD-10-CM | POA: Diagnosis not present

## 2023-04-14 DIAGNOSIS — Z0000 Encounter for general adult medical examination without abnormal findings: Secondary | ICD-10-CM | POA: Diagnosis not present

## 2023-04-18 ENCOUNTER — Other Ambulatory Visit: Payer: Self-pay

## 2023-04-18 ENCOUNTER — Other Ambulatory Visit: Payer: Self-pay | Admitting: Hematology and Oncology

## 2023-04-18 DIAGNOSIS — Z170 Estrogen receptor positive status [ER+]: Secondary | ICD-10-CM

## 2023-04-18 MED FILL — Dexamethasone Sodium Phosphate Inj 100 MG/10ML: INTRAMUSCULAR | Qty: 1 | Status: AC

## 2023-04-18 MED FILL — Fosaprepitant Dimeglumine For IV Infusion 150 MG (Base Eq): INTRAVENOUS | Qty: 5 | Status: AC

## 2023-04-19 ENCOUNTER — Telehealth: Payer: Self-pay

## 2023-04-19 ENCOUNTER — Inpatient Hospital Stay: Payer: Medicare Other | Attending: Hematology and Oncology

## 2023-04-19 ENCOUNTER — Other Ambulatory Visit: Payer: Self-pay

## 2023-04-19 ENCOUNTER — Inpatient Hospital Stay: Payer: Medicare Other

## 2023-04-19 ENCOUNTER — Inpatient Hospital Stay (HOSPITAL_BASED_OUTPATIENT_CLINIC_OR_DEPARTMENT_OTHER): Payer: Medicare Other | Admitting: Hematology and Oncology

## 2023-04-19 VITALS — BP 149/61 | HR 90 | Temp 97.9°F | Resp 17 | Wt 150.1 lb

## 2023-04-19 DIAGNOSIS — Z7982 Long term (current) use of aspirin: Secondary | ICD-10-CM | POA: Insufficient documentation

## 2023-04-19 DIAGNOSIS — Z79624 Long term (current) use of inhibitors of nucleotide synthesis: Secondary | ICD-10-CM | POA: Diagnosis not present

## 2023-04-19 DIAGNOSIS — Z9011 Acquired absence of right breast and nipple: Secondary | ICD-10-CM | POA: Diagnosis not present

## 2023-04-19 DIAGNOSIS — Z170 Estrogen receptor positive status [ER+]: Secondary | ICD-10-CM | POA: Insufficient documentation

## 2023-04-19 DIAGNOSIS — Z79899 Other long term (current) drug therapy: Secondary | ICD-10-CM | POA: Diagnosis not present

## 2023-04-19 DIAGNOSIS — Z95828 Presence of other vascular implants and grafts: Secondary | ICD-10-CM

## 2023-04-19 DIAGNOSIS — C50411 Malignant neoplasm of upper-outer quadrant of right female breast: Secondary | ICD-10-CM | POA: Diagnosis not present

## 2023-04-19 LAB — CMP (CANCER CENTER ONLY)
ALT: 15 U/L (ref 0–44)
AST: 17 U/L (ref 15–41)
Albumin: 4.5 g/dL (ref 3.5–5.0)
Alkaline Phosphatase: 46 U/L (ref 38–126)
Anion gap: 10 (ref 5–15)
BUN: 21 mg/dL (ref 8–23)
CO2: 23 mmol/L (ref 22–32)
Calcium: 9.5 mg/dL (ref 8.9–10.3)
Chloride: 105 mmol/L (ref 98–111)
Creatinine: 0.9 mg/dL (ref 0.44–1.00)
GFR, Estimated: >60 mL/min (ref >60)
Glucose, Bld: 172 mg/dL — ABNORMAL HIGH (ref 70–99)
Potassium: 4.2 mmol/L (ref 3.5–5.1)
Sodium: 138 mmol/L (ref 135–145)
Total Bilirubin: 0.3 mg/dL (ref 0.3–1.2)
Total Protein: 7.6 g/dL (ref 6.5–8.1)

## 2023-04-19 LAB — CBC WITH DIFFERENTIAL (CANCER CENTER ONLY)
Abs Immature Granulocytes: 0.11 10*3/uL — ABNORMAL HIGH (ref 0.00–0.07)
Basophils Absolute: 0.1 10*3/uL (ref 0.0–0.1)
Basophils Relative: 1 %
Eosinophils Absolute: 0.3 10*3/uL (ref 0.0–0.5)
Eosinophils Relative: 5 %
HCT: 36.4 % (ref 36.0–46.0)
Hemoglobin: 12.4 g/dL (ref 12.0–15.0)
Immature Granulocytes: 2 %
Lymphocytes Relative: 19 %
Lymphs Abs: 1.1 10*3/uL (ref 0.7–4.0)
MCH: 30.7 pg (ref 26.0–34.0)
MCHC: 34.1 g/dL (ref 30.0–36.0)
MCV: 90.1 fL (ref 80.0–100.0)
Monocytes Absolute: 0.9 10*3/uL (ref 0.1–1.0)
Monocytes Relative: 16 %
Neutro Abs: 3.5 10*3/uL (ref 1.7–7.7)
Neutrophils Relative %: 57 %
Platelet Count: 236 10*3/uL (ref 150–400)
RBC: 4.04 MIL/uL (ref 3.87–5.11)
RDW: 14 % (ref 11.5–15.5)
WBC Count: 6 10*3/uL (ref 4.0–10.5)
nRBC: 0 % (ref 0.0–0.2)

## 2023-04-19 MED ORDER — SODIUM CHLORIDE 0.9% FLUSH
10.0000 mL | Freq: Once | INTRAVENOUS | Status: AC
  Administered 2023-04-19: 10 mL

## 2023-04-19 MED ORDER — SODIUM CHLORIDE 0.9% FLUSH
10.0000 mL | Freq: Once | INTRAVENOUS | Status: AC
  Administered 2023-04-19: 10 mL via INTRAVENOUS

## 2023-04-19 MED ORDER — ALTEPLASE 2 MG IJ SOLR
2.0000 mg | Freq: Once | INTRAMUSCULAR | Status: AC
  Administered 2023-04-19: 2 mg
  Filled 2023-04-19: qty 2

## 2023-04-19 MED ORDER — LIDOCAINE VISCOUS HCL 2 % MT SOLN: 5.0000 mL | Freq: Four times a day (QID) | OROMUCOSAL | 1 refills | Status: DC | PRN

## 2023-04-19 MED ORDER — HEPARIN SOD (PORK) LOCK FLUSH 100 UNIT/ML IV SOLN
500.0000 [IU] | Freq: Once | INTRAVENOUS | Status: AC
  Administered 2023-04-19: 500 [IU] via INTRAVENOUS

## 2023-04-19 NOTE — Telephone Encounter (Signed)
Attempted to call pt. Per conversation with Lyla Son, RN, blood return was noted upon deaccess of PAC. Pt reported needle "just didn't feel like it usually does" Per MD, obtain CXR to check patency of line attached to [facility] to see if this is the reason we are having trouble with blood return. LVM for pt to call back so we can obtain CXR.

## 2023-04-19 NOTE — Progress Notes (Signed)
Pt reported to infusion today for tx. PAC accessed in flush room and no blood return noted by [facility] LPN. Cathflo administered at 0955 by flush RN.  At 1030 this RN checked PAC, no blood return noted.  At 1055, this RN checked PAC again, no blood return noted. Pt informed this RN that she has previously had issues with her PAC giving blood return. Pt verbalized to this RN that she could start an PIV for premedications, but would prefer tx be given via PAC.This RN made Dr.Gudena aware and recommended a dye study be obtained. Dr.Gudena ordered dye study for Pt's PAC, and requested RN administer a second dose of cathflo. This RN notified Pt of MD's recommendations, Pt verbalized understanding and was agreeable. Second dose of cathflo administered at 1118.  At 1150 no blood return noted. This RN made MD aware. Per Dr.Gudena HOLD tx today, Pt will be reassessed and tx will be rescheduled once dye study obtained. Pt made aware and agreeable.  Pt's PAC de-accessed and Pt discharged to lobby via ambulation in stable condition without complaint.

## 2023-04-19 NOTE — Assessment & Plan Note (Addendum)
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 no role of antiestrogen therapy since her repeat prognostic panel was triple negative -------------------------------------------------------------------------------------------------------------------------------------------- Current treatment: Cycle 4 CMF  Chemo toxicities: Hospitalization 11/21/2022-11/26/2022: Cellulitis with neutropenic fever Fatigue: Persistent Mouth sores: Renewed Magic mouthwash.  Reduce the dosage of chemotherapy with cycle 3. Patient's wound has healed  She tolerated the lower dosage of chemo extremely well.   Return to clinic every 3 weeks for chemotherapy.

## 2023-04-19 NOTE — Progress Notes (Signed)
Patient Care Team: Chilton Greathouse, MD as PCP - General (Internal Medicine) Chilton Greathouse, MD as Consulting Physician (Internal Medicine) Serena Croissant, MD as Consulting Physician (Hematology and Oncology) Pershing Proud, RN as Oncology Nurse Navigator Donnelly Angelica, RN as Oncology Nurse Navigator  DIAGNOSIS:  Encounter Diagnosis  Name Primary?   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) 3.4 cm grade 3 with DCIS, lymphovascular invasion present, focal perineural invasion present, margins negative ER 50%, PR 0%, HER2 1+   11/12/2022 -  Chemotherapy   Patient is on Treatment Plan : BREAST Adjuvant CMF IV q21d       CHIEF COMPLIANT: Cmf cycle 4  INTERVAL HISTORY: Karina Foster is a 82 y.o. female is here because of recent diagnosis of right breast cancer. She presents to the clinic for a follow-up. Patient reports she tolerated treatment fairly well. She denies any side effects or symptoms. She was mostly fatigue and had some mouth sores. She denies any constipation. She did have some terrible stomach pain that has subsided.   ALLERGIES:  is allergic to azithromycin and demerol [meperidine].  MEDICATIONS:  Current Outpatient Medications  Medication Sig Dispense Refill   acetaminophen (TYLENOL) 500 MG tablet Take 2 tablets (1,000 mg total) by mouth every 6 (six) hours. (Patient taking differently: Take 1,000 mg by mouth 2 (two) times daily.) 30 tablet 0   aspirin 325 MG  tablet Take 325 mg by mouth daily.     BD AUTOSHIELD DUO 30G X 5 MM MISC      BD PEN NEEDLE NANO U/F 32G X 4 MM MISC SMARTSIG:2 Pen Needle SUB-Q Daily     Bempedoic Acid-Ezetimibe (NEXLIZET) 180-10 MG TABS Take 1 tablet by mouth daily. (Patient taking differently: Take 1 tablet by mouth every other day.) 90 tablet 3   Cholecalciferol (VITAMIN D3) 25 MCG (1000 UT) tablet Take 1,000 Units by mouth daily.     citalopram (CELEXA) 40 MG tablet Take 40 mg by mouth daily.     Continuous Blood Gluc Sensor (FREESTYLE LIBRE 14 DAY SENSOR) MISC Apply topically every 14 (fourteen) days.     Cranberry Soft 500 MG CHEW Chew 500 mg by mouth daily.     fluocinonide gel (LIDEX) 0.05 % Apply 1 Application topically daily as needed (irritation on tongue).     ibuprofen (ADVIL) 200 MG tablet Take 200 mg by mouth every 6 (six) hours as needed for moderate pain.     insulin lispro (HUMALOG) 100 UNIT/ML injection Inject 0.3 mLs (30 Units total) into the skin 3 (three) times daily before meals. 10 mL 11   lidocaine (XYLOCAINE) 2 % solution Use as directed 15 mLs in the mouth or throat every 6 (six) hours as needed for mouth pain.     lidocaine-prilocaine (EMLA) cream Apply to affected area once (Patient taking differently: Apply 1 Application topically as needed (port). Apply to affected area once) 30 g 3   metoprolol tartrate (LOPRESSOR) 25 MG tablet TAKE 1/2 TABLET TWICE DAILY 180 tablet 3  Multiple Vitamins-Minerals (PRESERVISION AREDS) CAPS Take 1 capsule by mouth in the morning and at bedtime.     nitroGLYCERIN (NITROSTAT) 0.4 MG SL tablet Place 0.4 mg under the tongue every 5 (five) minutes as needed for chest pain.     ramipril (ALTACE) 2.5 MG capsule Take 2.5 mg by mouth daily.     Sodium Chloride-Sodium Bicarb (SODIUM BICARBONATE/SODIUM CHLORIDE) SOLN Swish and spit as needed for mouth pain     tiZANidine (ZANAFLEX) 2 MG tablet Take 1 tablet (2 mg total) by mouth every 8 (eight) hours as needed for muscle  spasms. 20 tablet 1   TRESIBA FLEXTOUCH 200 UNIT/ML FlexTouch Pen Inject 32 Units into the skin daily after lunch. We've reduced your insulin dose, please follow up with your PCP outpatient and adjust this further as needed. (Patient taking differently: Inject 64 Units into the skin daily after lunch. We've reduced your insulin dose, please follow up with your PCP outpatient and adjust this further as needed.)     valACYclovir (VALTREX) 500 MG tablet Take 1 tablet (500 mg total) by mouth 2 (two) times daily. 60 tablet 0   vitamin B-12 (CYANOCOBALAMIN) 1000 MCG tablet Take 1,000 mcg by mouth daily.     magic mouthwash (lidocaine, diphenhydrAMINE, alum & mag hydroxide) suspension Swish and spit 5 mLs by mouth 4 (four) times daily as needed for mouth pain. 240 mL 1   No current facility-administered medications for this visit.    PHYSICAL EXAMINATION: ECOG PERFORMANCE STATUS: 1 - Symptomatic but completely ambulatory  Vitals:   04/19/23 1012  BP: (!) 149/61  Pulse: 90  Resp: 17  Temp: 97.9 F (36.6 C)  SpO2: 98%   Filed Weights   04/19/23 1012  Weight: 150 lb 1.6 oz (68.1 kg)      LABORATORY DATA:  I have reviewed the data as listed    Latest Ref Rng & Units 03/29/2023   11:19 AM 02/04/2023   10:11 AM 01/14/2023    9:57 AM  CMP  Glucose 70 - 99 mg/dL 409  811  914   BUN 8 - 23 mg/dL 21  19  17    Creatinine 0.44 - 1.00 mg/dL 7.82  9.56  2.13   Sodium 135 - 145 mmol/L 138  137  139   Potassium 3.5 - 5.1 mmol/L 5.2  4.4  3.8   Chloride 98 - 111 mmol/L 103  103  103   CO2 22 - 32 mmol/L 26  26  26    Calcium 8.9 - 10.3 mg/dL 9.9  9.5  9.5   Total Protein 6.5 - 8.1 g/dL 7.6  7.5  7.8   Total Bilirubin 0.3 - 1.2 mg/dL 0.3  0.4  0.7   Alkaline Phos 38 - 126 U/L 39  47  49   AST 15 - 41 U/L 19  16  15    ALT 0 - 44 U/L 21  18  16      Lab Results  Component Value Date   WBC 6.0 04/19/2023   HGB 12.4 04/19/2023   HCT 36.4 04/19/2023   MCV 90.1 04/19/2023   PLT 236 04/19/2023    NEUTROABS 3.5 04/19/2023    ASSESSMENT & PLAN:  Malignant neoplasm of upper-outer quadrant of right breast in female, estrogen receptor positive (HCC) 10/14/2022:Right mastectomy: High-grade IDC 3.4 cm with focal sarcomatoid features (metaplastic) 3.4 cm grade 3 with DCIS, lymphovascular invasion present, focal perineural invasion present, margins negative ER 0%, PR 0%, HER2 1+ (repeat prognostic panel  was triple negative)   Treatment plan: adjuvant chemotherapy with CMF x 6 cycles started 11/12/2022 (held for wound infection, resumed 03/29/2023) 2. no role of antiestrogen therapy since her repeat prognostic panel was triple negative -------------------------------------------------------------------------------------------------------------------------------------------- Current treatment: Cycle 4 CMF  Chemo toxicities: Hospitalization 11/21/2022-11/26/2022: Cellulitis with neutropenic fever Fatigue: Persistent Mouth sores: Renewed Magic mouthwash.  Reduce the dosage of chemotherapy with cycle 3. Patient's wound has healed  She tolerated the lower dosage of chemo extremely well.   Return to clinic every 3 weeks for chemotherapy.    No orders of the defined types were placed in this encounter.  The patient has a good understanding of the overall plan. she agrees with it. she will call with any problems that may develop before the next visit here. Total time spent: 30 mins including face to face time and time spent for planning, charting and co-ordination of care   Tamsen Meek, MD 04/19/23    I Janan Ridge am acting as a Neurosurgeon for [employer]  I have reviewed the above documentation for accuracy and completeness, and I agree with the above.

## 2023-04-19 NOTE — Progress Notes (Signed)
Order placed for Dye Study to [facility] per MD d/t difficulty with blood return. She is scheduled for 04/21/23 at 3 PM.

## 2023-04-21 ENCOUNTER — Ambulatory Visit (HOSPITAL_COMMUNITY)
Admission: RE | Admit: 2023-04-21 | Discharge: 2023-04-21 | Disposition: A | Discharge status: Home / Self Care | Payer: Medicare Other | Source: Ambulatory Visit | Attending: Hematology and Oncology | Admitting: Hematology and Oncology

## 2023-04-21 ENCOUNTER — Other Ambulatory Visit: Payer: Self-pay

## 2023-04-21 DIAGNOSIS — Z170 Estrogen receptor positive status [ER+]: Secondary | ICD-10-CM | POA: Diagnosis not present

## 2023-04-21 DIAGNOSIS — I10 Essential (primary) hypertension: Secondary | ICD-10-CM | POA: Diagnosis not present

## 2023-04-21 DIAGNOSIS — E1149 Type 2 diabetes mellitus with other diabetic neurological complication: Secondary | ICD-10-CM | POA: Diagnosis not present

## 2023-04-21 DIAGNOSIS — Z452 Encounter for adjustment and management of vascular access device: Secondary | ICD-10-CM | POA: Insufficient documentation

## 2023-04-21 DIAGNOSIS — I2510 Atherosclerotic heart disease of native coronary artery without angina pectoris: Secondary | ICD-10-CM | POA: Diagnosis not present

## 2023-04-21 DIAGNOSIS — C50411 Malignant neoplasm of upper-outer quadrant of right female breast: Secondary | ICD-10-CM | POA: Diagnosis not present

## 2023-04-21 DIAGNOSIS — Z794 Long term (current) use of insulin: Secondary | ICD-10-CM | POA: Diagnosis not present

## 2023-04-21 DIAGNOSIS — Z95828 Presence of other vascular implants and grafts: Secondary | ICD-10-CM

## 2023-04-21 HISTORY — PX: IR CV LINE INJECTION: IMG2294

## 2023-04-21 MED ORDER — IOHEXOL 300 MG/ML  SOLN
50.0000 mL | Freq: Once | INTRAMUSCULAR | Status: AC | PRN
  Administered 2023-04-21: 15 mL via INTRAVENOUS

## 2023-04-21 NOTE — Progress Notes (Signed)
Per Dr. Richarda Overlie via secure chat:  "Port check today. Her port is retracted and probably causing problems. Will need to be revised if you want to use it for a while. She asked if we could revise it rather than going back to surgery."  OK per Dr. Serena Croissant for revision before next treatment date of 05/10/23. Order for port revision placed. Cathy with IR reports she will be in contact with Pt to schedule.

## 2023-04-28 ENCOUNTER — Encounter: Payer: Self-pay | Admitting: Hematology and Oncology

## 2023-05-02 ENCOUNTER — Other Ambulatory Visit: Payer: Self-pay | Admitting: Student

## 2023-05-02 DIAGNOSIS — E1149 Type 2 diabetes mellitus with other diabetic neurological complication: Secondary | ICD-10-CM

## 2023-05-03 ENCOUNTER — Encounter (HOSPITAL_COMMUNITY): Payer: Self-pay

## 2023-05-03 ENCOUNTER — Ambulatory Visit (HOSPITAL_COMMUNITY): Admission: RE | Admit: 2023-05-03 | Payer: Medicare Other | Source: Ambulatory Visit

## 2023-05-03 ENCOUNTER — Ambulatory Visit (HOSPITAL_COMMUNITY)
Admission: RE | Admit: 2023-05-03 | Discharge: 2023-05-03 | Disposition: A | Discharge status: Home / Self Care | Payer: Medicare Other | Source: Ambulatory Visit | Attending: Hematology and Oncology | Admitting: Hematology and Oncology

## 2023-05-03 ENCOUNTER — Other Ambulatory Visit (HOSPITAL_COMMUNITY): Payer: Self-pay | Admitting: Student

## 2023-05-03 DIAGNOSIS — E1149 Type 2 diabetes mellitus with other diabetic neurological complication: Secondary | ICD-10-CM

## 2023-05-03 DIAGNOSIS — C50411 Malignant neoplasm of upper-outer quadrant of right female breast: Secondary | ICD-10-CM

## 2023-05-03 LAB — GLUCOSE, CAPILLARY: Glucose-Capillary: 204 mg/dL — ABNORMAL HIGH (ref 70–99)

## 2023-05-03 MED ORDER — SODIUM CHLORIDE 0.9 % IV SOLN: INTRAVENOUS | Status: DC

## 2023-05-03 NOTE — H&P (Shared)
Chief Complaint: Patient was seen in consultation today for port-a-catheter exchange  Referring Physician(s): Gudena,Vinay  Supervising Physician: Irish Lack  Patient Status: [facility] - Out-pt  History of Present Illness: Karina Foster is an 82 y.o. female with a medical history significant for CAD, MI, depression, DM and right breast cancer. She is now s/p left subclavian port placement and right mastectomy with Surgery 10/14/22. She was recently referred to IR for a port-a-catheter assessment due to the port not aspirating well. Injection under fluoroscopy 04/21/23 demonstrated catheter tip retraction with the tip against the wall of the SVC. It flushed and aspirated well during that visit but IR recommended port revision due to the retracted catheter tip.   The patient presents today for left subclavian port removal with placement of a new right IJ port a catheter.   Past Medical History:  Diagnosis Date   Allergic rhinitis, seasonal    Arthritis    hands   Asthma    Years ago due to a cat. 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    GERD (gastroesophageal reflux disease)    History of benign neoplasm of tongue    per pt has had several bx's of lesions   History of cancer chemotherapy 1997   right breast cancer   History of external beam radiation therapy 1997   right breast cancer   History of iron deficiency anemia    History of non-ST elevation  myocardial infarction (NSTEMI) 12/14/2008   History of right breast cancer 1997   s/p  right partial masectomy node dissection's,  completed chemo and radiation same year ,   no recurrence since   Hyperlipidemia    Mood disorder (HCC)    Myocardial infarction (HCC) 2010   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   OAB (overactive bladder)    OSA (obstructive sleep apnea) 2016   sleep study in epic 10-04-2014, AHI 22.7/ hr;  (06-17-2022  pt stated has not used dental appliance in few years, does not fit )   Peripheral neuropathy    feet   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/2008   x2  to proximal and mid LAD   Urinary incontinence, mixed    urologist--- dr pace   Wears glasses    Wears hearing aid in both ears     Past Surgical History:  Procedure Laterality Date   BOTOX INJECTION N/A 06/22/2022   Procedure: ABORTED BOTOX INJECTION 75 UNITS;  Surgeon: Noel Christmas, MD;  Location: [facility];  Service: Urology;  Laterality: N/A;   BOTOX INJECTION N/A 07/20/2022   Procedure: BOTOX INJECTION;  Surgeon: Noel Christmas, MD;  Location: [facility];  Service: Urology;  Laterality: N/A;   BREAST BIOPSY Right 08/27/2022   [place] RT BREAST BX W LOC DEV 1ST LESION IMG BX SPEC US  GUIDE 08/27/2022 [facility] MAMMOGRAPHY   CATARACT EXTRACTION W/ INTRAOCULAR LENS IMPLANT Bilateral 2014   CORONARY ANGIOPLASTY WITH STENT PLACEMENT  12/16/2008   @[facility]   by dr berry;   PCI and stenting to proximal and mid LAD (DES) w/ residual D1 80%  nonobstructive disease involving RCA/ CFx,  ef 50%   CYSTOSCOPY N/A 06/22/2022   Procedure: CYSTOSCOPY, BLADDER BIOPSY, AND FULGERATION;  Surgeon: Noel Christmas, MD;  Location: [facility];  Service: Urology;  Laterality: N/A;  30 MINS   CYSTOSCOPY N/A 07/20/2022   Procedure: CYSTOSCOPY, BLADDER FULGERATION;  Surgeon: Noel Christmas, MD;  Location: [facility];  Service: Urology;   Laterality: N/A;  30 MINS   EXCISION OF BREAST BIOPSY Right 1995   IR CV LINE INJECTION  04/21/2023   MASTECTOMY PARTIAL / LUMPECTOMY W/ AXILLARY LYMPHADENECTOMY Right 1997   PORTACATH PLACEMENT N/A 10/14/2022   Procedure: PORT PLACEMENT;  Surgeon: Almond Lint, MD;  Location: [facility] OR;  Service: General;  Laterality: N/A;   TOTAL MASTECTOMY Right 10/14/2022   Procedure: RIGHT MASTECTOMY;  Surgeon: Almond Lint, MD;  Location: [facility] OR;  Service: General;  Laterality: Right;  PEC BLOCK   TUBAL LIGATION Bilateral 1977    Allergies: Azithromycin and Demerol [meperidine]  Medications: Prior to Admission medications   Medication Sig Start Date End Date Taking? Authorizing Provider  acetaminophen (TYLENOL) 500 MG tablet Take 2 tablets (1,000 mg total) by mouth every 6 (six) hours. Patient taking differently: Take 1,000 mg by mouth 2 (two) times daily. 10/15/22   Almond Lint, MD  aspirin 325 MG tablet Take 325 mg by mouth daily.    [provider]  BD AUTOSHIELD DUO 30G X 5 MM MISC  11/28/22   [provider]  BD PEN NEEDLE NANO U/F 32G X 4 MM MISC SMARTSIG:2 Pen Needle SUB-Q Daily    [provider]  Bempedoic Acid-Ezetimibe (NEXLIZET) 180-10 MG TABS Take 1 tablet by mouth daily. Patient taking differently: Take 1 tablet by mouth every other day. 10/24/19   Yates Decamp, MD  Cholecalciferol (VITAMIN D3) 25 MCG (1000 UT) tablet Take 1,000 Units by mouth daily.    [provider]  citalopram (CELEXA) 40 MG tablet Take 40 mg by mouth daily.    [provider]  Continuous Blood Gluc Sensor (FREESTYLE LIBRE 14 DAY SENSOR) MISC Apply topically every 14 (fourteen) days. 09/12/22   [provider]  Cranberry Soft 500 MG CHEW Chew 500 mg by mouth daily.    [provider]  fluocinonide gel (LIDEX) 0.05 % Apply 1 Application topically daily as needed (irritation on tongue).    [provider]  ibuprofen (ADVIL) 200 MG tablet Take 200 mg by mouth  every 6 (six) hours as needed for moderate pain.    [provider]  insulin lispro (HUMALOG) 100 UNIT/ML injection Inject 0.3 mLs (30 Units total) into the skin 3 (three) times daily before meals. 01/14/23   Serena Croissant, MD  lidocaine (XYLOCAINE) 2 % solution Use as directed 15 mLs in the mouth or throat every 6 (six) hours as needed for mouth pain. 11/19/22   [provider]  lidocaine-prilocaine (EMLA) cream Apply to affected area once Patient taking differently: Apply 1 Application topically as needed (port). Apply to affected area once 10/25/22   Serena Croissant, MD  magic mouthwash (lidocaine, diphenhydrAMINE, alum & mag hydroxide) suspension Swish and spit 5 mLs by mouth 4 (four) times daily as needed for mouth pain. 04/19/23  Serena Croissant, MD  metoprolol tartrate (LOPRESSOR) 25 MG tablet TAKE 1/2 TABLET TWICE DAILY 03/29/22   Yates Decamp, MD  Multiple Vitamins-Minerals (PRESERVISION AREDS) CAPS Take 1 capsule by mouth in the morning and at bedtime.    [provider]  nitroGLYCERIN (NITROSTAT) 0.4 MG SL tablet Place 0.4 mg under the tongue every 5 (five) minutes as needed for chest pain.    [provider]  ramipril (ALTACE) 2.5 MG capsule Take 2.5 mg by mouth daily.    [provider]  Sodium Chloride-Sodium Bicarb (SODIUM BICARBONATE/SODIUM CHLORIDE) SOLN Swish and spit as needed for mouth pain 11/26/22   Zigmund Daniel., MD  tiZANidine (ZANAFLEX) 2 MG tablet Take 1 tablet (2 mg total) by mouth every 8 (eight) hours as needed for muscle spasms. 10/15/22   Almond Lint, MD  TRESIBA FLEXTOUCH 200 UNIT/ML FlexTouch Pen Inject 32 Units into the skin daily after lunch. We've reduced your insulin dose, please follow up with your PCP outpatient and adjust this further as needed. Patient taking differently: Inject 64 Units into the skin daily after lunch. We've reduced your insulin dose, please follow up with your PCP outpatient and adjust this further as  needed. 11/26/22   Zigmund Daniel., MD  valACYclovir (VALTREX) 500 MG tablet Take 1 tablet (500 mg total) by mouth 2 (two) times daily. 11/19/22   Loa Socks, NP  vitamin B-12 (CYANOCOBALAMIN) 1000 MCG tablet Take 1,000 mcg by mouth daily.    [provider]     Family History  Problem Relation Age of Onset   Osteoporosis Mother    Breast cancer Mother 97   Heart Problems Mother    Other Father        MI   CVA Father    Asthma Father    Diabetes Mellitus II Father    Hypertension Sister    Ovarian cancer Sister 42    Social History   Socioeconomic History   Marital status: Married    Spouse name: Not on file   Number of children: 1   Years of education: Assoc    Highest education level: Not on file  Occupational History    Comment: retired  Tobacco Use   Smoking status: Former    Current packs/day: 0.00    Average packs/day: 1 pack/day for 20.0 years (20.0 ttl pk-yrs)    Types: Cigarettes    Start date: 83    Quit date: 1991    Years since quitting: 33.6   Smokeless tobacco: Never  Vaping Use   Vaping status: Never Used  Substance and Sexual Activity   Alcohol use: Yes    Alcohol/week: 7.0 standard drinks of alcohol    Types: 7 Glasses of wine per week    Comment: Wine prior to dinner   Drug use: Never   Sexual activity: Not on file  Other Topics Concern   Not on file  Social History Narrative   3 caffeine drinks a day    Social Determinants of Health   Financial Resource Strain: Not on file  Food Insecurity: No Food Insecurity (11/22/2022)   Hunger Vital Sign    Worried About Running Out of Food in the Last Year: Never true    Ran Out of Food in the Last Year: Never true  Transportation Needs: No Transportation Needs (11/22/2022)   PRAPARE - Administrator, Civil Service (Medical): No    Lack of Transportation (Non-Medical): No  Physical Activity:  Not on file  Stress: Not on file  Social Connections: Not on file     Review of Systems: A 12 point ROS discussed and pertinent positives are indicated in the HPI above.  All other systems are negative.  Review of Systems  Constitutional:  Negative for appetite change and fatigue.  Respiratory:  Negative for cough and shortness of breath.   Cardiovascular:  Negative for chest pain and leg swelling.  Gastrointestinal:  Negative for abdominal pain, diarrhea, nausea and vomiting.  Neurological:  Negative for dizziness and headaches.    Vital Signs: There were no vitals taken for this visit.  Physical Exam Constitutional:      General: She is not in acute distress.    Appearance: She is not ill-appearing.  HENT:     Mouth/Throat:     Mouth: Mucous membranes are moist.     Pharynx: Oropharynx is clear.  Cardiovascular:     Rate and Rhythm: Normal rate and regular rhythm.     Pulses: Normal pulses.     Heart sounds: Normal heart sounds.     Comments: Left subclavian port.  Pulmonary:     Effort: Pulmonary effort is normal.     Breath sounds: Normal breath sounds.  Abdominal:     General: Bowel sounds are normal.     Palpations: Abdomen is soft.     Tenderness: There is no abdominal tenderness.  Musculoskeletal:     Comments: Right mastectomy  Skin:    General: Skin is warm and dry.  Neurological:     Mental Status: She is alert and oriented to person, place, and time.  Psychiatric:        Mood and Affect: Mood normal.        Behavior: Behavior normal.        Thought Content: Thought content normal.        Judgment: Judgment normal.     Imaging: IR CV Line Injection  Result Date: 04/21/2023 INDICATION: 82 year old with history of breast cancer and left chest Port-A-Cath. Port-A-Cath is not aspirating well. EXAM: PORT INJECTION WITH FLUOROSCOPY MEDICATIONS: None ANESTHESIA/SEDATION: None FLUOROSCOPY: Radiation Exposure Index (as provided by the fluoroscopic device): 15 mGy Kerma COMPLICATIONS: None immediate. PROCEDURE: The port was  accessed by the radiology nurse at the beginning of the procedure. The port aspirated today. Port flushed without difficulty. Port-A-Cath was injected with contrast under fluoroscopy. Port was flushed with heparinized saline and the access needle was removed. Bandage was placed at the needle site. FINDINGS: Left subclavian Port-A-Cath with the tip near the junction of the left innominate vein and SVC. Catheter tip appears to be against the wall of the SVC. Port-A-Cath appears stable in position compared to a chest radiograph on 11/22/2022. However the port has retracted since the original placement on 10/14/2022. No evidence for port discontinuity or fracture. No significant fibrin sheath identified. IMPRESSION: Port-A-Cath tip has retracted since original placement on 10/14/2022. The catheter tip is against the wall of the SVC and this is likely the reason for intermittent problems. The Port-A-Cath was aspirating and flushing well at today's visit. Consider port revision due to the retracted catheter tip. Electronically Signed   By: Richarda Overlie M.D.   On: 04/21/2023 16:41    Labs:  CBC: Recent Labs    01/14/23 0957 02/04/23 1011 03/29/23 1119 04/19/23 0959  WBC 6.3 5.2 6.3 6.0  HGB 11.9* 11.9* 12.7 12.4  HCT 34.7* 35.9* 36.9 36.4  PLT 269 264 257 236  COAGS: Recent Labs    11/22/22 0058  INR 1.2    BMP: Recent Labs    01/14/23 0957 02/04/23 1011 03/29/23 1119 04/19/23 0959  NA 139 137 138 138  K 3.8 4.4 5.2* 4.2  CL 103 103 103 105  CO2 26 26 26 23   GLUCOSE 174* 220* 158* 172*  BUN 17 19 21 21   CALCIUM 9.5 9.5 9.9 9.5  CREATININE 0.80 0.84 1.14* 0.90  GFRNONAA >60 >60 49* >60    LIVER FUNCTION TESTS: Recent Labs    01/14/23 0957 02/04/23 1011 03/29/23 1119 04/19/23 0959  BILITOT 0.7 0.4 0.3 0.3  AST 15 16 19 17   ALT 16 18 21 15   ALKPHOS 49 47 39 46  PROT 7.8 7.5 7.6 7.6  ALBUMIN 4.4 4.4 4.7 4.5    TUMOR MARKERS: No results for input(s): "AFPTM", "CEA",  "CA199", "CHROMGRNA" in the last 8760 hours.  Assessment and Plan:  Right breast cancer with left subclavian surgically placed port-a-catheter; catheter tip retracted: [name]. [place], 82 year old female, presents today to the [facility] Interventional Radiology department for removal of the left subclavian port and placement of a new right IJ port-a-catheter.   Risks and benefits of image-guided Port-a-catheter placement were discussed with the patient including, but not limited to bleeding, infection, pneumothorax, or fibrin sheath development and need for additional procedures.  All of the patient's questions were answered, patient is agreeable to proceed.  Consent signed and in chart.  Thank you for this interesting consult.  I greatly enjoyed meeting ARLIS FITZMAURICE and look forward to participating in their care.  A copy of this report was sent to the requesting provider on this date.  Electronically Signed: Alwyn Ren, AGACNP-BC [phone] 05/03/2023, 1:55 PM   I spent a total of  30 Minutes   in face to face in clinical consultation, greater than 50% of which was counseling/coordinating care for port-a-catheter exchange.

## 2023-05-04 ENCOUNTER — Other Ambulatory Visit: Payer: Self-pay

## 2023-05-04 ENCOUNTER — Ambulatory Visit (HOSPITAL_COMMUNITY)
Admission: RE | Admit: 2023-05-04 | Discharge: 2023-05-04 | Disposition: A | Discharge status: Home / Self Care | Payer: Medicare Other | Source: Ambulatory Visit

## 2023-05-04 ENCOUNTER — Other Ambulatory Visit: Payer: Self-pay | Admitting: Hematology and Oncology

## 2023-05-04 ENCOUNTER — Ambulatory Visit (HOSPITAL_COMMUNITY)
Admission: RE | Admit: 2023-05-04 | Discharge: 2023-05-04 | Disposition: A | Discharge status: Home / Self Care | Payer: Medicare Other | Source: Ambulatory Visit | Attending: Hematology and Oncology | Admitting: Hematology and Oncology

## 2023-05-04 ENCOUNTER — Encounter (HOSPITAL_COMMUNITY): Payer: Self-pay

## 2023-05-04 DIAGNOSIS — Z794 Long term (current) use of insulin: Secondary | ICD-10-CM | POA: Diagnosis not present

## 2023-05-04 DIAGNOSIS — Y839 Surgical procedure, unspecified as the cause of abnormal reaction of the patient, or of later complication, without mention of misadventure at the time of the procedure: Secondary | ICD-10-CM | POA: Diagnosis not present

## 2023-05-04 DIAGNOSIS — C50919 Malignant neoplasm of unspecified site of unspecified female breast: Secondary | ICD-10-CM | POA: Diagnosis not present

## 2023-05-04 DIAGNOSIS — C50411 Malignant neoplasm of upper-outer quadrant of right female breast: Secondary | ICD-10-CM

## 2023-05-04 DIAGNOSIS — T82524A Displacement of infusion catheter, initial encounter: Secondary | ICD-10-CM | POA: Insufficient documentation

## 2023-05-04 DIAGNOSIS — F32A Depression, unspecified: Secondary | ICD-10-CM | POA: Insufficient documentation

## 2023-05-04 DIAGNOSIS — Z170 Estrogen receptor positive status [ER+]: Secondary | ICD-10-CM | POA: Insufficient documentation

## 2023-05-04 DIAGNOSIS — Z87891 Personal history of nicotine dependence: Secondary | ICD-10-CM | POA: Diagnosis not present

## 2023-05-04 DIAGNOSIS — I252 Old myocardial infarction: Secondary | ICD-10-CM | POA: Insufficient documentation

## 2023-05-04 DIAGNOSIS — E1142 Type 2 diabetes mellitus with diabetic polyneuropathy: Secondary | ICD-10-CM | POA: Diagnosis not present

## 2023-05-04 DIAGNOSIS — I2510 Atherosclerotic heart disease of native coronary artery without angina pectoris: Secondary | ICD-10-CM | POA: Diagnosis not present

## 2023-05-04 DIAGNOSIS — T82528A Displacement of other cardiac and vascular devices and implants, initial encounter: Secondary | ICD-10-CM | POA: Diagnosis not present

## 2023-05-04 HISTORY — PX: IR REMOVAL TUN ACCESS W/ PORT W/O FL MOD SED: IMG2290

## 2023-05-04 HISTORY — PX: IR IMAGING GUIDED PORT INSERTION: IMG5740

## 2023-05-04 LAB — GLUCOSE, CAPILLARY: Glucose-Capillary: 124 mg/dL — ABNORMAL HIGH (ref 70–99)

## 2023-05-04 MED ORDER — FENTANYL CITRATE (PF) 100 MCG/2ML IJ SOLN
INTRAMUSCULAR | Status: AC
  Filled 2023-05-04: qty 2

## 2023-05-04 MED ORDER — LIDOCAINE-EPINEPHRINE 1 %-1:100000 IJ SOLN
20.0000 mL | Freq: Once | INTRAMUSCULAR | Status: AC
  Administered 2023-05-04: 13 mL via INTRADERMAL

## 2023-05-04 MED ORDER — SODIUM CHLORIDE 0.9 % IV SOLN: INTRAVENOUS | Status: DC

## 2023-05-04 MED ORDER — HEPARIN SOD (PORK) LOCK FLUSH 100 UNIT/ML IV SOLN
INTRAVENOUS | Status: AC
  Filled 2023-05-04: qty 5

## 2023-05-04 MED ORDER — FENTANYL CITRATE (PF) 100 MCG/2ML IJ SOLN
INTRAMUSCULAR | Status: AC | PRN
  Administered 2023-05-04 (×5): 25 ug via INTRAVENOUS

## 2023-05-04 MED ORDER — MIDAZOLAM HCL 2 MG/2ML IJ SOLN
INTRAMUSCULAR | Status: AC
  Filled 2023-05-04: qty 2

## 2023-05-04 MED ORDER — LIDOCAINE-EPINEPHRINE 1 %-1:100000 IJ SOLN
20.0000 mL | Freq: Once | INTRAMUSCULAR | Status: AC
  Administered 2023-05-04: 10 mL via INTRADERMAL

## 2023-05-04 MED ORDER — LIDOCAINE-EPINEPHRINE 1 %-1:100000 IJ SOLN
INTRAMUSCULAR | Status: AC
  Filled 2023-05-04: qty 2

## 2023-05-04 MED ORDER — HEPARIN SOD (PORK) LOCK FLUSH 100 UNIT/ML IV SOLN
500.0000 [IU] | Freq: Once | INTRAVENOUS | Status: AC
  Administered 2023-05-04: 500 [IU] via INTRAVENOUS

## 2023-05-04 MED ORDER — MIDAZOLAM HCL 2 MG/2ML IJ SOLN
INTRAMUSCULAR | Status: AC | PRN
  Administered 2023-05-04 (×5): .5 mg via INTRAVENOUS

## 2023-05-04 NOTE — H&P (Signed)
Chief Complaint: Patient was seen in consultation today for malpositioned left subclavian port.  Referring Physician(s): HQIONG,EXBMW  Supervising Physician: Ruel Favors  Patient Status: [facility] - Out-pt  History of Present Illness: Karina Foster is a 82 y.o. female with a medical history significant for CAD, MI, depression, DM and right breast cancer s/p mastectomy who presents today for port removal and placement of a new port. Karina Foster underwent left subclavian port placement and right mastectomy with general surgery 10/14/22. She was recently referred to IR for a port-a-catheter assessment due to the port not aspirating well. Injection under fluoroscopy 04/21/23 demonstrated catheter tip retraction with the tip against the wall of the SVC. It flushed and aspirated well during that visit but IR recommended port revision due to the retracted catheter tip.    The patient presents today for left subclavian port removal with placement of a new right IJ port a catheter.  Past Medical History:  Diagnosis Date   Allergic rhinitis, seasonal    Arthritis    hands   Asthma    Years ago due to a cat. 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    GERD (gastroesophageal reflux disease)    History of benign neoplasm of tongue    per pt has had several bx's of lesions   History of cancer chemotherapy 1997   right breast cancer   History of external beam radiation therapy  1997   right breast cancer   History of iron deficiency anemia    History of non-ST elevation myocardial infarction (NSTEMI) 12/14/2008   History of right breast cancer 1997   s/p  right partial masectomy node dissection's,  completed chemo and radiation same year ,   no recurrence since   Hyperlipidemia    Mood disorder (HCC)    Myocardial infarction (HCC) 2010   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   OAB (overactive bladder)    OSA (obstructive sleep apnea) 2016   sleep study in epic 10-04-2014, AHI 22.7/ hr;  (06-17-2022  pt stated has not used dental appliance in few years, does not fit )   Peripheral neuropathy    feet   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/2008   x2  to proximal and mid LAD   Urinary incontinence, mixed    urologist--- dr pace   Wears glasses    Wears hearing aid in both ears     Past Surgical History:  Procedure Laterality Date   BOTOX INJECTION N/A 06/22/2022   Procedure: ABORTED BOTOX INJECTION 75 UNITS;  Surgeon: Noel Christmas, MD;  Location: [facility];  Service: Urology;  Laterality: N/A;   BOTOX INJECTION N/A 07/20/2022   Procedure: BOTOX INJECTION;  Surgeon: Noel Christmas, MD;  Location: [facility];  Service: Urology;  Laterality: N/A;   BREAST BIOPSY Right  08/27/2022   [place] RT BREAST BX W LOC DEV 1ST LESION IMG BX SPEC US GUIDE 08/27/2022 [facility] MAMMOGRAPHY   CATARACT EXTRACTION W/ INTRAOCULAR LENS IMPLANT Bilateral 2014   CORONARY ANGIOPLASTY WITH STENT PLACEMENT  12/16/2008   @[facility]   by dr berry;   PCI and stenting to proximal and mid LAD (DES) w/ residual D1 80%  nonobstructive disease involving RCA/ CFx,  ef 50%   CYSTOSCOPY N/A 06/22/2022   Procedure: CYSTOSCOPY, BLADDER BIOPSY, AND FULGERATION;  Surgeon: Noel Christmas, MD;  Location: [facility];  Service: Urology;  Laterality: N/A;  30 MINS   CYSTOSCOPY N/A 07/20/2022   Procedure: CYSTOSCOPY, BLADDER  FULGERATION;  Surgeon: Noel Christmas, MD;  Location: [facility];  Service: Urology;  Laterality: N/A;  30 MINS   EXCISION OF BREAST BIOPSY Right 1995   IR CV LINE INJECTION  04/21/2023   MASTECTOMY PARTIAL / LUMPECTOMY W/ AXILLARY LYMPHADENECTOMY Right 1997   PORTACATH PLACEMENT N/A 10/14/2022   Procedure: PORT PLACEMENT;  Surgeon: Almond Lint, MD;  Location: [facility] OR;  Service: General;  Laterality: N/A;   TOTAL MASTECTOMY Right 10/14/2022   Procedure: RIGHT MASTECTOMY;  Surgeon: Almond Lint, MD;  Location: [facility] OR;  Service: General;  Laterality: Right;  PEC BLOCK   TUBAL LIGATION Bilateral 1977    Allergies: Azithromycin and Demerol [meperidine]  Medications: Prior to Admission medications   Medication Sig Start Date End Date Taking? Authorizing Provider  acetaminophen (TYLENOL) 500 MG tablet Take 2 tablets (1,000 mg total) by mouth every 6 (six) hours. Patient taking differently: Take 1,000 mg by mouth 2 (two) times daily. 10/15/22   Almond Lint, MD  aspirin 325 MG tablet Take 325 mg by mouth daily.    [provider]  BD AUTOSHIELD DUO 30G X 5 MM MISC  11/28/22   [provider]  BD PEN NEEDLE NANO U/F 32G X 4 MM MISC SMARTSIG:2 Pen Needle SUB-Q Daily    [provider]  Bempedoic Acid-Ezetimibe (NEXLIZET) 180-10 MG TABS Take 1 tablet by mouth daily. Patient taking differently: Take 1 tablet by mouth every other day. 10/24/19   Yates Decamp, MD  Cholecalciferol (VITAMIN D3) 25 MCG (1000 UT) tablet Take 1,000 Units by mouth daily.    [provider]  citalopram (CELEXA) 40 MG tablet Take 40 mg by mouth daily.    [provider]  Continuous Blood Gluc Sensor (FREESTYLE LIBRE 14 DAY SENSOR) MISC Apply topically every 14 (fourteen) days. 09/12/22   [provider]  Cranberry Soft 500 MG CHEW Chew 500 mg by mouth daily.    [provider]  fluocinonide gel (LIDEX) 0.05 % Apply 1 Application topically daily as needed  (irritation on tongue).    [provider]  ibuprofen (ADVIL) 200 MG tablet Take 200 mg by mouth every 6 (six) hours as needed for moderate pain.    [provider]  insulin lispro (HUMALOG) 100 UNIT/ML injection Inject 0.3 mLs (30 Units total) into the skin 3 (three) times daily before meals. 01/14/23   Serena Croissant, MD  lidocaine (XYLOCAINE) 2 % solution Use as directed 15 mLs in the mouth or throat every 6 (six) hours as needed for mouth pain. 11/19/22   [provider]  lidocaine-prilocaine (EMLA) cream Apply to affected area once Patient taking differently: Apply 1 Application topically as needed (port). Apply to affected area once 10/25/22   Serena Croissant, MD  magic mouthwash (lidocaine, diphenhydrAMINE, alum & mag hydroxide) suspension Swish  and spit 5 mLs by mouth 4 (four) times daily as needed for mouth pain. 04/19/23   Serena Croissant, MD  metoprolol tartrate (LOPRESSOR) 25 MG tablet TAKE 1/2 TABLET TWICE DAILY 03/29/22   Yates Decamp, MD  Multiple Vitamins-Minerals (PRESERVISION AREDS) CAPS Take 1 capsule by mouth in the morning and at bedtime.    [provider]  nitroGLYCERIN (NITROSTAT) 0.4 MG SL tablet Place 0.4 mg under the tongue every 5 (five) minutes as needed for chest pain.    [provider]  ramipril (ALTACE) 2.5 MG capsule Take 2.5 mg by mouth daily.    [provider]  Sodium Chloride-Sodium Bicarb (SODIUM BICARBONATE/SODIUM CHLORIDE) SOLN Swish and spit as needed for mouth pain 11/26/22   Zigmund Daniel., MD  tiZANidine (ZANAFLEX) 2 MG tablet Take 1 tablet (2 mg total) by mouth every 8 (eight) hours as needed for muscle spasms. 10/15/22   Almond Lint, MD  TRESIBA FLEXTOUCH 200 UNIT/ML FlexTouch Pen Inject 32 Units into the skin daily after lunch. We've reduced your insulin dose, please follow up with your PCP outpatient and adjust this further as needed. Patient taking differently: Inject 64 Units into the skin daily after  lunch. We've reduced your insulin dose, please follow up with your PCP outpatient and adjust this further as needed. 11/26/22   Zigmund Daniel., MD  valACYclovir (VALTREX) 500 MG tablet Take 1 tablet (500 mg total) by mouth 2 (two) times daily. 11/19/22   Loa Socks, NP  vitamin B-12 (CYANOCOBALAMIN) 1000 MCG tablet Take 1,000 mcg by mouth daily.    [provider]     Family History  Problem Relation Age of Onset   Osteoporosis Mother    Breast cancer Mother 22   Heart Problems Mother    Other Father        MI   CVA Father    Asthma Father    Diabetes Mellitus II Father    Hypertension Sister    Ovarian cancer Sister 52    Social History   Socioeconomic History   Marital status: Married    Spouse name: Not on file   Number of children: 1   Years of education: Assoc    Highest education level: Not on file  Occupational History    Comment: retired  Tobacco Use   Smoking status: Former    Current packs/day: 0.00    Average packs/day: 1 pack/day for 20.0 years (20.0 ttl pk-yrs)    Types: Cigarettes    Start date: 82    Quit date: 1991    Years since quitting: 33.6   Smokeless tobacco: Never  Vaping Use   Vaping status: Never Used  Substance and Sexual Activity   Alcohol use: Yes    Alcohol/week: 7.0 standard drinks of alcohol    Types: 7 Glasses of wine per week    Comment: Wine prior to dinner   Drug use: Never   Sexual activity: Not on file  Other Topics Concern   Not on file  Social History Narrative   3 caffeine drinks a day    Social Determinants of Health   Financial Resource Strain: Not on file  Food Insecurity: No Food Insecurity (11/22/2022)   Hunger Vital Sign    Worried About Running Out of Food in the Last Year: Never true    Ran Out of Food in the Last Year: Never true  Transportation Needs: No Transportation Needs (11/22/2022)   PRAPARE - Transportation  Lack of Transportation (Medical): No    Lack of  Transportation (Non-Medical): No  Physical Activity: Not on file  Stress: Not on file  Social Connections: Not on file     Review of Systems: A 12 point ROS discussed and pertinent positives are indicated in the HPI above.  All other systems are negative.  Review of Systems  Constitutional:  Negative for chills and fever.  Respiratory:  Negative for cough and shortness of breath.   Cardiovascular:  Negative for chest pain.  Gastrointestinal:  Negative for abdominal pain, diarrhea, nausea and vomiting.  Musculoskeletal:  Negative for back pain.  Neurological:  Negative for dizziness and headaches.    Vital Signs: There were no vitals taken for this visit.  Physical Exam Vitals reviewed.  Constitutional:      General: She is not in acute distress. HENT:     Head: Normocephalic.     Mouth/Throat:     Mouth: Mucous membranes are moist.     Pharynx: Oropharynx is clear. No oropharyngeal exudate or posterior oropharyngeal erythema.  Cardiovascular:     Rate and Rhythm: Normal rate and regular rhythm.     Comments: (+) left chest port Pulmonary:     Effort: Pulmonary effort is normal.     Breath sounds: Normal breath sounds.  Abdominal:     General: There is no distension.     Palpations: Abdomen is soft.     Tenderness: There is no abdominal tenderness.  Skin:    General: Skin is warm and dry.  Neurological:     Mental Status: She is alert and oriented to person, place, and time.  Psychiatric:        Mood and Affect: Mood normal.        Behavior: Behavior normal.        Thought Content: Thought content normal.        Judgment: Judgment normal.      MD Evaluation Airway: WNL Heart: WNL Abdomen: WNL Chest/ Lungs: WNL ASA  Classification: 3 Mallampati/Airway Score: Two   Imaging: IR CV Line Injection  Result Date: 04/21/2023 INDICATION: 82 year old with history of breast cancer and left chest Port-A-Cath. Port-A-Cath is not aspirating well. EXAM: PORT INJECTION  WITH FLUOROSCOPY MEDICATIONS: None ANESTHESIA/SEDATION: None FLUOROSCOPY: Radiation Exposure Index (as provided by the fluoroscopic device): 15 mGy Kerma COMPLICATIONS: None immediate. PROCEDURE: The port was accessed by the radiology nurse at the beginning of the procedure. The port aspirated today. Port flushed without difficulty. Port-A-Cath was injected with contrast under fluoroscopy. Port was flushed with heparinized saline and the access needle was removed. Bandage was placed at the needle site. FINDINGS: Left subclavian Port-A-Cath with the tip near the junction of the left innominate vein and SVC. Catheter tip appears to be against the wall of the SVC. Port-A-Cath appears stable in position compared to a chest radiograph on 11/22/2022. However the port has retracted since the original placement on 10/14/2022. No evidence for port discontinuity or fracture. No significant fibrin sheath identified. IMPRESSION: Port-A-Cath tip has retracted since original placement on 10/14/2022. The catheter tip is against the wall of the SVC and this is likely the reason for intermittent problems. The Port-A-Cath was aspirating and flushing well at today's visit. Consider port revision due to the retracted catheter tip. Electronically Signed   By: Richarda Overlie M.D.   On: 04/21/2023 16:41    Labs:  CBC: Recent Labs    01/14/23 0957 02/04/23 1011 03/29/23 1119 04/19/23 0959  WBC 6.3  5.2 6.3 6.0  HGB 11.9* 11.9* 12.7 12.4  HCT 34.7* 35.9* 36.9 36.4  PLT 269 264 257 236    COAGS: Recent Labs    11/22/22 0058  INR 1.2    BMP: Recent Labs    01/14/23 0957 02/04/23 1011 03/29/23 1119 04/19/23 0959  NA 139 137 138 138  K 3.8 4.4 5.2* 4.2  CL 103 103 103 105  CO2 26 26 26 23   GLUCOSE 174* 220* 158* 172*  BUN 17 19 21 21   CALCIUM 9.5 9.5 9.9 9.5  CREATININE 0.80 0.84 1.14* 0.90  GFRNONAA >60 >60 49* >60    LIVER FUNCTION TESTS: Recent Labs    01/14/23 0957 02/04/23 1011 03/29/23 1119  04/19/23 0959  BILITOT 0.7 0.4 0.3 0.3  AST 15 16 19 17   ALT 16 18 21 15   ALKPHOS 49 47 39 46  PROT 7.8 7.5 7.6 7.6  ALBUMIN 4.4 4.4 4.7 4.5    TUMOR MARKERS: No results for input(s): "AFPTM", "CEA", "CA199", "CHROMGRNA" in the last 8760 hours.  Assessment and Plan:  82 y/o F with history of right breast cancer s/p mastectomy 10/14/22 with left subclavian surgically placed port-a-catheter with catheter tip retracted who presents today for removal of the left subclavian port and placement of a new right IJ port-a-catheter.    Risks and benefits of image-guided Port-a-catheter removal and new port placement were discussed with the patient including, but not limited to bleeding, infection, pneumothorax, or fibrin sheath development and need for additional procedures.   All of the patient's questions were answered, patient is agreeable to proceed.   Consent signed and in chart.   Thank you for this interesting consult.  I greatly enjoyed meeting Karina Foster and look forward to participating in their care.  A copy of this report was sent to the requesting provider on this date.  Electronically Signed: Villa Herb, PA-C 05/04/2023, 11:31 AM   I spent a total of  30 Minutes  in face to face in clinical consultation, greater than 50% of which was counseling/coordinating care for malpositioned left port.

## 2023-05-04 NOTE — Procedures (Signed)
Interventional Radiology Procedure Note  Procedure: RT internal jugular POWER PORT INSERTION LEFT SUBCLAVIAN PORT REMOVAL    Complications: None  Estimated Blood Loss:  MIN  Findings: FULL REPORT IN PACS TIP SVCRA RT PORT READY FOR USE     M. Ruel Favors, MD

## 2023-05-04 NOTE — Discharge Instructions (Addendum)
Implanted Port Insertion, Care After  The following information offers guidance on how to care for yourself after your procedure. Your health care provider may also give you more specific instructions. If you have problems or questions, contact your health care provider.  What can I expect after the procedure? After the procedure, it is common to have: Discomfort at the port insertion site. Bruising on the skin over the port. This should improve over 3-4 days.   Urgent needs - Interventional Radiology, clinic [phone] (mon-fri 8-5).   Wound - May remove dressing and shower in 24 to 48 hours.  Keep site clean and dry.  Replace with bandaid as needed.  Do not submerge in tub or water until site healing well. If closed with glue, glue will flake off on its own.   If ordered by your provider, may start Emla cream (or any other creams ointments or lotions) in 2 weeks or after incision is healed. Port is ready for use immediately.   After completion of treatment, your provider should have you set up for monthly port flushes.   Follow these instructions at home: [facility] care After your port is placed, you will get a manufacturer's information card. The card has information about your port. Keep this card with you at all times. Take care of the port as told by your health care provider. Ask your health care provider if you or a family member can get training for taking care of the port at home. A home health care nurse will be be available to help care for the port. Make sure to remember what type of port you have. Incision care     Follow instructions from your health care provider about how to take care of your port insertion site. Make sure you: Wash your hands with soap and water for at least 20 seconds before and after you change your bandage (dressing). If soap and water are not available, use hand sanitizer. Change your dressing as told by your health care provider. Leave stitches  (sutures), skin glue, or adhesive strips in place. These skin closures may need to stay in place for 2 weeks or longer. If adhesive strip edges start to loosen and curl up, you may trim the loose edges. Do not remove adhesive strips completely unless your health care provider tells you to do that. Check your port insertion site every day for signs of infection. Check for: Redness, swelling, or pain. Fluid or blood. Warmth. Pus or a bad smell. Activity Return to your normal activities as told by your health care provider. Ask your health care provider what activities are safe for you. You may have to avoid lifting. Ask your health care provider how much you can safely lift. General instructions Take over-the-counter and prescription medicines only as told by your health care provider. Do not take baths, swim, or use a hot tub until your health care provider approves. Ask your health care provider if you may take showers. You may only be allowed to take sponge baths. If you were given a sedative during the procedure, it can affect you for several hours. Do not drive or operate machinery until your health care provider says that it is safe. Wear a medical alert bracelet in case of an emergency. This will tell any health care providers that you have a port. Keep all follow-up visits. This is important. Contact a health care provider if: You cannot flush your port with saline as directed, or you cannot  draw blood from the port. You have a fever or chills. You have redness, swelling, or pain around your port insertion site. You have fluid or blood coming from your port insertion site. Your port insertion site feels warm to the touch. You have pus or a bad smell coming from the port insertion site. Get help right away if: You have chest pain or shortness of breath. You have bleeding from your port that you cannot control. These symptoms may be an emergency. Get help right away. Call 911. Do not  wait to see if the symptoms will go away. Do not drive yourself to the hospital. Summary Take care of the port as told by your health care provider. Keep the manufacturer's information card with you at all times. Change your dressing as told by your health care provider. Contact a health care provider if you have a fever or chills or if you have redness, swelling, or pain around your port insertion site. Keep all follow-up visits. This information is not intended to replace advice given to you by your health care provider. Make sure you discuss any questions you have with your health care provider. Document Revised: 02/24/2021 Document Reviewed: 02/24/2021 Elsevier Patient Education  2023 [employer]    Moderate Conscious Sedation  Adult  Care After (English)  After the procedure, it is common to have: Sleepiness for a few hours. Impaired judgment for a few hours. Trouble with balance. Nausea or vomiting if you eat too soon. Follow these instructions at home: For the time period you were told by your health care provider:  Rest. Do not participate in activities where you could fall or become injured. Do not drive or use machinery. Do not drink alcohol. Do not take sleeping pills or medicines that cause drowsiness. Do not make important decisions or sign legal documents. Do not take care of children on your own. Eating and drinking Follow instructions from your health care provider about what you may eat and drink. Drink enough fluid to keep your urine pale yellow. If you vomit: Drink clear fluids slowly and in small amounts as you are able. Clear fluids include water, ice chips, low-calorie sports drinks, and fruit juice that has water added to it (diluted fruit juice). Eat light and bland foods in small amounts as you are able. These foods include bananas, applesauce, rice, lean meats, toast, and crackers. General instructions Take over-the-counter and prescription medicines  only as told by your health care provider. Have a responsible adult stay with you for the time you are told. Do not use any products that contain nicotine or tobacco. These products include cigarettes, chewing tobacco, and vaping devices, such as e-cigarettes. If you need help quitting, ask your health care provider. Return to your normal activities as told by your health care provider. Ask your health care provider what activities are safe for you. Your health care provider may give you more instructions. Make sure you know what you can and cannot do. Contact a health care provider if: You are still sleepy or having trouble with balance after 24 hours. You feel light-headed. You vomit every time you eat or drink. You get a rash. You have a fever. You have redness or swelling around the IV site. Get help right away if: You have trouble breathing. You start to feel confused at home. These symptoms may be an emergency. Get help right away. Call 911. Do not wait to see if the symptoms will go away. Do not  drive yourself to the hospital. This information is not intended to replace advice given to you by your health care provider. Make sure you discuss any questions you have with your health care provider.  Implanted Port Removal, Care After The following information offers guidance on how to care for yourself after your procedure. Your health care provider may also give you more specific instructions. If you have problems or questions, contact your health care provider.  After the procedure, it is common to have: Soreness or pain near your incision. Some swelling or bruising near your incision. Follow these instructions at home: Medicines Take over-the-counter and prescription medicines only as told by your health care provider. If you were prescribed an antibiotic medicine, take it as told by your health care provider. Do not stop taking the antibiotic even if you start to feel  better. Bathing Do not take baths, swim, or use a hot tub until your health care provider approves. Ask your health care provider if you can take showers. You may only be allowed to take sponge baths. Incision care Follow instructions from your health care provider about how to take care of your incision. Make sure you: Wash your hands with soap and water for at least 20 seconds before and after you change your bandage (dressing). If soap and water are not available, use hand sanitizer. Change your dressing as told by your health care provider. Keep your dressing dry. Leave stitches (sutures), skin glue, or adhesive strips in place. These skin closures may need to stay in place for 2 weeks or longer. If adhesive strip edges start to loosen and curl up, you may trim the loose edges. Do not remove adhesive strips completely unless your health care provider tells you to do that. Check your incision area every day for signs of infection. Check for: More redness, swelling, or pain. More fluid or blood. Warmth. Pus or a bad smell. Activity Return to your normal activities as told by your health care provider. Ask your health care provider what activities are safe for you. You may have to avoid lifting. Ask your health care provider how much you can safely lift. Do not do activities that involve lifting your arms over your head. Driving If you were given a sedative during the procedure, it can affect you for several hours. Do not drive or operate machinery until your health care provider says that it is safe. If you did not receive a sedative, ask your health care provider when it is safe to drive. General instructions Do not use any products that contain nicotine or tobacco. These products include cigarettes, chewing tobacco, and vaping devices, such as e-cigarettes. These can delay healing after surgery. If you need help quitting, ask your health care provider. Keep all follow-up visits. This is  important. Contact a health care provider if: You have a fever or chills. You have more redness, swelling, or pain around your incision. You have more fluid or blood coming from your incision. Your incision feels warm to the touch. You have pus or a bad smell coming from your incision. You have pain that is not relieved by your pain medicine. Get help right away if: You have chest pain. You have difficulty breathing. These symptoms may be an emergency. Get help right away. Call 911. Do not wait to see if the symptoms will go away. Do not drive yourself to the hospital. Summary After the procedure, it is common to have pain, soreness, swelling, or bruising  near your incision. If you were prescribed an antibiotic medicine, take it as told by your health care provider. Do not stop taking the antibiotic even if you start to feel better. If you were given a sedative during the procedure, it can affect you for several hours. Do not drive or operate machinery until your health care provider says that it is safe. Return to your normal activities as told by your health care provider. Ask your health care provider what activities are safe for you. This information is not intended to replace advice given to you by your health care provider. Make sure you discuss any questions you have with your health care provider. Document Revised: 02/24/2021 Document Reviewed: 02/24/2021 Elsevier Patient Education  2023 [employer].

## 2023-05-06 MED FILL — Fosaprepitant Dimeglumine For IV Infusion 150 MG (Base Eq): INTRAVENOUS | Qty: 5 | Status: AC

## 2023-05-06 MED FILL — Dexamethasone Sodium Phosphate Inj 100 MG/10ML: INTRAMUSCULAR | Qty: 1 | Status: AC

## 2023-05-10 ENCOUNTER — Encounter: Payer: Self-pay | Admitting: Adult Health

## 2023-05-10 ENCOUNTER — Inpatient Hospital Stay: Payer: Medicare Other | Attending: Hematology and Oncology | Admitting: Adult Health

## 2023-05-10 ENCOUNTER — Inpatient Hospital Stay: Payer: Medicare Other

## 2023-05-10 VITALS — BP 132/56 | HR 84 | Temp 97.3°F | Resp 17 | Wt 149.1 lb

## 2023-05-10 DIAGNOSIS — Z5111 Encounter for antineoplastic chemotherapy: Secondary | ICD-10-CM | POA: Diagnosis not present

## 2023-05-10 DIAGNOSIS — Z95828 Presence of other vascular implants and grafts: Secondary | ICD-10-CM

## 2023-05-10 DIAGNOSIS — Z170 Estrogen receptor positive status [ER+]: Secondary | ICD-10-CM | POA: Diagnosis not present

## 2023-05-10 DIAGNOSIS — E118 Type 2 diabetes mellitus with unspecified complications: Secondary | ICD-10-CM | POA: Insufficient documentation

## 2023-05-10 DIAGNOSIS — Z87891 Personal history of nicotine dependence: Secondary | ICD-10-CM | POA: Diagnosis not present

## 2023-05-10 DIAGNOSIS — C50411 Malignant neoplasm of upper-outer quadrant of right female breast: Secondary | ICD-10-CM | POA: Diagnosis not present

## 2023-05-10 DIAGNOSIS — Z9011 Acquired absence of right breast and nipple: Secondary | ICD-10-CM | POA: Insufficient documentation

## 2023-05-10 DIAGNOSIS — Z79899 Other long term (current) drug therapy: Secondary | ICD-10-CM | POA: Insufficient documentation

## 2023-05-10 LAB — CBC WITH DIFFERENTIAL (CANCER CENTER ONLY)
Abs Immature Granulocytes: 0.01 10*3/uL (ref 0.00–0.07)
Basophils Absolute: 0 10*3/uL (ref 0.0–0.1)
Basophils Relative: 1 %
Eosinophils Absolute: 0.4 10*3/uL (ref 0.0–0.5)
Eosinophils Relative: 8 %
HCT: 35.1 % — ABNORMAL LOW (ref 36.0–46.0)
Hemoglobin: 11.7 g/dL — ABNORMAL LOW (ref 12.0–15.0)
Immature Granulocytes: 0 %
Lymphocytes Relative: 20 %
Lymphs Abs: 1.1 10*3/uL (ref 0.7–4.0)
MCH: 30.4 pg (ref 26.0–34.0)
MCHC: 33.3 g/dL (ref 30.0–36.0)
MCV: 91.2 fL (ref 80.0–100.0)
Monocytes Absolute: 0.6 10*3/uL (ref 0.1–1.0)
Monocytes Relative: 11 %
Neutro Abs: 3.3 10*3/uL (ref 1.7–7.7)
Neutrophils Relative %: 60 %
Platelet Count: 232 10*3/uL (ref 150–400)
RBC: 3.85 MIL/uL — ABNORMAL LOW (ref 3.87–5.11)
RDW: 14.6 % (ref 11.5–15.5)
WBC Count: 5.4 10*3/uL (ref 4.0–10.5)
nRBC: 0 % (ref 0.0–0.2)

## 2023-05-10 LAB — CMP (CANCER CENTER ONLY)
ALT: 19 U/L (ref 0–44)
AST: 21 U/L (ref 15–41)
Albumin: 4.5 g/dL (ref 3.5–5.0)
Alkaline Phosphatase: 40 U/L (ref 38–126)
Anion gap: 8 (ref 5–15)
BUN: 20 mg/dL (ref 8–23)
CO2: 27 mmol/L (ref 22–32)
Calcium: 9.6 mg/dL (ref 8.9–10.3)
Chloride: 103 mmol/L (ref 98–111)
Creatinine: 1.05 mg/dL — ABNORMAL HIGH (ref 0.44–1.00)
GFR, Estimated: 54 mL/min — ABNORMAL LOW (ref >60)
Glucose, Bld: 200 mg/dL — ABNORMAL HIGH (ref 70–99)
Potassium: 4.1 mmol/L (ref 3.5–5.1)
Sodium: 138 mmol/L (ref 135–145)
Total Bilirubin: 0.5 mg/dL (ref 0.3–1.2)
Total Protein: 7.5 g/dL (ref 6.5–8.1)

## 2023-05-10 MED ORDER — SODIUM CHLORIDE 0.9 % IV SOLN: Freq: Once | INTRAVENOUS | Status: AC

## 2023-05-10 MED ORDER — SODIUM CHLORIDE 0.9% FLUSH
10.0000 mL | INTRAVENOUS | Status: DC | PRN
  Administered 2023-05-10: 10 mL

## 2023-05-10 MED ORDER — METHOTREXATE SODIUM CHEMO INJECTION (PF) 50 MG/2ML
40.0000 mg/m2 | Freq: Once | INTRAMUSCULAR | Status: AC
  Administered 2023-05-10: 72.75 mg via INTRAVENOUS
  Filled 2023-05-10: qty 2.91

## 2023-05-10 MED ORDER — PALONOSETRON HCL INJECTION 0.25 MG/5ML
0.2500 mg | Freq: Once | INTRAVENOUS | Status: AC
  Administered 2023-05-10: 0.25 mg via INTRAVENOUS
  Filled 2023-05-10: qty 5

## 2023-05-10 MED ORDER — SODIUM CHLORIDE 0.9 % IV SOLN: 4.0000 mg | Freq: Once | INTRAVENOUS | Status: DC

## 2023-05-10 MED ORDER — SODIUM CHLORIDE 0.9% FLUSH
10.0000 mL | Freq: Once | INTRAVENOUS | Status: AC
  Administered 2023-05-10: 10 mL

## 2023-05-10 MED ORDER — SODIUM CHLORIDE 0.9 % IV SOLN
900.0000 mg | Freq: Once | INTRAVENOUS | Status: AC
  Administered 2023-05-10: 900 mg via INTRAVENOUS
  Filled 2023-05-10: qty 45

## 2023-05-10 MED ORDER — HEPARIN SOD (PORK) LOCK FLUSH 100 UNIT/ML IV SOLN
500.0000 [IU] | Freq: Once | INTRAVENOUS | Status: AC | PRN
  Administered 2023-05-10: 500 [IU]

## 2023-05-10 MED ORDER — DEXAMETHASONE SODIUM PHOSPHATE 10 MG/ML IJ SOLN
4.0000 mg | Freq: Once | INTRAMUSCULAR | Status: AC
  Administered 2023-05-10: 4 mg via INTRAVENOUS
  Filled 2023-05-10: qty 1

## 2023-05-10 MED ORDER — SODIUM CHLORIDE 0.9 % IV SOLN
150.0000 mg | Freq: Once | INTRAVENOUS | Status: AC
  Administered 2023-05-10: 150 mg via INTRAVENOUS
  Filled 2023-05-10: qty 150

## 2023-05-10 MED ORDER — FLUOROURACIL CHEMO INJECTION 2.5 GM/50ML
400.0000 mg/m2 | Freq: Once | INTRAVENOUS | Status: AC
  Administered 2023-05-10: 750 mg via INTRAVENOUS
  Filled 2023-05-10: qty 15

## 2023-05-10 NOTE — Progress Notes (Signed)
[facility] Cancer Follow up:    Karina Greathouse, MD [address] [place] [address]   DIAGNOSIS:  Cancer Staging  Malignant neoplasm of upper-outer quadrant of right breast in female, estrogen receptor positive (HCC) Staging form: Breast, AJCC 8th Edition - Pathologic stage from 10/14/2022: Stage IIA (pT2, pN0, cM0, G3, ER+, PR-, HER2-) - Signed by Loa Socks, NP on 05/10/2023 Histologic grading system: 3 grade system   SUMMARY OF ONCOLOGIC HISTORY: Oncology History  Malignant neoplasm of upper-outer quadrant of right breast in female, estrogen receptor positive (HCC)  08/27/2022 Initial Diagnosis   Screening mammogram detected right breast mass by ultrasound measured 2.3 cm biopsy revealed grade 3 IDC ER 50%, PR 0%, Ki-67 90%, HER2 1+ negative   10/14/2022 Surgery   Right mastectomy: 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 female returns for follow-up and evaluation prior to receiving her fourth cycle of CMF chemotherapy.  She missed her last treatment due to her port not functioning and had to undergo a revision.  She is here to resume treatment today.  She is feeling well and tells me her main issue is fatigue however this resolves with rest.  Her blood sugar today is 200.  She tells me that she took her insulin earlier.   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   Depression 12/06/2022   Wound dehiscence 12/06/2022   Neutropenic fever (HCC) 11/22/2022   UTI (urinary tract infection) 11/22/2022   Tachycardia 11/22/2022   AKI (acute kidney injury) (HCC) 11/22/2022   Hypokalemia 11/22/2022   Severe neutropenia (HCC) 11/22/2022   Port-A-Cath in place 11/19/2022   Breast cancer of upper-outer quadrant of right female breast (HCC) 10/14/2022   Hyperlipemia 10/14/2022   Malignant neoplasm of upper-outer quadrant of right breast in female, estrogen receptor positive (HCC) 09/15/2022   History of MI (myocardial infarction) 09/13/2022   BPPV (benign paroxysmal positional vertigo) 05/01/2019   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  MEDICAL HISTORY: Past Medical History:  Diagnosis Date   Allergic rhinitis, seasonal    Arthritis    hands   Asthma    Years ago due to a cat. 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    GERD (gastroesophageal reflux disease)    History of benign neoplasm of tongue    per pt has had several bx's of lesions   History of cancer chemotherapy 1997   right breast cancer   History of external beam radiation therapy 1997   right breast  cancer   History of iron deficiency anemia    History of non-ST elevation myocardial infarction (NSTEMI) 12/14/2008   History of right breast cancer 1997   s/p  right partial masectomy node dissection's,  completed chemo and radiation same year ,   no recurrence since   Hyperlipidemia    Mood disorder (HCC)    Myocardial infarction (HCC) 2010   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   OAB (overactive bladder)    OSA (obstructive sleep apnea) 2016   sleep study in epic 10-04-2014, AHI 22.7/ hr;  (06-17-2022  pt stated has not used dental appliance in few years, does not fit )   Peripheral neuropathy    feet   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/2008   x2  to proximal and mid LAD   Urinary incontinence, mixed    urologist--- dr pace   Wears glasses    Wears hearing aid in both ears     SURGICAL HISTORY: Past Surgical History:  Procedure Laterality Date   BOTOX INJECTION N/A 06/22/2022   Procedure: ABORTED BOTOX INJECTION 75 UNITS;  Surgeon: Noel Christmas, MD;  Location: [facility];  Service: Urology;  Laterality: N/A;   BOTOX INJECTION N/A 07/20/2022   Procedure: BOTOX INJECTION;  Surgeon: Noel Christmas, MD;  Location: [facility];  Service: Urology;  Laterality: N/A;   BREAST BIOPSY Right 08/27/2022   [place] RT BREAST BX W LOC DEV 1ST LESION IMG BX SPEC US GUIDE 08/27/2022 [facility] MAMMOGRAPHY   CATARACT EXTRACTION W/ INTRAOCULAR LENS IMPLANT Bilateral 2014   CORONARY ANGIOPLASTY WITH STENT PLACEMENT  12/16/2008   @[facility]   by dr berry;   PCI and stenting to proximal and mid LAD (DES) w/ residual D1 80%  nonobstructive disease involving RCA/ CFx,  ef 50%   CYSTOSCOPY N/A 06/22/2022   Procedure: CYSTOSCOPY, BLADDER BIOPSY, AND FULGERATION;  Surgeon: Noel Christmas, MD;  Location: [facility];  Service: Urology;  Laterality: N/A;  30 MINS   CYSTOSCOPY N/A 07/20/2022   Procedure: CYSTOSCOPY, BLADDER  FULGERATION;  Surgeon: Noel Christmas, MD;  Location: [facility];  Service: Urology;  Laterality: N/A;  30 MINS   EXCISION OF BREAST BIOPSY Right 1995   IR CV LINE INJECTION  04/21/2023   IR IMAGING GUIDED PORT INSERTION  05/04/2023   IR REMOVAL TUN ACCESS W/ PORT W/O FL MOD SED  05/04/2023   MASTECTOMY PARTIAL / LUMPECTOMY W/ AXILLARY LYMPHADENECTOMY Right 1997   PORTACATH PLACEMENT N/A 10/14/2022   Procedure: PORT PLACEMENT;  Surgeon: Almond Lint, MD;  Location: [facility] OR;  Service: General;  Laterality: N/A;   TOTAL MASTECTOMY Right 10/14/2022   Procedure: RIGHT MASTECTOMY;  Surgeon: Almond Lint, MD;  Location: [facility] OR;  Service: General;  Laterality: Right;  PEC BLOCK   TUBAL LIGATION Bilateral 1977    SOCIAL HISTORY: Social History   Socioeconomic History   Marital status: Married    Spouse name: Not on file  Number of children: 1   Years of education: Assoc    Highest education level: Not on file  Occupational History    Comment: retired  Tobacco Use   Smoking status: Former    Current packs/day: 0.00    Average packs/day: 1 pack/day for 20.0 years (20.0 ttl pk-yrs)    Types: Cigarettes    Start date: 22    Quit date: 1991    Years since quitting: 33.6   Smokeless tobacco: Never  Vaping Use   Vaping status: Never Used  Substance and Sexual Activity   Alcohol use: Yes    Alcohol/week: 7.0 standard drinks of alcohol    Types: 7 Glasses of wine per week    Comment: Wine prior to dinner   Drug use: Never   Sexual activity: Not on file  Other Topics Concern   Not on file  Social History Narrative   3 caffeine drinks a day    Social Determinants of Health   Financial Resource Strain: Not on file  Food Insecurity: No Food Insecurity (11/22/2022)   Hunger Vital Sign    Worried About Running Out of Food in the Last Year: Never true    Ran Out of Food in the Last Year: Never true  Transportation Needs: No Transportation Needs (11/22/2022)   PRAPARE -  Administrator, Civil Service (Medical): No    Lack of Transportation (Non-Medical): No  Physical Activity: Not on file  Stress: Not on file  Social Connections: Not on file  Intimate Partner Violence: Not At Risk (11/22/2022)   Humiliation, Afraid, Rape, and Kick questionnaire    Fear of Current or Ex-Partner: No    Emotionally Abused: No    Physically Abused: No    Sexually Abused: No    FAMILY HISTORY: Family History  Problem Relation Age of Onset   Osteoporosis Mother    Breast cancer Mother 51   Heart Problems Mother    Other Father        MI   CVA Father    Asthma Father    Diabetes Mellitus II Father    Hypertension Sister    Ovarian cancer Sister 75    Review of Systems  Constitutional:  Negative for appetite change, chills, fatigue, fever and unexpected weight change.  HENT:   Negative for hearing loss, lump/mass and trouble swallowing.   Eyes:  Negative for eye problems and icterus.  Respiratory:  Negative for chest tightness, cough and shortness of breath.   Cardiovascular:  Negative for chest pain, leg swelling and palpitations.  Gastrointestinal:  Negative for abdominal distention, abdominal pain, constipation, diarrhea, nausea and vomiting.  Endocrine: Negative for hot flashes.  Genitourinary:  Negative for difficulty urinating.   Musculoskeletal:  Negative for arthralgias.  Skin:  Negative for itching and rash.  Neurological:  Negative for dizziness, extremity weakness, headaches and numbness.  Hematological:  Negative for adenopathy. Does not bruise/bleed easily.  Psychiatric/Behavioral:  Negative for depression. The patient is not nervous/anxious.       PHYSICAL EXAMINATION   Onc Performance Status - 05/10/23 1000       ECOG Perf Status   ECOG Perf Status Fully active, able to carry on all pre-disease performance without restriction      KPS SCALE   KPS % SCORE Normal, no compliants, no evidence of disease             Vitals:    05/10/23 1021  BP: (!) 132/56  Pulse: 84  Resp: 17  Temp: (!) 97.3 F (36.3 C)  SpO2: 99%    Physical Exam Constitutional:      General: She is not in acute distress.    Appearance: Normal appearance. She is not toxic-appearing.  HENT:     Head: Normocephalic and atraumatic.     Mouth/Throat:     Mouth: Mucous membranes are moist.     Pharynx: Oropharynx is clear. No oropharyngeal exudate or posterior oropharyngeal erythema.  Eyes:     General: No scleral icterus. Cardiovascular:     Rate and Rhythm: Normal rate and regular rhythm.     Pulses: Normal pulses.     Heart sounds: Normal heart sounds.  Pulmonary:     Effort: Pulmonary effort is normal.     Breath sounds: Normal breath sounds.  Abdominal:     General: Abdomen is flat. Bowel sounds are normal. There is no distension.     Palpations: Abdomen is soft.     Tenderness: There is no abdominal tenderness.  Musculoskeletal:        General: No swelling.     Cervical back: Neck supple.  Lymphadenopathy:     Cervical: No cervical adenopathy.  Skin:    General: Skin is warm and dry.     Findings: No rash.  Neurological:     General: No focal deficit present.     Mental Status: She is alert.  Psychiatric:        Mood and Affect: Mood normal.        Behavior: Behavior normal.     LABORATORY DATA:  CBC    Component Value Date/Time   WBC 5.4 05/10/2023 1000   WBC 15.2 (H) 11/26/2022 0655   RBC 3.85 (L) 05/10/2023 1000   HGB 11.7 (L) 05/10/2023 1000   HCT 35.1 (L) 05/10/2023 1000   PLT 232 05/10/2023 1000   MCV 91.2 05/10/2023 1000   MCH 30.4 05/10/2023 1000   MCHC 33.3 05/10/2023 1000   RDW 14.6 05/10/2023 1000   LYMPHSABS 1.1 05/10/2023 1000   MONOABS 0.6 05/10/2023 1000   EOSABS 0.4 05/10/2023 1000   BASOSABS 0.0 05/10/2023 1000    CMP     Component Value Date/Time   NA 138 05/10/2023 1000   K 4.1 05/10/2023 1000   CL 103 05/10/2023 1000   CO2 27 05/10/2023 1000   GLUCOSE 200 (H) 05/10/2023  1000   BUN 20 05/10/2023 1000   CREATININE 1.05 (H) 05/10/2023 1000   CALCIUM 9.6 05/10/2023 1000   PROT 7.5 05/10/2023 1000   ALBUMIN 4.5 05/10/2023 1000   AST 21 05/10/2023 1000   ALT 19 05/10/2023 1000   ALKPHOS 40 05/10/2023 1000   BILITOT 0.5 05/10/2023 1000   GFRNONAA 54 (L) 05/10/2023 1000   GFRAA  12/18/2008 0505    >60        The eGFR has been calculated using the MDRD equation. This calculation has not been validated in all clinical situations. eGFR's persistently <60 mL/min signify possible Chronic Kidney Disease.        ASSESSMENT and THERAPY PLAN:   Malignant neoplasm of upper-outer quadrant of right breast in female, estrogen receptor positive (HCC) 10/14/2022:Right mastectomy: High-grade IDC 3.4 cm with focal sarcomatoid features (metaplastic) 3.4 cm grade 3 with DCIS, lymphovascular invasion present, focal perineural invasion present, margins negative ER 0%, PR 0%, HER2 1+ (repeat prognostic panel was triple negative)   Treatment plan: adjuvant chemotherapy with CMF x 6 cycles started 11/12/2022 (held for wound infection,  resumed 03/29/2023) 2. no role of antiestrogen therapy since her repeat prognostic panel was triple negative -------------------------------------------------------------------------------------------------------------------------------------------- Current treatment: Cycle 4 CMF  Chemo toxicities: Hospitalization 11/21/2022-11/26/2022: Cellulitis with neutropenic fever Fatigue: In his with energy conservation Hyperglycemia: I reduced her dexamethasone dosing to 4 mg pretreatment instead of 10.  She will continue on her current insulin regimens for her diabetes. Mouth sores: Resolved Port revision needed resulting in delay in treatment by 3 weeks.  Karina Foster will proceed with treatment today.  She is tolerating this well.  She will continue at her current dosage.  I reviewed her labs which are stable.   Return to clinic every 3 weeks for  chemotherapy.     All questions were answered. The patient knows to call the clinic with any problems, questions or concerns. We can certainly see the patient much sooner if necessary.  Total encounter time:30 minutes*in face-to-face visit time, chart review, lab review, care coordination, order entry, and documentation of the encounter time.    Lillard Anes, NP 05/10/23 11:15 AM Medical Oncology and Hematology [facility] [address] [place], [place] [address] Tel. [phone]    Fax. [phone]  *Total Encounter Time as defined by the Centers for Medicare and Medicaid Services includes, in addition to the face-to-face time of a patient visit (documented in the note above) non-face-to-face time: obtaining and reviewing outside history, ordering and reviewing medications, tests or procedures, care coordination (communications with other health care professionals or caregivers) and documentation in the medical record.

## 2023-05-10 NOTE — Assessment & Plan Note (Signed)
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 no role of antiestrogen therapy since her repeat prognostic panel was triple negative -------------------------------------------------------------------------------------------------------------------------------------------- Current treatment: Cycle 4 CMF  Chemo toxicities: Hospitalization 11/21/2022-11/26/2022: Cellulitis with neutropenic fever Fatigue: In his with energy conservation Hyperglycemia: I reduced her dexamethasone dosing to 4 mg pretreatment instead of 10.  She will continue on her current insulin regimens for her diabetes. Mouth sores: Resolved Port revision needed resulting in delay in treatment by 3 weeks.  [place] will proceed with treatment today.  She is tolerating this well.  She will continue at her current dosage.  I reviewed her labs which are stable.   Return to clinic every 3 weeks for chemotherapy.

## 2023-05-11 ENCOUNTER — Telehealth: Payer: Self-pay | Admitting: Adult Health

## 2023-05-11 NOTE — Telephone Encounter (Signed)
Scheduled appointments per WQ. Patient is aware of the made appointments.  

## 2023-05-20 ENCOUNTER — Other Ambulatory Visit: Payer: Self-pay | Admitting: Cardiology

## 2023-05-30 MED FILL — Fosaprepitant Dimeglumine For IV Infusion 150 MG (Base Eq): INTRAVENOUS | Qty: 5 | Status: AC

## 2023-05-31 ENCOUNTER — Other Ambulatory Visit: Payer: Self-pay | Admitting: *Deleted

## 2023-05-31 ENCOUNTER — Inpatient Hospital Stay: Payer: Medicare Other

## 2023-05-31 ENCOUNTER — Encounter: Payer: Self-pay | Admitting: *Deleted

## 2023-05-31 ENCOUNTER — Inpatient Hospital Stay (HOSPITAL_BASED_OUTPATIENT_CLINIC_OR_DEPARTMENT_OTHER): Payer: Medicare Other | Admitting: Hematology and Oncology

## 2023-05-31 VITALS — BP 121/71 | HR 87 | Temp 97.7°F | Resp 18 | Ht 67.0 in | Wt 148.5 lb

## 2023-05-31 DIAGNOSIS — C50411 Malignant neoplasm of upper-outer quadrant of right female breast: Secondary | ICD-10-CM

## 2023-05-31 DIAGNOSIS — Z95828 Presence of other vascular implants and grafts: Secondary | ICD-10-CM

## 2023-05-31 DIAGNOSIS — Z87891 Personal history of nicotine dependence: Secondary | ICD-10-CM | POA: Diagnosis not present

## 2023-05-31 DIAGNOSIS — Z5111 Encounter for antineoplastic chemotherapy: Secondary | ICD-10-CM | POA: Diagnosis not present

## 2023-05-31 DIAGNOSIS — Z9011 Acquired absence of right breast and nipple: Secondary | ICD-10-CM | POA: Diagnosis not present

## 2023-05-31 DIAGNOSIS — Z170 Estrogen receptor positive status [ER+]: Secondary | ICD-10-CM

## 2023-05-31 DIAGNOSIS — E118 Type 2 diabetes mellitus with unspecified complications: Secondary | ICD-10-CM | POA: Diagnosis not present

## 2023-05-31 LAB — CMP (CANCER CENTER ONLY)
ALT: 13 U/L (ref 0–44)
AST: 14 U/L — ABNORMAL LOW (ref 15–41)
Albumin: 4.4 g/dL (ref 3.5–5.0)
Alkaline Phosphatase: 46 U/L (ref 38–126)
Anion gap: 8 (ref 5–15)
BUN: 21 mg/dL (ref 8–23)
CO2: 27 mmol/L (ref 22–32)
Calcium: 9.4 mg/dL (ref 8.9–10.3)
Chloride: 103 mmol/L (ref 98–111)
Creatinine: 0.91 mg/dL (ref 0.44–1.00)
GFR, Estimated: >60 mL/min (ref >60)
Glucose, Bld: 145 mg/dL — ABNORMAL HIGH (ref 70–99)
Potassium: 4.1 mmol/L (ref 3.5–5.1)
Sodium: 138 mmol/L (ref 135–145)
Total Bilirubin: 0.4 mg/dL (ref 0.3–1.2)
Total Protein: 7.4 g/dL (ref 6.5–8.1)

## 2023-05-31 LAB — CBC WITH DIFFERENTIAL (CANCER CENTER ONLY)
Abs Immature Granulocytes: 0.19 10*3/uL — ABNORMAL HIGH (ref 0.00–0.07)
Basophils Absolute: 0.1 10*3/uL (ref 0.0–0.1)
Basophils Relative: 1 %
Eosinophils Absolute: 0.2 10*3/uL (ref 0.0–0.5)
Eosinophils Relative: 4 %
HCT: 31.8 % — ABNORMAL LOW (ref 36.0–46.0)
Hemoglobin: 11 g/dL — ABNORMAL LOW (ref 12.0–15.0)
Immature Granulocytes: 4 %
Lymphocytes Relative: 20 %
Lymphs Abs: 1.1 10*3/uL (ref 0.7–4.0)
MCH: 32.2 pg (ref 26.0–34.0)
MCHC: 34.6 g/dL (ref 30.0–36.0)
MCV: 93 fL (ref 80.0–100.0)
Monocytes Absolute: 1.1 10*3/uL — ABNORMAL HIGH (ref 0.1–1.0)
Monocytes Relative: 20 %
Neutro Abs: 2.8 10*3/uL (ref 1.7–7.7)
Neutrophils Relative %: 51 %
Platelet Count: 237 10*3/uL (ref 150–400)
RBC: 3.42 MIL/uL — ABNORMAL LOW (ref 3.87–5.11)
RDW: 15.1 % (ref 11.5–15.5)
WBC Count: 5.5 10*3/uL (ref 4.0–10.5)
nRBC: 0.4 % — ABNORMAL HIGH (ref 0.0–0.2)

## 2023-05-31 MED ORDER — FLUOROURACIL CHEMO INJECTION 2.5 GM/50ML
400.0000 mg/m2 | Freq: Once | INTRAVENOUS | Status: AC
  Administered 2023-05-31: 750 mg via INTRAVENOUS
  Filled 2023-05-31: qty 15

## 2023-05-31 MED ORDER — SODIUM CHLORIDE 0.9% FLUSH
10.0000 mL | INTRAVENOUS | Status: DC | PRN
  Administered 2023-05-31: 10 mL

## 2023-05-31 MED ORDER — PALONOSETRON HCL INJECTION 0.25 MG/5ML
0.2500 mg | Freq: Once | INTRAVENOUS | Status: AC
  Administered 2023-05-31: 0.25 mg via INTRAVENOUS
  Filled 2023-05-31: qty 5

## 2023-05-31 MED ORDER — SODIUM CHLORIDE 0.9% FLUSH
10.0000 mL | Freq: Once | INTRAVENOUS | Status: AC
  Administered 2023-05-31: 10 mL

## 2023-05-31 MED ORDER — METHOTREXATE SODIUM CHEMO INJECTION (PF) 50 MG/2ML
40.0000 mg/m2 | Freq: Once | INTRAMUSCULAR | Status: AC
  Administered 2023-05-31: 72.75 mg via INTRAVENOUS
  Filled 2023-05-31: qty 2.91

## 2023-05-31 MED ORDER — DEXAMETHASONE SODIUM PHOSPHATE 10 MG/ML IJ SOLN
4.0000 mg | Freq: Once | INTRAMUSCULAR | Status: AC
  Administered 2023-05-31: 4 mg via INTRAVENOUS
  Filled 2023-05-31: qty 1

## 2023-05-31 MED ORDER — SODIUM CHLORIDE 0.9 % IV SOLN
900.0000 mg | Freq: Once | INTRAVENOUS | Status: AC
  Administered 2023-05-31: 900 mg via INTRAVENOUS
  Filled 2023-05-31: qty 45

## 2023-05-31 MED ORDER — SODIUM CHLORIDE 0.9 % IV SOLN: Freq: Once | INTRAVENOUS | Status: AC

## 2023-05-31 MED ORDER — SODIUM BICARBONATE/SODIUM CHLORIDE MOUTHWASH: OROMUCOSAL | 1 refills | Status: DC

## 2023-05-31 MED ORDER — HEPARIN SOD (PORK) LOCK FLUSH 100 UNIT/ML IV SOLN
500.0000 [IU] | Freq: Once | INTRAVENOUS | Status: AC | PRN
  Administered 2023-05-31: 500 [IU]

## 2023-05-31 MED ORDER — SODIUM CHLORIDE 0.9 % IV SOLN
150.0000 mg | Freq: Once | INTRAVENOUS | Status: AC
  Administered 2023-05-31: 150 mg via INTRAVENOUS
  Filled 2023-05-31: qty 150
  Filled 2023-05-31: qty 5

## 2023-05-31 NOTE — Progress Notes (Signed)
Patient Care Team: Chilton Greathouse, MD as PCP - General (Internal Medicine) Chilton Greathouse, MD as Consulting Physician (Internal Medicine) Serena Croissant, MD as Consulting Physician (Hematology and Oncology) Pershing Proud, RN as Oncology Nurse Navigator Donnelly Angelica, RN as Oncology Nurse Navigator  DIAGNOSIS:  Encounter Diagnosis  Name Primary?   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) 3.4 cm grade 3 with DCIS, lymphovascular invasion present, focal perineural invasion present, margins negative ER 50%, PR 0%, HER2 1+   10/14/2022 Cancer Staging   Staging form: Breast, AJCC 8th Edition - Pathologic stage from 10/14/2022: Stage IIA (pT2, pN0, cM0, G3, ER+, PR-, HER2-) - Signed by Loa Socks, NP on 05/10/2023 Histologic grading system: 3 grade system   11/12/2022 -  Chemotherapy   Patient is on Treatment Plan : BREAST Adjuvant CMF IV q21d       CHIEF COMPLIANT:   Discussed the use of AI scribe software for clinical note transcription with the patient, who gave verbal consent to proceed.  History of Present Illness   The patient, with a history of breast cancer, is currently undergoing chemotherapy with CMF. She reports persistent fatigue, which she attributes more to sleep disturbances due to bladder issues rather than the chemotherapy itself. She wakes up every two hours due to the bladder issues and plans to have a Botox injection for bladder control after the  completion of chemotherapy.  In addition to fatigue, the patient also reports mouth sores. She has been using a sodium chloride rinse for relief, but the effect is temporary. She requests a refill of the rinse. She also has antibiotic ointment from a previous oral surgeon that she uses to help the sores heal.      ALLERGIES:  is allergic to azithromycin and demerol [meperidine].  MEDICATIONS:  Current Outpatient Medications  Medication Sig Dispense Refill   acetaminophen (TYLENOL) 500 MG tablet Take 2 tablets (1,000 mg total) by mouth every 6 (six) hours. (Patient taking differently: Take 1,000 mg by mouth 2 (two) times daily.) 30 tablet 0   aspirin 325 MG tablet Take 325 mg by mouth daily.     BD AUTOSHIELD DUO 30G X 5 MM MISC      BD PEN NEEDLE NANO U/F 32G X 4 MM MISC SMARTSIG:2 Pen Needle SUB-Q Daily     Bempedoic Acid-Ezetimibe (NEXLIZET) 180-10 MG TABS Take 1 tablet by mouth daily. (Patient taking differently: Take 1 tablet by mouth every other day.) 90 tablet 3   citalopram (CELEXA) 40 MG tablet Take 40 mg by mouth daily.     Continuous Blood Gluc Sensor (FREESTYLE LIBRE 14 DAY SENSOR) MISC Apply topically every 14 (fourteen) days.     Cranberry Soft 500 MG CHEW Chew 500 mg by mouth daily.     fluocinonide gel (LIDEX) 0.05 % Apply 1 Application topically daily as needed (irritation on tongue).     ibuprofen (ADVIL) 200 MG tablet Take 200 mg by mouth every 6 (six) hours as needed for moderate pain.  insulin lispro (HUMALOG) 100 UNIT/ML injection Inject 0.3 mLs (30 Units total) into the skin 3 (three) times daily before meals. 10 mL 11   lidocaine (XYLOCAINE) 2 % solution Use as directed 15 mLs in the mouth or throat every 6 (six) hours as needed for mouth pain.     magic mouthwash (lidocaine, diphenhydrAMINE, alum & mag hydroxide) suspension Swish and spit 5 mLs by mouth 4 (four) times daily as needed for mouth pain. 240 mL 1   metoprolol tartrate (LOPRESSOR) 25 MG tablet TAKE 1/2  TABLET TWICE DAILY 180 tablet 3   Multiple Vitamins-Minerals (PRESERVISION AREDS) CAPS Take 1 capsule by mouth in the morning and at bedtime.     nitroGLYCERIN (NITROSTAT) 0.4 MG SL tablet Place 0.4 mg under the tongue every 5 (five) minutes as needed for chest pain.     ramipril (ALTACE) 2.5 MG capsule Take 2.5 mg by mouth daily.     tiZANidine (ZANAFLEX) 2 MG tablet Take 1 tablet (2 mg total) by mouth every 8 (eight) hours as needed for muscle spasms. 20 tablet 1   TRESIBA FLEXTOUCH 200 UNIT/ML FlexTouch Pen Inject 32 Units into the skin daily after lunch. We've reduced your insulin dose, please follow up with your PCP outpatient and adjust this further as needed. (Patient taking differently: Inject 64 Units into the skin daily after lunch. We've reduced your insulin dose, please follow up with your PCP outpatient and adjust this further as needed.)     valACYclovir (VALTREX) 500 MG tablet Take 1 tablet (500 mg total) by mouth 2 (two) times daily. 60 tablet 0   vitamin B-12 (CYANOCOBALAMIN) 1000 MCG tablet Take 1,000 mcg by mouth daily.     Sodium Chloride-Sodium Bicarb (SODIUM BICARBONATE/SODIUM CHLORIDE) SOLN Swish and spit as needed for mouth pain 250 mL 1   No current facility-administered medications for this visit.    PHYSICAL EXAMINATION: ECOG PERFORMANCE STATUS: 1 - Symptomatic but completely ambulatory  Vitals:   05/31/23 1022  BP: 121/71  Pulse: 87  Resp: 18  Temp: 97.7 F (36.5 C)  SpO2: 99%   Filed Weights   05/31/23 1022  Weight: 148 lb 8 oz (67.4 kg)      LABORATORY DATA:  I have reviewed the data as listed    Latest Ref Rng & Units 05/31/2023   10:05 AM 05/10/2023   10:00 AM 04/19/2023    9:59 AM  CMP  Glucose 70 - 99 mg/dL 742  595  638   BUN 8 - 23 mg/dL 21  20  21    Creatinine 0.44 - 1.00 mg/dL 7.56  4.33  2.95   Sodium 135 - 145 mmol/L 138  138  138   Potassium 3.5 - 5.1 mmol/L 4.1  4.1  4.2   Chloride 98 - 111 mmol/L 103  103  105   CO2 22 - 32 mmol/L 27   27  23    Calcium 8.9 - 10.3 mg/dL 9.4  9.6  9.5   Total Protein 6.5 - 8.1 g/dL 7.4  7.5  7.6   Total Bilirubin 0.3 - 1.2 mg/dL 0.4  0.5  0.3   Alkaline Phos 38 - 126 U/L 46  40  46   AST 15 - 41 U/L 14  21  17    ALT 0 - 44 U/L 13  19  15      Lab Results  Component Value Date   WBC 5.5 05/31/2023   HGB 11.0 (L) 05/31/2023   HCT 31.8 (L)  05/31/2023   MCV 93.0 05/31/2023   PLT 237 05/31/2023   NEUTROABS 2.8 05/31/2023    ASSESSMENT & PLAN:  Malignant neoplasm of upper-outer quadrant of right breast in female, estrogen receptor positive (HCC) 10/14/2022:Right mastectomy: High-grade IDC 3.4 cm with focal sarcomatoid features (metaplastic) 3.4 cm grade 3 with DCIS, lymphovascular invasion present, focal perineural invasion present, margins negative ER 0%, PR 0%, HER2 1+ (repeat prognostic panel was triple negative)   Treatment plan: adjuvant chemotherapy with CMF x 6 cycles started 11/12/2022 (held for wound infection, resumed 03/29/2023) 2. no role of antiestrogen therapy since her repeat prognostic panel was triple negative -------------------------------------------------------------------------------------------------------------------------------------------- Current treatment: Cycle 5 CMF  Chemo toxicities: Hospitalization 11/21/2022-11/26/2022: Cellulitis with neutropenic fever Fatigue: Persistent Mouth sores: Sent a prescription for sodium chloride sodium bicarbonate solution for swish and spit.   Reduced the dosage of chemotherapy with cycle 3. Patient's wound has healed  She tolerated the lower dosage of chemo extremely well.   Return to clinic in 3 weeks for her final chemotherapy. After that she can be followed by Mardella Layman for survivorship care plan visit and then we can see her annually.  No role of antiestrogen therapy.   Oral Ulcers Persistent oral ulcers, possibly related to chemotherapy. Current treatment provides temporary relief. -Print and provide prescription for  Sodium Chloride 5-hour mouth rinse, to be used as needed for mouth sores.  Bladder Issues Frequent nocturia disrupting sleep. Plan for Botox injection post-chemotherapy. -Continue current management plan and proceed with Botox injection after completion of chemotherapy.     No orders of the defined types were placed in this encounter.  The patient has a good understanding of the overall plan. she agrees with it. she will call with any problems that may develop before the next visit here. Total time spent: 30 mins including face to face time and time spent for planning, charting and co-ordination of care   Tamsen Meek, MD 05/31/23

## 2023-05-31 NOTE — Patient Instructions (Signed)
[place] [facility]  Discharge Instructions: Thank you for choosing [facility] to provide your oncology and hematology care.   If you have a lab appointment with the Cancer Center, please go directly to the Cancer Center and check in at the registration area.   Wear comfortable clothing and clothing appropriate for easy access to any Portacath or PICC line.   We strive to give you quality time with your provider. You may need to reschedule your appointment if you arrive late (15 or more minutes).  Arriving late affects you and other patients whose appointments are after yours.  Also, if you miss three or more appointments without notifying the office, you may be dismissed from the clinic at the provider's discretion.      For prescription refill requests, have your pharmacy contact our office and allow 72 hours for refills to be completed.    Today you received the following chemotherapy and/or immunotherapy agents: Cytoxan, Methotrexate, Fluorouracil.       To help prevent nausea and vomiting after your treatment, we encourage you to take your nausea medication as directed.  BELOW ARE SYMPTOMS THAT SHOULD BE REPORTED IMMEDIATELY: *FEVER GREATER THAN 100.4 F (38 C) OR HIGHER *CHILLS OR SWEATING *NAUSEA AND VOMITING THAT IS NOT CONTROLLED WITH YOUR NAUSEA MEDICATION *UNUSUAL SHORTNESS OF BREATH *UNUSUAL BRUISING OR BLEEDING *URINARY PROBLEMS (pain or burning when urinating, or frequent urination) *BOWEL PROBLEMS (unusual diarrhea, constipation, pain near the anus) TENDERNESS IN MOUTH AND THROAT WITH OR WITHOUT PRESENCE OF ULCERS (sore throat, sores in mouth, or a toothache) UNUSUAL RASH, SWELLING OR PAIN  UNUSUAL VAGINAL DISCHARGE OR ITCHING   Items with * indicate a potential emergency and should be followed up as soon as possible or go to the Emergency Department if any problems should occur.  Please show the CHEMOTHERAPY ALERT CARD or  IMMUNOTHERAPY ALERT CARD at check-in to the Emergency Department and triage nurse.  Should you have questions after your visit or need to cancel or reschedule your appointment, please contact [place] [facility]  Dept: [phone]  and follow the prompts.  Office hours are 8:00 a.m. to 4:30 p.m. Monday - Friday. Please note that voicemails left after 4:00 p.m. may not be returned until the following business day.  We are closed weekends and major holidays. You have access to a nurse at all times for urgent questions. Please call the main number to the clinic Dept: [phone] and follow the prompts.   For any non-urgent questions, you may also contact your provider using MyChart. We now offer e-Visits for anyone 73 and older to request care online for non-urgent symptoms. For details visit [email or web address].   Also download the MyChart app! Go to the app store, search "MyChart", open the app, select [place], and log in with your MyChart username and password.

## 2023-05-31 NOTE — Assessment & Plan Note (Addendum)
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 no role of antiestrogen therapy since her repeat prognostic panel was triple negative -------------------------------------------------------------------------------------------------------------------------------------------- Current treatment: Cycle 5 CMF  Chemo toxicities: Hospitalization 11/21/2022-11/26/2022: Cellulitis with neutropenic fever Fatigue: Persistent Mouth sores: Sent a prescription for sodium chloride sodium bicarbonate solution for swish and spit.   Reduced the dosage of chemotherapy with cycle 3. Patient's wound has healed  She tolerated the lower dosage of chemo extremely well.   Return to clinic in 3 weeks for her final chemotherapy. After that she can be followed by Mardella Layman for survivorship care plan visit and then we can see her annually.  No role of antiestrogen therapy.

## 2023-06-10 ENCOUNTER — Other Ambulatory Visit: Payer: Self-pay | Admitting: *Deleted

## 2023-06-10 ENCOUNTER — Other Ambulatory Visit: Payer: Self-pay | Admitting: Hematology and Oncology

## 2023-06-10 MED ORDER — MORPHINE SULFATE (CONCENTRATE) 10 MG/0.5ML PO SOLN: 20.0000 mg | Freq: Four times a day (QID) | ORAL | 0 refills | Status: DC | PRN

## 2023-06-10 MED ORDER — MORPHINE SULFATE (CONCENTRATE) 10 MG/0.5ML PO SOLN
20.0000 mg | Freq: Four times a day (QID) | ORAL | 0 refills | Status: DC | PRN
Start: 2023-06-10 — End: 2023-06-10

## 2023-06-10 MED ORDER — FLUCONAZOLE 10 MG/ML PO SUSR: 100.0000 mg | Freq: Every day | ORAL | 0 refills | Status: DC

## 2023-06-10 NOTE — Progress Notes (Signed)
Received call from pt with complaint of ongoing mouth sores despite magic mouth wash, saline rinse, and Valtrex.  MD sending in prescription for liquid morphine as well as liquid diflucan.  Pt educated to contact our office on Monday if symptoms do not improve.  Pt verbalized understanding.

## 2023-06-10 NOTE — Progress Notes (Signed)
Severe mouth sores causing intractable pain: She currently has Magic mouthwash sodium bicarbonate and in spite of that the mouth sores are not getting better. I will send for morphine sulfate 20 mg liquid to help ease the pain in the mouth.

## 2023-06-20 MED FILL — Fosaprepitant Dimeglumine For IV Infusion 150 MG (Base Eq): INTRAVENOUS | Qty: 5 | Status: AC

## 2023-06-21 ENCOUNTER — Inpatient Hospital Stay: Payer: Medicare Other | Attending: Hematology and Oncology

## 2023-06-21 ENCOUNTER — Encounter: Payer: Self-pay | Admitting: *Deleted

## 2023-06-21 ENCOUNTER — Encounter: Payer: Self-pay | Admitting: Adult Health

## 2023-06-21 ENCOUNTER — Inpatient Hospital Stay (HOSPITAL_BASED_OUTPATIENT_CLINIC_OR_DEPARTMENT_OTHER): Payer: Medicare Other | Admitting: Adult Health

## 2023-06-21 ENCOUNTER — Inpatient Hospital Stay: Payer: Medicare Other

## 2023-06-21 VITALS — BP 117/60 | HR 81 | Temp 97.4°F | Resp 18 | Ht 67.0 in | Wt 148.5 lb

## 2023-06-21 DIAGNOSIS — Z87891 Personal history of nicotine dependence: Secondary | ICD-10-CM | POA: Insufficient documentation

## 2023-06-21 DIAGNOSIS — Z923 Personal history of irradiation: Secondary | ICD-10-CM | POA: Insufficient documentation

## 2023-06-21 DIAGNOSIS — R5383 Other fatigue: Secondary | ICD-10-CM | POA: Diagnosis not present

## 2023-06-21 DIAGNOSIS — I252 Old myocardial infarction: Secondary | ICD-10-CM | POA: Diagnosis not present

## 2023-06-21 DIAGNOSIS — Z170 Estrogen receptor positive status [ER+]: Secondary | ICD-10-CM

## 2023-06-21 DIAGNOSIS — Z95828 Presence of other vascular implants and grafts: Secondary | ICD-10-CM

## 2023-06-21 DIAGNOSIS — C50411 Malignant neoplasm of upper-outer quadrant of right female breast: Secondary | ICD-10-CM | POA: Insufficient documentation

## 2023-06-21 DIAGNOSIS — Z79899 Other long term (current) drug therapy: Secondary | ICD-10-CM | POA: Insufficient documentation

## 2023-06-21 DIAGNOSIS — E118 Type 2 diabetes mellitus with unspecified complications: Secondary | ICD-10-CM | POA: Insufficient documentation

## 2023-06-21 DIAGNOSIS — Z9011 Acquired absence of right breast and nipple: Secondary | ICD-10-CM | POA: Insufficient documentation

## 2023-06-21 DIAGNOSIS — Z5111 Encounter for antineoplastic chemotherapy: Secondary | ICD-10-CM | POA: Diagnosis not present

## 2023-06-21 LAB — CBC WITH DIFFERENTIAL (CANCER CENTER ONLY)
Abs Immature Granulocytes: 0.24 10*3/uL — ABNORMAL HIGH (ref 0.00–0.07)
Basophils Absolute: 0 10*3/uL (ref 0.0–0.1)
Basophils Relative: 1 %
Eosinophils Absolute: 0.3 10*3/uL (ref 0.0–0.5)
Eosinophils Relative: 5 %
HCT: 32.6 % — ABNORMAL LOW (ref 36.0–46.0)
Hemoglobin: 10.6 g/dL — ABNORMAL LOW (ref 12.0–15.0)
Immature Granulocytes: 4 %
Lymphocytes Relative: 16 %
Lymphs Abs: 0.9 10*3/uL (ref 0.7–4.0)
MCH: 31.4 pg (ref 26.0–34.0)
MCHC: 32.5 g/dL (ref 30.0–36.0)
MCV: 96.4 fL (ref 80.0–100.0)
Monocytes Absolute: 1.1 10*3/uL — ABNORMAL HIGH (ref 0.1–1.0)
Monocytes Relative: 19 %
Neutro Abs: 3.2 10*3/uL (ref 1.7–7.7)
Neutrophils Relative %: 55 %
Platelet Count: 331 10*3/uL (ref 150–400)
RBC: 3.38 MIL/uL — ABNORMAL LOW (ref 3.87–5.11)
RDW: 15.6 % — ABNORMAL HIGH (ref 11.5–15.5)
WBC Count: 5.9 10*3/uL (ref 4.0–10.5)
nRBC: 0 % (ref 0.0–0.2)

## 2023-06-21 LAB — CMP (CANCER CENTER ONLY)
ALT: 12 U/L (ref 0–44)
AST: 15 U/L (ref 15–41)
Albumin: 4.3 g/dL (ref 3.5–5.0)
Alkaline Phosphatase: 49 U/L (ref 38–126)
Anion gap: 8 (ref 5–15)
BUN: 22 mg/dL (ref 8–23)
CO2: 26 mmol/L (ref 22–32)
Calcium: 9.6 mg/dL (ref 8.9–10.3)
Chloride: 103 mmol/L (ref 98–111)
Creatinine: 0.96 mg/dL (ref 0.44–1.00)
GFR, Estimated: 60 mL/min — ABNORMAL LOW (ref >60)
Glucose, Bld: 143 mg/dL — ABNORMAL HIGH (ref 70–99)
Potassium: 4.4 mmol/L (ref 3.5–5.1)
Sodium: 137 mmol/L (ref 135–145)
Total Bilirubin: 0.3 mg/dL (ref 0.3–1.2)
Total Protein: 7.4 g/dL (ref 6.5–8.1)

## 2023-06-21 MED ORDER — METHOTREXATE SODIUM CHEMO INJECTION (PF) 50 MG/2ML
40.0000 mg/m2 | Freq: Once | INTRAMUSCULAR | Status: AC
  Administered 2023-06-21: 72.75 mg via INTRAVENOUS
  Filled 2023-06-21: qty 2.91

## 2023-06-21 MED ORDER — SODIUM CHLORIDE 0.9% FLUSH
10.0000 mL | Freq: Once | INTRAVENOUS | Status: AC
  Administered 2023-06-21: 10 mL

## 2023-06-21 MED ORDER — FLUCONAZOLE 10 MG/ML PO SUSR: 100.0000 mg | Freq: Every day | ORAL | 0 refills | Status: DC

## 2023-06-21 MED ORDER — SODIUM CHLORIDE 0.9 % IV SOLN: Freq: Once | INTRAVENOUS | Status: AC

## 2023-06-21 MED ORDER — SODIUM CHLORIDE 0.9 % IV SOLN
900.0000 mg | Freq: Once | INTRAVENOUS | Status: AC
  Administered 2023-06-21: 900 mg via INTRAVENOUS
  Filled 2023-06-21: qty 45

## 2023-06-21 MED ORDER — DEXAMETHASONE SODIUM PHOSPHATE 10 MG/ML IJ SOLN
4.0000 mg | Freq: Once | INTRAMUSCULAR | Status: AC
  Administered 2023-06-21: 4 mg via INTRAVENOUS
  Filled 2023-06-21: qty 1

## 2023-06-21 MED ORDER — FLUOROURACIL CHEMO INJECTION 2.5 GM/50ML
400.0000 mg/m2 | Freq: Once | INTRAVENOUS | Status: AC
  Administered 2023-06-21: 750 mg via INTRAVENOUS
  Filled 2023-06-21: qty 15

## 2023-06-21 MED ORDER — SODIUM CHLORIDE 0.9 % IV SOLN
150.0000 mg | Freq: Once | INTRAVENOUS | Status: AC
  Administered 2023-06-21: 150 mg via INTRAVENOUS
  Filled 2023-06-21: qty 150

## 2023-06-21 MED ORDER — SODIUM CHLORIDE 0.9% FLUSH
10.0000 mL | INTRAVENOUS | Status: DC | PRN
  Administered 2023-06-21: 10 mL

## 2023-06-21 MED ORDER — HEPARIN SOD (PORK) LOCK FLUSH 100 UNIT/ML IV SOLN
500.0000 [IU] | Freq: Once | INTRAVENOUS | Status: AC | PRN
  Administered 2023-06-21: 500 [IU]

## 2023-06-21 MED ORDER — PALONOSETRON HCL INJECTION 0.25 MG/5ML
0.2500 mg | Freq: Once | INTRAVENOUS | Status: AC
  Administered 2023-06-21: 0.25 mg via INTRAVENOUS
  Filled 2023-06-21: qty 5

## 2023-06-21 NOTE — Assessment & Plan Note (Signed)
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 no role of antiestrogen therapy since her repeat prognostic panel was triple negative -------------------------------------------------------------------------------------------------------------------------------------------- Current treatment: Cycle 5 CMF (her second cycle was skipped therefore getting the count of her chemotherapy cycles off.  Today is indeed cycle 5 if you count them) Chemo toxicities: Hospitalization 11/21/2022-11/26/2022: Cellulitis with neutropenic fever Fatigue: Persistent, managed with energy conservation Mouth sores: Continue with Valtrex, Magic mouthwash, and fluconazole.   Reduced the dosage of chemotherapy with cycle 3. Patient's wound has healed  She tolerated the lower dosage of chemo extremely well.  Her labs are stable and she will proceed with treatment today   Return to clinic in 3 weeks for her final chemotherapy.  I we will review her case with Dr. Pamelia Hoit upon his return since her chemotherapy count appears to have gotten off cycle to complete 6 cycles of therapy on November 5.  We will go ahead and schedule for this sixth cycle.

## 2023-06-21 NOTE — Progress Notes (Signed)
[facility] Cancer Follow up:    Karina Greathouse, MD [address] [place] [address]   DIAGNOSIS:  Cancer Staging  Malignant neoplasm of upper-outer quadrant of right breast in female, estrogen receptor positive (HCC) Staging form: Breast, AJCC 8th Edition - Pathologic stage from 10/14/2022: Stage IIA (pT2, pN0, cM0, G3, ER+, PR-, HER2-) - Signed by Loa Socks, NP on 05/10/2023 Histologic grading system: 3 grade system   SUMMARY OF ONCOLOGIC HISTORY: Oncology History  Malignant neoplasm of upper-outer quadrant of right breast in female, estrogen receptor positive (HCC)  08/27/2022 Initial Diagnosis   Screening mammogram detected right breast mass by ultrasound measured 2.3 cm biopsy revealed grade 3 IDC ER 50%, PR 0%, Ki-67 90%, HER2 1+ negative   10/14/2022 Surgery   Right mastectomy: 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 female returns for f/u prior to receiving her 5th cycle of CMF chemotherapy.  She is tolerating treatment moderately well.  She notes a couple of ulcerations underneath her tongue and she was started on fluconazole.  She also has Magic mouthwash that she is using along with Valtrex twice daily.  She denies any significant issues other than the tongue concerns.   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    Depression 12/06/2022   Wound dehiscence 12/06/2022   Neutropenic fever (HCC) 11/22/2022   UTI (urinary tract infection) 11/22/2022   Tachycardia 11/22/2022   AKI (acute kidney injury) (HCC) 11/22/2022   Hypokalemia 11/22/2022   Severe neutropenia (HCC) 11/22/2022   Port-A-Cath in place 11/19/2022   Breast cancer of upper-outer quadrant of right female breast (HCC) 10/14/2022   Hyperlipemia 10/14/2022   Malignant neoplasm of upper-outer quadrant of right breast in female, estrogen receptor positive (HCC) 09/15/2022   History of MI (myocardial infarction) 09/13/2022   BPPV (benign paroxysmal positional vertigo) 05/01/2019   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  MEDICAL HISTORY: Past Medical History:  Diagnosis Date   Allergic rhinitis, seasonal    Arthritis    hands   Asthma    Years ago due to a cat. 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    GERD (gastroesophageal reflux disease)    History of benign neoplasm of tongue    per pt has had several bx's of lesions   History of cancer chemotherapy 1997   right breast cancer   History of external beam radiation therapy 1997   right breast cancer   History of iron deficiency  anemia    History of non-ST elevation myocardial infarction (NSTEMI) 12/14/2008   History of right breast cancer 1997   s/p  right partial masectomy node dissection's,  completed chemo and radiation same year ,   no recurrence since   Hyperlipidemia    Mood disorder (HCC)    Myocardial infarction (HCC) 2010   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   OAB (overactive bladder)    OSA (obstructive sleep apnea) 2016   sleep study in epic 10-04-2014, AHI 22.7/ hr;  (06-17-2022  pt stated has not used dental appliance in few years, does not fit )   Peripheral neuropathy    feet   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/2008   x2  to proximal and mid LAD   Urinary incontinence, mixed    urologist--- dr pace   Wears glasses    Wears hearing aid in both ears     SURGICAL HISTORY: Past Surgical History:  Procedure Laterality Date   BOTOX INJECTION N/A 06/22/2022   Procedure: ABORTED BOTOX INJECTION 75 UNITS;  Surgeon: Noel Christmas, MD;  Location: [facility];  Service: Urology;  Laterality: N/A;   BOTOX INJECTION N/A 07/20/2022   Procedure: BOTOX INJECTION;  Surgeon: Noel Christmas, MD;  Location: [facility];  Service: Urology;  Laterality: N/A;   BREAST BIOPSY Right 08/27/2022   [place] RT BREAST BX W LOC DEV 1ST LESION IMG BX SPEC US GUIDE 08/27/2022 [facility] MAMMOGRAPHY   CATARACT EXTRACTION W/ INTRAOCULAR LENS IMPLANT Bilateral 2014   CORONARY ANGIOPLASTY WITH STENT PLACEMENT  12/16/2008   @[facility]   by dr berry;   PCI and stenting to proximal and mid LAD (DES) w/ residual D1 80%  nonobstructive disease involving RCA/ CFx,  ef 50%   CYSTOSCOPY N/A 06/22/2022   Procedure: CYSTOSCOPY, BLADDER BIOPSY, AND FULGERATION;  Surgeon: Noel Christmas, MD;  Location: [facility];  Service: Urology;  Laterality: N/A;  30 MINS   CYSTOSCOPY N/A 07/20/2022   Procedure: CYSTOSCOPY, BLADDER FULGERATION;  Surgeon: Noel Christmas, MD;   Location: [facility];  Service: Urology;  Laterality: N/A;  30 MINS   EXCISION OF BREAST BIOPSY Right 1995   IR CV LINE INJECTION  04/21/2023   IR IMAGING GUIDED PORT INSERTION  05/04/2023   IR REMOVAL TUN ACCESS W/ PORT W/O FL MOD SED  05/04/2023   MASTECTOMY PARTIAL / LUMPECTOMY W/ AXILLARY LYMPHADENECTOMY Right 1997   PORTACATH PLACEMENT N/A 10/14/2022   Procedure: PORT PLACEMENT;  Surgeon: Almond Lint, MD;  Location: [facility];  Service: General;  Laterality: N/A;   TOTAL MASTECTOMY Right 10/14/2022   Procedure: RIGHT MASTECTOMY;  Surgeon: Almond Lint, MD;  Location: [facility];  Service: General;  Laterality: Right;  PEC BLOCK   TUBAL LIGATION Bilateral 1977    SOCIAL HISTORY: Social History   Socioeconomic History   Marital status: Married    Spouse name: Not on file   Number of children: 1   Years of education: Assoc  Highest education level: Not on file  Occupational History    Comment: retired  Tobacco Use   Smoking status: Former    Current packs/day: 0.00    Average packs/day: 1 pack/day for 20.0 years (20.0 ttl pk-yrs)    Types: Cigarettes    Start date: 66    Quit date: 1991    Years since quitting: 33.8   Smokeless tobacco: Never  Vaping Use   Vaping status: Never Used  Substance and Sexual Activity   Alcohol use: Yes    Alcohol/week: 7.0 standard drinks of alcohol    Types: 7 Glasses of wine per week    Comment: Wine prior to dinner   Drug use: Never   Sexual activity: Not on file  Other Topics Concern   Not on file  Social History Narrative   3 caffeine drinks a day    Social Determinants of Health   Financial Resource Strain: Not on file  Food Insecurity: No Food Insecurity (11/22/2022)   Hunger Vital Sign    Worried About Running Out of Food in the Last Year: Never true    Ran Out of Food in the Last Year: Never true  Transportation Needs: No Transportation Needs (11/22/2022)   PRAPARE - Administrator, Civil Service  (Medical): No    Lack of Transportation (Non-Medical): No  Physical Activity: Not on file  Stress: Not on file  Social Connections: Not on file  Intimate Partner Violence: Not At Risk (11/22/2022)   Humiliation, Afraid, Rape, and Kick questionnaire    Fear of Current or Ex-Partner: No    Emotionally Abused: No    Physically Abused: No    Sexually Abused: No    FAMILY HISTORY: Family History  Problem Relation Age of Onset   Osteoporosis Mother    Breast cancer Mother 64   Heart Problems Mother    Other Father        MI   CVA Father    Asthma Father    Diabetes Mellitus II Father    Hypertension Sister    Ovarian cancer Sister 60    Review of Systems  Constitutional:  Positive for fatigue. Negative for appetite change, chills, fever and unexpected weight change.  HENT:   Positive for mouth sores. Negative for hearing loss, lump/mass and trouble swallowing.   Eyes:  Negative for eye problems and icterus.  Respiratory:  Negative for chest tightness, cough and shortness of breath.   Cardiovascular:  Negative for chest pain, leg swelling and palpitations.  Gastrointestinal:  Negative for abdominal distention, abdominal pain, constipation, diarrhea, nausea and vomiting.  Endocrine: Negative for hot flashes.  Genitourinary:  Negative for difficulty urinating.   Musculoskeletal:  Negative for arthralgias.  Skin:  Negative for itching and rash.  Neurological:  Negative for dizziness, extremity weakness, headaches and numbness.  Hematological:  Negative for adenopathy. Does not bruise/bleed easily.  Psychiatric/Behavioral:  Negative for depression. The patient is not nervous/anxious.       PHYSICAL EXAMINATION   Onc Performance Status - 06/21/23 1200       KPS SCALE   KPS % SCORE Able to carry on normal activity, minor s/s of disease             Vitals:   06/21/23 1200  BP: 117/60  Pulse: 81  Resp: 18  Temp: (!) 97.4 F (36.3 C)  SpO2: 100%    Physical  Exam Constitutional:      General: She is not in  acute distress.    Appearance: Normal appearance. She is not toxic-appearing.  HENT:     Head: Normocephalic and atraumatic.     Mouth/Throat:     Mouth: Mucous membranes are moist.     Pharynx: Oropharynx is clear. Posterior oropharyngeal erythema present. No oropharyngeal exudate.     Comments: Ulcerations again noted underneath her tongue bilaterally. Eyes:     General: No scleral icterus. Cardiovascular:     Rate and Rhythm: Normal rate and regular rhythm.     Pulses: Normal pulses.     Heart sounds: Normal heart sounds.  Pulmonary:     Effort: Pulmonary effort is normal.     Breath sounds: Normal breath sounds.  Abdominal:     General: Abdomen is flat. Bowel sounds are normal. There is no distension.     Palpations: Abdomen is soft.     Tenderness: There is no abdominal tenderness.  Musculoskeletal:        General: No swelling.     Cervical back: Neck supple.  Lymphadenopathy:     Cervical: No cervical adenopathy.  Skin:    General: Skin is warm and dry.     Findings: No rash.  Neurological:     General: No focal deficit present.     Mental Status: She is alert.  Psychiatric:        Mood and Affect: Mood normal.        Behavior: Behavior normal.     LABORATORY DATA:  CBC    Component Value Date/Time   WBC 5.9 06/21/2023 1119   WBC 15.2 (H) 11/26/2022 0655   RBC 3.38 (L) 06/21/2023 1119   HGB 10.6 (L) 06/21/2023 1119   HCT 32.6 (L) 06/21/2023 1119   PLT 331 06/21/2023 1119   MCV 96.4 06/21/2023 1119   MCH 31.4 06/21/2023 1119   MCHC 32.5 06/21/2023 1119   RDW 15.6 (H) 06/21/2023 1119   LYMPHSABS 0.9 06/21/2023 1119   MONOABS 1.1 (H) 06/21/2023 1119   EOSABS 0.3 06/21/2023 1119   BASOSABS 0.0 06/21/2023 1119    CMP     Component Value Date/Time   NA 137 06/21/2023 1119   K 4.4 06/21/2023 1119   CL 103 06/21/2023 1119   CO2 26 06/21/2023 1119   GLUCOSE 143 (H) 06/21/2023 1119   BUN 22 06/21/2023  1119   CREATININE 0.96 06/21/2023 1119   CALCIUM 9.6 06/21/2023 1119   PROT 7.4 06/21/2023 1119   ALBUMIN 4.3 06/21/2023 1119   AST 15 06/21/2023 1119   ALT 12 06/21/2023 1119   ALKPHOS 49 06/21/2023 1119   BILITOT 0.3 06/21/2023 1119   GFRNONAA 60 (L) 06/21/2023 1119   GFRAA  12/18/2008 0505    >60        The eGFR has been calculated using the MDRD equation. This calculation has not been validated in all clinical situations. eGFR's persistently <60 mL/min signify possible Chronic Kidney Disease.       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 no role of antiestrogen therapy since her repeat prognostic panel was triple negative --------------------------------------------------------------------------------------------------------------------------------------------  Current treatment: Cycle 5 CMF (her second cycle was skipped therefore getting the count of her chemotherapy cycles off.  Today is indeed cycle 5 if you count them) Chemo toxicities: Hospitalization 11/21/2022-11/26/2022: Cellulitis with neutropenic fever Fatigue: Persistent, managed with energy conservation Mouth sores: Continue with Valtrex, Magic mouthwash, and fluconazole.   Reduced the dosage of chemotherapy with cycle 3. Patient's wound has healed  She tolerated the lower dosage of chemo extremely well.  Her labs are stable and she will proceed with treatment today   Return to clinic in 3 weeks for her final chemotherapy.  I  we will review her case with Dr. Pamelia Hoit upon his return since her chemotherapy count appears to have gotten off cycle to complete 6 cycles of therapy on November 5.  We will go ahead and schedule for this sixth cycle.  All questions were answered. The patient knows to call the clinic with any problems, questions or concerns. We can certainly see the patient much sooner if necessary.  Total encounter time:20 minutes*in face-to-face visit time, chart review, lab review, care coordination, order entry, and documentation of the encounter time.    Lillard Anes, NP 06/21/23 12:40 PM Medical Oncology and Hematology [facility] [address] [place], [place] [address] Tel. [phone]    Fax. [phone]  *Total Encounter Time as defined by the Centers for Medicare and Medicaid Services includes, in addition to the face-to-face time of a patient visit (documented in the note above) non-face-to-face time: obtaining and reviewing outside history, ordering and reviewing medications, tests or procedures, care coordination (communications with other health care professionals or caregivers) and documentation in the medical record.

## 2023-06-21 NOTE — Patient Instructions (Signed)
[place] [facility]  Discharge Instructions: Thank you for choosing [facility] to provide your oncology and hematology care.   If you have a lab appointment with the Cancer Center, please go directly to the Cancer Center and check in at the registration area.   Wear comfortable clothing and clothing appropriate for easy access to any Portacath or PICC line.   We strive to give you quality time with your provider. You may need to reschedule your appointment if you arrive late (15 or more minutes).  Arriving late affects you and other patients whose appointments are after yours.  Also, if you miss three or more appointments without notifying the office, you may be dismissed from the clinic at the provider's discretion.      For prescription refill requests, have your pharmacy contact our office and allow 72 hours for refills to be completed.    Today you received the following chemotherapy and/or immunotherapy agents: Cytoxan, Methotrexate, Fluorouracil.       To help prevent nausea and vomiting after your treatment, we encourage you to take your nausea medication as directed.  BELOW ARE SYMPTOMS THAT SHOULD BE REPORTED IMMEDIATELY: *FEVER GREATER THAN 100.4 F (38 C) OR HIGHER *CHILLS OR SWEATING *NAUSEA AND VOMITING THAT IS NOT CONTROLLED WITH YOUR NAUSEA MEDICATION *UNUSUAL SHORTNESS OF BREATH *UNUSUAL BRUISING OR BLEEDING *URINARY PROBLEMS (pain or burning when urinating, or frequent urination) *BOWEL PROBLEMS (unusual diarrhea, constipation, pain near the anus) TENDERNESS IN MOUTH AND THROAT WITH OR WITHOUT PRESENCE OF ULCERS (sore throat, sores in mouth, or a toothache) UNUSUAL RASH, SWELLING OR PAIN  UNUSUAL VAGINAL DISCHARGE OR ITCHING   Items with * indicate a potential emergency and should be followed up as soon as possible or go to the Emergency Department if any problems should occur.  Please show the CHEMOTHERAPY ALERT CARD or  IMMUNOTHERAPY ALERT CARD at check-in to the Emergency Department and triage nurse.  Should you have questions after your visit or need to cancel or reschedule your appointment, please contact [place] [facility]  Dept: [phone]  and follow the prompts.  Office hours are 8:00 a.m. to 4:30 p.m. Monday - Friday. Please note that voicemails left after 4:00 p.m. may not be returned until the following business day.  We are closed weekends and major holidays. You have access to a nurse at all times for urgent questions. Please call the main number to the clinic Dept: [phone] and follow the prompts.   For any non-urgent questions, you may also contact your provider using MyChart. We now offer e-Visits for anyone 17 and older to request care online for non-urgent symptoms. For details visit [email or web address].   Also download the MyChart app! Go to the app store, search "MyChart", open the app, select , and log in with your MyChart username and password.

## 2023-06-23 ENCOUNTER — Ambulatory Visit: Payer: Self-pay | Admitting: Cardiology

## 2023-06-28 ENCOUNTER — Telehealth: Payer: Self-pay | Admitting: Hematology and Oncology

## 2023-06-28 ENCOUNTER — Encounter: Payer: Self-pay | Admitting: *Deleted

## 2023-06-28 NOTE — Telephone Encounter (Signed)
 Left patient a vm regarding upcoming appointment

## 2023-06-29 ENCOUNTER — Other Ambulatory Visit: Payer: Self-pay

## 2023-06-29 ENCOUNTER — Other Ambulatory Visit: Payer: Self-pay | Admitting: *Deleted

## 2023-06-29 MED ORDER — LIDOCAINE VISCOUS HCL 2 % MT SOLN: 5.0000 mL | Freq: Four times a day (QID) | OROMUCOSAL | 1 refills | Status: DC | PRN

## 2023-07-04 ENCOUNTER — Encounter: Payer: Self-pay | Admitting: Hematology and Oncology

## 2023-07-12 MED FILL — Fosaprepitant Dimeglumine For IV Infusion 150 MG (Base Eq): INTRAVENOUS | Qty: 5 | Status: AC

## 2023-07-13 ENCOUNTER — Inpatient Hospital Stay: Payer: Medicare Other | Attending: Hematology and Oncology

## 2023-07-13 ENCOUNTER — Inpatient Hospital Stay: Payer: Medicare Other

## 2023-07-13 ENCOUNTER — Inpatient Hospital Stay (HOSPITAL_BASED_OUTPATIENT_CLINIC_OR_DEPARTMENT_OTHER): Payer: Medicare Other | Admitting: Adult Health

## 2023-07-13 ENCOUNTER — Encounter: Payer: Self-pay | Admitting: *Deleted

## 2023-07-13 ENCOUNTER — Encounter: Payer: Self-pay | Admitting: Adult Health

## 2023-07-13 DIAGNOSIS — Z5111 Encounter for antineoplastic chemotherapy: Secondary | ICD-10-CM | POA: Insufficient documentation

## 2023-07-13 DIAGNOSIS — Z170 Estrogen receptor positive status [ER+]: Secondary | ICD-10-CM | POA: Insufficient documentation

## 2023-07-13 DIAGNOSIS — R350 Frequency of micturition: Secondary | ICD-10-CM | POA: Insufficient documentation

## 2023-07-13 DIAGNOSIS — E118 Type 2 diabetes mellitus with unspecified complications: Secondary | ICD-10-CM | POA: Insufficient documentation

## 2023-07-13 DIAGNOSIS — Z87891 Personal history of nicotine dependence: Secondary | ICD-10-CM | POA: Diagnosis not present

## 2023-07-13 DIAGNOSIS — K1231 Oral mucositis (ulcerative) due to antineoplastic therapy: Secondary | ICD-10-CM | POA: Diagnosis not present

## 2023-07-13 DIAGNOSIS — C50411 Malignant neoplasm of upper-outer quadrant of right female breast: Secondary | ICD-10-CM | POA: Diagnosis not present

## 2023-07-13 DIAGNOSIS — Z923 Personal history of irradiation: Secondary | ICD-10-CM | POA: Diagnosis not present

## 2023-07-13 DIAGNOSIS — G4733 Obstructive sleep apnea (adult) (pediatric): Secondary | ICD-10-CM | POA: Diagnosis not present

## 2023-07-13 DIAGNOSIS — Z95828 Presence of other vascular implants and grafts: Secondary | ICD-10-CM

## 2023-07-13 DIAGNOSIS — Z8041 Family history of malignant neoplasm of ovary: Secondary | ICD-10-CM | POA: Diagnosis not present

## 2023-07-13 DIAGNOSIS — Z79899 Other long term (current) drug therapy: Secondary | ICD-10-CM | POA: Insufficient documentation

## 2023-07-13 DIAGNOSIS — R5383 Other fatigue: Secondary | ICD-10-CM | POA: Insufficient documentation

## 2023-07-13 DIAGNOSIS — Z803 Family history of malignant neoplasm of breast: Secondary | ICD-10-CM | POA: Insufficient documentation

## 2023-07-13 DIAGNOSIS — I252 Old myocardial infarction: Secondary | ICD-10-CM | POA: Insufficient documentation

## 2023-07-13 LAB — CBC WITH DIFFERENTIAL (CANCER CENTER ONLY)
Abs Immature Granulocytes: 0.2 10*3/uL — ABNORMAL HIGH (ref 0.00–0.07)
Basophils Absolute: 0.1 10*3/uL (ref 0.0–0.1)
Basophils Relative: 1 %
Eosinophils Absolute: 0.4 10*3/uL (ref 0.0–0.5)
Eosinophils Relative: 6 %
HCT: 31.6 % — ABNORMAL LOW (ref 36.0–46.0)
Hemoglobin: 10.4 g/dL — ABNORMAL LOW (ref 12.0–15.0)
Immature Granulocytes: 3 %
Lymphocytes Relative: 12 %
Lymphs Abs: 0.7 10*3/uL (ref 0.7–4.0)
MCH: 31.8 pg (ref 26.0–34.0)
MCHC: 32.9 g/dL (ref 30.0–36.0)
MCV: 96.6 fL (ref 80.0–100.0)
Monocytes Absolute: 0.9 10*3/uL (ref 0.1–1.0)
Monocytes Relative: 14 %
Neutro Abs: 3.9 10*3/uL (ref 1.7–7.7)
Neutrophils Relative %: 64 %
Platelet Count: 237 10*3/uL (ref 150–400)
RBC: 3.27 MIL/uL — ABNORMAL LOW (ref 3.87–5.11)
RDW: 15.5 % (ref 11.5–15.5)
WBC Count: 6.2 10*3/uL (ref 4.0–10.5)
nRBC: 0 % (ref 0.0–0.2)

## 2023-07-13 LAB — CMP (CANCER CENTER ONLY)
ALT: 11 U/L (ref 0–44)
AST: 13 U/L — ABNORMAL LOW (ref 15–41)
Albumin: 4.2 g/dL (ref 3.5–5.0)
Alkaline Phosphatase: 46 U/L (ref 38–126)
Anion gap: 9 (ref 5–15)
BUN: 20 mg/dL (ref 8–23)
CO2: 26 mmol/L (ref 22–32)
Calcium: 9.4 mg/dL (ref 8.9–10.3)
Chloride: 105 mmol/L (ref 98–111)
Creatinine: 0.89 mg/dL (ref 0.44–1.00)
GFR, Estimated: >60 mL/min (ref >60)
Glucose, Bld: 184 mg/dL — ABNORMAL HIGH (ref 70–99)
Potassium: 3.8 mmol/L (ref 3.5–5.1)
Sodium: 140 mmol/L (ref 135–145)
Total Bilirubin: 0.4 mg/dL (ref <1.2)
Total Protein: 7.2 g/dL (ref 6.5–8.1)

## 2023-07-13 MED ORDER — FLUCONAZOLE 10 MG/ML PO SUSR: 100.0000 mg | Freq: Every day | ORAL | 0 refills | Status: DC

## 2023-07-13 MED ORDER — HEPARIN SOD (PORK) LOCK FLUSH 100 UNIT/ML IV SOLN
500.0000 [IU] | Freq: Once | INTRAVENOUS | Status: AC | PRN
Start: 2023-07-13 — End: 2023-07-13
  Administered 2023-07-13: 500 [IU]

## 2023-07-13 MED ORDER — SODIUM CHLORIDE 0.9% FLUSH
10.0000 mL | INTRAVENOUS | Status: DC | PRN
  Administered 2023-07-13: 10 mL

## 2023-07-13 MED ORDER — SODIUM CHLORIDE 0.9 % IV SOLN
150.0000 mg | Freq: Once | INTRAVENOUS | Status: AC
  Administered 2023-07-13: 150 mg via INTRAVENOUS
  Filled 2023-07-13: qty 150

## 2023-07-13 MED ORDER — PALONOSETRON HCL INJECTION 0.25 MG/5ML
0.2500 mg | Freq: Once | INTRAVENOUS | Status: AC
  Administered 2023-07-13: 0.25 mg via INTRAVENOUS
  Filled 2023-07-13: qty 5

## 2023-07-13 MED ORDER — VALACYCLOVIR HCL 500 MG PO TABS: 500.0000 mg | ORAL_TABLET | Freq: Two times a day (BID) | ORAL | 0 refills | Status: DC

## 2023-07-13 MED ORDER — CYCLOPHOSPHAMIDE CHEMO INJECTION 1 GM
900.0000 mg | Freq: Once | INTRAMUSCULAR | Status: AC
  Administered 2023-07-13: 900 mg via INTRAVENOUS
  Filled 2023-07-13: qty 45

## 2023-07-13 MED ORDER — SODIUM CHLORIDE 0.9 % IV SOLN: Freq: Once | INTRAVENOUS | Status: AC

## 2023-07-13 MED ORDER — SODIUM CHLORIDE 0.9% FLUSH
10.0000 mL | Freq: Once | INTRAVENOUS | Status: AC
  Administered 2023-07-13: 10 mL

## 2023-07-13 MED ORDER — FLUOROURACIL CHEMO INJECTION 2.5 GM/50ML
400.0000 mg/m2 | Freq: Once | INTRAVENOUS | Status: AC
  Administered 2023-07-13: 750 mg via INTRAVENOUS
  Filled 2023-07-13: qty 15

## 2023-07-13 MED ORDER — METHOTREXATE SODIUM CHEMO INJECTION (PF) 50 MG/2ML
40.0000 mg/m2 | Freq: Once | INTRAMUSCULAR | Status: AC
Start: 2023-07-13 — End: 2023-07-13
  Administered 2023-07-13: 72.75 mg via INTRAVENOUS
  Filled 2023-07-13: qty 2.91

## 2023-07-13 MED ORDER — DEXAMETHASONE SODIUM PHOSPHATE 10 MG/ML IJ SOLN
4.0000 mg | Freq: Once | INTRAMUSCULAR | Status: AC
Start: 2023-07-13 — End: 2023-07-13
  Administered 2023-07-13: 4 mg via INTRAVENOUS
  Filled 2023-07-13: qty 1

## 2023-07-13 NOTE — Patient Instructions (Signed)
[facility] - A DEPT OF [facility]  Discharge Instructions: Thank you for choosing [facility] to provide your oncology and hematology care.   If you have a lab appointment with the Cancer Center, please go directly to the Cancer Center and check in at the registration area.   Wear comfortable clothing and clothing appropriate for easy access to any Portacath or PICC line.   We strive to give you quality time with your provider. You may need to reschedule your appointment if you arrive late (15 or more minutes).  Arriving late affects you and other patients whose appointments are after yours.  Also, if you miss three or more appointments without notifying the office, you may be dismissed from the clinic at the provider's discretion.      For prescription refill requests, have your pharmacy contact our office and allow 72 hours for refills to be completed.    Today you received the following chemotherapy and/or immunotherapy agents Cytoxan, Methotrexate, Fluorouracil.      To help prevent nausea and vomiting after your treatment, we encourage you to take your nausea medication as directed.  BELOW ARE SYMPTOMS THAT SHOULD BE REPORTED IMMEDIATELY: *FEVER GREATER THAN 100.4 F (38 C) OR HIGHER *CHILLS OR SWEATING *NAUSEA AND VOMITING THAT IS NOT CONTROLLED WITH YOUR NAUSEA MEDICATION *UNUSUAL SHORTNESS OF BREATH *UNUSUAL BRUISING OR BLEEDING *URINARY PROBLEMS (pain or burning when urinating, or frequent urination) *BOWEL PROBLEMS (unusual diarrhea, constipation, pain near the anus) TENDERNESS IN MOUTH AND THROAT WITH OR WITHOUT PRESENCE OF ULCERS (sore throat, sores in mouth, or a toothache) UNUSUAL RASH, SWELLING OR PAIN  UNUSUAL VAGINAL DISCHARGE OR ITCHING   Items with * indicate a potential emergency and should be followed up as soon as possible or go to the Emergency Department if any problems should occur.  Please show the CHEMOTHERAPY  ALERT CARD or IMMUNOTHERAPY ALERT CARD at check-in to the Emergency Department and triage nurse.  Should you have questions after your visit or need to cancel or reschedule your appointment, please contact [facility] - A DEPT OF [facility]  Dept: [phone]  and follow the prompts.  Office hours are 8:00 a.m. to 4:30 p.m. Monday - Friday. Please note that voicemails left after 4:00 p.m. may not be returned until the following business day.  We are closed weekends and major holidays. You have access to a nurse at all times for urgent questions. Please call the main number to the clinic Dept: [phone] and follow the prompts.   For any non-urgent questions, you may also contact your provider using MyChart. We now offer e-Visits for anyone 65 and older to request care online for non-urgent symptoms. For details visit [email or web address].   Also download the MyChart app! Go to the app store, search "MyChart", open the app, select [facility], and log in with your MyChart username and password.

## 2023-07-13 NOTE — Progress Notes (Signed)
[facility] Cancer Follow up:    Chilton Greathouse, MD [address] [place] [address]   DIAGNOSIS:  Cancer Staging  Malignant neoplasm of upper-outer quadrant of right breast in female, estrogen receptor positive (HCC) Staging form: Breast, AJCC 8th Edition - Pathologic stage from 10/14/2022: Stage IIA (pT2, pN0, cM0, G3, ER+, PR-, HER2-) - Signed by Loa Socks, NP on 05/10/2023 Histologic grading system: 3 grade system   SUMMARY OF ONCOLOGIC HISTORY: Oncology History  Malignant neoplasm of upper-outer quadrant of right breast in female, estrogen receptor positive (HCC)  08/27/2022 Initial Diagnosis   Screening mammogram detected right breast mass by ultrasound measured 2.3 cm biopsy revealed grade 3 IDC ER 50%, PR 0%, Ki-67 90%, HER2 1+ negative   10/14/2022 Surgery   Right mastectomy: High-grade IDC 3.4 cm with focal sarcomatoid features (metaplastic) 3.4 cm grade 3 with DCIS, lymphovascular invasion present, focal perineural invasion present, margins negative ER 50%, PR 0%, HER2 1+   10/14/2022 Cancer Staging   Staging form: Breast, AJCC 8th Edition - Pathologic stage from 10/14/2022: Stage IIA (pT2, pN0, cM0, G3, ER+, PR-, HER2-) - Signed by Loa Socks, NP on 05/10/2023 Histologic grading system: 3 grade system   11/12/2022 -  Chemotherapy   Patient is on Treatment Plan : BREAST Adjuvant CMF IV q21d       CURRENT THERAPY: CMF  INTERVAL HISTORY:  Discussed the use of AI scribe software for clinical note transcription with the patient, who gave verbal consent to proceed.  Karina Foster 82 y.o. female returns for f/u prior to receiving her sixth and final cycle of CMF.  She has been doing well other than experiencing oral ulcerations and thrush. They report associated throat and ear pain. The patient also mentions that the previously prescribed morphine sulfate was ineffective and had an unpleasant taste.  The patient has been experiencing  fatigue, which they attribute to their oral symptoms. They report a decrease in physical activity and an increase in alcohol consumption during this period. Despite this, they note a slight improvement in their overall condition over the past week.  The patient also reports a worsening bladder problem, which they describe as more bothersome than their current cancer treatment. They experience frequent urination, needing to find a bathroom every hour and a half, and this is disrupting their sleep.  The patient has a port, which was placed by radiology. They mention that it had to be switched from one side to the other due to malfunction. The patient is open to having the port removed at any time.   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   Depression 12/06/2022   Wound dehiscence 12/06/2022   Neutropenic fever (HCC) 11/22/2022   UTI (urinary tract infection) 11/22/2022   Tachycardia 11/22/2022   AKI (acute kidney injury) (HCC) 11/22/2022   Hypokalemia 11/22/2022   Severe neutropenia (HCC) 11/22/2022   Port-A-Cath in place 11/19/2022   Breast cancer of upper-outer quadrant of right female breast (HCC) 10/14/2022   Hyperlipemia 10/14/2022   Malignant neoplasm of upper-outer quadrant of right breast in female, estrogen receptor positive (HCC) 09/15/2022   History of MI (myocardial infarction) 09/13/2022   BPPV (benign paroxysmal positional vertigo) 05/01/2019   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  MEDICAL HISTORY: Past Medical History:  Diagnosis Date   Allergic rhinitis, seasonal    Arthritis    hands   Asthma    Years ago due to a cat. 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    GERD (gastroesophageal reflux disease)    History of benign neoplasm of tongue    per pt has had several bx's of lesions   History of cancer chemotherapy 1997   right breast cancer   History of external beam radiation therapy 1997   right breast cancer   History of iron deficiency anemia    History of non-ST elevation myocardial infarction (NSTEMI) 12/14/2008   History of right breast cancer 1997   s/p  right partial masectomy node dissection's,  completed chemo and radiation same year ,   no recurrence since   Hyperlipidemia    Mood disorder (HCC)    Myocardial infarction (HCC) 2010   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   OAB (overactive bladder)    OSA (obstructive sleep apnea) 2016   sleep study in epic 10-04-2014, AHI 22.7/ hr;  (06-17-2022  pt stated has not used dental appliance in few years, does not fit )   Peripheral neuropathy    feet   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/2008   x2  to proximal and mid LAD   Urinary incontinence, mixed    urologist--- dr pace   Wears glasses    Wears hearing aid in both ears     SURGICAL HISTORY: Past Surgical History:  Procedure Laterality Date   BOTOX INJECTION N/A 06/22/2022   Procedure: ABORTED BOTOX INJECTION 75 UNITS;  Surgeon: Noel Christmas, MD;  Location[facility];  Service: Urology;  Laterality: N/A;   BOTOX INJECTION N/A 07/20/2022   Procedure:  BOTOX INJECTION;  Surgeon: Noel Christmas, MD;  Location: [facility];  Service: Urology;  Laterality: N/A;   BREAST BIOPSY Right 08/27/2022   [place] RT BREAST BX W LOC DEV 1ST LESION IMG BX SPEC US GUIDE 08/27/2022 [facility] MAMMOGRAPHY   CATARACT EXTRACTION W/ INTRAOCULAR LENS IMPLANT Bilateral 2014   CORONARY ANGIOPLASTY WITH STENT PLACEMENT  12/16/2008   @[facility]   by dr berry;   PCI and stenting to proximal and mid LAD (DES) w/ residual D1 80%  nonobstructive disease involving RCA/ CFx,  ef 50%   CYSTOSCOPY N/A 06/22/2022   Procedure: CYSTOSCOPY, BLADDER BIOPSY, AND FULGERATION;  Surgeon: Noel Christmas, MD;  Location: [facility];  Service: Urology;  Laterality: N/A;  30 MINS   CYSTOSCOPY N/A 07/20/2022   Procedure: CYSTOSCOPY, BLADDER FULGERATION;  Surgeon: Noel Christmas, MD;  Location: [facility];  Service: Urology;  Laterality: N/A;  30 MINS   EXCISION OF BREAST BIOPSY Right 1995   IR CV LINE  INJECTION  04/21/2023   IR IMAGING GUIDED PORT INSERTION  05/04/2023   IR REMOVAL TUN ACCESS W/ PORT W/O FL MOD SED  05/04/2023   MASTECTOMY PARTIAL / LUMPECTOMY W/ AXILLARY LYMPHADENECTOMY Right 1997   PORTACATH PLACEMENT N/A 10/14/2022   Procedure: PORT PLACEMENT;  Surgeon: Almond Lint, MD;  Location: [facility];  Service: General;  Laterality: N/A;   TOTAL MASTECTOMY Right 10/14/2022   Procedure: RIGHT MASTECTOMY;  Surgeon: Almond Lint, MD;  Location: [facility] OR;  Service: General;  Laterality: Right;  PEC BLOCK   TUBAL LIGATION Bilateral 1977    SOCIAL HISTORY: Social History   Socioeconomic History   Marital status: Married    Spouse name: Not on file   Number of children: 1   Years of education: Assoc    Highest education level: Not on file  Occupational History    Comment: retired  Tobacco Use   Smoking status: Former    Current packs/day: 0.00    Average packs/day: 1 pack/day for 20.0 years (20.0 ttl pk-yrs)    Types: Cigarettes    Start date:  46    Quit date: 1991    Years since quitting: 33.8   Smokeless tobacco: Never  Vaping Use   Vaping status: Never Used  Substance and Sexual Activity   Alcohol use: Yes    Alcohol/week: 7.0 standard drinks of alcohol    Types: 7 Glasses of wine per week    Comment: Wine prior to dinner   Drug use: Never   Sexual activity: Not on file  Other Topics Concern   Not on file  Social History Narrative   3 caffeine drinks a day    Social Determinants of Health   Financial Resource Strain: Not on file  Food Insecurity: No Food Insecurity (11/22/2022)   Hunger Vital Sign    Worried About Running Out of Food in the Last Year: Never true    Ran Out of Food in the Last Year: Never true  Transportation Needs: No Transportation Needs (11/22/2022)   PRAPARE - Administrator, Civil Service (Medical): No    Lack of Transportation (Non-Medical): No  Physical Activity: Not on file  Stress: Not on file  Social Connections: Not on file  Intimate Partner Violence: Not At Risk (11/22/2022)   Humiliation, Afraid, Rape, and Kick questionnaire    Fear of Current or Ex-Partner: No    Emotionally Abused: No    Physically Abused: No    Sexually Abused: No    FAMILY HISTORY: Family History  Problem Relation Age of Onset   Osteoporosis Mother    Breast cancer Mother 19   Heart Problems Mother    Other Father        MI   CVA Father    Asthma Father    Diabetes Mellitus II Father    Hypertension Sister    Ovarian cancer Sister 74    Review of Systems  Constitutional:  Negative for appetite change, chills, fatigue, fever and unexpected weight change.  HENT:   Negative for hearing loss, lump/mass and trouble swallowing.   Eyes:  Negative for eye problems and icterus.  Respiratory:  Negative for chest tightness, cough and shortness of breath.   Cardiovascular:  Negative for chest pain, leg swelling and palpitations.  Gastrointestinal:  Negative for abdominal distention, abdominal  pain, constipation, diarrhea, nausea and vomiting.  Endocrine: Negative for hot flashes.  Genitourinary:  Negative for difficulty urinating.   Musculoskeletal:  Negative for arthralgias.  Skin:  Negative for itching and rash.  Neurological:  Negative for dizziness, extremity weakness, headaches and numbness.  Hematological:  Negative for adenopathy. Does not bruise/bleed easily.  Psychiatric/Behavioral:  Negative for depression. The patient is not nervous/anxious.       PHYSICAL EXAMINATION    Vitals:   07/13/23 0923  BP: (!) 102/52  Pulse: 79  Resp: 18  Temp: 97.7 F (36.5 C)  SpO2: 96%    Physical Exam Constitutional:      General: She is not in acute distress.    Appearance: Normal appearance. She is not toxic-appearing.  HENT:     Head: Normocephalic and atraumatic.     Mouth/Throat:     Mouth: Mucous membranes are moist.     Pharynx: Oropharynx is clear. No oropharyngeal exudate or posterior oropharyngeal erythema.  Eyes:     General: No scleral icterus. Cardiovascular:     Rate and Rhythm: Normal rate and regular rhythm.     Pulses: Normal pulses.     Heart sounds: Normal heart sounds.  Pulmonary:     Effort: Pulmonary effort is normal.     Breath sounds: Normal breath sounds.  Abdominal:     General: Abdomen is flat. Bowel sounds are normal. There is no distension.     Palpations: Abdomen is soft.     Tenderness: There is no abdominal tenderness.  Musculoskeletal:        General: No swelling.     Cervical back: Neck supple.  Lymphadenopathy:     Cervical: No cervical adenopathy.  Skin:    General: Skin is warm and dry.     Findings: No rash.  Neurological:     General: No focal deficit present.     Mental Status: She is alert.  Psychiatric:        Mood and Affect: Mood normal.        Behavior: Behavior normal.     LABORATORY DATA:  CBC    Component Value Date/Time   WBC 6.2 07/13/2023 0844   WBC 15.2 (H) 11/26/2022 0655   RBC 3.27 (L)  07/13/2023 0844   HGB 10.4 (L) 07/13/2023 0844   HCT 31.6 (L) 07/13/2023 0844   PLT 237 07/13/2023 0844   MCV 96.6 07/13/2023 0844   MCH 31.8 07/13/2023 0844   MCHC 32.9 07/13/2023 0844   RDW 15.5 07/13/2023 0844   LYMPHSABS 0.7 07/13/2023 0844   MONOABS 0.9 07/13/2023 0844   EOSABS 0.4 07/13/2023 0844   BASOSABS 0.1 07/13/2023 0844    CMP     Component Value Date/Time   NA 140 07/13/2023 0844   K 3.8 07/13/2023 0844   CL 105 07/13/2023 0844   CO2 26 07/13/2023 0844   GLUCOSE 184 (H) 07/13/2023 0844   BUN 20 07/13/2023 0844   CREATININE 0.89 07/13/2023 0844   CALCIUM 9.4 07/13/2023 0844   PROT 7.2 07/13/2023 0844   ALBUMIN 4.2 07/13/2023 0844   AST 13 (L) 07/13/2023 0844   ALT 11 07/13/2023 0844   ALKPHOS 46 07/13/2023 0844   BILITOT 0.4 07/13/2023 0844   GFRNONAA >60 07/13/2023 0844   GFRAA  12/18/2008 0505    >60        The eGFR has been calculated using the MDRD equation. This calculation has not been validated in all clinical situations. eGFR's persistently <60 mL/min signify possible Chronic Kidney Disease.         ASSESSMENT and THERAPY PLAN:   Malignant neoplasm of upper-outer quadrant of right  breast in female, estrogen receptor positive (HCC) 10/14/2022:Right mastectomy: High-grade IDC 3.4 cm with focal sarcomatoid features (metaplastic) 3.4 cm grade 3 with DCIS, lymphovascular invasion present, focal perineural invasion present, margins negative ER 0%, PR 0%, HER2 1+ (repeat prognostic panel was triple negative)   Treatment plan: adjuvant chemotherapy with CMF x 6 cycles started 11/12/2022 (held for wound infection, resumed 03/29/2023) 2. no role of antiestrogen therapy since her repeat prognostic panel was triple negative -------------------------------------------------------------------------------------------------------------------------------------------- Current treatment: Cycle 6 CMF (her second cycle was skipped therefore getting the count of  her chemotherapy cycles off.  Today is indeed cycle 5 if you count them) Chemo toxicities: Hospitalization 11/21/2022-11/26/2022: Cellulitis with neutropenic fever Fatigue: Persistent, managed with energy conservation Mouth sores: Continue with Valtrex, Magic mouthwash, and fluconazole.   Breast Cancer Treatment Patient is due for final treatment. Labs including white blood cells, hemoglobin, platelets, kidney function, and liver function are all within normal limits. - Proceed with final treatment. - Follow up with Dr. Pamelia Hoit in four weeks.  Oral Ulcerations and Thrush Patient reports ongoing symptoms. - Continue Valtrex, refilled today - Remove Morphine Sulfate from medication list per patient's request. -Continue Fluconazole, refilled today  Port Removal Patient has a port that was placed by radiology and can be removed after chemotherapy completion today.  - Order placed for radiology to remove the port at [facility].  Deconditioning Patient reports very slight increase in fatigue and shortness of breath, likely due to decreased physical activity. - Encourage gradual increase in physical activity as tolerated.  Bladder Issues Patient reports frequent urination, needing to find a bathroom every hour and a half. - will f/u with urology once treatment is completed.   Survivorship Plan for survivorship visit in three months. - Schedule survivorship visit. - Patient can choose to ring the bell at today's visit, at the four-week follow-up, or at the survivorship visit (she is leaning towards the survivorship visit)  All questions were answered. The patient knows to call the clinic with any problems, questions or concerns. We can certainly see the patient much sooner if necessary.  Total encounter time:30 minutes*in face-to-face visit time, chart review, lab review, care coordination, order entry, and documentation of the encounter time.    Lillard Anes, NP 07/13/23 9:56  AM Medical Oncology and Hematology [facility] [address] [place], [place] [address] Tel. [phone]    Fax. [phone]  *Total Encounter Time as defined by the Centers for Medicare and Medicaid Services includes, in addition to the face-to-face time of a patient visit (documented in the note above) non-face-to-face time: obtaining and reviewing outside history, ordering and reviewing medications, tests or procedures, care coordination (communications with other health care professionals or caregivers) and documentation in the medical record.

## 2023-07-13 NOTE — Assessment & Plan Note (Signed)
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 no role of antiestrogen therapy since her repeat prognostic panel was triple negative -------------------------------------------------------------------------------------------------------------------------------------------- Current treatment: Cycle 6 CMF (her second cycle was skipped therefore getting the count of her chemotherapy cycles off.  Today is indeed cycle 5 if you count them) Chemo toxicities: Hospitalization 11/21/2022-11/26/2022: Cellulitis with neutropenic fever Fatigue: Persistent, managed with energy conservation Mouth sores: Continue with Valtrex, Magic mouthwash, and fluconazole.   Breast Cancer Treatment Patient is due for final treatment. Labs including white blood cells, hemoglobin, platelets, kidney function, and liver function are all within normal limits. - Proceed with final treatment. - Follow up with Dr. Pamelia Hoit in four weeks.  Oral Ulcerations and Thrush Patient reports ongoing symptoms. - Continue Valtrex, refilled today - Remove Morphine Sulfate from medication list per patient's request. -Continue Fluconazole, refilled today  Port Removal Patient has a port that was placed by radiology and can be removed after chemotherapy completion today.  - Order placed for radiology to remove the port at [facility].  Deconditioning Patient reports very slight increase in fatigue and shortness of breath, likely due to decreased physical activity. - Encourage gradual increase in physical activity as tolerated.  Bladder Issues Patient reports frequent urination, needing to find  a bathroom every hour and a half. - will f/u with urology once treatment is completed.   Survivorship Plan for survivorship visit in three months. - Schedule survivorship visit. - Patient can choose to ring the bell at today's visit, at the four-week follow-up, or at the survivorship visit (she is leaning towards the survivorship visit)

## 2023-07-14 ENCOUNTER — Telehealth: Payer: Self-pay | Admitting: Hematology and Oncology

## 2023-07-14 NOTE — Telephone Encounter (Signed)
Spoke with patient confirming upcoming appointment  

## 2023-07-15 ENCOUNTER — Other Ambulatory Visit: Payer: Self-pay

## 2023-07-20 DIAGNOSIS — I2510 Atherosclerotic heart disease of native coronary artery without angina pectoris: Secondary | ICD-10-CM | POA: Diagnosis not present

## 2023-07-20 DIAGNOSIS — Z794 Long term (current) use of insulin: Secondary | ICD-10-CM | POA: Diagnosis not present

## 2023-07-20 DIAGNOSIS — E1149 Type 2 diabetes mellitus with other diabetic neurological complication: Secondary | ICD-10-CM | POA: Diagnosis not present

## 2023-07-20 DIAGNOSIS — I10 Essential (primary) hypertension: Secondary | ICD-10-CM | POA: Diagnosis not present

## 2023-08-15 DIAGNOSIS — E1149 Type 2 diabetes mellitus with other diabetic neurological complication: Secondary | ICD-10-CM | POA: Diagnosis not present

## 2023-08-15 DIAGNOSIS — J45909 Unspecified asthma, uncomplicated: Secondary | ICD-10-CM | POA: Diagnosis not present

## 2023-08-15 DIAGNOSIS — I10 Essential (primary) hypertension: Secondary | ICD-10-CM | POA: Diagnosis not present

## 2023-08-15 DIAGNOSIS — I2510 Atherosclerotic heart disease of native coronary artery without angina pectoris: Secondary | ICD-10-CM | POA: Diagnosis not present

## 2023-08-15 DIAGNOSIS — E785 Hyperlipidemia, unspecified: Secondary | ICD-10-CM | POA: Diagnosis not present

## 2023-08-15 DIAGNOSIS — F39 Unspecified mood [affective] disorder: Secondary | ICD-10-CM | POA: Diagnosis not present

## 2023-08-15 DIAGNOSIS — K148 Other diseases of tongue: Secondary | ICD-10-CM | POA: Diagnosis not present

## 2023-08-15 DIAGNOSIS — C50411 Malignant neoplasm of upper-outer quadrant of right female breast: Secondary | ICD-10-CM | POA: Diagnosis not present

## 2023-08-15 DIAGNOSIS — H9190 Unspecified hearing loss, unspecified ear: Secondary | ICD-10-CM | POA: Diagnosis not present

## 2023-08-15 DIAGNOSIS — Z23 Encounter for immunization: Secondary | ICD-10-CM | POA: Diagnosis not present

## 2023-08-15 DIAGNOSIS — N3281 Overactive bladder: Secondary | ICD-10-CM | POA: Diagnosis not present

## 2023-08-15 DIAGNOSIS — R413 Other amnesia: Secondary | ICD-10-CM | POA: Diagnosis not present

## 2023-08-19 ENCOUNTER — Inpatient Hospital Stay: Payer: Medicare Other | Attending: Hematology and Oncology | Admitting: Hematology and Oncology

## 2023-08-19 ENCOUNTER — Encounter: Payer: Self-pay | Admitting: *Deleted

## 2023-08-19 VITALS — BP 130/53 | HR 84 | Temp 97.2°F | Resp 17 | Ht 67.0 in | Wt 142.1 lb

## 2023-08-19 DIAGNOSIS — Z79899 Other long term (current) drug therapy: Secondary | ICD-10-CM | POA: Diagnosis not present

## 2023-08-19 DIAGNOSIS — R5383 Other fatigue: Secondary | ICD-10-CM | POA: Diagnosis not present

## 2023-08-19 DIAGNOSIS — Z885 Allergy status to narcotic agent status: Secondary | ICD-10-CM | POA: Diagnosis not present

## 2023-08-19 DIAGNOSIS — K1379 Other lesions of oral mucosa: Secondary | ICD-10-CM | POA: Diagnosis not present

## 2023-08-19 DIAGNOSIS — E119 Type 2 diabetes mellitus without complications: Secondary | ICD-10-CM | POA: Diagnosis not present

## 2023-08-19 DIAGNOSIS — Z881 Allergy status to other antibiotic agents status: Secondary | ICD-10-CM | POA: Insufficient documentation

## 2023-08-19 DIAGNOSIS — Z170 Estrogen receptor positive status [ER+]: Secondary | ICD-10-CM | POA: Insufficient documentation

## 2023-08-19 DIAGNOSIS — Z9221 Personal history of antineoplastic chemotherapy: Secondary | ICD-10-CM | POA: Diagnosis not present

## 2023-08-19 DIAGNOSIS — C50411 Malignant neoplasm of upper-outer quadrant of right female breast: Secondary | ICD-10-CM | POA: Diagnosis not present

## 2023-08-19 DIAGNOSIS — Z5111 Encounter for antineoplastic chemotherapy: Secondary | ICD-10-CM | POA: Insufficient documentation

## 2023-08-19 DIAGNOSIS — Z9011 Acquired absence of right breast and nipple: Secondary | ICD-10-CM | POA: Diagnosis not present

## 2023-08-19 DIAGNOSIS — B370 Candidal stomatitis: Secondary | ICD-10-CM | POA: Diagnosis not present

## 2023-08-19 NOTE — Progress Notes (Signed)
Patient Care Team: Chilton Greathouse, MD as PCP - General (Internal Medicine) Chilton Greathouse, MD as Consulting Physician (Internal Medicine) Serena Croissant, MD as Consulting Physician (Hematology and Oncology) Pershing Proud, RN as Oncology Nurse Navigator Donnelly Angelica, RN as Oncology Nurse Navigator  DIAGNOSIS:  Encounter Diagnosis  Name Primary?   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) 3.4 cm grade 3 with DCIS, lymphovascular invasion present, focal perineural invasion present, margins negative ER 50%, PR 0%, HER2 1+   10/14/2022 Cancer Staging   Staging form: Breast, AJCC 8th Edition - Pathologic stage from 10/14/2022: Stage IIA (pT2, pN0, cM0, G3, ER+, PR-, HER2-) - Signed by Loa Socks, NP on 05/10/2023 Histologic grading system: 3 grade system   11/12/2022 -  Chemotherapy   Patient is on Treatment Plan : BREAST Adjuvant CMF IV q21d       CHIEF COMPLIANT: Follow-up after chemotherapy has been completed  HISTORY OF PRESENT ILLNESS:   History of Present Illness   The patient, with a history of cancer, presents for follow-up after completing chemotherapy in November. She reports a generally slow recovery, with a recent setback due to an episode of diarrhea following a dinner party. She denies any similar symptoms in her partner, suggesting the cause may have been the food.  The patient also reports a persistent sore spot in her mouth, which she believes is  unrelated to the thrush she was previously treated for. She has a history of three oral surgeries and suspects she may need to consult her oral surgeon about this issue.  The patient also mentions a delay in having a port removed due to personal circumstances, specifically the imminent passing of her sister. She expresses some concern about what might be next in her health journey, given her age and recent cancer treatment.         ALLERGIES:  is allergic to azithromycin and demerol [meperidine].  MEDICATIONS:  Current Outpatient Medications  Medication Sig Dispense Refill   aspirin 325 MG tablet Take 325 mg by mouth daily.     BD AUTOSHIELD DUO 30G X 5 MM MISC      BD PEN NEEDLE NANO U/F 32G X 4 MM MISC SMARTSIG:2 Pen Needle SUB-Q Daily     Bempedoic Acid-Ezetimibe (NEXLIZET) 180-10 MG TABS Take 1 tablet by mouth daily. (Patient taking differently: Take 1 tablet by mouth every other day.) 90 tablet 3   citalopram (CELEXA) 40 MG tablet Take 40 mg by mouth daily.     Continuous Blood Gluc Sensor (FREESTYLE LIBRE 14 DAY SENSOR) MISC Apply topically every 14 (fourteen) days.     Cranberry Soft 500 MG CHEW Chew 500 mg by mouth daily.     ibuprofen (ADVIL) 200 MG tablet Take 200 mg by mouth every 6 (six) hours as needed for moderate pain.     insulin lispro (HUMALOG) 100 UNIT/ML injection Inject 0.3 mLs (30 Units total) into the skin 3 (three) times daily before meals. 10 mL 11   lidocaine (XYLOCAINE) 2 %  solution Use as directed 15 mLs in the mouth or throat every 6 (six) hours as needed for mouth pain.     metoprolol tartrate (LOPRESSOR) 25 MG tablet TAKE 1/2 TABLET TWICE DAILY 180 tablet 3   Multiple Vitamins-Minerals (PRESERVISION AREDS) CAPS Take 1 capsule by mouth in the morning and at bedtime.     nitroGLYCERIN (NITROSTAT) 0.4 MG SL tablet Place 0.4 mg under the tongue every 5 (five) minutes as needed for chest pain.     ramipril (ALTACE) 2.5 MG capsule Take 2.5 mg by mouth daily.      Sodium Chloride-Sodium Bicarb (SODIUM BICARBONATE/SODIUM CHLORIDE) SOLN Swish and spit as needed for mouth pain 250 mL 1   TRESIBA FLEXTOUCH 200 UNIT/ML FlexTouch Pen Inject 32 Units into the skin daily after lunch. We've reduced your insulin dose, please follow up with your PCP outpatient and adjust this further as needed. (Patient taking differently: Inject 64 Units into the skin daily after lunch. We've reduced your insulin dose, please follow up with your PCP outpatient and adjust this further as needed.)     vitamin B-12 (CYANOCOBALAMIN) 1000 MCG tablet Take 1,000 mcg by mouth daily.     No current facility-administered medications for this visit.    PHYSICAL EXAMINATION: ECOG PERFORMANCE STATUS: 1 - Symptomatic but completely ambulatory  Vitals:   08/19/23 1125  BP: (!) 130/53  Pulse: 84  Resp: 17  Temp: (!) 97.2 F (36.2 C)  SpO2: 99%   Filed Weights   08/19/23 1125  Weight: 142 lb 1.6 oz (64.5 kg)      LABORATORY DATA:  I have reviewed the data as listed    Latest Ref Rng & Units 07/13/2023    8:44 AM 06/21/2023   11:19 AM 05/31/2023   10:05 AM  CMP  Glucose 70 - 99 mg/dL 161  096  045   BUN 8 - 23 mg/dL 20  22  21    Creatinine 0.44 - 1.00 mg/dL 4.09  8.11  9.14   Sodium 135 - 145 mmol/L 140  137  138   Potassium 3.5 - 5.1 mmol/L 3.8  4.4  4.1   Chloride 98 - 111 mmol/L 105  103  103   CO2 22 - 32 mmol/L 26  26  27    Calcium 8.9 - 10.3 mg/dL 9.4  9.6  9.4   Total Protein 6.5 - 8.1 g/dL 7.2  7.4  7.4   Total Bilirubin <1.2 mg/dL 0.4  0.3  0.4   Alkaline Phos 38 - 126 U/L 46  49  46   AST 15 - 41 U/L 13  15  14    ALT 0 - 44 U/L 11  12  13      Lab Results  Component Value Date   WBC 6.2 07/13/2023   HGB 10.4 (L) 07/13/2023   HCT 31.6 (L) 07/13/2023   MCV 96.6 07/13/2023   PLT 237 07/13/2023   NEUTROABS 3.9 07/13/2023    ASSESSMENT & PLAN:  Malignant neoplasm of upper-outer quadrant of right breast in female, estrogen receptor positive  (HCC) 10/14/2022:Right mastectomy: High-grade 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 no role of antiestrogen therapy since her repeat prognostic panel was triple negative ----------------------------------------------------------------------------------------------------------------------  Ongoing chemotherapy related side effects: Fatigue Mouth sores Deconditioning Bladder issues  Return to clinic for survivorship care plan visit ------------------------------------- Assessment and Plan    Cancer Post-Chemotherapy Completed chemotherapy in November 2024. Recent episode of diarrhea after a dinner party, but no ongoing symptoms. -Plan to initiate Guardant blood test every six months for surveillance of cancer recurrence. -Continue current medications as listed. -Follow-up in six months with Lillia Abed.  Diabetes Managed with Evaristo Bury FlexTouch. -Continue current management.  Oral Health History of oral surgeries and recent oral discomfort. -Recommend follow-up with oral surgeon for evaluation.  General Health Maintenance -Continue monitoring overall health status. -Plan for patient to ring the bell to signify end of chemotherapy treatment.          No orders of the defined types were placed in this encounter.  The patient has a good understanding of the overall plan. she agrees with it. she will call with any problems that may develop before the next visit here. Total time spent: 30 mins including face to face time and time spent for planning, charting and co-ordination of care   Tamsen Meek, MD 08/19/23

## 2023-08-19 NOTE — Progress Notes (Signed)
Per MD request RN successfully faxed Guardant Reveal request.

## 2023-08-19 NOTE — Assessment & Plan Note (Signed)
10/14/2022:Right mastectomy: High-grade 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 no role of antiestrogen therapy since her repeat prognostic panel was triple negative ---------------------------------------------------------------------------------------------------------------------- Ongoing chemotherapy related side effects: Fatigue Mouth sores Deconditioning Bladder issues  Return to clinic for survivorship care plan visit

## 2023-08-26 ENCOUNTER — Ambulatory Visit (HOSPITAL_COMMUNITY)
Admission: RE | Admit: 2023-08-26 | Discharge: 2023-08-26 | Disposition: A | Discharge status: Home / Self Care | Payer: Medicare Other | Source: Ambulatory Visit | Attending: Hematology and Oncology | Admitting: Hematology and Oncology

## 2023-08-26 DIAGNOSIS — Z170 Estrogen receptor positive status [ER+]: Secondary | ICD-10-CM | POA: Insufficient documentation

## 2023-08-26 DIAGNOSIS — Z452 Encounter for adjustment and management of vascular access device: Secondary | ICD-10-CM | POA: Insufficient documentation

## 2023-08-26 DIAGNOSIS — C50411 Malignant neoplasm of upper-outer quadrant of right female breast: Secondary | ICD-10-CM | POA: Diagnosis not present

## 2023-08-26 HISTORY — PX: IR REMOVAL TUN ACCESS W/ PORT W/O FL MOD SED: IMG2290

## 2023-08-26 MED ORDER — LIDOCAINE-EPINEPHRINE 1 %-1:100000 IJ SOLN
20.0000 mL | Freq: Once | INTRAMUSCULAR | Status: AC
  Administered 2023-08-26: 10 mL via INTRADERMAL

## 2023-08-26 MED ORDER — LIDOCAINE-EPINEPHRINE 1 %-1:100000 IJ SOLN
INTRAMUSCULAR | Status: AC
Start: 2023-08-26 — End: ?
  Filled 2023-08-26: qty 1

## 2023-08-26 NOTE — Procedures (Signed)
Pre Procedural Dx: Poor venous access Post Procedural Dx: Same  Successful removal of anterior chest wall port-a-cath.  EBL: Trace No immediate post procedural complications.   Jay Yareth Kearse, MD Pager #: [phone]  

## 2023-09-09 DIAGNOSIS — R350 Frequency of micturition: Secondary | ICD-10-CM | POA: Diagnosis not present

## 2023-09-09 DIAGNOSIS — N3020 Other chronic cystitis without hematuria: Secondary | ICD-10-CM | POA: Diagnosis not present

## 2023-09-09 DIAGNOSIS — N3941 Urge incontinence: Secondary | ICD-10-CM | POA: Diagnosis not present

## 2023-09-16 ENCOUNTER — Encounter: Payer: Self-pay | Admitting: *Deleted

## 2023-09-16 NOTE — Progress Notes (Signed)
 Received call from [employer]ant Reveal team stating pt wishes to not proceed with testing at this time due to a Medicare ABN form that needed to be signed.  Testing canceled.

## 2023-09-21 DIAGNOSIS — H52223 Regular astigmatism, bilateral: Secondary | ICD-10-CM | POA: Diagnosis not present

## 2023-09-21 DIAGNOSIS — E119 Type 2 diabetes mellitus without complications: Secondary | ICD-10-CM | POA: Diagnosis not present

## 2023-09-21 DIAGNOSIS — H524 Presbyopia: Secondary | ICD-10-CM | POA: Diagnosis not present

## 2023-09-21 DIAGNOSIS — H26491 Other secondary cataract, right eye: Secondary | ICD-10-CM | POA: Diagnosis not present

## 2023-09-21 DIAGNOSIS — H353132 Nonexudative age-related macular degeneration, bilateral, intermediate dry stage: Secondary | ICD-10-CM | POA: Diagnosis not present

## 2023-09-21 DIAGNOSIS — H43823 Vitreomacular adhesion, bilateral: Secondary | ICD-10-CM | POA: Diagnosis not present

## 2023-09-27 DIAGNOSIS — H35033 Hypertensive retinopathy, bilateral: Secondary | ICD-10-CM | POA: Diagnosis not present

## 2023-09-27 DIAGNOSIS — H43823 Vitreomacular adhesion, bilateral: Secondary | ICD-10-CM | POA: Diagnosis not present

## 2023-09-27 DIAGNOSIS — H353132 Nonexudative age-related macular degeneration, bilateral, intermediate dry stage: Secondary | ICD-10-CM | POA: Diagnosis not present

## 2023-10-07 ENCOUNTER — Other Ambulatory Visit: Payer: Self-pay | Admitting: Urology

## 2023-10-11 NOTE — Patient Instructions (Signed)
SURGICAL WAITING ROOM VISITATION  Patients having surgery or a procedure may have no more than 2 support people in the waiting area - these visitors may rotate.    Children under the age of 43 must have an adult with them who is not the patient.  Due to an increase in RSV and influenza rates and associated hospitalizations, children ages 4 and under may not visit patients in [facility] hospitals.  Visitors with respiratory illnesses are discouraged from visiting and should remain at home.  If the patient needs to stay at the hospital during part of their recovery, the visitor guidelines for inpatient rooms apply. Pre-op nurse will coordinate an appropriate time for 1 support person to accompany patient in pre-op.  This support person may not rotate.    Please refer to the [facility] website for the visitor guidelines for Inpatients (after your surgery is over and you are in a regular room).       Your procedure is scheduled on: 10/20/23   Report to [facility] Main Entrance    Report to admitting at  9:30 AM   Call this number if you have problems the morning of surgery [phone]   Do not eat food or drink liquids :After Midnight.      Oral Hygiene is also important to reduce your risk of infection.                                    Remember - BRUSH YOUR TEETH THE MORNING OF SURGERY WITH YOUR REGULAR TOOTHPASTE  DENTURES WILL BE REMOVED PRIOR TO SURGERY PLEASE DO NOT APPLY "Poly grip" OR ADHESIVES!!!   Stop all vitamins and herbal supplements 7 days before surgery.   Take these medicines the morning of surgery with A SIP OF WATER: celexa, metoprolol, bempedoic acid ezetimibe  DO NOT TAKE ANY ORAL DIABETIC MEDICATIONS DAY OF YOUR SURGERY  Bring CPAP mask and tubing day of surgery.                              You may not have any metal on your body including hair pins, jewelry, and body piercing             Do not wear make-up, lotions, powders,  perfumes/cologne, or deodorant  Do not wear nail polish including gel and S&S, artificial/acrylic nails, or any other type of covering on natural nails including finger and toenails. If you have artificial nails, gel coating, etc. that needs to be removed by a nail salon please have this removed prior to surgery or surgery may need to be canceled/ delayed if the surgeon/ anesthesia feels like they are unable to be safely monitored.   Do not shave  48 hours prior to surgery.    Do not bring valuables to the hospital. [place] IS NOT             RESPONSIBLE   FOR VALUABLES.   Contacts, glasses, dentures or bridgework may not be worn into surgery.  DO NOT BRING YOUR HOME MEDICATIONS TO THE HOSPITAL. PHARMACY WILL DISPENSE MEDICATIONS LISTED ON YOUR MEDICATION LIST TO YOU DURING YOUR ADMISSION IN THE HOSPITAL!    Patients discharged on the day of surgery will not be allowed to drive home.  Someone NEEDS to stay with you for the first 24 hours after anesthesia.  Special Instructions: Bring a copy of your healthcare power of attorney and living will documents the day of surgery if you haven't scanned them before.              Please read over the following fact sheets you were given: IF YOU HAVE QUESTIONS ABOUT YOUR PRE-OP INSTRUCTIONS PLEASE CALL [phone] [facility]   If you received a COVID test during your pre-op visit  it is requested that you wear a mask when out in public, stay away from anyone that may not be feeling well and notify your surgeon if you develop symptoms. If you test positive for Covid or have been in contact with anyone that has tested positive in the last 10 days please notify you surgeon.     [facility] - Preparing for Surgery  Before surgery, you can play an important role.  Because skin is not sterile, your skin needs to be as free of germs as possible.  You can reduce the number of germs on your skin by washing with CHG (chlorahexidine gluconate) soap before  surgery.  CHG is an antiseptic cleaner which kills germs and bonds with the skin to continue killing germs even after washing. Please DO NOT use if you have an allergy to CHG or antibacterial soaps.  If your skin becomes reddened/irritated stop using the CHG and inform your nurse when you arrive at Short Stay. Do not shave (including legs and underarms) for at least 48 hours prior to the first CHG shower.  You may shave your face/neck.  Please follow these instructions carefully:  1.  Shower with CHG Soap the night before surgery and the  morning of surgery.  2.  If you choose to wash your hair, wash your hair first as usual with your normal  shampoo.  3.  After you shampoo, rinse your hair and body thoroughly to remove the shampoo.                             4.  Use CHG as you would any other liquid soap.  You can apply chg directly to the skin and wash.  Gently with a scrungie or clean washcloth.  5.  Apply the CHG Soap to your body ONLY FROM THE NECK DOWN.   Do   not use on face/ open                           Wound or open sores. Avoid contact with eyes, ears mouth and   genitals (private parts).                       Wash face,  Genitals (private parts) with your normal soap.             6.  Wash thoroughly, paying special attention to the area where your    surgery  will be performed.  7.  Thoroughly rinse your body with warm water from the neck down.  8.  DO NOT shower/wash with your normal soap after using and rinsing off the CHG Soap.                9.  Pat yourself dry with a clean towel.            10.  Wear clean pajamas.            11.  Place  clean sheets on your bed the night of your first shower and do not  sleep with pets. Day of Surgery : Do not apply any lotions/deodorants the morning of surgery.  Please wear clean clothes to the hospital/surgery center.  FAILURE TO FOLLOW THESE INSTRUCTIONS MAY RESULT IN THE CANCELLATION OF YOUR SURGERY  PATIENT  SIGNATURE_________________________________  NURSE SIGNATURE__________________________________  ________________________________________________________________________How to Manage Your Diabetes Before and After Surgery  Why is it important to control my blood sugar before and after surgery? Improving blood sugar levels before and after surgery helps healing and can limit problems. A way of improving blood sugar control is eating a healthy diet by:  Eating less sugar and carbohydrates  Increasing activity/exercise  Talking with your doctor about reaching your blood sugar goals High blood sugars (greater than 180 mg/dL) can raise your risk of infections and slow your recovery, so you will need to focus on controlling your diabetes during the weeks before surgery. Make sure that the doctor who takes care of your diabetes knows about your planned surgery including the date and location.  How do I manage my blood sugar before surgery? Check your blood sugar at least 4 times a day, starting 2 days before surgery, to make sure that the level is not too high or low. Check your blood sugar the morning of your surgery when you wake up and every 2 hours until you get to the Short Stay unit. If your blood sugar is less than 70 mg/dL, you will need to treat for low blood sugar: Do not take insulin. Treat a low blood sugar (less than 70 mg/dL) with  cup of clear juice (cranberry or apple), 4 glucose tablets, OR glucose gel. Recheck blood sugar in 15 minutes after treatment (to make sure it is greater than 70 mg/dL). If your blood sugar is not greater than 70 mg/dL on recheck, call [phone] for further instructions. Report your blood sugar to the short stay nurse when you get to Short Stay.  If you are admitted to the hospital after surgery: Your blood sugar will be checked by the staff and you will probably be given insulin after surgery (instead of oral diabetes medicines) to make sure you have  good blood sugar levels. The goal for blood sugar control after surgery is 80-180 mg/dL.   WHAT DO I DO ABOUT MY DIABETES MEDICATION?  Do not take oral diabetes medicines (pills) the morning of surgery.  THE NIGHT BEFORE SURGERY,Do not take evening dose of Humalog sliding scale           May take Tresiba insulin as usual at lunch time  THE MORNING OF SURGERY : if blood sugar greater than 220 take only half of usual sliding scale Humalog.  DO NOT TAKE THE FOLLOWING 7 DAYS PRIOR TO SURGERY: Ozempic, Wegovy, Rybelsus (Semaglutide), Byetta (exenatide), Bydureon (exenatide ER), Victoza, Saxenda (liraglutide), or Trulicity (dulaglutide) Mounjaro (Tirzepatide) Adlyxin (Lixisenatide), Polyethylene Glycol Loxenatide.  Patient Signature:  Date:   Nurse Signature:  Date:   Reviewed and Endorsed by [facility] Patient Education Committee, August 2015

## 2023-10-11 NOTE — Progress Notes (Addendum)
 COVID Vaccine received:  []  No [x]  Yes Date of any COVID positive Test in last 90 days: no PCP - Searcy Overcast MD Cardiologist - Dr. Ladona   Chest x-ray -  EKG -  10/13/23 Epic Stress Test - 09/21/22 Epic ECHO -  Cardiac Cath -   Bowel Prep - [x]  No  []   Yes ______  Pacemaker / ICD device [x]  No []  Yes   Spinal Cord Stimulator:[x]  No []  Yes       History of Sleep Apnea? []  No [x]  Yes   CPAP used?- [x]  No []  Yes    Does the patient monitor blood sugar?          []  No  Yes  [x]    Patient has: []  NO Hx DM   []  Pre-DM                 []  DM1  [x]   DM2 Does patient have a [employer] or Dexacom? []  No [x]  Yes   Fasting Blood Sugar Ranges- 130s-180's Checks Blood Sugar __3___ times a day  GLP1 agonist / usual dose - no GLP1 instructions:  SGLT-2 inhibitors / usual dose - no SGLT-2 instructions:   Blood Thinner / Instructions:no Aspirin  Instructions:ASA 325 mg daily Will stop 2 days before surgery per MD office.  Comments:   Activity level: Patient is able  to climb a flight of stairs without difficulty; [x]  No CP  [x]  No SOB,    Patient can  perform ADLs without assistance.   Anesthesia review: OSA,MI w/ stents, HTN, DM2  Patient denies shortness of breath, fever, cough and chest pain at PAT appointment.  Patient verbalized understanding and agreement to the Pre-Surgical Instructions that were given to them at this PAT appointment. Patient was also educated of the need to review these PAT instructions again prior to his/her surgery.I reviewed the appropriate phone numbers to call if they have any and questions or concerns.

## 2023-10-13 ENCOUNTER — Encounter (HOSPITAL_COMMUNITY): Payer: Self-pay

## 2023-10-13 ENCOUNTER — Other Ambulatory Visit: Payer: Self-pay

## 2023-10-13 ENCOUNTER — Encounter (HOSPITAL_COMMUNITY)
Admission: RE | Admit: 2023-10-13 | Discharge: 2023-10-13 | Disposition: A | Discharge status: Home / Self Care | Payer: Medicare Other | Source: Ambulatory Visit | Attending: Urology | Admitting: Urology

## 2023-10-13 VITALS — BP 144/67 | HR 81 | Temp 98.2°F | Resp 16 | Ht 67.0 in | Wt 143.0 lb

## 2023-10-13 DIAGNOSIS — N3281 Overactive bladder: Secondary | ICD-10-CM | POA: Insufficient documentation

## 2023-10-13 DIAGNOSIS — E1149 Type 2 diabetes mellitus with other diabetic neurological complication: Secondary | ICD-10-CM | POA: Insufficient documentation

## 2023-10-13 DIAGNOSIS — I10 Essential (primary) hypertension: Secondary | ICD-10-CM | POA: Diagnosis not present

## 2023-10-13 DIAGNOSIS — I2510 Atherosclerotic heart disease of native coronary artery without angina pectoris: Secondary | ICD-10-CM | POA: Insufficient documentation

## 2023-10-13 DIAGNOSIS — Z87891 Personal history of nicotine dependence: Secondary | ICD-10-CM | POA: Insufficient documentation

## 2023-10-13 DIAGNOSIS — E785 Hyperlipidemia, unspecified: Secondary | ICD-10-CM | POA: Insufficient documentation

## 2023-10-13 DIAGNOSIS — Z794 Long term (current) use of insulin: Secondary | ICD-10-CM | POA: Diagnosis not present

## 2023-10-13 DIAGNOSIS — Z01818 Encounter for other preprocedural examination: Secondary | ICD-10-CM | POA: Insufficient documentation

## 2023-10-13 DIAGNOSIS — I252 Old myocardial infarction: Secondary | ICD-10-CM | POA: Diagnosis not present

## 2023-10-13 DIAGNOSIS — F32A Depression, unspecified: Secondary | ICD-10-CM | POA: Diagnosis not present

## 2023-10-13 DIAGNOSIS — Z9861 Coronary angioplasty status: Secondary | ICD-10-CM | POA: Diagnosis not present

## 2023-10-13 HISTORY — DX: Dyspnea, unspecified: R06.00

## 2023-10-13 LAB — GLUCOSE, CAPILLARY: Glucose-Capillary: 172 mg/dL — ABNORMAL HIGH (ref 70–99)

## 2023-10-13 LAB — CBC
HCT: 37.4 % (ref 36.0–46.0)
Hemoglobin: 12 g/dL (ref 12.0–15.0)
MCH: 31.7 pg (ref 26.0–34.0)
MCHC: 32.1 g/dL (ref 30.0–36.0)
MCV: 98.7 fL (ref 80.0–100.0)
Platelets: 240 10*3/uL (ref 150–400)
RBC: 3.79 MIL/uL — ABNORMAL LOW (ref 3.87–5.11)
RDW: 13.6 % (ref 11.5–15.5)
WBC: 4.9 10*3/uL (ref 4.0–10.5)
nRBC: 0 % (ref 0.0–0.2)

## 2023-10-13 LAB — BASIC METABOLIC PANEL
Anion gap: 10 (ref 5–15)
BUN: 23 mg/dL (ref 8–23)
CO2: 25 mmol/L (ref 22–32)
Calcium: 9.2 mg/dL (ref 8.9–10.3)
Chloride: 101 mmol/L (ref 98–111)
Creatinine, Ser: 0.9 mg/dL (ref 0.44–1.00)
GFR, Estimated: >60 mL/min (ref >60)
Glucose, Bld: 181 mg/dL — ABNORMAL HIGH (ref 70–99)
Potassium: 4.1 mmol/L (ref 3.5–5.1)
Sodium: 136 mmol/L (ref 135–145)

## 2023-10-13 LAB — HEMOGLOBIN A1C
Hgb A1c MFr Bld: 7 % — ABNORMAL HIGH (ref 4.8–5.6)
Mean Plasma Glucose: 154.2 mg/dL

## 2023-10-14 ENCOUNTER — Encounter (HOSPITAL_COMMUNITY): Payer: Self-pay

## 2023-10-14 NOTE — Progress Notes (Signed)
 Case: [id number] Date/Time: 10/20/23 1130   Procedures:      CYSTOSCOPY     CYSTOSCOPY WITH 100 UNITS OF BOTOX  INJECTION - 30 MINUTES NEEDED FOR CASE  100 UNITS OF BOTOX    Anesthesia type: Monitor Anesthesia Care   Pre-op diagnosis: OVERACTIVE BLADDER   Location: [facility] / [facility]   Surgeons: Elisabeth Valli BIRCH, MD       DISCUSSION: Karina Foster is an 83 yo female who presents to PAT prior to procedure above. PMH of former smoking, hx of NSTEMI and CAD s/p PCI (in 2010), HTN, OSA (untreated), GERD, IDDM (A1c 7.0 on 2/6), hx of breast cancer s/p right partial mastectomy, chemo/XRT (in 1997) with recurrence and now s/p total mastectomy and chemo (10/2022), depression.  Follows with cardiology for history of HTN, HLD, CAD s/p DES x 2 to LAD in 2010.  Last seen by Dr. Ganji 12/23/2022. Per Dr. Ladona: Presently doing well without recurrence of angina pectoris and low risk nuclear stress test with normal LVEF by nuclear stress test.  Continue present medications.  Patient denies CP/SOB at PAT visit and has good functional status. Anticipate she can proceed.  Patient follows with Oncology for breast cancer. She has a port and completed chemo in November 2024. Last seen in clinic by Dr. Gudena on 08/19/23. She is undergoing cancer surveillance.  VS: BP (!) 144/67   Pulse 81   Temp 36.8 C (Oral)   Resp 16   Ht 5' 7 (1.702 m)   Wt 64.9 kg   SpO2 100%   BMI 22.40 kg/m   PROVIDERS: Janey Santos, MD Cardiology: Gordy Ladona, MD Oncology: Mackey Chad, MD  LABS: Labs reviewed: Acceptable for surgery. (all labs ordered are listed, but only abnormal results are displayed)  Labs Reviewed  HEMOGLOBIN A1C - Abnormal; Notable for the following components:      Result Value   Hgb A1c MFr Bld 7.0 (*)    All other components within normal limits  BASIC METABOLIC PANEL - Abnormal; Notable for the following components:   Glucose, Bld 181 (*)    All other components within normal  limits  CBC - Abnormal; Notable for the following components:   RBC 3.79 (*)    All other components within normal limits  GLUCOSE, CAPILLARY - Abnormal; Notable for the following components:   Glucose-Capillary 172 (*)    All other components within normal limits     IMAGES:   EKG 10/13/23:  Normal sinus rhythm, rate 76 Minimal voltage criteria for LVH, may be normal variant ( R in aVL )  CV:  Exercise Myoview  stress test 09/21/2021: Exercise nuclear stress test was performed using Bruce protocol.   1 Day Rest and Stress images. Exercise time 3 minutes 33 seconds on Bruce protocol, achieved 5.15 METS, 88 % of APMHR. Hypertensive response to exercise: No (rest 150/72, peak 180/80) Stress ECG negative for ischemia. Small size, mild intensity, reversible perfusion defect involving the apical lateral segment, cannot entirely rule out mild ischemia in either the distal RCA/LCx distribution.  Remainder of the myocardial segments illustrate homogenous tracer uptake without evidence of prior infarct. Calculated LVEF 67%, wall thickness and wall motion preserved. Prior Lexiscan  dated 05/23/2017 reported normal perfusion with calculated LVEF 47%. Low risk study.   Echocardiogram 06/16/2017: Left ventricle cavity is normal in size. Mild concentric hypertrophy of the left ventricle. Mild decrease in global wall motion. Visual EF is 50-55%. Indeterminate diastolic function. Moderate (Grade II) aortic regurgitation. Mild aortic valve  leaflet calcification. Mild interval increase in aortic regurgitation compared to echocardiogram in 2015.  Mild (Grade I) mitral regurgitation. Mildly restricted mitral valve leaflets. Mild tricuspid regurgitation. No evidence of pulmonary hypertension.   Past Medical History:  Diagnosis Date   Allergic rhinitis, seasonal    Arthritis    hands   Asthma    Years ago due to a cat. 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    GERD (gastroesophageal reflux disease)    History of benign neoplasm of tongue    per pt has had several bx's of lesions   History of cancer chemotherapy 1997   right breast cancer   History of external beam radiation therapy 1997   right breast cancer   History of iron deficiency anemia    History of non-ST elevation myocardial infarction (NSTEMI) 12/14/2008   History of right breast cancer 1997   s/p  right partial masectomy node dissection's,  completed chemo and radiation same year ,   no recurrence since   Hyperlipidemia    Mood disorder (HCC)    Myocardial infarction (HCC) 2010   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   OAB (overactive bladder)    OSA (obstructive sleep apnea) 2016   sleep study in epic 10-04-2014, AHI 22.7/ hr;  (06-17-2022  pt stated has not used dental appliance in few years, does not fit )   Peripheral neuropathy    feet   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/2008   x2  to proximal and mid LAD   Urinary incontinence, mixed    urologist--- dr pace   Wears glasses    Wears hearing aid in both ears     Past Surgical History:  Procedure Laterality Date   BOTOX  INJECTION N/A 06/22/2022   Procedure: ABORTED BOTOX  INJECTION 75 UNITS;  Surgeon: Elisabeth Valli BIRCH, MD;  Location: [facility];  Service: Urology;  Laterality: N/A;   BOTOX  INJECTION N/A 07/20/2022   Procedure: BOTOX  INJECTION;  Surgeon: Elisabeth Valli BIRCH,  MD;  Location: [facility];  Service: Urology;  Laterality: N/A;   BREAST BIOPSY Right 08/27/2022   US  RT BREAST BX W LOC DEV 1ST LESION IMG BX SPEC US  GUIDE 08/27/2022 [facility] MAMMOGRAPHY   CATARACT EXTRACTION W/ INTRAOCULAR LENS IMPLANT Bilateral 2014   CORONARY ANGIOPLASTY WITH STENT PLACEMENT  12/16/2008   @[facility]   by dr berry;   PCI and stenting to proximal and mid LAD (DES) w/ residual D1 80%  nonobstructive disease involving RCA/ CFx,  ef 50%   CYSTOSCOPY N/A 06/22/2022   Procedure: CYSTOSCOPY, BLADDER BIOPSY, AND FULGERATION;  Surgeon: Elisabeth Valli BIRCH, MD;  Location: [facility];  Service: Urology;  Laterality: N/A;  30 MINS   CYSTOSCOPY N/A 07/20/2022   Procedure: PHYLLIS TRENT SNELL;  Surgeon: Elisabeth Valli BIRCH, MD;  Location: [facility];  Service: Urology;  Laterality: N/A;  30 MINS   EXCISION OF BREAST BIOPSY Right 1995   IR CV LINE INJECTION  04/21/2023   IR IMAGING GUIDED PORT INSERTION  05/04/2023   IR REMOVAL TUN ACCESS W/ PORT W/O FL MOD SED  05/04/2023   IR REMOVAL TUN ACCESS W/ PORT W/O FL MOD SED  08/26/2023   MASTECTOMY PARTIAL / LUMPECTOMY W/ AXILLARY LYMPHADENECTOMY Right 1997   PORTACATH PLACEMENT N/A 10/14/2022   Procedure: PORT PLACEMENT;  Surgeon: Aron Shoulders, MD;  Location: [facility];  Service: General;  Laterality: N/A;   TOTAL MASTECTOMY Right 10/14/2022   Procedure: RIGHT MASTECTOMY;  Surgeon: Aron Shoulders, MD;  Location: [facility];  Service: General;  Laterality: Right;  PEC BLOCK   TUBAL LIGATION Bilateral 1977    MEDICATIONS:  aspirin  325 MG tablet   BD AUTOSHIELD DUO 30G X 5 MM MISC   BD PEN NEEDLE NANO U/F 32G X 4 MM MISC   Bempedoic Acid -Ezetimibe  (NEXLIZET ) 180-10 MG TABS   citalopram  (CELEXA ) 40 MG tablet   Continuous Blood Gluc Sensor (FREESTYLE LIBRE 14 DAY SENSOR) MISC   Cranberry Soft 500 MG CHEW   ibuprofen  (ADVIL ) 200 MG tablet   insulin  lispro (HUMALOG ) 100 UNIT/ML injection   lidocaine   (XYLOCAINE ) 2 % solution   metoprolol  tartrate (LOPRESSOR ) 25 MG tablet   Multiple Vitamins-Minerals (PRESERVISION AREDS) CAPS   nitroGLYCERIN  (NITROSTAT ) 0.4 MG SL tablet   ramipril  (ALTACE ) 2.5 MG capsule   Sodium Chloride -Sodium Bicarb (SODIUM BICARBONATE /SODIUM CHLORIDE ) SOLN   TRESIBA  FLEXTOUCH 200 UNIT/ML FlexTouch Pen   vitamin B-12 (CYANOCOBALAMIN) 1000 MCG tablet   No current facility-administered medications for this encounter.   Burnard CHRISTELLA Odis DEVONNA [facility] Short Stay/Anesthesiology [facility] Phone [phone] 10/14/2023 10:26 AM

## 2023-10-14 NOTE — Anesthesia Preprocedure Evaluation (Addendum)
Anesthesia Evaluation  Patient identified by MRN, date of birth, ID band Patient awake    Reviewed: Allergy & Precautions, H&P , NPO status , Patient's Chart, lab work & pertinent test results  Airway Mallampati: II   Neck ROM: full    Dental   Pulmonary shortness of breath, sleep apnea , former smoker   breath sounds clear to auscultation       Cardiovascular hypertension, + CAD, + Past MI and + Cardiac Stents   Rhythm:regular Rate:Normal     Neuro/Psych  PSYCHIATRIC DISORDERS  Depression       GI/Hepatic ,GERD  ,,  Endo/Other  diabetes, Type 2    Renal/GU      Musculoskeletal  (+) Arthritis ,    Abdominal   Peds  Hematology   Anesthesia Other Findings   Reproductive/Obstetrics                             Anesthesia Physical Anesthesia Plan  ASA: 3  Anesthesia Plan: MAC   Post-op Pain Management:    Induction: Intravenous  PONV Risk Score and Plan: 2 and Propofol infusion and Treatment may vary due to age or medical condition  Airway Management Planned: Simple Face Mask  Additional Equipment:   Intra-op Plan:   Post-operative Plan:   Informed Consent: I have reviewed the patients History and Physical, chart, labs and discussed the procedure including the risks, benefits and alternatives for the proposed anesthesia with the patient or authorized representative who has indicated his/her understanding and acceptance.     Dental advisory given  Plan Discussed with: CRNA, Anesthesiologist and Surgeon  Anesthesia Plan Comments: (See PAT note from 2/6 by K Gekas PA-C )        Anesthesia Quick Evaluation

## 2023-10-17 NOTE — H&P (Signed)
 CC/HPI: cc: Urinary frequency/urgency   04/23/2022: 83 year old woman referred for urinary frequency, urgency, and urge incontinence who has failed multiple oral therapies. She has tried both Myrbetriq and Gemtesa without success. She has been on 50 mg of Myrbetriq for years. She wears an incontinence diaper with a pad in it during the day and changes multiple times a day. Her incontinence is all associated with urgency. She has no stress urinary incontinence. She drinks 1 to 2 cups of coffee a day but no carbonation or spicy food. She is try to cut this out before not had any improvement in symptoms. She also gets urinary tract infections and was treated for a UTI with Macrobid last week. Her husband is a patient of Dr. Annitta Kindler. She is history of breast cancer and underwent surgery, chemotherapy and radiation in 1997. She is not on any antiestrogen medication now. No stress urinary incontinence.   Overactive bladder symptom quids: 5, 5, 5, 4, 5, 5, 5, 4=38/40   05/28/22: 83 year old woman with a history of urinary frequency and urgency comes in today for urodynamics results. She also feels like she is starting with a UTI. She been experiencing dysuria and pressure for the last few days. Urinalysis does appear consistent with infection.    UDS SUMMARY  Ms. Fiechter held a max capacity of approx. 172 mls. Her 1st sensation was felt at 109 mls. No SUI noted. There was positive instability. She voided involuntary with urgency off an unstable contraction. He was able to generate a voluntary contraction and void 41 mls with max flow of 5 ml/s. EMG leads were basically quiet during the voiding phase. PVR was approx. 175 mls. No trabeculation was noted. No reflux was seen.    07/06/2022:  Patient returns for 2-week follow-up status post cystoscopy with biopsy. Patient was originally scheduled for vesicular Botox  infiltration for refractory OAB. However, Dr. Valeta Gaudier deferred Botox  at that time due to presence of a  suspicious lesion, which she biopsied. Few days ago, she endorses passage of a small blood clot with some bright red streaking associated. Thankfully, pathology report was benign. Patient remains at baseline urinary function. No interim concerns. She would like to proceed with rescheduling her initial procedure for Botox  at this time.   08/03/2022: 83 year old female who presents today for follow-up after undergoing Botox  approximately 2 weeks ago. She has seen a very slight improvement in her urinary symptoms but overall it has not met her expectations. She continues to get up every 2-3 hours and has urinary frequency throughout the course of the day. She did get a reduced dose of Botox  and is wondering if a little bit more may help augment the results. She is not currently having any fevers or chills. She denies suprapubic pain and pressure. She denies gross hematuria. She does have some dysuria and of her urinary stream. Her PVR is 0 mL.   09/09/2023: 83 year old woman with a history of overactive bladder refractory to medication who underwent 75 units of Botox  in November 2023 here for follow-up. In the interim she was diagnosed with breast cancer and treated with chemotherapy and surgery. Her course was complicated by MRSA infection and she completed chemotherapy last month. She is taking cranberry for UTI prophylaxis. She continues to have overactive bladder with incontinence. Very rare stress incontinence and will occasionally have it when moving from sitting to standing. Her symptoms responded mildly to Botox  and we did a reduced dose because of an elevated PVR on her urodynamic study. She  is interested in repeating this with an increased dose.     ALLERGIES: Erythromycin    MEDICATIONS: Aspirin  325 mg tablet  Metoprolol  Succinate 25 mg tablet, extended release 24 hr  Omeprazole 20 mg capsule,delayed release  Allreds  Citalopram  Hbr 40 mg tablet  Cranrx With Vit C-Mannose  Ezetimibe   Humalog    Meclizine Hcl 25 mg tablet  Ramipril  2.5 mg capsule  Tresiba   Vitamin B12     GU PSH: Complex cystometrogram, w/ void pressure and urethral pressure profile studies, any technique - 05/21/2022 Complex Uroflow - 05/21/2022 Cysto Bladder Ureth Biopsy - 06/22/2022 Cystourethroscopy, W/Injection For Chemodenervation Of Bladder - 07/20/2022 Emg surf Electrd - 05/21/2022 Inject For cystogram - 05/21/2022 Intrabd voidng Press - 05/21/2022     NON-GU PSH: Bilateral Tubal Ligation Breast Biopsy PARTIAL MASTECTOMY     GU PMH: Chronic cystitis (w/o hematuria) - 08/03/2022, - 05/28/2022, - 04/23/2022 Urge incontinence - 08/03/2022, - 07/06/2022, - 05/28/2022, - 05/21/2022, - 04/23/2022 Urinary Frequency - 07/06/2022, - 05/28/2022, - 04/23/2022 Urinary Urgency - 07/06/2022, - 05/28/2022, - 04/23/2022 Acute Cystitis/UTI - 05/28/2022      PMH Notes: Diabetes.  Neuropathy.   NON-GU PMH: Anxiety Arthritis Asthma Breast Cancer, History Depression GERD Heart disease, unspecified Hypercholesterolemia Hypertension Sleep Apnea    FAMILY HISTORY: 1 son - Runs in Family Heart Disease - Father   SOCIAL HISTORY: Marital Status: Married Preferred Language: English; Ethnicity: Not Hispanic Or Latino; Race: White Current Smoking Status: Patient does not smoke anymore.   Tobacco Use Assessment Completed: Used Tobacco in last 30 days? Drinks 2 drinks per day.  Drinks 2 caffeinated drinks per day.    REVIEW OF SYSTEMS:    GU Review Female:   Patient reports burning /pain with urination. Patient denies frequent urination, hard to postpone urination, get up at night to urinate, leakage of urine, stream starts and stops, trouble starting your stream, have to strain to urinate, and being pregnant.  Gastrointestinal (Upper):   Patient denies nausea, vomiting, and indigestion/ heartburn.  Gastrointestinal (Lower):   Patient denies diarrhea and constipation.  Constitutional:   Patient denies fever, night  sweats, weight loss, and fatigue.  Skin:   Patient denies skin rash/ lesion and itching.  Eyes:   Patient denies blurred vision and double vision.  Ears/ Nose/ Throat:   Patient denies sore throat and sinus problems.  Hematologic/Lymphatic:   Patient denies swollen glands and easy bruising.  Cardiovascular:   Patient denies leg swelling and chest pains.  Respiratory:   Patient denies cough and shortness of breath.  Endocrine:   Patient denies excessive thirst.  Musculoskeletal:   Patient denies back pain and joint pain.  Neurological:   Patient denies headaches and dizziness.  Psychologic:   Patient denies depression and anxiety.   VITAL SIGNS:      09/09/2023 11:19 AM  BP 121/67 mmHg  Pulse 89 /min  Temperature 97.3 F / 36.2 C   MULTI-SYSTEM PHYSICAL EXAMINATION:    Constitutional: Well-nourished. No physical deformities. Normally developed. Good grooming.  Neck: Neck symmetrical, not swollen. Normal tracheal position.  Respiratory: No labored breathing, no use of accessory muscles.   Skin: No paleness, no jaundice, no cyanosis. No lesion, no ulcer, no rash.  Neurologic / Psychiatric: Oriented to time, oriented to place, oriented to person. No depression, no anxiety, no agitation.  Eyes: Normal conjunctivae. Normal eyelids.  Ears, Nose, Mouth, and Throat: Left ear no scars, no lesions, no masses. Right ear no scars, no lesions, no masses. Nose  no scars, no lesions, no masses. Normal hearing. Normal lips.  Musculoskeletal: Normal gait and station of head and neck.     Complexity of Data:  Records Review:   Previous Patient Records, POC Tool  Urine Test Review:   Urinalysis   PROCEDURES:          Visit Complexity - G2211          Urinalysis w/Scope Dipstick Dipstick Cont'd Micro  Color: Yellow Bilirubin: Neg mg/dL WBC/hpf: 10 - 40/JWJ  Appearance: Slightly Cloudy Ketones: Neg mg/dL RBC/hpf: 3 - 19/JYN  Specific Gravity: 1.025 Blood: 2+ ery/uL Bacteria: Rare (0-9/hpf)  pH:  <=5.0 Protein: 1+ mg/dL Cystals: NS (Not Seen)  Glucose: Neg mg/dL Urobilinogen: 0.2 mg/dL Casts: NS (Not Seen)    Nitrites: Neg Trichomonas: Not Present    Leukocyte Esterase: 1+ leu/uL Mucous: Not Present      Epithelial Cells: 0 - 5/hpf      Yeast: NS (Not Seen)      Sperm: Not Present    ASSESSMENT:      ICD-10 Details  1 GU:   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 Chronic cystitis: Do not treat asymptomatic bacteriuria. Patient to continue with cranberry. She has stopped vaginal estradiol after breast cancer diagnosis.   2. OAB refractory medication:  -She is interested in repeating Botox  at a higher dose. We discussed the risk of possible urinary retention following this procedure and she understands that. She would like to still proceed. While she does not have significant stress urinary incontinence she does have leakage when moving from sitting to standing. Will hold off on Bulkamid given patient's elevated PVR on urodynamics.   Schedule next available botox  100units

## 2023-10-20 ENCOUNTER — Ambulatory Visit (HOSPITAL_COMMUNITY)
Admission: RE | Admit: 2023-10-20 | Discharge: 2023-10-20 | Disposition: A | Discharge status: Home / Self Care | Payer: Medicare Other | Source: Ambulatory Visit | Attending: Urology | Admitting: Urology

## 2023-10-20 ENCOUNTER — Other Ambulatory Visit: Payer: Self-pay

## 2023-10-20 ENCOUNTER — Encounter (HOSPITAL_COMMUNITY): Payer: Self-pay | Admitting: Urology

## 2023-10-20 ENCOUNTER — Encounter (HOSPITAL_COMMUNITY)
Admission: RE | Disposition: A | Discharge status: Home / Self Care | Payer: Self-pay | Source: Ambulatory Visit | Attending: Urology

## 2023-10-20 ENCOUNTER — Ambulatory Visit (HOSPITAL_COMMUNITY): Payer: Medicare Other | Admitting: Certified Registered Nurse Anesthetist

## 2023-10-20 ENCOUNTER — Ambulatory Visit (HOSPITAL_COMMUNITY): Payer: Self-pay | Admitting: Medical

## 2023-10-20 DIAGNOSIS — Z923 Personal history of irradiation: Secondary | ICD-10-CM | POA: Insufficient documentation

## 2023-10-20 DIAGNOSIS — N3281 Overactive bladder: Secondary | ICD-10-CM | POA: Diagnosis not present

## 2023-10-20 DIAGNOSIS — N3020 Other chronic cystitis without hematuria: Secondary | ICD-10-CM | POA: Diagnosis not present

## 2023-10-20 DIAGNOSIS — E1149 Type 2 diabetes mellitus with other diabetic neurological complication: Secondary | ICD-10-CM

## 2023-10-20 DIAGNOSIS — Z87891 Personal history of nicotine dependence: Secondary | ICD-10-CM | POA: Insufficient documentation

## 2023-10-20 DIAGNOSIS — I10 Essential (primary) hypertension: Secondary | ICD-10-CM

## 2023-10-20 DIAGNOSIS — Z8614 Personal history of Methicillin resistant Staphylococcus aureus infection: Secondary | ICD-10-CM | POA: Insufficient documentation

## 2023-10-20 DIAGNOSIS — N393 Stress incontinence (female) (male): Secondary | ICD-10-CM | POA: Insufficient documentation

## 2023-10-20 DIAGNOSIS — Z853 Personal history of malignant neoplasm of breast: Secondary | ICD-10-CM | POA: Insufficient documentation

## 2023-10-20 DIAGNOSIS — Z9221 Personal history of antineoplastic chemotherapy: Secondary | ICD-10-CM | POA: Diagnosis not present

## 2023-10-20 DIAGNOSIS — I2510 Atherosclerotic heart disease of native coronary artery without angina pectoris: Secondary | ICD-10-CM | POA: Diagnosis not present

## 2023-10-20 DIAGNOSIS — E119 Type 2 diabetes mellitus without complications: Secondary | ICD-10-CM | POA: Diagnosis not present

## 2023-10-20 DIAGNOSIS — Z87440 Personal history of urinary (tract) infections: Secondary | ICD-10-CM | POA: Insufficient documentation

## 2023-10-20 HISTORY — PX: BOTOX INJECTION: SHX5754

## 2023-10-20 HISTORY — PX: CYSTOSCOPY: SHX5120

## 2023-10-20 LAB — GLUCOSE, CAPILLARY
Glucose-Capillary: 124 mg/dL — ABNORMAL HIGH (ref 70–99)
Glucose-Capillary: 140 mg/dL — ABNORMAL HIGH (ref 70–99)

## 2023-10-20 SURGERY — CYSTOSCOPY
Anesthesia: Monitor Anesthesia Care | Site: Bladder

## 2023-10-20 MED ORDER — CHLORHEXIDINE GLUCONATE 0.12 % MT SOLN
15.0000 mL | Freq: Once | OROMUCOSAL | Status: AC
  Administered 2023-10-20: 15 mL via OROMUCOSAL

## 2023-10-20 MED ORDER — FENTANYL CITRATE PF 50 MCG/ML IJ SOSY
PREFILLED_SYRINGE | INTRAMUSCULAR | Status: AC
  Filled 2023-10-20: qty 1

## 2023-10-20 MED ORDER — ONABOTULINUMTOXINA 100 UNITS IJ SOLR
INTRAMUSCULAR | Status: DC | PRN
  Administered 2023-10-20: 100 [IU]

## 2023-10-20 MED ORDER — PROPOFOL 10 MG/ML IV BOLUS
INTRAVENOUS | Status: DC | PRN
  Administered 2023-10-20: 20 mg via INTRAVENOUS
  Administered 2023-10-20: 30 mg via INTRAVENOUS

## 2023-10-20 MED ORDER — OXYCODONE HCL 5 MG PO TABS: 5.0000 mg | ORAL_TABLET | Freq: Once | ORAL | Status: DC | PRN

## 2023-10-20 MED ORDER — FENTANYL CITRATE PF 50 MCG/ML IJ SOSY
25.0000 ug | PREFILLED_SYRINGE | INTRAMUSCULAR | Status: DC | PRN
  Administered 2023-10-20 (×2): 50 ug via INTRAVENOUS

## 2023-10-20 MED ORDER — PROPOFOL 500 MG/50ML IV EMUL
INTRAVENOUS | Status: AC
  Filled 2023-10-20: qty 50

## 2023-10-20 MED ORDER — LIDOCAINE 2% (20 MG/ML) 5 ML SYRINGE
INTRAMUSCULAR | Status: DC | PRN
  Administered 2023-10-20: 60 mg via INTRAVENOUS

## 2023-10-20 MED ORDER — ONDANSETRON HCL 4 MG/2ML IJ SOLN: 4.0000 mg | Freq: Four times a day (QID) | INTRAMUSCULAR | Status: DC | PRN

## 2023-10-20 MED ORDER — SODIUM CHLORIDE (PF) 0.9 % IJ SOLN
INTRAMUSCULAR | Status: AC
  Filled 2023-10-20: qty 50

## 2023-10-20 MED ORDER — FENTANYL CITRATE (PF) 100 MCG/2ML IJ SOLN
INTRAMUSCULAR | Status: AC
  Filled 2023-10-20: qty 2

## 2023-10-20 MED ORDER — ONABOTULINUMTOXINA 100 UNITS IJ SOLR
INTRAMUSCULAR | Status: AC
  Filled 2023-10-20: qty 100

## 2023-10-20 MED ORDER — CEFAZOLIN SODIUM-DEXTROSE 2-4 GM/100ML-% IV SOLN
2.0000 g | INTRAVENOUS | Status: AC
  Administered 2023-10-20: 2 g via INTRAVENOUS
  Filled 2023-10-20: qty 100

## 2023-10-20 MED ORDER — OXYCODONE HCL 5 MG/5ML PO SOLN: 5.0000 mg | Freq: Once | ORAL | Status: DC | PRN

## 2023-10-20 MED ORDER — PROPOFOL 10 MG/ML IV BOLUS
INTRAVENOUS | Status: AC
  Filled 2023-10-20: qty 20

## 2023-10-20 MED ORDER — PROPOFOL 500 MG/50ML IV EMUL
INTRAVENOUS | Status: DC | PRN
  Administered 2023-10-20: 125 ug/kg/min via INTRAVENOUS

## 2023-10-20 MED ORDER — LACTATED RINGERS IV SOLN: INTRAVENOUS | Status: DC

## 2023-10-20 MED ORDER — ONDANSETRON HCL 4 MG/2ML IJ SOLN
INTRAMUSCULAR | Status: AC
  Filled 2023-10-20: qty 2

## 2023-10-20 MED ORDER — ONDANSETRON HCL 4 MG/2ML IJ SOLN
INTRAMUSCULAR | Status: DC | PRN
  Administered 2023-10-20: 4 mg via INTRAVENOUS

## 2023-10-20 MED ORDER — SODIUM CHLORIDE (PF) 0.9 % IJ SOLN
INTRAMUSCULAR | Status: DC | PRN
  Administered 2023-10-20: 10 mL

## 2023-10-20 MED ORDER — INSULIN ASPART 100 UNIT/ML IJ SOLN: 0.0000 [IU] | INTRAMUSCULAR | Status: DC | PRN

## 2023-10-20 MED ORDER — ORAL CARE MOUTH RINSE: 15.0000 mL | Freq: Once | OROMUCOSAL | Status: AC

## 2023-10-20 SURGICAL SUPPLY — 18 items
BAG URO CATCHER STRL LF (MISCELLANEOUS) ×1 IMPLANT
CATH URETL OPEN END 6FR 70 (CATHETERS) ×1 IMPLANT
CLOTH BEACON ORANGE TIMEOUT ST (SAFETY) ×1 IMPLANT
ELECT REM PT RETURN 15FT ADLT (MISCELLANEOUS) ×1 IMPLANT
GLOVE BIO SURGEON STRL SZ 6.5 (GLOVE) ×1 IMPLANT
GOWN STRL REUS W/ TWL LRG LVL3 (GOWN DISPOSABLE) ×1 IMPLANT
GUIDEWIRE STR DUAL SENSOR (WIRE) ×1 IMPLANT
KIT TURNOVER KIT A (KITS) ×1 IMPLANT
MANIFOLD NEPTUNE II (INSTRUMENTS) ×1 IMPLANT
NDL ASPIRATION 22 (NEEDLE) ×1 IMPLANT
NDL SAFETY ECLIPSE 18X1.5 (NEEDLE) ×1 IMPLANT
NEEDLE ASPIRATION 22 (NEEDLE) ×1 IMPLANT
PACK CYSTO (CUSTOM PROCEDURE TRAY) ×1 IMPLANT
SYR 20ML LL LF (SYRINGE) ×1 IMPLANT
SYR CONTROL 10ML LL (SYRINGE) ×1 IMPLANT
TUBING CONNECTING 10 (TUBING) ×1 IMPLANT
TUBING UROLOGY SET (TUBING) ×1 IMPLANT
WATER STERILE IRR 3000ML UROMA (IV SOLUTION) ×1 IMPLANT

## 2023-10-20 NOTE — Transfer of Care (Signed)
Immediate Anesthesia Transfer of Care Note  Patient: Karina Foster  Procedure(s) Performed: CYSTOSCOPY (Bladder) CYSTOSCOPY WITH 100 UNITS OF BOTOX INJECTION (Bladder)  Patient Location: PACU  Anesthesia Type:MAC  Level of Consciousness: awake, alert , and oriented  Airway & Oxygen Therapy: Patient Spontanous Breathing and Patient connected to face mask oxygen  Post-op Assessment: Report given to RN and Post -op Vital signs reviewed and stable  Post vital signs: Reviewed and stable  Last Vitals:  Vitals Value Taken Time  BP 161/83   Temp    Pulse 88 10/20/23 1125  Resp 19 10/20/23 1125  SpO2 100 % 10/20/23 1125  Vitals shown include unfiled device data.  Last Pain:  Vitals:   10/20/23 1021  TempSrc:   PainSc: 2       Patients Stated Pain Goal: 5 (10/20/23 1021)  Complications: No notable events documented.

## 2023-10-20 NOTE — Anesthesia Postprocedure Evaluation (Signed)
Anesthesia Post Note  Patient: ZURRI RUDDEN  Procedure(s) Performed: CYSTOSCOPY (Bladder) CYSTOSCOPY WITH 100 UNITS OF BOTOX INJECTION (Bladder)     Patient location during evaluation: PACU Anesthesia Type: MAC Level of consciousness: awake and alert Pain management: pain level controlled Vital Signs Assessment: post-procedure vital signs reviewed and stable Respiratory status: spontaneous breathing, nonlabored ventilation, respiratory function stable and patient connected to nasal cannula oxygen Cardiovascular status: stable and blood pressure returned to baseline Postop Assessment: no apparent nausea or vomiting Anesthetic complications: no   No notable events documented.  Last Vitals:  Vitals:   10/20/23 1200 10/20/23 1215  BP: (!) 151/77 (!) 158/70  Pulse: 74 74  Resp: 20 15  Temp: 36.4 C 36.7 C  SpO2: 100% 99%    Last Pain:  Vitals:   10/20/23 1215  TempSrc:   PainSc: 0-No pain                 Leone Mobley S

## 2023-10-20 NOTE — Discharge Instructions (Signed)
Cystoscopy with Botox patient instructions  Following a cystoscopy, a catheter (a flexible rubber tube) is sometimes left in place to empty the bladder. This may cause some discomfort or a feeling that you need to urinate. Your doctor determines the period of time that the catheter will be left in place. You may have bloody urine for two to three days (Call your doctor if the amount of bleeding increases or does not subside).  You may pass blood clots in your urine, especially if you had a biopsy. It is not unusual to pass small blood clots and have some bloody urine a couple of weeks after your cystoscopy. Again, call your doctor if the bleeding does not subside. You may have: Dysuria (painful urination) Frequency (urinating often) Urgency (strong desire to urinate)  These symptoms are common especially if medicine is instilled into the bladder or a ureteral stent is placed. Avoiding alcohol and caffeine, such as coffee, tea, and chocolate, may help relieve these symptoms. Drink plenty of water, unless otherwise instructed. Your doctor may also prescribe an antibiotic or other medicine to reduce these symptoms.  Cystoscopy results are available soon after the procedure; biopsy results usually take two to four days. Your doctor will discuss the results of your exam with you. Before you go home, you will be given specific instructions for follow-up care. Special Instructions:  It will take 1-2 weeks for botox to take effect   If you are going home with a catheter in place do not take a tub bath until removed by your doctor.   You may resume your normal activities.   Do not drive or operate machinery if you are taking narcotic pain medicine.   Be sure to keep all follow-up appointments with your doctor.   Call Your Doctor If: The catheter is not draining You have severe pain You are unable to urinate You have a fever over 101 You have severe bleeding

## 2023-10-20 NOTE — Interval H&P Note (Signed)
History and Physical Interval Note:  10/20/2023 10:30 AM  Karina Foster  has presented today for surgery, with the diagnosis of OVERACTIVE BLADDER.  The various methods of treatment have been discussed with the patient and family. After consideration of risks, benefits and other options for treatment, the patient has consented to  Procedure(s) with comments: CYSTOSCOPY (N/A) CYSTOSCOPY WITH 100 UNITS OF BOTOX INJECTION (N/A) - 30 MINUTES NEEDED FOR CASE  100 UNITS OF BOTOX as a surgical intervention.  The patient's history has been reviewed, patient examined, no change in status, stable for surgery.  I have reviewed the patient's chart and labs.  Questions were answered to the patient's satisfaction.     [name] [name]

## 2023-10-20 NOTE — Op Note (Signed)
Operative Note  Preoperative diagnosis:  1.  Overactive bladder  Postoperative diagnosis: 1.  Overactive bladder  Procedure(s): 1.  Cystoscopy with 100units botox  Surgeon: Kasandra Knudsen, MD  Assistants:  None  Anesthesia:  MAC  Complications:  None  EBL:  minimal  Specimens: 1. none  Drains/Catheters: 1.  none  Intraoperative findings:   Normal urethra Bilateral orthotopic Uos Mild erythema consistent with chronic cystitis that has been previously biopsied and fulgurated  Indication:  Karina Foster is a 83 y.o. female with overactive bladder refractory to medication. She has previously undergone botox injections and tolerated it well.  Description of procedure:  After risks and benefits of the procedure were discussed with the patient, informed consent was obtained.  Patient was taken to the operating room placed in supine position.  Anesthesia was induced and antibiotics were administered.  Patient was then repositioned in the dorsolithotomy position.  She was prepped and draped in the usual sterile fashion time was performed.   The injection scope was placed in the meatus and advanced into the bladder.  100 units of Botox were injected over in standard template through the scope on the posterior bladder wall.  Care was taken to avoid the ureteral orifices.  The bladder was decompressed and the resectoscope was removed.  The patient emerged from anesthesia and was transferred to the PACU in stable condition.    Plan:  Follow up in 2 weeks

## 2023-10-21 ENCOUNTER — Encounter (HOSPITAL_COMMUNITY): Payer: Self-pay | Admitting: Urology

## 2023-11-03 ENCOUNTER — Telehealth: Payer: Self-pay | Admitting: *Deleted

## 2023-11-03 DIAGNOSIS — R350 Frequency of micturition: Secondary | ICD-10-CM | POA: Diagnosis not present

## 2023-11-03 DIAGNOSIS — N3941 Urge incontinence: Secondary | ICD-10-CM | POA: Diagnosis not present

## 2023-11-03 NOTE — Telephone Encounter (Signed)
 Called pt to f/u about upcoming SCP appt on Monday 3/3. Left pt a little information over the voice mail. But advised to call office back to receive more info.

## 2023-11-04 ENCOUNTER — Encounter: Payer: Self-pay | Admitting: *Deleted

## 2023-11-07 ENCOUNTER — Encounter: Payer: Self-pay | Admitting: Hematology and Oncology

## 2023-11-07 ENCOUNTER — Other Ambulatory Visit: Payer: Self-pay

## 2023-11-07 ENCOUNTER — Encounter: Payer: Self-pay | Admitting: Adult Health

## 2023-11-07 ENCOUNTER — Inpatient Hospital Stay: Payer: Medicare Other | Attending: Hematology and Oncology | Admitting: Adult Health

## 2023-11-07 VITALS — BP 133/61 | HR 92 | Temp 97.3°F | Resp 18 | Ht 67.0 in | Wt 147.2 lb

## 2023-11-07 DIAGNOSIS — I252 Old myocardial infarction: Secondary | ICD-10-CM | POA: Diagnosis not present

## 2023-11-07 DIAGNOSIS — Z87891 Personal history of nicotine dependence: Secondary | ICD-10-CM | POA: Insufficient documentation

## 2023-11-07 DIAGNOSIS — Z1231 Encounter for screening mammogram for malignant neoplasm of breast: Secondary | ICD-10-CM

## 2023-11-07 DIAGNOSIS — C50411 Malignant neoplasm of upper-outer quadrant of right female breast: Secondary | ICD-10-CM | POA: Insufficient documentation

## 2023-11-07 DIAGNOSIS — Z881 Allergy status to other antibiotic agents status: Secondary | ICD-10-CM | POA: Diagnosis not present

## 2023-11-07 DIAGNOSIS — Z853 Personal history of malignant neoplasm of breast: Secondary | ICD-10-CM | POA: Insufficient documentation

## 2023-11-07 DIAGNOSIS — Z9011 Acquired absence of right breast and nipple: Secondary | ICD-10-CM | POA: Diagnosis not present

## 2023-11-07 DIAGNOSIS — R42 Dizziness and giddiness: Secondary | ICD-10-CM | POA: Diagnosis not present

## 2023-11-07 DIAGNOSIS — Z79899 Other long term (current) drug therapy: Secondary | ICD-10-CM | POA: Diagnosis not present

## 2023-11-07 DIAGNOSIS — Z885 Allergy status to narcotic agent status: Secondary | ICD-10-CM | POA: Insufficient documentation

## 2023-11-07 DIAGNOSIS — Z170 Estrogen receptor positive status [ER+]: Secondary | ICD-10-CM | POA: Diagnosis not present

## 2023-11-07 DIAGNOSIS — Z9221 Personal history of antineoplastic chemotherapy: Secondary | ICD-10-CM | POA: Insufficient documentation

## 2023-11-07 MED ORDER — TIZANIDINE HCL 2 MG PO CAPS: 2.0000 mg | ORAL_CAPSULE | Freq: Three times a day (TID) | ORAL | 0 refills | Status: DC | PRN

## 2023-11-07 NOTE — Progress Notes (Signed)
 SURVIVORSHIP VISIT:  BRIEF ONCOLOGIC HISTORY:  Oncology History  Malignant neoplasm of upper-outer quadrant of right breast in female, estrogen receptor positive (HCC)  08/27/2022 Initial Diagnosis   Screening mammogram detected right breast mass by ultrasound measured 2.3 cm biopsy revealed grade 3 IDC ER 50%, PR 0%, Ki-67 90%, HER2 1+ negative   10/14/2022 Surgery   Right mastectomy: High-grade IDC 3.4 cm with focal sarcomatoid features (metaplastic) 3.4 cm grade 3 with DCIS, lymphovascular invasion present, focal perineural invasion present, margins negative ER 50%, PR 0%, HER2 1+   10/14/2022 Cancer Staging   Staging form: Breast, AJCC 8th Edition - Pathologic stage from 10/14/2022: Stage IIA (pT2, pN0, cM0, G3, ER-, PR-, HER2-) - Signed by Loa Socks, NP on 11/07/2023 Histologic grading system: 3 grade system   11/12/2022 - 07/13/2023 Chemotherapy   Patient is on Treatment Plan : BREAST Adjuvant CMF IV q21d       INTERVAL HISTORY:  Karina Foster to review her survivorship care plan detailing her treatment course for breast cancer, as well as monitoring long-term side effects of that treatment, education regarding health maintenance, screening, and overall wellness and health promotion.     Overall, Karina Foster reports feeling quite well.  She has some balance issues however does the Epley maneuver which helps as she attributes this to longstanding vertigo.  She has opted to forego the guardant testing due to Medicare not covering the test.  REVIEW OF SYSTEMS:  Review of Systems  Constitutional:  Negative for appetite change, chills, fatigue, fever and unexpected weight change.  HENT:   Negative for hearing loss, lump/mass and trouble swallowing.   Eyes:  Negative for eye problems and icterus.  Respiratory:  Negative for chest tightness, cough and shortness of breath.   Cardiovascular:  Negative for chest pain, leg swelling and palpitations.  Gastrointestinal:  Negative for  abdominal distention, abdominal pain, constipation, diarrhea, nausea and vomiting.  Endocrine: Negative for hot flashes.  Genitourinary:  Negative for difficulty urinating.   Musculoskeletal:  Negative for arthralgias.  Skin:  Negative for itching and rash.  Neurological:  Negative for dizziness, extremity weakness, headaches and numbness.  Hematological:  Negative for adenopathy. Does not bruise/bleed easily.  Psychiatric/Behavioral:  Negative for depression. The patient is not nervous/anxious.   Breast: Denies any new nodularity, masses, tenderness, nipple changes, or nipple discharge.       PAST MEDICAL/SURGICAL HISTORY:  Past Medical History:  Diagnosis Date   Allergic rhinitis, seasonal    Arthritis    hands   Asthma    Years ago due to a cat. 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    GERD (gastroesophageal reflux disease)    History of benign neoplasm of tongue    per pt has had several bx's of lesions   History of cancer chemotherapy 1997   right breast cancer  History of external beam radiation therapy 1997   right breast cancer   History of iron deficiency anemia    History of non-ST elevation myocardial infarction (NSTEMI) 12/14/2008   History of right breast cancer 1997   s/p  right partial masectomy node dissection's,  completed chemo and radiation same year ,   no recurrence since   Hyperlipidemia    Mood disorder (HCC)     Myocardial infarction (HCC) 2010   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   OAB (overactive bladder)    OSA (obstructive sleep apnea) 2016   sleep study in epic 10-04-2014, AHI 22.7/ hr;  (06-17-2022  pt stated has not used dental appliance in few years, does not fit )   Peripheral neuropathy    feet   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/2008   x2  to proximal and mid LAD   Urinary incontinence, mixed    urologist--- dr pace   Wears glasses    Wears hearing aid in both ears    Past Surgical History:  Procedure Laterality Date   BOTOX INJECTION N/A 06/22/2022   Procedure: ABORTED BOTOX INJECTION 75 UNITS;  Surgeon: Noel Christmas, MD;  Location: [facility];  Service: Urology;  Laterality: N/A;   BOTOX INJECTION N/A 07/20/2022   Procedure: BOTOX INJECTION;  Surgeon: Noel Christmas, MD;  Location: [facility];  Service: Urology;  Laterality: N/A;   BOTOX INJECTION N/A 10/20/2023   Procedure: CYSTOSCOPY WITH 100 UNITS OF BOTOX INJECTION;  Surgeon: Noel Christmas, MD;  Location: [facility];  Service: Urology;  Laterality: N/A;  30 MINUTES NEEDED FOR CASE  100 UNITS OF BOTOX   BREAST BIOPSY Right 08/27/2022   [place] RT BREAST BX W LOC DEV 1ST LESION IMG BX SPEC US GUIDE 08/27/2022 [facility]   CATARACT EXTRACTION W/ INTRAOCULAR LENS IMPLANT Bilateral 2014   CORONARY ANGIOPLASTY WITH STENT PLACEMENT  12/16/2008   @[facility]   by dr berry;   PCI and stenting to proximal and mid LAD (DES) w/ residual D1 80%  nonobstructive disease involving RCA/ CFx,  ef 50%   CYSTOSCOPY N/A 06/22/2022   Procedure: CYSTOSCOPY, BLADDER BIOPSY, AND FULGERATION;  Surgeon: Noel Christmas, MD;  Location: [facility];  Service: Urology;  Laterality: N/A;  30 MINS   CYSTOSCOPY N/A 07/20/2022   Procedure: CYSTOSCOPY, BLADDER FULGERATION;  Surgeon: Noel Christmas, MD;  Location: [facility];  Service: Urology;  Laterality:  N/A;  30 MINS   CYSTOSCOPY N/A 10/20/2023   Procedure: CYSTOSCOPY;  Surgeon: Noel Christmas, MD;  Location: [facility];  Service: Urology;  Laterality: N/A;   EXCISION OF BREAST BIOPSY Right 1995   IR CV LINE INJECTION  04/21/2023   IR IMAGING GUIDED PORT INSERTION  05/04/2023   IR REMOVAL TUN ACCESS W/ PORT W/O FL MOD SED  05/04/2023   IR REMOVAL TUN ACCESS W/ PORT W/O FL MOD SED  08/26/2023   MASTECTOMY PARTIAL / LUMPECTOMY W/ AXILLARY LYMPHADENECTOMY Right 1997   PORTACATH PLACEMENT N/A 10/14/2022   Procedure: PORT PLACEMENT;  Surgeon: Almond Lint, MD;  Location: [facility];  Service: General;  Laterality: N/A;   TOTAL MASTECTOMY Right 10/14/2022   Procedure: RIGHT MASTECTOMY;  Surgeon: Almond Lint, MD;  Location: [facility];  Service: General;  Laterality: Right;  PEC BLOCK   TUBAL LIGATION Bilateral 1977     ALLERGIES:  Allergies  Allergen Reactions   Azithromycin Rash  Demerol [Meperidine] Diarrhea and Nausea And Vomiting     CURRENT MEDICATIONS:  Outpatient Encounter Medications as of 11/07/2023  Medication Sig Note   aspirin 325 MG tablet Take 325 mg by mouth daily.    BD AUTOSHIELD DUO 30G X 5 MM MISC     BD PEN NEEDLE NANO U/F 32G X 4 MM MISC SMARTSIG:2 Pen Needle SUB-Q Daily    Bempedoic Acid-Ezetimibe (NEXLIZET) 180-10 MG TABS Take 1 tablet by mouth daily.    citalopram (CELEXA) 40 MG tablet Take 40 mg by mouth daily.    Continuous Blood Gluc Sensor (FREESTYLE LIBRE 14 DAY SENSOR) MISC Apply topically every 14 (fourteen) days.    Cranberry Soft 500 MG CHEW Chew 500 mg by mouth daily.    ibuprofen (ADVIL) 200 MG tablet Take 200 mg by mouth every 6 (six) hours as needed for moderate pain.    insulin lispro (HUMALOG) 100 UNIT/ML injection Inject 0.3 mLs (30 Units total) into the skin 3 (three) times daily before meals. (Patient taking differently: Inject 8 Units into the skin 3 (three) times daily before meals. Sliding scale)    metoprolol tartrate (LOPRESSOR) 25 MG tablet TAKE 1/2  TABLET TWICE DAILY (Patient taking differently: Take 25 mg by mouth 2 (two) times daily.)    Multiple Vitamins-Minerals (PRESERVISION AREDS) CAPS Take 1 capsule by mouth in the morning and at bedtime.    nitroGLYCERIN (NITROSTAT) 0.4 MG SL tablet Place 0.4 mg under the tongue every 5 (five) minutes as needed for chest pain. 10/20/2023: Has not needed   NON FORMULARY Take 1 capsule by mouth daily. Femiclear  - herbal supplement    Phenazopyridine HCl (AZO TABS PO) Take 1 tablet by mouth as needed (For UTI).    ramipril (ALTACE) 2.5 MG capsule Take 2.5 mg by mouth daily.    tizanidine (ZANAFLEX) 2 MG capsule Take 1 capsule (2 mg total) by mouth 3 (three) times daily as needed for muscle spasms.    TRESIBA FLEXTOUCH 200 UNIT/ML FlexTouch Pen Inject 32 Units into the skin daily after lunch. We've reduced your insulin dose, please follow up with your PCP outpatient and adjust this further as needed. (Patient taking differently: Inject 64 Units into the skin daily after lunch. We've reduced your insulin dose, please follow up with your PCP outpatient and adjust this further as needed.)    vitamin B-12 (CYANOCOBALAMIN) 1000 MCG tablet Take 1,000 mcg by mouth daily.    No facility-administered encounter medications on file as of 11/07/2023.     ONCOLOGIC FAMILY HISTORY:  Family History  Problem Relation Age of Onset   Osteoporosis Mother    Breast cancer Mother 15   Heart Problems Mother    Other Father        MI   CVA Father    Asthma Father    Diabetes Mellitus II Father    Hypertension Sister    Ovarian cancer Sister 73     SOCIAL HISTORY:  Social History   Socioeconomic History   Marital status: Married    Spouse name: Not on file   Number of children: 1   Years of education: Assoc    Highest education level: Not on file  Occupational History    Comment: retired  Tobacco Use   Smoking status: Former    Current packs/day: 0.00    Average packs/day: 1 pack/day for 20.0 years (20.0  ttl pk-yrs)    Types: Cigarettes    Start date: 47    Quit  date: 63    Years since quitting: 34.1    Passive exposure: Never   Smokeless tobacco: Never  Vaping Use   Vaping status: Never Used  Substance and Sexual Activity   Alcohol use: Yes    Alcohol/week: 7.0 standard drinks of alcohol    Types: 7 Glasses of wine per week    Comment: Wine prior to dinner   Drug use: Never   Sexual activity: Not on file  Other Topics Concern   Not on file  Social History Narrative   3 caffeine drinks a day    Social Drivers of Health   Financial Resource Strain: Not on file  Food Insecurity: No Food Insecurity (11/22/2022)   Hunger Vital Sign    Worried About Running Out of Food in the Last Year: Never true    Ran Out of Food in the Last Year: Never true  Transportation Needs: No Transportation Needs (11/22/2022)   PRAPARE - Administrator, Civil Service (Medical): No    Lack of Transportation (Non-Medical): No  Physical Activity: Not on file  Stress: Not on file  Social Connections: Not on file  Intimate Partner Violence: Not At Risk (11/22/2022)   Humiliation, Afraid, Rape, and Kick questionnaire    Fear of Current or Ex-Partner: No    Emotionally Abused: No    Physically Abused: No    Sexually Abused: No     OBSERVATIONS/OBJECTIVE:  BP 133/61 (BP Location: Left Arm, Patient Position: Sitting)   Pulse 92   Temp (!) 97.3 F (36.3 C) (Temporal)   Resp 18   Ht 5\' 7"  (1.702 m)   Wt 147 lb 3.2 oz (66.8 kg)   SpO2 97%   BMI 23.05 kg/m  GENERAL: Patient is a well appearing female in no acute distress HEENT:  Sclerae anicteric.  Oropharynx clear and moist. No ulcerations or evidence of oropharyngeal candidiasis. Neck is supple.  NODES:  No cervical, supraclavicular, or axillary lymphadenopathy palpated.  BREAST EXAM:  right breast s/p mastetomy, no sign of local recurrence, left bresat benign.   LUNGS:  Clear to auscultation bilaterally.  No wheezes or  rhonchi. HEART:  Regular rate and rhythm. No murmur appreciated. ABDOMEN:  Soft, nontender.  Positive, normoactive bowel sounds. No organomegaly palpated. MSK:  No focal spinal tenderness to palpation. Full range of motion bilaterally in the upper extremities. EXTREMITIES:  No peripheral edema.   SKIN:  Clear with no obvious rashes or skin changes. No nail dyscrasia. NEURO:  Nonfocal. Well oriented.  Appropriate affect.   LABORATORY DATA:  None for this visit.  DIAGNOSTIC IMAGING:  None for this visit.      ASSESSMENT AND PLAN:  Karina Foster is a pleasant 83 y.o. female with Stage IIA right breast invasive ductal carcinoma, ER-/PR-/HER2-, diagnosed in 09/2022, treated with mastectomy and adjuvant chemohterapy.  She presents to the Survivorship Clinic for our initial meeting and routine follow-up post-completion of treatment for breast cancer.   1. Stage IIA right breast cancer:  Ms. Babula is continuing to recover from definitive treatment for breast cancer. She will follow-up with her medical oncologist, Dr.  Pamelia Hoit in 6 months with history and physical exam per surveillance protocol.  She is recommended to undergo guardant testing every 6 months--Medicare did not cover this test for her therefore she opted to forego.   She is recommended to continue annual left breast screening mammograms; orders placed today.   Today, a comprehensive survivorship care plan and treatment summary was reviewed  with the patient today detailing her breast cancer diagnosis, treatment course, potential late/long-term effects of treatment, appropriate follow-up care with recommendations for the future, and patient education resources.  A copy of this summary, along with a letter will be sent to the patient's primary care provider via mail/fax/In Basket message after today's visit.    2.  Family history of breast and ovarian cancer: I recommended she consider meeting with a genetic counselor and undergoing genetic  testing due to her family history.  We discussed how the history of breast and ovarian cancer can also be related to prostate cancer since she does have a 32 year old son.  She would like to wait on undergoing this at this point.  3. Bone health:   She was given education on specific activities to promote bone health.  4. Cancer screening:  Due to Ms. Bromwell's history and her age, she should receive screening for skin cancers, colon cancer, and gynecologic cancers.  The information and recommendations are listed on the patient's comprehensive care plan/treatment summary and were reviewed in detail with the patient.    5. Health maintenance and wellness promotion: Ms. Schmuck was encouraged to consume 5-7 servings of fruits and vegetables per day. We reviewed the "Nutrition Rainbow" handout.  She was also encouraged to engage in moderate to vigorous exercise for 30 minutes per day most days of the week.  She was instructed to limit her alcohol consumption and continue to abstain from tobacco use.     #. Support services/counseling: It is not uncommon for this period of the patient's cancer care trajectory to be one of many emotions and stressors.   She was given information regarding our available services and encouraged to contact me with any questions or for help enrolling in any of our support group/programs.    Follow up instructions:    -Return to cancer center in 6 months for f/u with Dr. Pamelia Hoit  -left breast screening mammogram due  -She is welcome to return back to the Survivorship Clinic at any time; no additional follow-up needed at this time.  -Consider referral back to survivorship as a long-term survivor for continued surveillance  The patient was provided an opportunity to ask questions and all were answered. The patient agreed with the plan and demonstrated an understanding of the instructions.   Total encounter time:45 minutes*in face-to-face visit time, chart review, lab review,  care coordination, order entry, and documentation of the encounter time.    Lillard Anes, NP 11/07/23 3:03 PM Medical Oncology and Hematology [facility] [address] [place], [place] [address] Tel. [phone]    Fax. [phone]  *Total Encounter Time as defined by the Centers for Medicare and Medicaid Services includes, in addition to the face-to-face time of a patient visit (documented in the note above) non-face-to-face time: obtaining and reviewing outside history, ordering and reviewing medications, tests or procedures, care coordination (communications with other health care professionals or caregivers) and documentation in the medical record.

## 2023-11-23 DIAGNOSIS — I10 Essential (primary) hypertension: Secondary | ICD-10-CM | POA: Diagnosis not present

## 2023-11-23 DIAGNOSIS — I2510 Atherosclerotic heart disease of native coronary artery without angina pectoris: Secondary | ICD-10-CM | POA: Diagnosis not present

## 2023-11-23 DIAGNOSIS — Z794 Long term (current) use of insulin: Secondary | ICD-10-CM | POA: Diagnosis not present

## 2023-11-23 DIAGNOSIS — E1149 Type 2 diabetes mellitus with other diabetic neurological complication: Secondary | ICD-10-CM | POA: Diagnosis not present

## 2024-01-12 DIAGNOSIS — R531 Weakness: Secondary | ICD-10-CM | POA: Diagnosis not present

## 2024-01-12 DIAGNOSIS — R0602 Shortness of breath: Secondary | ICD-10-CM | POA: Diagnosis not present

## 2024-01-12 DIAGNOSIS — N390 Urinary tract infection, site not specified: Secondary | ICD-10-CM | POA: Diagnosis not present

## 2024-01-12 DIAGNOSIS — Z1152 Encounter for screening for COVID-19: Secondary | ICD-10-CM | POA: Diagnosis not present

## 2024-01-12 DIAGNOSIS — N179 Acute kidney failure, unspecified: Secondary | ICD-10-CM | POA: Diagnosis not present

## 2024-01-12 DIAGNOSIS — R509 Fever, unspecified: Secondary | ICD-10-CM | POA: Diagnosis not present

## 2024-01-12 DIAGNOSIS — D649 Anemia, unspecified: Secondary | ICD-10-CM | POA: Diagnosis not present

## 2024-01-12 DIAGNOSIS — R300 Dysuria: Secondary | ICD-10-CM | POA: Diagnosis not present

## 2024-01-16 DIAGNOSIS — E038 Other specified hypothyroidism: Secondary | ICD-10-CM | POA: Diagnosis not present

## 2024-01-16 DIAGNOSIS — I10 Essential (primary) hypertension: Secondary | ICD-10-CM | POA: Diagnosis not present

## 2024-01-16 DIAGNOSIS — E1149 Type 2 diabetes mellitus with other diabetic neurological complication: Secondary | ICD-10-CM | POA: Diagnosis not present

## 2024-02-06 DIAGNOSIS — D649 Anemia, unspecified: Secondary | ICD-10-CM | POA: Diagnosis not present

## 2024-02-06 DIAGNOSIS — G4733 Obstructive sleep apnea (adult) (pediatric): Secondary | ICD-10-CM | POA: Diagnosis not present

## 2024-02-06 DIAGNOSIS — C50411 Malignant neoplasm of upper-outer quadrant of right female breast: Secondary | ICD-10-CM | POA: Diagnosis not present

## 2024-02-06 DIAGNOSIS — I10 Essential (primary) hypertension: Secondary | ICD-10-CM | POA: Diagnosis not present

## 2024-02-06 DIAGNOSIS — J45909 Unspecified asthma, uncomplicated: Secondary | ICD-10-CM | POA: Diagnosis not present

## 2024-02-06 DIAGNOSIS — E785 Hyperlipidemia, unspecified: Secondary | ICD-10-CM | POA: Diagnosis not present

## 2024-02-06 DIAGNOSIS — E038 Other specified hypothyroidism: Secondary | ICD-10-CM | POA: Diagnosis not present

## 2024-02-06 DIAGNOSIS — N179 Acute kidney failure, unspecified: Secondary | ICD-10-CM | POA: Diagnosis not present

## 2024-02-06 DIAGNOSIS — N390 Urinary tract infection, site not specified: Secondary | ICD-10-CM | POA: Diagnosis not present

## 2024-02-06 DIAGNOSIS — E1149 Type 2 diabetes mellitus with other diabetic neurological complication: Secondary | ICD-10-CM | POA: Diagnosis not present

## 2024-02-06 DIAGNOSIS — I2510 Atherosclerotic heart disease of native coronary artery without angina pectoris: Secondary | ICD-10-CM | POA: Diagnosis not present

## 2024-02-06 DIAGNOSIS — N3281 Overactive bladder: Secondary | ICD-10-CM | POA: Diagnosis not present

## 2024-02-21 DIAGNOSIS — R350 Frequency of micturition: Secondary | ICD-10-CM | POA: Diagnosis not present

## 2024-02-21 DIAGNOSIS — R8271 Bacteriuria: Secondary | ICD-10-CM | POA: Diagnosis not present

## 2024-02-21 DIAGNOSIS — N3020 Other chronic cystitis without hematuria: Secondary | ICD-10-CM | POA: Diagnosis not present

## 2024-04-03 DIAGNOSIS — N3020 Other chronic cystitis without hematuria: Secondary | ICD-10-CM | POA: Diagnosis not present

## 2024-04-03 DIAGNOSIS — N3941 Urge incontinence: Secondary | ICD-10-CM | POA: Diagnosis not present

## 2024-04-03 DIAGNOSIS — R350 Frequency of micturition: Secondary | ICD-10-CM | POA: Diagnosis not present

## 2024-04-11 DIAGNOSIS — I10 Essential (primary) hypertension: Secondary | ICD-10-CM | POA: Diagnosis not present

## 2024-04-11 DIAGNOSIS — N390 Urinary tract infection, site not specified: Secondary | ICD-10-CM | POA: Diagnosis not present

## 2024-04-11 DIAGNOSIS — E1149 Type 2 diabetes mellitus with other diabetic neurological complication: Secondary | ICD-10-CM | POA: Diagnosis not present

## 2024-04-11 DIAGNOSIS — D649 Anemia, unspecified: Secondary | ICD-10-CM | POA: Diagnosis not present

## 2024-04-11 DIAGNOSIS — N3020 Other chronic cystitis without hematuria: Secondary | ICD-10-CM | POA: Diagnosis not present

## 2024-04-11 DIAGNOSIS — E039 Hypothyroidism, unspecified: Secondary | ICD-10-CM | POA: Diagnosis not present

## 2024-04-11 DIAGNOSIS — E785 Hyperlipidemia, unspecified: Secondary | ICD-10-CM | POA: Diagnosis not present

## 2024-04-16 DIAGNOSIS — R8271 Bacteriuria: Secondary | ICD-10-CM | POA: Diagnosis not present

## 2024-04-16 DIAGNOSIS — N3941 Urge incontinence: Secondary | ICD-10-CM | POA: Diagnosis not present

## 2024-04-16 DIAGNOSIS — R1031 Right lower quadrant pain: Secondary | ICD-10-CM | POA: Diagnosis not present

## 2024-04-16 DIAGNOSIS — N3020 Other chronic cystitis without hematuria: Secondary | ICD-10-CM | POA: Diagnosis not present

## 2024-04-16 DIAGNOSIS — R8279 Other abnormal findings on microbiological examination of urine: Secondary | ICD-10-CM | POA: Diagnosis not present

## 2024-04-16 DIAGNOSIS — R350 Frequency of micturition: Secondary | ICD-10-CM | POA: Diagnosis not present

## 2024-04-18 DIAGNOSIS — G629 Polyneuropathy, unspecified: Secondary | ICD-10-CM | POA: Diagnosis not present

## 2024-04-18 DIAGNOSIS — N3281 Overactive bladder: Secondary | ICD-10-CM | POA: Diagnosis not present

## 2024-04-18 DIAGNOSIS — I2510 Atherosclerotic heart disease of native coronary artery without angina pectoris: Secondary | ICD-10-CM | POA: Diagnosis not present

## 2024-04-18 DIAGNOSIS — I10 Essential (primary) hypertension: Secondary | ICD-10-CM | POA: Diagnosis not present

## 2024-04-18 DIAGNOSIS — C50411 Malignant neoplasm of upper-outer quadrant of right female breast: Secondary | ICD-10-CM | POA: Diagnosis not present

## 2024-04-18 DIAGNOSIS — E1149 Type 2 diabetes mellitus with other diabetic neurological complication: Secondary | ICD-10-CM | POA: Diagnosis not present

## 2024-04-18 DIAGNOSIS — D649 Anemia, unspecified: Secondary | ICD-10-CM | POA: Diagnosis not present

## 2024-04-18 DIAGNOSIS — E785 Hyperlipidemia, unspecified: Secondary | ICD-10-CM | POA: Diagnosis not present

## 2024-04-18 DIAGNOSIS — E038 Other specified hypothyroidism: Secondary | ICD-10-CM | POA: Diagnosis not present

## 2024-04-18 DIAGNOSIS — G4733 Obstructive sleep apnea (adult) (pediatric): Secondary | ICD-10-CM | POA: Diagnosis not present

## 2024-04-18 DIAGNOSIS — Z1339 Encounter for screening examination for other mental health and behavioral disorders: Secondary | ICD-10-CM | POA: Diagnosis not present

## 2024-04-18 DIAGNOSIS — Z1331 Encounter for screening for depression: Secondary | ICD-10-CM | POA: Diagnosis not present

## 2024-04-18 DIAGNOSIS — F39 Unspecified mood [affective] disorder: Secondary | ICD-10-CM | POA: Diagnosis not present

## 2024-04-18 DIAGNOSIS — Z0000 Encounter for general adult medical examination without abnormal findings: Secondary | ICD-10-CM | POA: Diagnosis not present

## 2024-04-18 DIAGNOSIS — J45909 Unspecified asthma, uncomplicated: Secondary | ICD-10-CM | POA: Diagnosis not present

## 2024-04-25 DIAGNOSIS — R3121 Asymptomatic microscopic hematuria: Secondary | ICD-10-CM | POA: Diagnosis not present

## 2024-05-08 ENCOUNTER — Inpatient Hospital Stay: Attending: Hematology and Oncology | Admitting: Hematology and Oncology

## 2024-05-08 VITALS — BP 144/53 | HR 81 | Temp 98.0°F | Resp 18 | Wt 149.1 lb

## 2024-05-08 DIAGNOSIS — Z7982 Long term (current) use of aspirin: Secondary | ICD-10-CM | POA: Insufficient documentation

## 2024-05-08 DIAGNOSIS — Z170 Estrogen receptor positive status [ER+]: Secondary | ICD-10-CM | POA: Insufficient documentation

## 2024-05-08 DIAGNOSIS — Z17421 Hormone receptor negative with human epidermal growth factor receptor 2 negative status: Secondary | ICD-10-CM | POA: Diagnosis not present

## 2024-05-08 DIAGNOSIS — Z1722 Progesterone receptor negative status: Secondary | ICD-10-CM | POA: Insufficient documentation

## 2024-05-08 DIAGNOSIS — Z87440 Personal history of urinary (tract) infections: Secondary | ICD-10-CM | POA: Diagnosis not present

## 2024-05-08 DIAGNOSIS — N3281 Overactive bladder: Secondary | ICD-10-CM | POA: Diagnosis not present

## 2024-05-08 DIAGNOSIS — Z79899 Other long term (current) drug therapy: Secondary | ICD-10-CM | POA: Insufficient documentation

## 2024-05-08 DIAGNOSIS — E119 Type 2 diabetes mellitus without complications: Secondary | ICD-10-CM | POA: Diagnosis not present

## 2024-05-08 DIAGNOSIS — Z794 Long term (current) use of insulin: Secondary | ICD-10-CM | POA: Diagnosis not present

## 2024-05-08 DIAGNOSIS — C50411 Malignant neoplasm of upper-outer quadrant of right female breast: Secondary | ICD-10-CM | POA: Diagnosis not present

## 2024-05-08 DIAGNOSIS — Z9011 Acquired absence of right breast and nipple: Secondary | ICD-10-CM | POA: Insufficient documentation

## 2024-05-08 NOTE — Assessment & Plan Note (Signed)
 10/14/2022:Right mastectomy: High-grade 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 no role of antiestrogen therapy since her repeat prognostic panel was triple negative ---------------------------------------------------------------------------------------------------------------------- Breast Cancer Surveillance: Breast Exam: 05/08/24: Benign  RTC in 1 year

## 2024-05-08 NOTE — Progress Notes (Signed)
 Patient Care Team: Avva, Ravisankar, MD as PCP - General (Internal Medicine) Avva, Ravisankar, MD as Consulting Physician (Internal Medicine) Odean Potts, MD as Consulting Physician (Hematology and Oncology) Dewey Rush, MD as Consulting Physician (Radiation Oncology) Aron Shoulders, MD as Consulting Physician (General Surgery)  DIAGNOSIS:  Encounter Diagnosis  Name Primary?   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) 3.4 cm grade 3 with DCIS, lymphovascular invasion present, focal perineural invasion present, margins negative ER 50%, PR 0%, HER2 1+   10/14/2022 Cancer Staging   Staging form: Breast, AJCC 8th Edition - Pathologic stage from 10/14/2022: Stage IIA (pT2, pN0, cM0, G3, ER-, PR-, HER2-) - Signed by Crawford Morna Pickle, NP on 11/07/2023 Histologic grading system: 3 grade system   11/12/2022 - 07/13/2023 Chemotherapy   Patient is on Treatment Plan : BREAST Adjuvant CMF IV q21d       CHIEF COMPLIANT: Surveillance of breast cancer  HISTORY OF PRESENT ILLNESS:   History of Present Illness Karina Foster is an 83 year old female with diabetes who presents with worsening bladder problems.  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 She previously used vaginal estrogen cream to help prevent UTIs but discontinued it after her breast cancer diagnosis. She experiences frequent urination every thirty minutes, day and night, which she attributes to her diabetes. She has  had four urinary tract infections since June and is on a long-standing low dose of Keflex . Her bladder issues limit her participation in activities at her community, [facility]. She maintains some activity by walking daily, except during extreme heat.     ALLERGIES:  is allergic to azithromycin and demerol [meperidine].  MEDICATIONS:  Current Outpatient Medications  Medication Sig Dispense Refill   aspirin  325 MG tablet Take 325 mg by mouth daily.     BD AUTOSHIELD DUO 30G X 5 MM MISC      BD PEN NEEDLE NANO U/F 32G X 4 MM MISC SMARTSIG:2 Pen Needle SUB-Q Daily     Bempedoic Acid -Ezetimibe  (NEXLIZET ) 180-10 MG TABS Take 1 tablet by mouth daily. 90 tablet 3   citalopram  (CELEXA ) 40 MG tablet Take 40 mg by mouth daily.     Continuous Blood Gluc Sensor (FREESTYLE LIBRE 14 DAY SENSOR) MISC Apply topically every 14 (fourteen) days.     Cranberry Soft 500 MG CHEW Chew 500 mg by mouth daily.     ibuprofen  (ADVIL ) 200 MG tablet Take 200 mg by mouth every 6 (six) hours as needed for moderate pain.     insulin  lispro (HUMALOG ) 100 UNIT/ML injection Inject 0.3 mLs (30 Units total) into the skin 3 (three) times daily before meals. (Patient taking differently: Inject 8 Units into the skin 3 (three) times daily before meals. Sliding scale) 10 mL 11   metoprolol  tartrate (LOPRESSOR ) 25 MG tablet TAKE 1/2 TABLET TWICE DAILY (Patient taking differently: Take 25 mg by mouth 2 (two) times daily.) 180 tablet 3   Multiple Vitamins-Minerals (PRESERVISION AREDS) CAPS Take 1 capsule by mouth in the morning and at bedtime.  nitroGLYCERIN  (NITROSTAT ) 0.4 MG SL tablet Place 0.4 mg under the tongue every 5 (five) minutes as needed for chest pain.     NON FORMULARY Take 1 capsule by mouth daily. Femiclear  - herbal supplement     Phenazopyridine HCl (AZO TABS PO) Take 1 tablet by mouth as needed (For UTI).     ramipril  (ALTACE ) 2.5 MG capsule Take 2.5 mg by mouth daily.     tizanidine  (ZANAFLEX ) 2 MG capsule Take 1  capsule (2 mg total) by mouth 3 (three) times daily as needed for muscle spasms. 15 capsule 0   TRESIBA  FLEXTOUCH 200 UNIT/ML FlexTouch Pen Inject 32 Units into the skin daily after lunch. We've reduced your insulin  dose, please follow up with your PCP outpatient and adjust this further as needed. (Patient taking differently: Inject 64 Units into the skin daily after lunch. We've reduced your insulin  dose, please follow up with your PCP outpatient and adjust this further as needed.)     vitamin B-12 (CYANOCOBALAMIN) 1000 MCG tablet Take 1,000 mcg by mouth daily.     No current facility-administered medications for this visit.    PHYSICAL EXAMINATION: ECOG PERFORMANCE STATUS: 1 - Symptomatic but completely ambulatory  Vitals:   05/08/24 1417  BP: (!) 144/53  Pulse: 81  Resp: 18  Temp: 98 F (36.7 C)  SpO2: 100%   Filed Weights   05/08/24 1417  Weight: 149 lb 1.6 oz (67.6 kg)    Physical Exam No palpable lumps noticed bilateral breast exam  (exam performed in the presence of a chaperone)  LABORATORY DATA:  I have reviewed the data as listed    Latest Ref Rng & Units 10/13/2023    9:30 AM 07/13/2023    8:44 AM 06/21/2023   11:19 AM  CMP  Glucose 70 - 99 mg/dL 818  815  856   BUN 8 - 23 mg/dL 23  20  22    Creatinine 0.44 - 1.00 mg/dL 9.09  9.10  9.03   Sodium 135 - 145 mmol/L 136  140  137   Potassium 3.5 - 5.1 mmol/L 4.1  3.8  4.4   Chloride 98 - 111 mmol/L 101  105  103   CO2 22 - 32 mmol/L 25  26  26    Calcium 8.9 - 10.3 mg/dL 9.2  9.4  9.6   Total Protein 6.5 - 8.1 g/dL  7.2  7.4   Total Bilirubin <1.2 mg/dL  0.4  0.3   Alkaline Phos 38 - 126 U/L  46  49   AST 15 - 41 U/L  13  15   ALT 0 - 44 U/L  11  12     Lab Results  Component Value Date   WBC 4.9 10/13/2023   HGB 12.0 10/13/2023   HCT 37.4 10/13/2023   MCV 98.7 10/13/2023   PLT 240 10/13/2023   NEUTROABS 3.9 07/13/2023    ASSESSMENT & PLAN:  Malignant neoplasm of upper-outer quadrant of right breast in  female, estrogen receptor positive (HCC) 10/14/2022:Right mastectomy: High-grade 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 no role of antiestrogen therapy since her repeat prognostic panel was triple negative ---------------------------------------------------------------------------------------------------------------------- Breast Cancer Surveillance: Breast Exam: 05/08/24: Benign  Recurrent UTIs: I discussed with her that vaginal estrogen cream this might be helpful in reducing the risk of UTIs.  Follows with urology. RTC in 1 year  Assessment & Plan Recurrent urinary tract infections Recurrent UTIs likely related to diabetes. Estrogen cream safe post triple negative breast cancer. - Resume vaginal estrogen cream to help reduce UTI frequency. - Continue long-standing low-dose Keflex  as prescribed by urologist.  Overactive bladder with urinary frequency Urinary frequency likely due to bladder irritability. Cystoscopy planned. Discussed potential nerve implant. - Proceed with cystoscopy as planned with urologist. - Consider nerve implant in the leg if recommended by urology.  History of triple negative breast cancer, status post treatment Triple negative breast cancer post treatment. Declined mammogram by choice. - Respect decision to decline mammogram. - Perform physical examination of the chest for any lumps or bumps.  Type 2 diabetes mellitus Diabetes may contribute to bladder issues.      No orders of the defined types were placed in this encounter.  The patient has a good understanding of the overall plan. she agrees with it. she will call with any problems  that may develop before the next visit here. Total time spent: 30 mins including face to face time and time spent for planning, charting and co-ordination of care   Karina MARLA Chad, MD 05/08/24

## 2024-05-25 DIAGNOSIS — R1031 Right lower quadrant pain: Secondary | ICD-10-CM | POA: Diagnosis not present

## 2024-05-25 DIAGNOSIS — R350 Frequency of micturition: Secondary | ICD-10-CM | POA: Diagnosis not present

## 2024-05-25 DIAGNOSIS — R311 Benign essential microscopic hematuria: Secondary | ICD-10-CM | POA: Diagnosis not present

## 2024-05-25 DIAGNOSIS — R3915 Urgency of urination: Secondary | ICD-10-CM | POA: Diagnosis not present

## 2024-05-25 DIAGNOSIS — N3020 Other chronic cystitis without hematuria: Secondary | ICD-10-CM | POA: Diagnosis not present

## 2024-06-01 ENCOUNTER — Other Ambulatory Visit: Payer: Self-pay | Admitting: Urology

## 2024-06-06 NOTE — H&P (Signed)
 CC/HPI: cc: Urinary frequency/urgency, urinary incontinence, bladder pain   Cystoscopy with bladder biopsy October 2023  Cysto botox  November 2023, February 2025  CT abd/pelvis Septebmer 2025   05/25/2024: 82 year old woman with a history of overactive bladder with poor response to Botox  with acute onset change in symptoms and pain. CT scan showed thickened bladder consistent with chronic cystitis. Patient had no improvement in symptoms adding the daily antibiotic. She was previously on vaginal estradiol but has stopped it due to breast cancer. Her oncologist said that it was okay to restart this. She is here today for cystoscopy.     ALLERGIES: Erythromycin    MEDICATIONS: Bayer Advanced Aspirin  Reg St 325 MG Tablet  Metoprolol  Succinate ER 25 MG Tablet Extended Release 24 Hour  Omeprazole 20 MG Capsule Delayed Release  Allreds  Cephalexin  250 MG Capsule 1 capsule PO Daily  Citalopram  Hydrobromide 40 MG Tablet  Cranrx With Vit C-Mannose  Ezetimibe   Humalog   Meclizine HCl 25 MG Tablet  Ramipril  2.5 MG Capsule  Tresiba   Vitamin B12     GU PSH: Complex cystometrogram, w/ void pressure and urethral pressure profile studies, any technique - 2023 Complex Uroflow - 2023 Cysto Bladder Ureth Biopsy - 06/22/2022 Cystourethroscopy, W/Injection For Chemodenervation Of Bladder - 07/20/2022 Emg surf Electrd - 2023 Inject For cystogram - 2023 Intrabd voidng Press - 2023     NON-GU PSH: Bilateral Tubal Ligation Breast Biopsy PARTIAL MASTECTOMY Visit Complexity (formerly GPC1X) - 04/16/2024, 04/03/2024, 02/21/2024, 11/03/2023, 09/09/2023     GU PMH: Microscopic hematuria - 04/25/2024 RLQ pain - 04/25/2024, - 04/16/2024 Chronic cystitis (w/o hematuria) - 04/16/2024, - 04/03/2024, - 02/21/2024, - 09/09/2023, - 08/03/2022, - 05/28/2022, - 2023 Urge incontinence - 04/16/2024, - 04/03/2024, - 11/03/2023, - 09/09/2023, - 08/03/2022, - 07/06/2022, - 05/28/2022, - 2023, - 2023 Urinary Frequency - 04/16/2024, -  04/03/2024, - 02/21/2024, - 11/03/2023, - 09/09/2023, - 07/06/2022, - 05/28/2022, - 2023 Urinary Urgency - 11/03/2023, - 09/09/2023, - 07/06/2022, - 05/28/2022, - 2023 Acute Cystitis/UTI - 05/28/2022      PMH Notes: Diabetes.  Neuropathy.   NON-GU PMH: Anxiety Arthritis Asthma Breast Cancer, History Depression GERD Heart disease, unspecified Hypercholesterolemia Hypertension Sleep Apnea    FAMILY HISTORY: 1 son - Runs in Family Heart Disease - Father   SOCIAL HISTORY: Marital Status: Married Preferred Language: English; Ethnicity: Not Hispanic Or Latino; Race: White Current Smoking Status: Patient does not smoke anymore.   Tobacco Use Assessment Completed: Used Tobacco in last 30 days? Drinks 2 drinks per day.  Drinks 2 caffeinated drinks per day.    REVIEW OF SYSTEMS:    GU Review Female:   Patient reports frequent urination and get up at night to urinate. Patient denies hard to postpone urination, burning /pain with urination, leakage of urine, stream starts and stops, trouble starting your stream, have to strain to urinate, and being pregnant.  Gastrointestinal (Upper):   Patient denies nausea, vomiting, and indigestion/ heartburn.  Gastrointestinal (Lower):   Patient denies diarrhea and constipation.  Constitutional:   Patient denies fever, night sweats, weight loss, and fatigue.  Skin:   Patient denies skin rash/ lesion and itching.  Eyes:   Patient denies blurred vision and double vision.  Ears/ Nose/ Throat:   Patient denies sore throat and sinus problems.  Hematologic/Lymphatic:   Patient denies swollen glands and easy bruising.  Cardiovascular:   Patient denies leg swelling and chest pains.  Respiratory:   Patient denies cough and shortness of breath.  Endocrine:   Patient denies  excessive thirst.  Musculoskeletal:   Patient denies back pain and joint pain.  Neurological:   Patient denies headaches and dizziness.  Psychologic:   Patient denies depression and anxiety.    Notes: 4-5 times at night    VITAL SIGNS:      05/25/2024 09:43 AM  BP 151/75 mmHg  Pulse 77 /min  Temperature 97.3 F / 36.2 C   GU PHYSICAL EXAMINATION:    External Genitalia: No hirsutism, no rash, no scarring, no cyst, no erythematous lesion, no papular lesion, no blanched lesion, no warty lesion. No edema.  Urethral Meatus: Normal size. Normal position. No discharge.  Vagina: Mild introital stenosis. Moderate atrophy.   MULTI-SYSTEM PHYSICAL EXAMINATION:    Constitutional: Well-nourished. No physical deformities. Normally developed. Good grooming.  Neck: Neck symmetrical, not swollen. Normal tracheal position.  Respiratory: No labored breathing, no use of accessory muscles.   Skin: No paleness, no jaundice, no cyanosis. No lesion, no ulcer, no rash.  Neurologic / Psychiatric: Oriented to time, oriented to place, oriented to person. No depression, no anxiety, no agitation.  Eyes: Normal conjunctivae. Normal eyelids.  Ears, Nose, Mouth, and Throat: Left ear no scars, no lesions, no masses. Right ear no scars, no lesions, no masses. Nose no scars, no lesions, no masses. Normal hearing. Normal lips.  Musculoskeletal: Normal gait and station of head and neck.     Complexity of Data:  Records Review:   Previous Patient Records, POC Tool  Urine Test Review:   Urinalysis  X-Ray Review: C.T. Abdomen/Pelvis: Reviewed Films. Reviewed Report. Discussed With Patient. IMPRESSION:  No radiographic evidence of urinary tract neoplasm, urolithiasis, or  hydronephrosis.   Stable mild diffuse bladder wall thickening, consistent with chronic  cystitis.   Cholelithiasis. No radiographic evidence of cholecystitis.    Electronically Signed  By: Norleen DELENA Kil M.D.  On: 04/26/2024 12:27    PROCEDURES:         Flexible Cystoscopy - 52000  Risks, benefits, and some of the potential complications of the procedure were discussed at length with the patient including infection, bleeding, voiding  discomfort, urinary retention, fever, chills, sepsis, and others. All questions were answered. Informed consent was obtained. Antibiotic prophylaxis was given. Sterile technique and intraurethral analgesia were used.  Meatus:  Normal size. Normal location. Normal condition.  Urethra:  Appears fixed and open with poor mucosa  Ureteral Orifices:  Normal location. Normal size. Normal shape. Effluxed clear urine.  Bladder:  Patchy erythema with edema just behind trigone. This is consistent with prior cystoscopy at which time bladder biopsy was done.      The lower urinary tract was carefully examined. The procedure was well-tolerated and without complications. Antibiotic instructions were given. Instructions were given to call the office immediately for bloody urine, difficulty urinating, urinary retention, painful or frequent urination, fever, chills, nausea, vomiting or other illness. The patient stated that she understood these instructions and would comply with them.         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 [place], [place] [id number]  Phone:  [phone]  Fax:  [phone]            Orders Labs Cytology w/reflex FISH          Document Letter(s):  Created for Patient: Clinical Summary         Notes:   1. Chronic cystitis:  -Patient to continue suppressive antibiotics  - Will schedule cystoscopy with bladder biopsy and fulguration based on increase in pain and appearance of bladder  - Risks and benefits discussed with the patient   2. Urinary incontinence:  - Patient adamantly does not want a Foley catheter open rather manage incontinence depends

## 2024-06-11 NOTE — Patient Instructions (Signed)
 SURGICAL WAITING ROOM VISITATION  Patients having surgery or a procedure may have no more than 2 support people in the waiting area - these visitors may rotate.    Children under the age of 51 must have an adult with them who is not the patient.  Visitors with respiratory illnesses are discouraged from visiting and should remain at home.  If the patient needs to stay at the hospital during part of their recovery, the visitor guidelines for inpatient rooms apply. Pre-op nurse will coordinate an appropriate time for 1 support person to accompany patient in pre-op.  This support person may not rotate.    Please refer to the [facility] website for the visitor guidelines for Inpatients (after your surgery is over and you are in a regular room).    Your procedure is scheduled on: 06/14/24   Report to [facility] Main Entrance    Report to admitting at 5:55 AM   Call this number if you have problems the morning of surgery [phone]   Do not eat food or drink liquids :After Midnight.          If you have questions, please contact your surgeon's office.   FOLLOW BOWEL PREP AND ANY ADDITIONAL PRE OP INSTRUCTIONS YOU RECEIVED FROM YOUR SURGEON'S OFFICE!!!     Oral Hygiene is also important to reduce your risk of infection.                                    Remember - BRUSH YOUR TEETH THE MORNING OF SURGERY WITH YOUR REGULAR TOOTHPASTE  DENTURES WILL BE REMOVED PRIOR TO SURGERY PLEASE DO NOT APPLY Poly grip OR ADHESIVES!!!   Stop all vitamins and herbal supplements 7 days before surgery.   Take these medicines the morning of surgery with A SIP OF WATER : Citalopram , Metoprolol    DO NOT TAKE ANY ORAL DIABETIC MEDICATIONS DAY OF YOUR SURGERY  How to Manage Your Diabetes Before and After Surgery  Why is it important to control my blood sugar before and after surgery? Improving blood sugar levels before and after surgery helps healing and can limit problems. A way of  improving blood sugar control is eating a healthy diet by:  Eating less sugar and carbohydrates  Increasing activity/exercise  Talking with your doctor about reaching your blood sugar goals High blood sugars (greater than 180 mg/dL) can raise your risk of infections and slow your recovery, so you will need to focus on controlling your diabetes during the weeks before surgery. Make sure that the doctor who takes care of your diabetes knows about your planned surgery including the date and location.  How do I manage my blood sugar before surgery? Check your blood sugar at least 4 times a day, starting 2 days before surgery, to make sure that the level is not too high or low. Check your blood sugar the morning of your surgery when you wake up and every 2 hours until you get to the Short Stay unit. If your blood sugar is less than 70 mg/dL, you will need to treat for low blood sugar: Do not take insulin . Treat a low blood sugar (less than 70 mg/dL) with  cup of clear juice (cranberry or apple), 4 glucose tablets, OR glucose gel. Recheck blood sugar in 15 minutes after treatment (to make sure it is greater than 70 mg/dL). If your blood sugar is not greater than 70  mg/dL on recheck, call [phone] for further instructions. Report your blood sugar to the short stay nurse when you get to Short Stay.  If you are admitted to the hospital after surgery: Your blood sugar will be checked by the staff and you will probably be given insulin  after surgery (instead of oral diabetes medicines) to make sure you have good blood sugar levels. The goal for blood sugar control after surgery is 80-180 mg/dL.   WHAT DO I DO ABOUT MY DIABETES MEDICATION?  Do not take oral diabetes medicines (pills) the morning of surgery.  THE DAY BEFORE SURGERY, take Humalog  as prescribed.     THE MORNING OF SURGERY, do not take Humalog  unless blood sugar is greater than 220, then take 50% of normal dose.  If your CBG is  greater than 220 mg/dL, you may take  of your sliding scale  (correction) dose of insulin .  Reviewed and Endorsed by [facility] Patient Education Committee, August 2015             You may not have any metal on your body including hair pins, jewelry, and body piercing             Do not wear make-up, lotions, powders, perfumes/cologne, or deodorant  Do not wear nail polish including gel and S&S, artificial/acrylic nails, or any other type of covering on natural nails including finger and toenails. If you have artificial nails, gel coating, etc. that needs to be removed by a nail salon please have this removed prior to surgery or surgery may need to be canceled/ delayed if the surgeon/ anesthesia feels like they are unable to be safely monitored.   Do not shave  48 hours prior to surgery.    Do not bring valuables to the hospital. [facility] IS NOT             RESPONSIBLE   FOR VALUABLES.   Contacts, glasses, dentures or bridgework may not be worn into surgery.  DO NOT BRING YOUR HOME MEDICATIONS TO THE HOSPITAL. PHARMACY WILL DISPENSE MEDICATIONS LISTED ON YOUR MEDICATION LIST TO YOU DURING YOUR ADMISSION IN THE HOSPITAL!    Patients discharged on the day of surgery will not be allowed to drive home.  Someone NEEDS to stay with you for the first 24 hours after anesthesia.   Special Instructions: Bring a copy of your healthcare power of attorney and living will documents the day of surgery if you haven't scanned them before.              Please read over the following fact sheets you were given: IF YOU HAVE QUESTIONS ABOUT YOUR PRE-OP INSTRUCTIONS PLEASE CALL [phone] [place].   If you received a COVID test during your pre-op visit  it is requested that you wear a mask when out in public, stay away from anyone that may not be feeling well and notify your surgeon if you develop symptoms. If you test positive for Covid or have been in contact with anyone that has tested positive in  the last 10 days please notify you surgeon.    [place] - Preparing for Surgery Before surgery, you can play an important role.  Because skin is not sterile, your skin needs to be as free of germs as possible.  You can reduce the number of germs on your skin by washing with CHG (chlorahexidine gluconate) soap before surgery.  CHG is an antiseptic cleaner which kills germs and bonds with the skin to  continue killing germs even after washing. Please DO NOT use if you have an allergy to CHG or antibacterial soaps.  If your skin becomes reddened/irritated stop using the CHG and inform your nurse when you arrive at Short Stay. Do not shave (including legs and underarms) for at least 48 hours prior to the first CHG shower.  You may shave your face/neck.  Please follow these instructions carefully:  1.  Shower with CHG Soap the night before surgery and the morning of surgery.  2.  If you choose to wash your hair, wash your hair first as usual with your normal  shampoo.  3.  After you shampoo, rinse your hair and body thoroughly to remove the shampoo.                             4.  Use CHG as you would any other liquid soap.  You can apply chg directly to the skin and wash.  Gently with a scrungie or clean washcloth.  5.  Apply the CHG Soap to your body ONLY FROM THE NECK DOWN.   Do   not use on face/ open                           Wound or open sores. Avoid contact with eyes, ears mouth and   genitals (private parts).                       Wash face,  Genitals (private parts) with your normal soap.             6.  Wash thoroughly, paying special attention to the area where your    surgery  will be performed.  7.  Thoroughly rinse your body with warm water  from the neck down.  8.  DO NOT shower/wash with your normal soap after using and rinsing off the CHG Soap.                9.  Pat yourself dry with a clean towel.            10.  Wear clean pajamas.            11.  Place clean sheets on your bed  the night of your first shower and do not  sleep with pets. Day of Surgery : Do not apply any CHG, lotions/deodorants the morning of surgery.  Please wear clean clothes to the hospital/surgery center.  FAILURE TO FOLLOW THESE INSTRUCTIONS MAY RESULT IN THE CANCELLATION OF YOUR SURGERY  PATIENT SIGNATURE_________________________________  NURSE SIGNATURE__________________________________  ________________________________________________________________________

## 2024-06-11 NOTE — Progress Notes (Signed)
 COVID Vaccine Completed: yes  Date of COVID positive in last 90 days:  PCP - Searcy Overcast, MD Cardiologist - Gordy Bergamo, MD LOV 12/23/22  Chest x-ray - N/A EKG - 10/13/23 Epic Stress Test - 09/21/22 Epic ECHO - N/A Cardiac Cath -  Pacemaker/ICD device last checked:N/A Spinal Cord Stimulator:N/A  Bowel Prep - N/A  Sleep Study -  CPAP -   Fasting Blood Sugar -  Checks Blood Sugar _____ times a day  Last dose of GLP1 agonist-  N/A GLP1 instructions:  Do not take after     Last dose of SGLT-2 inhibitors-  N/A SGLT-2 instructions:  Do not take after     Blood Thinner Instructions: N/A Last dose:   Time: Aspirin  Instructions: ASA 325 Last Dose:  Activity level:  Can go up a flight of stairs and perform activities of daily living without stopping and without symptoms of chest pain or shortness of breath.  Able to exercise without symptoms  Unable to go up a flight of stairs without symptoms of     Anesthesia review: HTN, DM2, MI, asthma, OSA  Patient denies shortness of breath, fever, cough and chest pain at PAT appointment  Patient verbalized understanding of instructions that were given to them at the PAT appointment. Patient was also instructed that they will need to review over the PAT instructions again at home before surgery.

## 2024-06-12 ENCOUNTER — Encounter (HOSPITAL_COMMUNITY): Payer: Self-pay

## 2024-06-12 ENCOUNTER — Encounter (HOSPITAL_COMMUNITY)
Admission: RE | Admit: 2024-06-12 | Discharge: 2024-06-12 | Disposition: A | Discharge status: Home / Self Care | Source: Ambulatory Visit | Attending: Urology | Admitting: Urology

## 2024-06-12 ENCOUNTER — Other Ambulatory Visit: Payer: Self-pay

## 2024-06-12 VITALS — BP 152/81 | HR 80 | Temp 98.4°F | Resp 16 | Ht 67.0 in | Wt 147.0 lb

## 2024-06-12 DIAGNOSIS — G4733 Obstructive sleep apnea (adult) (pediatric): Secondary | ICD-10-CM | POA: Diagnosis not present

## 2024-06-12 DIAGNOSIS — Z794 Long term (current) use of insulin: Secondary | ICD-10-CM | POA: Diagnosis not present

## 2024-06-12 DIAGNOSIS — E1149 Type 2 diabetes mellitus with other diabetic neurological complication: Secondary | ICD-10-CM | POA: Diagnosis not present

## 2024-06-12 DIAGNOSIS — Z9221 Personal history of antineoplastic chemotherapy: Secondary | ICD-10-CM | POA: Insufficient documentation

## 2024-06-12 DIAGNOSIS — Z01812 Encounter for preprocedural laboratory examination: Secondary | ICD-10-CM | POA: Insufficient documentation

## 2024-06-12 DIAGNOSIS — Z853 Personal history of malignant neoplasm of breast: Secondary | ICD-10-CM | POA: Insufficient documentation

## 2024-06-12 DIAGNOSIS — Z87891 Personal history of nicotine dependence: Secondary | ICD-10-CM | POA: Insufficient documentation

## 2024-06-12 DIAGNOSIS — I2510 Atherosclerotic heart disease of native coronary artery without angina pectoris: Secondary | ICD-10-CM | POA: Diagnosis not present

## 2024-06-12 DIAGNOSIS — Z955 Presence of coronary angioplasty implant and graft: Secondary | ICD-10-CM | POA: Insufficient documentation

## 2024-06-12 DIAGNOSIS — N3289 Other specified disorders of bladder: Secondary | ICD-10-CM | POA: Diagnosis not present

## 2024-06-12 DIAGNOSIS — Z923 Personal history of irradiation: Secondary | ICD-10-CM | POA: Insufficient documentation

## 2024-06-12 DIAGNOSIS — I10 Essential (primary) hypertension: Secondary | ICD-10-CM | POA: Diagnosis not present

## 2024-06-12 HISTORY — DX: Unspecified dementia, unspecified severity, without behavioral disturbance, psychotic disturbance, mood disturbance, and anxiety: F03.90

## 2024-06-12 LAB — BASIC METABOLIC PANEL WITH GFR
Anion gap: 11 (ref 5–15)
BUN: 21 mg/dL (ref 8–23)
CO2: 25 mmol/L (ref 22–32)
Calcium: 9.6 mg/dL (ref 8.9–10.3)
Chloride: 102 mmol/L (ref 98–111)
Creatinine, Ser: 0.95 mg/dL (ref 0.44–1.00)
GFR, Estimated: 60 mL/min — ABNORMAL LOW (ref >60)
Glucose, Bld: 163 mg/dL — ABNORMAL HIGH (ref 70–99)
Potassium: 4.6 mmol/L (ref 3.5–5.1)
Sodium: 138 mmol/L (ref 135–145)

## 2024-06-12 LAB — CBC
HCT: 38.6 % (ref 36.0–46.0)
Hemoglobin: 12 g/dL (ref 12.0–15.0)
MCH: 30.5 pg (ref 26.0–34.0)
MCHC: 31.1 g/dL (ref 30.0–36.0)
MCV: 98 fL (ref 80.0–100.0)
Platelets: 240 K/uL (ref 150–400)
RBC: 3.94 MIL/uL (ref 3.87–5.11)
RDW: 14.6 % (ref 11.5–15.5)
WBC: 6.1 K/uL (ref 4.0–10.5)
nRBC: 0 % (ref 0.0–0.2)

## 2024-06-12 LAB — GLUCOSE, CAPILLARY: Glucose-Capillary: 176 mg/dL — ABNORMAL HIGH (ref 70–99)

## 2024-06-12 LAB — HEMOGLOBIN A1C
Hgb A1c MFr Bld: 6.9 % — ABNORMAL HIGH (ref 4.8–5.6)
Mean Plasma Glucose: 151.33 mg/dL

## 2024-06-13 NOTE — Progress Notes (Signed)
 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 former smoker with h/o HTN, OSA, insulin  dependent DM II (A1C 6.9), CAD s/p DES to proximal and mid LAD 2010, right breast cancer s/p chemo and radiation 1997 with recurrence and now s/p total mastectomy and chemo (10/2022), abnormal bladder mucosa scheduled for above procedure 06/14/2024 with Dr. Valli Elisabeth.   Patient follows with Oncology for breast cancer. She has a port and completed chemo in November 2024. Last seen in clinic by Dr. Gudena on 08/19/23. She is undergoing cancer surveillance.   Follows with cardiology for history of HTN, HLD, CAD s/p DES x 2 to LAD in 2010.  Last seen by Dr. Ganji 12/23/2022. Per Dr. Ladona: Presently doing well without recurrence of angina pectoris and low risk nuclear stress test with normal LVEF by nuclear stress test.  Continue present medications.   Pt reports at PAT visit she can climb a flight of stairs without chest pain or shortness of breath.   S/p cystoscopy 10/20/23, no anesthesia complications noted.  VS: BP (!) 152/81   Pulse 80   Temp 36.9 C (Oral)   Resp 16   Ht 5' 7 (1.702 m)   Wt 66.7 kg   SpO2 100%   BMI 23.02 kg/m   PROVIDERS: Avva, Ravisankar, MD is PCP    LABS: Labs reviewed: Acceptable for surgery. (all labs ordered are listed, but only abnormal results are displayed)  Labs Reviewed  HEMOGLOBIN A1C - Abnormal; Notable for the following components:      Result Value   Hgb A1c MFr Bld 6.9 (*)    All other components within normal limits  BASIC METABOLIC PANEL WITH GFR - Abnormal; Notable for the  following components:   Glucose, Bld 163 (*)    GFR, Estimated 60 (*)    All other components within normal limits  GLUCOSE, CAPILLARY - Abnormal; Notable for the following components:   Glucose-Capillary 176 (*)    All other components within normal limits  CBC     IMAGES:   EKG:   CV: Exercise Myoview  stress test 09/21/2021: Exercise nuclear stress test was performed using Bruce protocol.   1 Day Rest and Stress images. Exercise time 3 minutes 33 seconds on Bruce protocol, achieved 5.15 METS, 88 % of APMHR. Hypertensive response to exercise: No (rest 150/72, peak 180/80) Stress ECG negative for ischemia. Small size, mild intensity, reversible perfusion defect involving the apical lateral segment, cannot entirely rule out mild ischemia in either the distal RCA/LCx distribution.  Remainder of the myocardial segments illustrate homogenous tracer uptake without evidence of prior infarct. Calculated LVEF 67%, wall thickness and wall motion preserved. Prior Lexiscan  dated 05/23/2017 reported normal perfusion with calculated LVEF 47%. Low risk study.   Past Medical History:  Diagnosis Date   Allergic rhinitis, seasonal    Arthritis    hands   Asthma    Years ago due to a cat. 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    GERD (gastroesophageal reflux disease)    History of benign neoplasm of tongue    per pt has had several bx's of lesions    History of cancer chemotherapy 1997   right breast cancer   History of external beam radiation therapy 1997   right breast cancer   History of iron deficiency anemia    History of non-ST elevation myocardial infarction (NSTEMI) 12/14/2008   History of right breast cancer 1997   s/p  right partial masectomy node dissection's,  completed chemo and radiation same year ,   no recurrence since   Hyperlipidemia    Mood disorder    Myocardial infarction (HCC) 2010   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   OAB (overactive bladder)    OSA (obstructive sleep apnea) 2016   sleep study in epic 10-04-2014, AHI 22.7/ hr;  (06-17-2022  pt stated has not used dental appliance in few years, does not fit )   Peripheral neuropathy    feet   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/2008   x2  to proximal and mid LAD   Urinary incontinence, mixed    urologist--- dr pace   Wears glasses    Wears hearing aid in both ears     Past Surgical History:  Procedure Laterality Date   BOTOX  INJECTION N/A 06/22/2022   Procedure: ABORTED BOTOX  INJECTION 75 UNITS;  Surgeon: Elisabeth Valli BIRCH, MD;  Location: [facility];  Service: Urology;  Laterality: N/A;   BOTOX  INJECTION N/A 07/20/2022   Procedure: BOTOX  INJECTION;  Surgeon: Elisabeth Valli BIRCH, MD;  Location: [facility];  Service: Urology;  Laterality: N/A;   BOTOX  INJECTION N/A 10/20/2023   Procedure: CYSTOSCOPY WITH 100 UNITS OF BOTOX  INJECTION;  Surgeon: Elisabeth Valli BIRCH, MD;  [facility];  Service: Urology;  Laterality: N/A;  30 MINUTES NEEDED FOR CASE  100 UNITS OF BOTOX    BREAST BIOPSY Right 08/27/2022   US  RT BREAST BX W LOC DEV 1ST LESION IMG BX SPEC US  GUIDE 08/27/2022 [facility] MAMMOGRAPHY   CATARACT EXTRACTION W/ INTRAOCULAR LENS IMPLANT Bilateral 2014   CORONARY ANGIOPLASTY WITH STENT PLACEMENT  12/16/2008   @[facility]   by dr berry;   PCI and stenting to proximal and mid LAD (DES) w/ residual D1 80%   nonobstructive disease involving RCA/ CFx,  ef 50%   CYSTOSCOPY N/A 06/22/2022   Procedure: CYSTOSCOPY, BLADDER BIOPSY, AND FULGERATION;  Surgeon: Elisabeth Valli BIRCH, MD;  Location: [facility];  Service: Urology;  Laterality: N/A;  30 MINS   CYSTOSCOPY N/A 07/20/2022   Procedure: CYSTOSCOPY, BLADDER FULGERATION;  Surgeon: Elisabeth Valli BIRCH, MD;  Location: [facility];  Service: Urology;  Laterality: N/A;  30 MINS   CYSTOSCOPY N/A 10/20/2023   Procedure: CYSTOSCOPY;  Surgeon: Elisabeth Valli BIRCH, MD;  Location: [facility];  Service: Urology;  Laterality: N/A;   EXCISION OF BREAST BIOPSY Right 1995   IR CV LINE INJECTION  04/21/2023   IR  IMAGING GUIDED PORT INSERTION  05/04/2023   IR REMOVAL TUN ACCESS W/ PORT W/O FL MOD SED  05/04/2023   IR REMOVAL TUN ACCESS W/ PORT W/O FL MOD SED  08/26/2023   MASTECTOMY PARTIAL / LUMPECTOMY W/ AXILLARY LYMPHADENECTOMY Right 1997   PORTACATH PLACEMENT N/A 10/14/2022   Procedure: PORT PLACEMENT;  Surgeon: Aron Shoulders, MD;  Location: [facility];  Service: General;  Laterality: N/A;   TOTAL MASTECTOMY Right 10/14/2022   Procedure: RIGHT MASTECTOMY;  Surgeon: Aron Shoulders, MD;  Location: [facility];  Service: General;  Laterality: Right;  PEC BLOCK   TUBAL LIGATION Bilateral 1977    MEDICATIONS:  aspirin  325 MG tablet   BD AUTOSHIELD DUO 30G X 5 MM MISC   BD PEN NEEDLE NANO U/F 32G X 4 MM MISC   Bempedoic Acid -Ezetimibe  (NEXLIZET ) 180-10 MG TABS   cephALEXin  (KEFLEX ) 250 MG capsule   citalopram  (CELEXA ) 40 MG tablet   Continuous Blood Gluc Sensor (FREESTYLE LIBRE 14 DAY SENSOR) MISC   estradiol (ESTRACE) 0.1 MG/GM vaginal cream   fluocinonide gel (LIDEX) 0.05 %   ibuprofen  (ADVIL ) 200 MG tablet   insulin  lispro (HUMALOG ) 100 UNIT/ML injection   metoprolol  tartrate (LOPRESSOR ) 25 MG tablet   Multiple Vitamins-Minerals (PRESERVISION AREDS) CAPS   nitroGLYCERIN  (NITROSTAT ) 0.4 MG SL tablet   Phenazopyridine HCl (AZO TABS PO)   ramipril  (ALTACE )  2.5 MG capsule   tizanidine  (ZANAFLEX ) 2 MG capsule   TRESIBA  FLEXTOUCH 200 UNIT/ML FlexTouch Pen   vitamin B-12 (CYANOCOBALAMIN) 1000 MCG tablet   No current facility-administered medications for this encounter.    Harlene Hoots Ward, PA-C [facility] Pre-Surgical Testing [phone]

## 2024-06-18 NOTE — Progress Notes (Signed)
 Attempted to obtain medical history via telephone, unable to reach at this time. HIPAA compliant voicemail message left requesting return call to pre surgical testing department.

## 2024-06-19 ENCOUNTER — Other Ambulatory Visit: Payer: Self-pay | Admitting: Cardiology

## 2024-06-19 NOTE — Progress Notes (Addendum)
 06/19/2024- Attempted to reach pt at [phone] and [phone]. Able to LVMM on [phone] number and asked for call back to review med hx and preop instructions Unable to LVMM on [phone] #due to mailbox full.     PT called back on 06/19/24 at 1545pm and preop instructions were reviewed again with pt with new date and arrival time.  PT voiced understanding.  PT states pt had to reschdule surgery due to husband had a TIA and he is doing good per pt.

## 2024-07-03 ENCOUNTER — Encounter (HOSPITAL_COMMUNITY)
Admission: RE | Disposition: A | Discharge status: Home / Self Care | Payer: Self-pay | Source: Ambulatory Visit | Attending: Urology

## 2024-07-03 ENCOUNTER — Encounter (HOSPITAL_COMMUNITY): Payer: Self-pay | Admitting: Urology

## 2024-07-03 ENCOUNTER — Encounter (HOSPITAL_COMMUNITY): Payer: Self-pay | Admitting: Anesthesiology

## 2024-07-03 ENCOUNTER — Other Ambulatory Visit: Payer: Self-pay | Admitting: Urology

## 2024-07-03 ENCOUNTER — Encounter (HOSPITAL_COMMUNITY): Payer: Self-pay | Admitting: Medical

## 2024-07-03 ENCOUNTER — Ambulatory Visit (HOSPITAL_COMMUNITY)
Admission: RE | Admit: 2024-07-03 | Discharge: 2024-07-03 | Disposition: A | Discharge status: Home / Self Care | Source: Ambulatory Visit | Attending: Urology | Admitting: Urology

## 2024-07-03 DIAGNOSIS — E1149 Type 2 diabetes mellitus with other diabetic neurological complication: Secondary | ICD-10-CM

## 2024-07-03 LAB — GLUCOSE, CAPILLARY: Glucose-Capillary: 169 mg/dL — ABNORMAL HIGH (ref 70–99)

## 2024-07-03 SURGERY — TURBT (TRANSURETHRAL RESECTION OF BLADDER TUMOR)
Anesthesia: General

## 2024-07-03 MED ORDER — ORAL CARE MOUTH RINSE: 15.0000 mL | Freq: Once | OROMUCOSAL | Status: DC

## 2024-07-03 MED ORDER — LIDOCAINE HCL (PF) 2 % IJ SOLN
INTRAMUSCULAR | Status: AC
  Filled 2024-07-03: qty 5

## 2024-07-03 MED ORDER — ONDANSETRON HCL 4 MG/2ML IJ SOLN
INTRAMUSCULAR | Status: AC
Start: 2024-07-03 — End: 2024-07-03
  Filled 2024-07-03: qty 2

## 2024-07-03 MED ORDER — LACTATED RINGERS IV SOLN: INTRAVENOUS | Status: DC

## 2024-07-03 MED ORDER — INSULIN ASPART 100 UNIT/ML IJ SOLN: 0.0000 [IU] | INTRAMUSCULAR | Status: DC | PRN

## 2024-07-03 MED ORDER — CHLORHEXIDINE GLUCONATE 0.12 % MT SOLN: 15.0000 mL | Freq: Once | OROMUCOSAL | Status: DC

## 2024-07-03 MED ORDER — CEFAZOLIN SODIUM-DEXTROSE 2-4 GM/100ML-% IV SOLN: 2.0000 g | INTRAVENOUS | Status: DC

## 2024-07-03 MED ORDER — FENTANYL CITRATE (PF) 100 MCG/2ML IJ SOLN
INTRAMUSCULAR | Status: AC
  Filled 2024-07-03: qty 2

## 2024-07-03 NOTE — Progress Notes (Signed)
 Dr. Elisabeth aware that patient did not stop 325 of ASA and per Dr. Elisabeth, surgery will be canceled .

## 2024-07-03 NOTE — Anesthesia Preprocedure Evaluation (Addendum)
 Anesthesia Evaluation  Patient identified by MRN, date of birth, ID band Patient awake    Reviewed: Allergy & Precautions, NPO status , Patient's Chart, lab work & pertinent test results  History of Anesthesia Complications Negative for: history of anesthetic complications  Airway        Dental   Pulmonary former smoker          Cardiovascular hypertension, + CAD and + Past MI    NM Myocardial Perfusion Scan:  Stress ECG negative for ischemia. Small size, mild intensity, reversible perfusion defect involving the apical lateral segment, cannot entirely rule out mild ischemia in either the distal RCA/LCx distribution.  Remainder of the myocardial segments illustrate homogenous tracer uptake without evidence of prior infarct. Calculated LVEF 67%, wall thickness and wall motion preserved.    Neuro/Psych    GI/Hepatic ,GERD  ,,  Endo/Other  diabetes    Renal/GU      Musculoskeletal   Abdominal   Peds  Hematology   Anesthesia Other Findings   Reproductive/Obstetrics                              Anesthesia Physical Anesthesia Plan  ASA: 3  Anesthesia Plan: General   Post-op Pain Management:    Induction: Intravenous  PONV Risk Score and Plan: 1  Airway Management Planned: Oral ETT and LMA  Additional Equipment:   Intra-op Plan:   Post-operative Plan: Extubation in OR  Informed Consent:      Dental advisory given  Plan Discussed with: CRNA and Surgeon  Anesthesia Plan Comments:         Anesthesia Quick Evaluation

## 2024-07-05 ENCOUNTER — Encounter (HOSPITAL_COMMUNITY): Payer: Self-pay

## 2024-07-05 NOTE — Progress Notes (Signed)
 COVID Vaccine Completed:  Date of COVID positive in last 90 days:  PCP - Searcy Overcast, MD Cardiologist - Gordy Bergamo, MD  Chest x-ray - N/A EKG - 10-13-23 Epic Stress Test - 09-21-22 Epic ECHO - N/A Cardiac Cath -  Pacemaker/ICD device last checked:N/A Spinal Cord Stimulator:N/A  Bowel Prep - N/A  Sleep Study - Yes, +sleep apnea CPAP -   Fasting Blood Sugar - N/A Checks Blood Sugar _____ times a day  Last dose of GLP1 agonist-  N/A GLP1 instructions:  Do not take after     Last dose of SGLT-2 inhibitors-  N/A SGLT-2 instructions:  Do not take after     Blood Thinner Instructions: N/A Last dose:   Time: Aspirin  Instructions:N/A Last Dose:  Activity level:  Can go up a flight of stairs and perform activities of daily living without stopping and without symptoms of chest pain or shortness of breath.  Able to exercise without symptoms  Unable to go up a flight of stairs without symptoms of     Anesthesia review: CAD, hx of MI, HTN, DM  Patient denies shortness of breath, fever, cough and chest pain at PAT appointment  Patient verbalized understanding of instructions that were given to them at the PAT appointment. Patient was also instructed that they will need to review over the PAT instructions again at home before surgery.

## 2024-07-05 NOTE — Patient Instructions (Addendum)
 SURGICAL WAITING ROOM VISITATION Patients having surgery or a procedure may have no more than 2 support people in the waiting area - these visitors may rotate.    Children under the age of 63 must have an adult with them who is not the patient.  If the patient needs to stay at the hospital during part of their recovery, the visitor guidelines for inpatient rooms apply. Pre-op nurse will coordinate an appropriate time for 1 support person to accompany patient in pre-op.  This support person may not rotate.    Please refer to the [facility] website for the visitor guidelines for Inpatients (after your surgery is over and you are in a regular room).      Your procedure is scheduled on: 07-12-24   Report to [facility] Main Entrance    Report to admitting at 6:00 AM   Call this number if you have problems the morning of surgery [phone]   Do not eat food or drink liquids :After Midnight.           If you have questions, please contact your surgeon's office.  FOLLOW ANY ADDITIONAL PRE OP INSTRUCTIONS YOU RECEIVED FROM YOUR SURGEON'S OFFICE!!!     Oral Hygiene is also important to reduce your risk of infection.                                    Remember - BRUSH YOUR TEETH THE MORNING OF SURGERY WITH YOUR REGULAR TOOTHPASTE   Do NOT smoke after Midnight   Take these medicines the morning of surgery with A SIP OF WATER :    Cephalexin    Citalopram    Metoprolol    Tylenol  if needed  Stop all vitamins and herbal supplements 7 days before surgery  How to Manage Your Diabetes Before and After Surgery  Why is it important to control my blood sugar before and after surgery? Improving blood sugar levels before and after surgery helps healing and can limit problems. A way of improving blood sugar control is eating a healthy diet by:  Eating less sugar and carbohydrates  Increasing activity/exercise  Talking with your doctor about reaching your blood sugar goals High blood  sugars (greater than 180 mg/dL) can raise your risk of infections and slow your recovery, so you will need to focus on controlling your diabetes during the weeks before surgery. Make sure that the doctor who takes care of your diabetes knows about your planned surgery including the date and location.  How do I manage my blood sugar before surgery? Check your blood sugar at least 4 times a day, starting 2 days before surgery, to make sure that the level is not too high or low. Check your blood sugar the morning of your surgery when you wake up and every 2 hours until you get to the Short Stay unit. If your blood sugar is less than 70 mg/dL, you will need to treat for low blood sugar: Do not take insulin . Treat a low blood sugar (less than 70 mg/dL) with  cup of clear juice (cranberry or apple), 4 glucose tablets, OR glucose gel. Recheck blood sugar in 15 minutes after treatment (to make sure it is greater than 70 mg/dL). If your blood sugar is not greater than 70 mg/dL on recheck, call [phone] for further instructions. Report your blood sugar to the short stay nurse when you get to Short Stay.  If you  are admitted to the hospital after surgery: Your blood sugar will be checked by the staff and you will probably be given insulin  after surgery (instead of oral diabetes medicines) to make sure you have good blood sugar levels. The goal for blood sugar control after surgery is 80-180 mg/dL.   WHAT DO I DO ABOUT MY DIABETES MEDICATION?  Do not take oral diabetes medicines (pills) the morning of surgery.  THE NIGHT BEFORE SURGERY:  No bedtime dose of Humalog .   THE MORNING OF SURGERY, If your CBG is greater than 220 mg/dL, you may take  of your sliding scale  (correction) dose of Humalog  insulin .  DO NOT TAKE THE FOLLOWING 7 DAYS PRIOR TO SURGERY: Ozempic, Wegovy, Rybelsus (Semaglutide), Byetta (exenatide), Bydureon (exenatide ER), Victoza, Saxenda (liraglutide), or Trulicity (dulaglutide)  Mounjaro (Tirzepatide) Adlyxin (Lixisenatide), Polyethylene Glycol Loxenatide.    Reviewed and Endorsed by [facility] Patient Education Committee, August 2015                              You may not have any metal on your body including hair pins, jewelry, and body piercing             Do not wear make-up, lotions, powders, perfumes, or deodorant  Do not wear nail polish including gel and S&S, artificial/acrylic nails, or any other type of covering on natural nails including finger and toenails. If you have artificial nails, gel coating, etc. that needs to be removed by a nail salon please have this removed prior to surgery or surgery may need to be canceled/ delayed if the surgeon/ anesthesia feels like they are unable to be safely monitored.   Do not shave  48 hours prior to surgery.          Do not bring valuables to the hospital. [facility] IS NOT RESPONSIBLE   FOR VALUABLES.   Contacts, dentures or bridgework may not be worn into surgery.  DO NOT BRING YOUR HOME MEDICATIONS TO THE HOSPITAL. PHARMACY WILL DISPENSE MEDICATIONS LISTED ON YOUR MEDICATION LIST TO YOU DURING YOUR ADMISSION IN THE HOSPITAL!    Patients discharged on the day of surgery will not be allowed to drive home.  Someone NEEDS to stay with you for the first 24 hours after anesthesia.   Special Instructions: Bring a copy of your healthcare power of attorney and living will documents the day of surgery if you haven't scanned them before.              Please read over the following fact sheets you were given: IF YOU HAVE QUESTIONS ABOUT YOUR PRE-OP INSTRUCTIONS PLEASE CALL [phone]  If you received a COVID test during your pre-op visit  it is requested that you wear a mask when out in public, stay away from anyone that may not be feeling well and notify your surgeon if you develop symptoms. If you test positive for Covid or have been in contact with anyone that has tested positive in the last 10 days please notify  you surgeon.  [facility] - Preparing for Surgery Before surgery, you can play an important role.  Because skin is not sterile, your skin needs to be as free of germs as possible.  You can reduce the number of germs on your skin by washing with CHG (chlorahexidine gluconate) soap before surgery.  CHG is an antiseptic cleaner which kills germs and bonds with the skin to continue killing  germs even after washing. Please DO NOT use if you have an allergy to CHG or antibacterial soaps.  If your skin becomes reddened/irritated stop using the CHG and inform your nurse when you arrive at Short Stay. Do not shave (including legs and underarms) for at least 48 hours prior to the first CHG shower.  You may shave your face/neck.  Please follow these instructions carefully:  1.  Shower with CHG Soap the night before surgery and the  morning of surgery.  2.  If you choose to wash your hair, wash your hair first as usual with your normal  shampoo.  3.  After you shampoo, rinse your hair and body thoroughly to remove the shampoo.                             4.  Use CHG as you would any other liquid soap.  You can apply chg directly to the skin and wash.  Gently with a scrungie or clean washcloth.  5.  Apply the CHG Soap to your body ONLY FROM THE NECK DOWN.   Do   not use on face/ open                           Wound or open sores. Avoid contact with eyes, ears mouth and   genitals (private parts).                       Wash face,  Genitals (private parts) with your normal soap.             6.  Wash thoroughly, paying special attention to the area where your    surgery  will be performed.  7.  Thoroughly rinse your body with warm water  from the neck down.  8.  DO NOT shower/wash with your normal soap after using and rinsing off the CHG Soap.                9.  Pat yourself dry with a clean towel.            10.  Wear clean pajamas.            11.  Place clean sheets on your bed the night of your first shower  and do not  sleep with pets. Day of Surgery : Do not apply any lotions/deodorants the morning of surgery.  Please wear clean clothes to the hospital/surgery center.  FAILURE TO FOLLOW THESE INSTRUCTIONS MAY RESULT IN THE CANCELLATION OF YOUR SURGERY  PATIENT SIGNATURE_________________________________  NURSE SIGNATURE__________________________________  ________________________________________________________________________

## 2024-07-09 ENCOUNTER — Encounter (HOSPITAL_COMMUNITY)
Admission: RE | Admit: 2024-07-09 | Discharge: 2024-07-09 | Disposition: A | Discharge status: Home / Self Care | Source: Ambulatory Visit | Attending: Internal Medicine | Admitting: Internal Medicine

## 2024-07-09 DIAGNOSIS — D649 Anemia, unspecified: Secondary | ICD-10-CM

## 2024-07-09 DIAGNOSIS — E119 Type 2 diabetes mellitus without complications: Secondary | ICD-10-CM

## 2024-07-09 HISTORY — DX: Anemia, unspecified: D64.9

## 2024-07-09 NOTE — Progress Notes (Signed)
 Patient phoned to give updated information on surgery.  Date of Surgery - 07-12-24  Arrival Time - 6:00 and check in at admitting.    NPO Status - patient reminded to not eat solid food or drink liquids after midnight.    Medications morning of surgery - Citalopram , Metoprolol , Cephalexin  and Tylenol  if needed.    Patient states that last dose of Aspirin  325 was 07/06/24 and she was reminded to not take until after surgery.  Patient also advised to stop Ibuprofen  which she has been taking for pain and to not take Ramipril  the morning of surgery.   Also reminded patient to only do 1/2 dose of Humalog  if blood sugar 220 or higher.    No change in medical history, allergies per patient.  Transportation home - [employer].    All questions answered and patient stated understanding

## 2024-07-11 ENCOUNTER — Encounter (HOSPITAL_COMMUNITY): Payer: Self-pay | Admitting: Urology

## 2024-07-12 ENCOUNTER — Ambulatory Visit (HOSPITAL_COMMUNITY): Payer: Self-pay | Admitting: Certified Registered Nurse Anesthetist

## 2024-07-12 ENCOUNTER — Encounter (HOSPITAL_COMMUNITY)
Admission: RE | Disposition: A | Discharge status: Home / Self Care | Payer: Self-pay | Source: Ambulatory Visit | Attending: Urology

## 2024-07-12 ENCOUNTER — Ambulatory Visit (HOSPITAL_COMMUNITY)
Admission: RE | Admit: 2024-07-12 | Discharge: 2024-07-12 | Disposition: A | Discharge status: Home / Self Care | Source: Ambulatory Visit | Attending: Urology | Admitting: Urology

## 2024-07-12 ENCOUNTER — Other Ambulatory Visit: Payer: Self-pay

## 2024-07-12 ENCOUNTER — Ambulatory Visit (HOSPITAL_BASED_OUTPATIENT_CLINIC_OR_DEPARTMENT_OTHER): Payer: Self-pay | Admitting: Certified Registered Nurse Anesthetist

## 2024-07-12 ENCOUNTER — Encounter (HOSPITAL_COMMUNITY): Payer: Self-pay | Admitting: Urology

## 2024-07-12 DIAGNOSIS — R3989 Other symptoms and signs involving the genitourinary system: Secondary | ICD-10-CM | POA: Diagnosis not present

## 2024-07-12 DIAGNOSIS — R3915 Urgency of urination: Secondary | ICD-10-CM | POA: Diagnosis not present

## 2024-07-12 DIAGNOSIS — I252 Old myocardial infarction: Secondary | ICD-10-CM | POA: Diagnosis not present

## 2024-07-12 DIAGNOSIS — Z794 Long term (current) use of insulin: Secondary | ICD-10-CM | POA: Insufficient documentation

## 2024-07-12 DIAGNOSIS — E119 Type 2 diabetes mellitus without complications: Secondary | ICD-10-CM

## 2024-07-12 DIAGNOSIS — D649 Anemia, unspecified: Secondary | ICD-10-CM

## 2024-07-12 DIAGNOSIS — I2510 Atherosclerotic heart disease of native coronary artery without angina pectoris: Secondary | ICD-10-CM | POA: Insufficient documentation

## 2024-07-12 DIAGNOSIS — E1142 Type 2 diabetes mellitus with diabetic polyneuropathy: Secondary | ICD-10-CM | POA: Diagnosis not present

## 2024-07-12 DIAGNOSIS — N3020 Other chronic cystitis without hematuria: Secondary | ICD-10-CM | POA: Diagnosis not present

## 2024-07-12 DIAGNOSIS — Z87891 Personal history of nicotine dependence: Secondary | ICD-10-CM | POA: Diagnosis not present

## 2024-07-12 DIAGNOSIS — R350 Frequency of micturition: Secondary | ICD-10-CM | POA: Insufficient documentation

## 2024-07-12 DIAGNOSIS — D494 Neoplasm of unspecified behavior of bladder: Secondary | ICD-10-CM | POA: Diagnosis not present

## 2024-07-12 DIAGNOSIS — N3289 Other specified disorders of bladder: Secondary | ICD-10-CM | POA: Diagnosis not present

## 2024-07-12 DIAGNOSIS — Z955 Presence of coronary angioplasty implant and graft: Secondary | ICD-10-CM | POA: Insufficient documentation

## 2024-07-12 DIAGNOSIS — F32A Depression, unspecified: Secondary | ICD-10-CM | POA: Insufficient documentation

## 2024-07-12 DIAGNOSIS — G4733 Obstructive sleep apnea (adult) (pediatric): Secondary | ICD-10-CM | POA: Insufficient documentation

## 2024-07-12 DIAGNOSIS — I10 Essential (primary) hypertension: Secondary | ICD-10-CM | POA: Insufficient documentation

## 2024-07-12 DIAGNOSIS — R896 Abnormal cytological findings in specimens from other organs, systems and tissues: Secondary | ICD-10-CM | POA: Diagnosis not present

## 2024-07-12 HISTORY — PX: TRANSURETHRAL RESECTION OF BLADDER TUMOR: SHX2575

## 2024-07-12 LAB — GLUCOSE, CAPILLARY
Glucose-Capillary: 175 mg/dL — ABNORMAL HIGH (ref 70–99)
Glucose-Capillary: 182 mg/dL — ABNORMAL HIGH (ref 70–99)

## 2024-07-12 SURGERY — TURBT (TRANSURETHRAL RESECTION OF BLADDER TUMOR)
Anesthesia: General | Site: Bladder

## 2024-07-12 MED ORDER — SUGAMMADEX SODIUM 200 MG/2ML IV SOLN
INTRAVENOUS | Status: AC
  Filled 2024-07-12: qty 2

## 2024-07-12 MED ORDER — ORAL CARE MOUTH RINSE
15.0000 mL | Freq: Once | OROMUCOSAL | Status: AC
Start: 2024-07-12 — End: 2024-07-12

## 2024-07-12 MED ORDER — FENTANYL CITRATE (PF) 100 MCG/2ML IJ SOLN
INTRAMUSCULAR | Status: DC | PRN
  Administered 2024-07-12: 100 ug via INTRAVENOUS
  Administered 2024-07-12 (×2): 50 ug via INTRAVENOUS

## 2024-07-12 MED ORDER — LIDOCAINE HCL (PF) 2 % IJ SOLN
INTRAMUSCULAR | Status: DC | PRN
  Administered 2024-07-12: 100 mg via INTRADERMAL

## 2024-07-12 MED ORDER — PROPOFOL 10 MG/ML IV BOLUS
INTRAVENOUS | Status: AC
  Filled 2024-07-12: qty 20

## 2024-07-12 MED ORDER — STERILE WATER FOR IRRIGATION IR SOLN
Status: DC | PRN
  Administered 2024-07-12: 1000 mL

## 2024-07-12 MED ORDER — ROCURONIUM BROMIDE 10 MG/ML (PF) SYRINGE
PREFILLED_SYRINGE | INTRAVENOUS | Status: DC | PRN
  Administered 2024-07-12: 70 mg via INTRAVENOUS

## 2024-07-12 MED ORDER — ACETAMINOPHEN 10 MG/ML IV SOLN
INTRAVENOUS | Status: AC
  Filled 2024-07-12: qty 100

## 2024-07-12 MED ORDER — INSULIN ASPART 100 UNIT/ML IJ SOLN: 0.0000 [IU] | INTRAMUSCULAR | Status: DC | PRN

## 2024-07-12 MED ORDER — OXYCODONE HCL 5 MG PO TABS
ORAL_TABLET | ORAL | Status: AC
  Filled 2024-07-12: qty 1

## 2024-07-12 MED ORDER — TRAMADOL HCL 50 MG PO TABS: 50.0000 mg | ORAL_TABLET | Freq: Four times a day (QID) | ORAL | 0 refills | Status: AC | PRN

## 2024-07-12 MED ORDER — CEFAZOLIN SODIUM-DEXTROSE 2-4 GM/100ML-% IV SOLN
2.0000 g | INTRAVENOUS | Status: AC
  Administered 2024-07-12: 2 g via INTRAVENOUS
  Filled 2024-07-12: qty 100

## 2024-07-12 MED ORDER — LIDOCAINE HCL (PF) 2 % IJ SOLN
INTRAMUSCULAR | Status: AC
Start: 2024-07-12 — End: 2024-07-12
  Filled 2024-07-12: qty 5

## 2024-07-12 MED ORDER — PHENYLEPHRINE 80 MCG/ML (10ML) SYRINGE FOR IV PUSH (FOR BLOOD PRESSURE SUPPORT)
PREFILLED_SYRINGE | INTRAVENOUS | Status: DC | PRN
  Administered 2024-07-12: 80 ug via INTRAVENOUS

## 2024-07-12 MED ORDER — SUGAMMADEX SODIUM 200 MG/2ML IV SOLN
INTRAVENOUS | Status: DC | PRN
  Administered 2024-07-12: 200 mg via INTRAVENOUS

## 2024-07-12 MED ORDER — OXYCODONE HCL 5 MG/5ML PO SOLN: 5.0000 mg | Freq: Once | ORAL | Status: AC | PRN

## 2024-07-12 MED ORDER — ONDANSETRON HCL 4 MG/2ML IJ SOLN: 4.0000 mg | Freq: Once | INTRAMUSCULAR | Status: DC | PRN

## 2024-07-12 MED ORDER — ACETAMINOPHEN 10 MG/ML IV SOLN
1000.0000 mg | Freq: Once | INTRAVENOUS | Status: DC | PRN
  Administered 2024-07-12: 1000 mg via INTRAVENOUS

## 2024-07-12 MED ORDER — ONDANSETRON HCL 4 MG/2ML IJ SOLN
INTRAMUSCULAR | Status: DC | PRN
  Administered 2024-07-12: 4 mg via INTRAVENOUS

## 2024-07-12 MED ORDER — FENTANYL CITRATE (PF) 100 MCG/2ML IJ SOLN
INTRAMUSCULAR | Status: AC
  Filled 2024-07-12: qty 2

## 2024-07-12 MED ORDER — LACTATED RINGERS IV SOLN: INTRAVENOUS | Status: DC

## 2024-07-12 MED ORDER — OXYCODONE HCL 5 MG PO TABS
5.0000 mg | ORAL_TABLET | Freq: Once | ORAL | Status: AC | PRN
  Administered 2024-07-12: 5 mg via ORAL

## 2024-07-12 MED ORDER — ROCURONIUM BROMIDE 10 MG/ML (PF) SYRINGE
PREFILLED_SYRINGE | INTRAVENOUS | Status: AC
  Filled 2024-07-12: qty 10

## 2024-07-12 MED ORDER — PHENYLEPHRINE 80 MCG/ML (10ML) SYRINGE FOR IV PUSH (FOR BLOOD PRESSURE SUPPORT)
PREFILLED_SYRINGE | INTRAVENOUS | Status: AC
  Filled 2024-07-12: qty 10

## 2024-07-12 MED ORDER — FENTANYL CITRATE (PF) 50 MCG/ML IJ SOSY
PREFILLED_SYRINGE | INTRAMUSCULAR | Status: AC
  Filled 2024-07-12: qty 1

## 2024-07-12 MED ORDER — FENTANYL CITRATE (PF) 50 MCG/ML IJ SOSY
25.0000 ug | PREFILLED_SYRINGE | INTRAMUSCULAR | Status: DC | PRN
  Administered 2024-07-12 (×2): 50 ug via INTRAVENOUS

## 2024-07-12 MED ORDER — SODIUM CHLORIDE 0.9 % IR SOLN
Status: DC | PRN
  Administered 2024-07-12 (×3): 3000 mL

## 2024-07-12 MED ORDER — CHLORHEXIDINE GLUCONATE 0.12 % MT SOLN
15.0000 mL | Freq: Once | OROMUCOSAL | Status: AC
  Administered 2024-07-12: 15 mL via OROMUCOSAL

## 2024-07-12 MED ORDER — ONDANSETRON HCL 4 MG/2ML IJ SOLN
INTRAMUSCULAR | Status: AC
  Filled 2024-07-12: qty 2

## 2024-07-12 MED ORDER — DEXAMETHASONE SOD PHOSPHATE PF 10 MG/ML IJ SOLN
INTRAMUSCULAR | Status: DC | PRN
  Administered 2024-07-12: 5 mg via INTRAVENOUS

## 2024-07-12 MED ORDER — FENTANYL CITRATE (PF) 50 MCG/ML IJ SOSY
PREFILLED_SYRINGE | INTRAMUSCULAR | Status: AC
  Filled 2024-07-12: qty 2

## 2024-07-12 MED ORDER — LIDOCAINE 2% (20 MG/ML) 5 ML SYRINGE: INTRAMUSCULAR | Status: DC | PRN

## 2024-07-12 MED ORDER — PROPOFOL 10 MG/ML IV BOLUS
INTRAVENOUS | Status: DC | PRN
  Administered 2024-07-12: 90 mg via INTRAVENOUS
  Administered 2024-07-12: 20 mg via INTRAVENOUS

## 2024-07-12 SURGICAL SUPPLY — 17 items
BAG URINE DRAIN 2000ML AR STRL (UROLOGICAL SUPPLIES) IMPLANT
BAG URO CATCHER STRL LF (MISCELLANEOUS) ×1 IMPLANT
CATH FOLEY 2WAY SLVR 5CC 18FR (CATHETERS) IMPLANT
CLOTH BEACON ORANGE TIMEOUT ST (SAFETY) ×1 IMPLANT
DRAPE FOOT SWITCH (DRAPES) ×1 IMPLANT
ELECT REM PT RETURN 15FT ADLT (MISCELLANEOUS) IMPLANT
ELECTRODE COAG BIPLR CYL 1.2MM (ELECTROSURGICAL) IMPLANT
GLOVE BIO SURGEON STRL SZ 6.5 (GLOVE) ×1 IMPLANT
GOWN STRL REUS W/ TWL LRG LVL3 (GOWN DISPOSABLE) ×1 IMPLANT
KIT TURNOVER KIT A (KITS) ×1 IMPLANT
LOOP CUT BIPOLAR 24F LRG (ELECTROSURGICAL) IMPLANT
MANIFOLD NEPTUNE II (INSTRUMENTS) ×1 IMPLANT
PACK CYSTO (CUSTOM PROCEDURE TRAY) ×1 IMPLANT
PAD PREP 24X48 CUFFED NSTRL (MISCELLANEOUS) ×1 IMPLANT
SYRINGE TOOMEY IRRIG 70ML (MISCELLANEOUS) IMPLANT
TUBING CONNECTING 10 (TUBING) ×1 IMPLANT
TUBING UROLOGY SET (TUBING) ×1 IMPLANT

## 2024-07-12 NOTE — H&P (Signed)
 cc: Urinary frequency/urgency, urinary incontinence, bladder pain    Cystoscopy with bladder biopsy October 2023  Cysto botox  November 2023, February 2025  CT abd/pelvis Septebmer 2025    05/25/2024: 83 year old woman with a history of overactive bladder with poor response to Botox  with acute onset change in symptoms and pain. CT scan showed thickened bladder consistent with chronic cystitis. Patient had no improvement in symptoms adding the daily antibiotic. She was previously on vaginal estradiol but has stopped it due to breast cancer. Her oncologist said that it was okay to restart this. She is here today for cystoscopy.   07/12/24: Frequency and urgency have worsened.  She has positive urine cytology.  She was scheduled for surgery twice before but cancelled (due to husband medical condition and second time because she was on ASA 325mg .)   Review of systems: A 12 point comprehensive review of systems was obtained and is negative unless otherwise stated in the history of present illness.  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   Depression 12/06/2022   Wound dehiscence 12/06/2022   Neutropenic fever 11/22/2022   UTI (urinary tract infection) 11/22/2022   Tachycardia 11/22/2022   AKI (acute kidney injury) 11/22/2022   Hypokalemia 11/22/2022   Severe neutropenia 11/22/2022   Port-A-Cath in place 11/19/2022   Breast cancer of upper-outer quadrant of right female breast (HCC) 10/14/2022   Hyperlipemia 10/14/2022   Malignant neoplasm of upper-outer quadrant of right breast in female, estrogen receptor positive (HCC) 09/15/2022   History of MI (myocardial infarction) 09/13/2022   BPPV (benign paroxysmal positional vertigo) 05/01/2019   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   Current Outpatient Medications on File Prior to Encounter  Medication Sig Dispense Refill   acetaminophen  (TYLENOL ) 500 MG tablet Take 1,000 mg by mouth at bedtime.     aspirin  325 MG tablet Take 325 mg by mouth daily.     Bempedoic Acid -Ezetimibe  (NEXLIZET ) 180-10 MG TABS Take 1 tablet by mouth daily. 90 tablet 3   cephALEXin  (KEFLEX ) 250 MG capsule Take 250 mg by mouth daily.     citalopram  (CELEXA ) 40 MG tablet Take 40 mg by mouth daily.     estradiol (ESTRACE) 0.1 MG/GM vaginal cream Place 1 Applicatorful vaginally 4 (four) times a week.     fluocinonide gel (LIDEX) 0.05 % Apply 1 Application topically at bedtime.     HUMALOG  KWIKPEN 100 UNIT/ML KwikPen Inject 8-20 Units into the skin with breakfast, with lunch, and with evening meal.     ibuprofen  (ADVIL ) 200 MG tablet Take 600 mg by mouth every 8 (eight) hours as needed (pain.).     metoprolol  tartrate (LOPRESSOR ) 25 MG tablet TAKE 1/2 TABLET TWICE DAILY 60 tablet 0   Misc Natural Products (BLOOD SUGAR BALANCE PO) Take 1 tablet by mouth daily.     Multiple Vitamins-Minerals (MEMORY VITE PO) Take 2 drops by mouth daily. Memocore Supplement     Multiple Vitamins-Minerals (PRESERVISION AREDS) CAPS Take 1 capsule by mouth in the morning and at bedtime.     nitroGLYCERIN  (NITROSTAT ) 0.4 MG SL tablet Place 0.4 mg under the tongue every 5 (five) minutes x 3 doses as needed for chest pain.     ramipril  (ALTACE ) 2.5  MG capsule Take 2.5 mg by mouth daily.     tiZANidine  (ZANAFLEX ) 2 MG tablet Take 2 mg by mouth every 8 (eight) hours as needed for muscle spasms.     TRESIBA  FLEXTOUCH 200 UNIT/ML FlexTouch Pen Inject 32 Units into the skin daily after lunch. We've reduced your insulin  dose, please follow up with your PCP outpatient and adjust this further as needed.     vitamin B-12 (CYANOCOBALAMIN) 1000 MCG tablet Take 1,000 mcg by mouth  daily.     BD AUTOSHIELD DUO 30G X 5 MM MISC      BD PEN NEEDLE NANO U/F 32G X 4 MM MISC SMARTSIG:2 Pen Needle SUB-Q Daily     Continuous Blood Gluc Sensor (FREESTYLE LIBRE 14 DAY SENSOR) MISC Apply topically every 14 (fourteen) days.      Past Medical History:  Diagnosis Date   Allergic rhinitis, seasonal    Anemia    Arthritis    hands   Asthma    Years ago due to a cat. 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    GERD (gastroesophageal reflux disease)    History of benign neoplasm of tongue    per pt has had several bx's of lesions   History of cancer chemotherapy 1997   right breast cancer   History of external beam radiation therapy 1997   right breast cancer   History of iron deficiency anemia    History of non-ST elevation myocardial infarction (NSTEMI) 12/14/2008   History of right breast cancer 1997   s/p  right partial masectomy node dissection's,  completed chemo and radiation same year ,   no recurrence since   Hyperlipidemia    Mood disorder    Myocardial infarction (HCC) 2010   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   OAB (overactive bladder)    OSA (obstructive sleep apnea) 2016   sleep study in epic 10-04-2014, AHI 22.7/ hr;  (06-17-2022  pt stated has not used dental appliance in few years, does not fit )   Peripheral neuropathy    feet   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/2008   x2  to proximal and  mid LAD   Urinary incontinence, mixed    urologist--- dr Kodie Kishi   Wears glasses    Wears hearing aid in both ears     Past Surgical History:  Procedure Laterality Date   BOTOX  INJECTION N/A 06/22/2022   Procedure: ABORTED BOTOX  INJECTION 75 UNITS;  Surgeon: Elisabeth Valli BIRCH, MD;  Location: [facility] [place];  Service: Urology;  Laterality: N/A;   BOTOX  INJECTION N/A 07/20/2022   Procedure: BOTOX  INJECTION;  Surgeon: Elisabeth Valli BIRCH, MD;  Location: [facility];  Service: Urology;  Laterality: N/A;   BOTOX  INJECTION N/A 10/20/2023   Procedure: CYSTOSCOPY WITH 100 UNITS OF BOTOX  INJECTION;  Surgeon: Elisabeth Valli BIRCH, MD;  Location: [facility];  Service: Urology;  Laterality: N/A;  30 MINUTES NEEDED FOR CASE  100 UNITS OF BOTOX    BREAST BIOPSY Right 08/27/2022   US  RT BREAST BX W LOC DEV 1ST LESION IMG BX SPEC US  GUIDE 08/27/2022 [facility]   CATARACT EXTRACTION W/ INTRAOCULAR LENS IMPLANT Bilateral 2014   CORONARY ANGIOPLASTY WITH STENT PLACEMENT  12/16/2008   @[facility]   by dr berry;   PCI and stenting to proximal and mid LAD (DES) w/ residual D1 80%  nonobstructive disease involving RCA/ CFx,  ef 50%   CYSTOSCOPY N/A 06/22/2022   Procedure: CYSTOSCOPY, BLADDER BIOPSY, AND FULGERATION;  Surgeon: Elisabeth Valli BIRCH, MD;  Location: [facility];  Service: Urology;  Laterality: N/A;  30 MINS   CYSTOSCOPY N/A 07/20/2022   Procedure: CYSTOSCOPY, BLADDER FULGERATION;  Surgeon: Elisabeth Valli BIRCH, MD;  Location: [facility];  Service: Urology;  Laterality: N/A;  30 MINS   CYSTOSCOPY N/A 10/20/2023   Procedure: CYSTOSCOPY;  Surgeon: Elisabeth Valli BIRCH, MD;  Location: [facility];  Service: Urology;  Laterality: N/A;   EXCISION OF BREAST BIOPSY Right 1995   IR CV LINE INJECTION  04/21/2023   IR IMAGING GUIDED PORT INSERTION  05/04/2023   IR REMOVAL TUN ACCESS W/ PORT W/O FL MOD SED  05/04/2023   IR REMOVAL TUN ACCESS W/ PORT W/O FL MOD SED  08/26/2023    MASTECTOMY PARTIAL / LUMPECTOMY W/ AXILLARY LYMPHADENECTOMY Right 1997   PORTACATH PLACEMENT N/A 10/14/2022   Procedure: PORT PLACEMENT;  Surgeon: Aron Shoulders, MD;  Location: [facility];  Service: General;  Laterality: N/A;   TOTAL MASTECTOMY Right 10/14/2022   Procedure: RIGHT MASTECTOMY;  Surgeon: Aron Shoulders, MD;  Location: [facility];  Service: General;  Laterality: Right;  PEC BLOCK   TUBAL LIGATION Bilateral 1977    Social History   Tobacco Use   Smoking status: Former    Current packs/day: 0.00    Average packs/day: 1 pack/day for 20.0 years (20.0 ttl pk-yrs)    Types: Cigarettes    Start date: 52    Quit date: 49    Years since quitting: 34.8    Passive exposure: Never   Smokeless tobacco: Never  Vaping Use   Vaping status: Never Used  Substance Use Topics   Alcohol  use: Yes    Alcohol /week: 7.0 standard drinks of alcohol     Types: 7 Glasses of wine per week    Comment: Wine prior to dinner   Drug use: Never    Family History  Problem Relation Age of Onset   Osteoporosis Mother    Breast cancer Mother 25   Heart Problems Mother    Other Father        MI   CVA Father    Asthma Father    Diabetes Mellitus II Father    Hypertension Sister    Ovarian cancer Sister 92    PE: Vitals:   07/12/24 0646 07/12/24 0647  BP: 134/67   Pulse: 80   Resp: 16   Temp: 98 F (36.7 C)   TempSrc: Oral   SpO2: 100%   Weight:  66.7 kg  Height:  5' 7 (1.702 m)   Patient appears to be in no acute distress  patient is alert and oriented x3 Atraumatic normocephalic head No increased work of breathing, no audible wheezes/rhonchi Lower extremities are symmetric without appreciable edema Grossly neurologically intact No identifiable skin lesions  No results for  input(s): WBC, HGB, HCT in the last 72 hours. No results for input(s): NA, K, CL, CO2, GLUCOSE, BUN, CREATININE, CALCIUM in the last 72 hours. No results for input(s): LABPT, INR in the last 72  hours. No results for input(s): LABURIN in the last 72 hours. Results for orders placed or performed during the hospital encounter of 11/21/22  Culture, blood (Routine x 2)     Status: None   Collection Time: 11/22/22 12:58 AM   Specimen: BLOOD  Result Value Ref Range Status   Specimen Description   Final    BLOOD LA Performed at [facility], [address] [place], [place] [address]    Special Requests   Final    BOTTLES DRAWN AEROBIC AND ANAEROBIC Blood Culture results may not be optimal due to an excessive volume of blood received in culture bottles Performed at [facility], [address] [place], [place] [address]    Culture   Final    NO GROWTH 5 DAYS Performed at [facility], [address] [place], [place] [address]    Report Status 11/27/2022 FINAL  Final  Culture, blood (Routine x 2)     Status: None   Collection Time: 11/22/22 12:58 AM   Specimen: BLOOD  Result Value Ref Range Status   Specimen Description   Final    BLOOD BLOOD LEFT HAND Performed at [facility], [address], [place], [place] [address]    Special Requests   Final    BOTTLES DRAWN AEROBIC AND ANAEROBIC Blood Culture results may not be optimal due to an excessive volume of blood received in culture bottles Performed at [facility], [address], [place], [place] [address]    Culture   Final    NO GROWTH 5 DAYS Performed at [facility], [address], [place], [place] [address]    Report Status 11/27/2022 FINAL  Final  Resp panel by RT-PCR (RSV, Flu A&B, Covid)     Status: None   Collection Time: 11/22/22  1:20 AM   Specimen: Nasal Swab  Result Value Ref Range Status   SARS Coronavirus 2 by RT PCR NEGATIVE NEGATIVE Final    Comment: (NOTE) SARS-CoV-2 target nucleic acids are NOT DETECTED.  The SARS-CoV-2 RNA is generally detectable in upper respiratory specimens during the acute phase of  infection. The lowest concentration of SARS-CoV-2 viral copies this assay can detect is 138 copies/mL. A negative result does not preclude SARS-Cov-2 infection and should not be used as the sole basis for treatment or other patient management decisions. A negative result may occur with  improper specimen collection/handling, submission of specimen other than nasopharyngeal swab, presence of viral mutation(s) within the areas targeted by this assay, and inadequate number of viral copies(<138 copies/mL). A negative result must be combined with clinical observations, patient history, and epidemiological information. The expected result is Negative.  Fact Sheet for Patients:  bloggercourse.com  Fact Sheet for Healthcare Providers:  seriousbroker.it  This test is no t yet approved or cleared by the [place]  FDA and  has been authorized for detection and/or diagnosis of SARS-CoV-2 by FDA under an Emergency Use Authorization (EUA). This EUA will remain  in effect (meaning this test can be used) for the duration of the COVID-19 declaration under Section 564(b)(1) of the Act, 21 U.S.C.section 360bbb-3(b)(1), unless the authorization is terminated  or revoked sooner.       Influenza A by PCR  NEGATIVE NEGATIVE Final   Influenza B by PCR NEGATIVE NEGATIVE Final    Comment: (NOTE) The Xpert Xpress SARS-CoV-2/FLU/RSV plus assay is intended as an aid in the diagnosis of influenza from Nasopharyngeal swab specimens and should not be used as a sole basis for treatment. Nasal washings and aspirates are unacceptable for Xpert Xpress SARS-CoV-2/FLU/RSV testing.  Fact Sheet for Patients: bloggercourse.com  Fact Sheet for Healthcare Providers: seriousbroker.it  This test is not yet approved or cleared by the [place]  FDA and has been authorized for detection and/or diagnosis of SARS-CoV-2  by FDA under an Emergency Use Authorization (EUA). This EUA will remain in effect (meaning this test can be used) for the duration of the COVID-19 declaration under Section 564(b)(1) of the Act, 21 U.S.C. section 360bbb-3(b)(1), unless the authorization is terminated or revoked.     Resp Syncytial Virus by PCR NEGATIVE NEGATIVE Final    Comment: (NOTE) Fact Sheet for Patients: bloggercourse.com  Fact Sheet for Healthcare Providers: seriousbroker.it  This test is not yet approved or cleared by the [place]  FDA and has been authorized for detection and/or diagnosis of SARS-CoV-2 by FDA under an Emergency Use Authorization (EUA). This EUA will remain in effect (meaning this test can be used) for the duration of the COVID-19 declaration under Section 564(b)(1) of the Act, 21 U.S.C. section 360bbb-3(b)(1), unless the authorization is terminated or revoked.  Performed at [facility], [address], [place], [place] [address]   MRSA Next Gen by PCR, Nasal     Status: Abnormal   Collection Time: 11/22/22  5:52 PM   Specimen: Nasal Mucosa; Nasal Swab  Result Value Ref Range Status   MRSA by PCR Next Gen DETECTED (A) NOT DETECTED Final    Comment: RESULT CALLED TO, READ BACK BY AND VERIFIED WITH: rn ammi l at 1940 11/22/22 cruickshank a (NOTE) The GeneXpert MRSA Assay (FDA approved for NASAL specimens only), is one component of a comprehensive MRSA colonization surveillance program. It is not intended to diagnose MRSA infection nor to guide or monitor treatment for MRSA infections. Test performance is not FDA approved in patients less than 65 years old. Performed at [facility], [address], [place], [place] [address]      A/P: Bladder pain/abnormal bladder mucosa - Risks and benefits again reviewed with patient including bleeding, pain, infection, damage surrounding structures,  bladder perforation, need for Foley catheter -Discussed the possibility of finding malignancy based on positive urine cytology - Plan on patient going home with Foley catheter due to severity of symptoms - Cystoscopy, bladder biopsy and fulguration today in the OR.  Informed consent obtained.    Moyinoluwa Dawe D Shaquaya Wuellner

## 2024-07-12 NOTE — Transfer of Care (Signed)
 Immediate Anesthesia Transfer of Care Note  Patient: Karina Foster  Procedure(s) Performed: TURBT (TRANSURETHRAL RESECTION OF BLADDER TUMOR) (Bladder)  Patient Location: PACU  Anesthesia Type:General  Level of Consciousness: awake, alert , oriented, and patient cooperative  Airway & Oxygen  Therapy: Patient Spontanous Breathing and Patient connected to face mask oxygen   Post-op Assessment: Report given to RN and Post -op Vital signs reviewed and stable  Post vital signs: Reviewed and stable  Last Vitals:  Vitals Value Taken Time  BP 187/96 07/12/24 09:06  Temp    Pulse 83 07/12/24 09:08  Resp 9 07/12/24 09:08  SpO2 100 % 07/12/24 09:08  Vitals shown include unfiled device data.  Last Pain:  Vitals:   07/12/24 0647  TempSrc:   PainSc: 5       Patients Stated Pain Goal: 4 (07/12/24 9352)  Complications: No notable events documented.

## 2024-07-12 NOTE — Anesthesia Procedure Notes (Signed)
 Procedure Name: Intubation Date/Time: 07/12/2024 8:13 AM  Performed by: Franchot Delon RAMAN, CRNAPre-anesthesia Checklist: Patient identified, Emergency Drugs available, Suction available and Patient being monitored Patient Re-evaluated:Patient Re-evaluated prior to induction Oxygen  Delivery Method: Circle System Utilized Preoxygenation: Pre-oxygenation with 100% oxygen  Induction Type: IV induction Ventilation: Mask ventilation without difficulty Laryngoscope Size: Mac and 3 Grade View: Grade II Tube type: Oral Tube size: 7.0 mm Number of attempts: 1 Airway Equipment and Method: Bougie stylet Placement Confirmation: ETT inserted through vocal cords under direct vision, positive ETCO2 and breath sounds checked- equal and bilateral Secured at: 22 cm Tube secured with: Tape Dental Injury: Teeth and Oropharynx as per pre-operative assessment

## 2024-07-12 NOTE — Op Note (Signed)
 Operative Note  Preoperative diagnosis:  1.  Positive urine cytology 2.  Abnormal bladder mucosa 3.  Bladder pain  Postoperative diagnosis: Same  Procedure(s): 1.  Cystoscopy with bladder biopsy 2.  Fulguration of abnormal bladder mucosa > 5 cm  Surgeon: Valli Shank, MD  Assistants:  None  Anesthesia:  General  Complications:  None  EBL:  minimal  Specimens: 1. Bladder biopsy  Drains/Catheters: 1.  18Fr foley catheter  Intraoperative findings:   Erythematous dry skin of vulva Normal urethra Bilateral orthotopic ureteral orifices Patchy irregular erythematous bladder mucosa, friable and bleeds easily with scope trauma.  Bladder wall very thin. Bladder biopsy taken from left lateral wall Extensive fulguration greater than 5 cm  Indication:  Karina Foster is a 83 y.o. female with urinary frequency and urgency previously managed with Botox .  Recent cystoscopy showed worsening erythematous lesions.  She has had previous biopsy before that was negative for malignancy however urine cytology this time was positive.  Description of procedure:  After risks and benefits of the procedure discussed with the patient, informed consent was obtained.  The patient is taken to the operating placed in supine position.  Anesthesia was induced and antibiotics were administered.  She was then repositioned in the dorsolithotomy position.  She was prepped and draped in the usual sterile fashion and timeout was performed.  A 21 Brees rigid cystoscope was placed in the urethral meatus and advanced into the bladder under direct visualization.  Findings are noted above.  The rigid biopsy forceps were then used to take a biopsy from the left posterior lateral wall taking care to avoid the ureteral orifices.  The cystoscope was removed and using the visual obturator the resectoscope was advanced.  The visual obturator was removed and the bipolar cylinder was then assembled with the working element.   Extensive fulguration took place on the posterior, right and left lateral superior wall.  Fulguration continued show there is no residual abnormal/erythematous areas and hemostasis was adequate with the irrigant turned off.  Resectoscope was removed.   An 52 Self Foley catheter was then inserted and completely continue sterile water .  Catheter was connected to gravity drainage.  The patient emerged from general anesthesia and was transferred to the PACU in stable condition.   Plan: Follow-up in the office for void trial in 1 week

## 2024-07-12 NOTE — Discharge Instructions (Addendum)
 Bladder biopsy with fulguration   Definition:  Transurethral Resection of the Bladder Tumor is a surgical procedure used to diagnose and remove tumors within the bladder. TURBT is the most common treatment for early stage bladder cancer.  General instructions:     Your recent bladder surgery requires very little post hospital care but some definite precautions.  Despite the fact that no skin incisions were used, the area around the bladder incisions are raw and covered with scabs to promote healing and prevent bleeding. Certain precautions are needed to insure that the scabs are not disturbed over the next 2-4 weeks while the healing proceeds.  Because the raw surface inside your bladder and the irritating effects of urine you may expect frequency of urination and/or urgency (a stronger desire to urinate) and perhaps even getting up at night more often. This will usually resolve or improve slowly over the healing period. You may see some blood in your urine over the first 6 weeks. Do not be alarmed, even if the urine was clear for a while. Get off your feet and drink lots of fluids until clearing occurs. If you start to pass clots or don't improve call us .  Catheter: (If you are discharged with a catheter.)  1. Keep your catheter secured to your leg at all times with tape or the supplied strap. 2. You may experience leakage of urine around your catheter- as long as the  catheter continues to drain, this is normal.  If your catheter stops draining  go to the ER. 3. You may also have blood in your urine, even after it has been clear for  several days; you may even pass some small blood clots or other material.  This  is normal as well.  If this happens, sit down and drink plenty of water  to help  make urine to flush out your bladder.  If the blood in your urine becomes worse  after doing this, contact our office or return to the ER. 4. You may use the leg bag (small bag) during the day, but  use the large bag at  night.  Diet:  You may return to your normal diet immediately. Because of the raw surface of your bladder, alcohol , spicy foods, foods high in acid and drinks with caffeine may cause irritation or frequency and should be used in moderation. To keep your urine flowing freely and avoid constipation, drink plenty of fluids during the day (8-10 glasses). Tip: Avoid cranberry juice because it is very acidic.  Activity:  Your physical activity doesn't need to be restricted. However, if you are very active, you may see some blood in the urine. We suggest that you reduce your activity under the circumstances until the bleeding has stopped.  Bowels:  It is important to keep your bowels regular during the postoperative period. Straining with bowel movements can cause bleeding. A bowel movement every other day is reasonable. Use a mild laxative if needed, such as milk of magnesia 2-3 tablespoons, or 2 Dulcolax tablets. Call if you continue to have problems. If you had been taking narcotics for pain, before, during or after your surgery, you may be constipated. Take a laxative if necessary.    Medication:  You should resume your pre-surgery medications unless told not to. In addition you may be given an antibiotic to prevent or treat infection. Antibiotics are not always necessary. All medication should be taken as prescribed until the bottles are finished unless you are having an unusual  reaction to one of the drugs.   **Do not restart Aspirin  325mg  until urine is completely clear**

## 2024-07-12 NOTE — Anesthesia Postprocedure Evaluation (Signed)
 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  Last Vitals:  Vitals:   07/12/24 0945 07/12/24 1000  BP: 137/66 (!) 141/66  Pulse: 80 76  Resp: 13 12  Temp:    SpO2: 93% 99%    Last Pain:  Vitals:   07/12/24 1000  TempSrc:   PainSc: 5                  Lynwood MARLA Cornea

## 2024-07-12 NOTE — Anesthesia Preprocedure Evaluation (Signed)
 Anesthesia Evaluation  Patient identified by MRN, date of birth, ID band Patient awake    Reviewed: Allergy & Precautions, NPO status , Patient's Chart, lab work & pertinent test results, reviewed documented beta blocker date and time   History of Anesthesia Complications Negative for: history of anesthetic complications  Airway Mallampati: III  TM Distance: >3 FB     Dental no notable dental hx.    Pulmonary shortness of breath, asthma , sleep apnea , neg COPD, former smoker   breath sounds clear to auscultation       Cardiovascular hypertension, (-) angina + CAD, + Past MI and + Cardiac Stents  (-) CHF and (-) Orthopnea  Rhythm:Regular Rate:Normal  Exercise nuclear stress test was performed using Bruce protocol.   1 Day Rest and Stress images. Exercise time 3 minutes 33 seconds on Bruce protocol, achieved 5.15 METS, 88 % of APMHR. Hypertensive response to exercise: No (rest 150/72, peak 180/80) Stress ECG negative for ischemia. Small size, mild intensity, reversible perfusion defect involving the apical lateral segment, cannot entirely rule out mild ischemia in either the distal RCA/LCx distribution.  Remainder of the myocardial segments illustrate homogenous tracer uptake without evidence of prior infarct. Calculated LVEF 67%, wall thickness and wall motion preserved. Prior Lexiscan  dated 05/23/2017 reported normal perfusion with calculated LVEF 47%. Low risk study.    Neuro/Psych neg Seizures PSYCHIATRIC DISORDERS  Depression   Dementia  Neuromuscular disease    GI/Hepatic ,GERD  ,,  Endo/Other  diabetes, Type 2    Renal/GU Renal disease     Musculoskeletal  (+) Arthritis , Osteoarthritis,    Abdominal   Peds  Hematology  (+) Blood dyscrasia, anemia   Anesthesia Other Findings   Reproductive/Obstetrics                              Anesthesia Physical Anesthesia Plan  ASA:  2  Anesthesia Plan: General   Post-op Pain Management:    Induction: Intravenous  PONV Risk Score and Plan: 2 and Ondansetron  and Dexamethasone   Airway Management Planned: Oral ETT  Additional Equipment:   Intra-op Plan:   Post-operative Plan:   Informed Consent: I have reviewed the patients History and Physical, chart, labs and discussed the procedure including the risks, benefits and alternatives for the proposed anesthesia with the patient or authorized representative who has indicated his/her understanding and acceptance.     Dental advisory given  Plan Discussed with: CRNA  Anesthesia Plan Comments:          Anesthesia Quick Evaluation

## 2024-07-13 ENCOUNTER — Encounter (HOSPITAL_COMMUNITY): Payer: Self-pay | Admitting: Urology

## 2024-07-16 LAB — SURGICAL PATHOLOGY

## 2024-07-20 DIAGNOSIS — N3281 Overactive bladder: Secondary | ICD-10-CM | POA: Diagnosis not present

## 2024-07-20 DIAGNOSIS — N3020 Other chronic cystitis without hematuria: Secondary | ICD-10-CM | POA: Diagnosis not present

## 2024-07-20 DIAGNOSIS — N3946 Mixed incontinence: Secondary | ICD-10-CM | POA: Diagnosis not present

## 2025-05-09 ENCOUNTER — Ambulatory Visit: Admitting: Hematology and Oncology
